# Patient Record
Sex: Male | Born: 1965 | Race: Black or African American | Hispanic: No | Marital: Married | Smoking: Current every day smoker
Health system: Southern US, Community
[De-identification: ages and names within clinical notes are randomized; demographics above are authoritative.]

## PROBLEM LIST (undated history)

## (undated) DIAGNOSIS — E7800 Pure hypercholesterolemia, unspecified: Secondary | ICD-10-CM

## (undated) DIAGNOSIS — M87059 Idiopathic aseptic necrosis of unspecified femur: Secondary | ICD-10-CM

## (undated) DIAGNOSIS — R0602 Shortness of breath: Secondary | ICD-10-CM

## (undated) DIAGNOSIS — M069 Rheumatoid arthritis, unspecified: Secondary | ICD-10-CM

## (undated) DIAGNOSIS — I319 Disease of pericardium, unspecified: Secondary | ICD-10-CM

## (undated) DIAGNOSIS — I10 Essential (primary) hypertension: Secondary | ICD-10-CM

## (undated) DIAGNOSIS — A419 Sepsis, unspecified organism: Secondary | ICD-10-CM

## (undated) DIAGNOSIS — J189 Pneumonia, unspecified organism: Secondary | ICD-10-CM

## (undated) HISTORY — DX: Sepsis, unspecified organism: A41.9

## (undated) HISTORY — PX: APPENDECTOMY: SHX54

## (undated) HISTORY — DX: Disease of pericardium, unspecified: I31.9

## (undated) HISTORY — DX: Pure hypercholesterolemia, unspecified: E78.00

## (undated) HISTORY — PX: JOINT REPLACEMENT: SHX530

## (undated) HISTORY — PX: TONSILLECTOMY: SUR1361

## (undated) HISTORY — PX: BACK SURGERY: SHX140

---

## 1999-03-14 ENCOUNTER — Encounter: Payer: Self-pay | Admitting: Emergency Medicine

## 1999-03-14 ENCOUNTER — Emergency Department (HOSPITAL_COMMUNITY): Admission: EM | Admit: 1999-03-14 | Discharge: 1999-03-14 | Payer: Self-pay | Admitting: Emergency Medicine

## 2005-04-05 ENCOUNTER — Encounter: Admission: RE | Admit: 2005-04-05 | Discharge: 2005-04-05 | Payer: Self-pay | Admitting: Occupational Medicine

## 2005-06-29 ENCOUNTER — Emergency Department (HOSPITAL_COMMUNITY): Admission: EM | Admit: 2005-06-29 | Discharge: 2005-06-29 | Payer: Self-pay | Admitting: Family Medicine

## 2006-05-28 ENCOUNTER — Emergency Department (HOSPITAL_COMMUNITY): Admission: EM | Admit: 2006-05-28 | Discharge: 2006-05-28 | Payer: Self-pay | Admitting: Emergency Medicine

## 2006-09-07 ENCOUNTER — Emergency Department (HOSPITAL_COMMUNITY): Admission: EM | Admit: 2006-09-07 | Discharge: 2006-09-07 | Payer: Self-pay | Admitting: Family Medicine

## 2006-11-30 ENCOUNTER — Emergency Department (HOSPITAL_COMMUNITY): Admission: EM | Admit: 2006-11-30 | Discharge: 2006-11-30 | Payer: Self-pay | Admitting: Family Medicine

## 2007-01-01 ENCOUNTER — Emergency Department (HOSPITAL_COMMUNITY): Admission: EM | Admit: 2007-01-01 | Discharge: 2007-01-01 | Payer: Self-pay | Admitting: *Deleted

## 2007-05-11 ENCOUNTER — Encounter: Admission: RE | Admit: 2007-05-11 | Discharge: 2007-06-19 | Payer: Self-pay | Admitting: Occupational Medicine

## 2007-11-14 ENCOUNTER — Emergency Department (HOSPITAL_COMMUNITY): Admission: EM | Admit: 2007-11-14 | Discharge: 2007-11-14 | Payer: Self-pay | Admitting: Emergency Medicine

## 2007-12-25 ENCOUNTER — Emergency Department (HOSPITAL_COMMUNITY): Admission: EM | Admit: 2007-12-25 | Discharge: 2007-12-25 | Payer: Self-pay | Admitting: Emergency Medicine

## 2007-12-29 ENCOUNTER — Emergency Department (HOSPITAL_COMMUNITY): Admission: EM | Admit: 2007-12-29 | Discharge: 2007-12-29 | Payer: Self-pay | Admitting: Emergency Medicine

## 2008-04-10 ENCOUNTER — Emergency Department (HOSPITAL_COMMUNITY): Admission: EM | Admit: 2008-04-10 | Discharge: 2008-04-10 | Payer: Self-pay | Admitting: Emergency Medicine

## 2008-08-12 ENCOUNTER — Emergency Department (HOSPITAL_COMMUNITY): Admission: EM | Admit: 2008-08-12 | Discharge: 2008-08-12 | Payer: Self-pay | Admitting: Emergency Medicine

## 2008-11-22 HISTORY — PX: CARDIAC CATHETERIZATION: SHX172

## 2008-12-15 ENCOUNTER — Ambulatory Visit (HOSPITAL_COMMUNITY): Admission: RE | Admit: 2008-12-15 | Discharge: 2008-12-15 | Payer: Self-pay | Admitting: Cardiovascular Disease

## 2009-05-05 ENCOUNTER — Emergency Department (HOSPITAL_COMMUNITY): Admission: EM | Admit: 2009-05-05 | Discharge: 2009-05-05 | Payer: Self-pay | Admitting: Emergency Medicine

## 2009-08-29 ENCOUNTER — Emergency Department (HOSPITAL_COMMUNITY): Admission: EM | Admit: 2009-08-29 | Discharge: 2009-08-29 | Payer: Self-pay | Admitting: Emergency Medicine

## 2009-12-17 ENCOUNTER — Emergency Department (HOSPITAL_COMMUNITY): Admission: EM | Admit: 2009-12-17 | Discharge: 2009-12-17 | Payer: Self-pay | Admitting: Emergency Medicine

## 2010-10-13 LAB — RAPID STREP SCREEN (MED CTR MEBANE ONLY): Streptococcus, Group A Screen (Direct): NEGATIVE

## 2010-10-25 ENCOUNTER — Encounter: Payer: Self-pay | Admitting: Cardiovascular Disease

## 2010-10-26 ENCOUNTER — Ambulatory Visit: Payer: Self-pay | Admitting: Cardiovascular Disease

## 2010-12-07 NOTE — Cardiovascular Report (Signed)
NAMEMarland Kitchen  AZIM, GILLINGHAM NO.:  [id number]   MEDICAL RECORD NO.:  [id number]          PATIENT TYPE:  OIB   LOCATION:  2899                         FACILITY:  [facility]   PHYSICIAN:  Vesta Mixer, M.D. DATE OF BIRTH:  [birth year]   DATE OF PROCEDURE:  12/15/2008  DATE OF DISCHARGE:  12/15/2008                            CARDIAC CATHETERIZATION   Dennis Harris is a 45 year old gentleman with a history of pericarditis.  He  presents with symptoms of chest pain consistent with unstable angina.  Because of his persistent chest pains, he is scheduled for heart  catheterization.   The procedure was left heart catheterization with coronary angiography.   The right femoral artery was easily cannulated using a modified  Seldinger technique.   HEMODYNAMICS:  LV pressure is 123/2 with an aortic pressure of 127/75.   ANGIOGRAPHY:  Left main.  The left main is smooth and normal.   The left descending artery is smooth and normal.  There is a large first  diagonal artery which is normal.  There are several small diagonal  arteries which are normal.   The left circumflex artery is a moderate-sized branch.  It gives off two  high obtuse marginal arteries which are normal.  The distal circumflex  artery is normal.   The right coronary artery is moderate in size.  There is a mid 10-20%  irregularity.  The remaining RCA is normal.  The posterior descending  artery and the posterolateral segment artery are normal.   The left ventriculogram was performed in the 30-degree RAO position.  It  reveals normal left ventricular systolic function.  There are no  segmental wall motion abnormalities.   COMPLICATIONS:  None.   CONCLUSIONS:  1. Minimal coronary artery disease.  2. Normal left ventricular systolic function.  We will continue with      medical therapy.  We will try him on some Prevacid.      Vesta Mixer, M.D.  Electronically Signed     PJN/MEDQ  D:  12/15/2008  T:   12/16/2008  Job:  [id number]

## 2010-12-07 NOTE — H&P (Signed)
NAME:  JOSEF, TOURIGNY NO.:  [id number]   MEDICAL RECORD NO.:  [id number]          PATIENT TYPE:  OIB   LOCATION:                               FACILITY:  [facility]   PHYSICIAN:  Vesta Mixer, M.D. DATE OF BIRTH:  [birth year]   DATE OF ADMISSION:  12/15/2008  DATE OF DISCHARGE:                              HISTORY & PHYSICAL   Dennis Harris is a 45 year old gentleman with a history of chest  pains.  He was diagnosed as having pericarditis a month or so ago.  He  presents now with continued chest pain and is now referred for heart  catheterization for further symptoms.   Dennis Harris is a relatively healthy white gentleman.  He was seen several  months ago and was diagnosed to have pericarditis.  Initially, he had  relief with nonsteroidal anti-inflammatory agents.  Starting about a  week ago, he developed recurrent episodes of chest pain.  These are  described as a chest pressure.  It radiates through to his back.  It is  associated with some nausea and some shortness of breath.  There is no  diaphoresis.  He denies any syncope or presyncope.  He denies any PND or  orthopnea.  He just states that he is just not feeling well at all.  He  has not been able to work all that much because of this chest pain.   The patient denies any blood in his urine, blood in his stool.  He  denies any indigestion-like symptoms.  There is no association with  eating, drinking, change in position, taking a deep breath, or exercise.  Specifically, he denies any pleuritic component to this chest pain.   CURRENT MEDICATIONS:  1. Multivitamin once a day.  2. Aleve as needed.  3. Fish oil once a day.   ALLERGIES:  None.   PAST MEDICAL HISTORY:  1. Chest pain - diagnosed as pericarditis a month or so ago.  2. Hypercholesterolemia.   FAMILY HISTORY:  His father died in 34s due to cirrhosis.  His mother is  39, is old, and has a history of CVA and hypertension as well as  diabetes  mellitus.   SOCIAL HISTORY:  The patient smokes about a pack of cigarettes a day.  He does not drink alcohol.   His review of systems is noted in the HPI.  All other systems were  reviewed and are negative.   PHYSICAL EXAMINATION:  GENERAL:  He is a middle-aged gentleman in no  acute distress.  He is alert and oriented x3, and his mood and affect  are normal.  VITAL SIGNS:  His weight is 166.  His blood pressure 144/84 with a heart  rate of 80.  HEENT:  His sclerae are nonicteric.  His mucous membranes are moist.  His pupils are equally round and reactive to light.  His carotids are 2+  without bruits.  There is no JVD.  His lungs are clear.  NECK:  Supple.  HEART:  Regular rate.  S1 and S2.  His PMI is nondisplaced.  There are  no murmurs.  ABDOMEN:  Good bowel sounds.  There is no hepatosplenomegaly.  There is  no guarding or rebound.  There is no tenderness.  EXTREMITIES:  He has 2+ pulses.  There is no edema.  There are no  rashes.  There are no palpable cords.  His gait is normal.  NEUROLOGIC:  Cranial nerves II through XII are intact, and his motor and  sensory function are intact.   Laboratory data was drawn in order to do the heart catheterization.  These results are pending.   Most recent echo reveals normal left ventricular function.  There is no  evidence of pericardial thickening, and there is no pericardial  effusion.   IMPRESSION:  Dennis Harris presents with recurrent episodes of chest pain,  shortness of breath with radiation through to his back.  These symptoms  are somewhat concerning for unstable angina.  We have scheduled him for  a heart catheterization on Monday.  I have given him a prescription for  nitroglycerin which he can take on an as-needed basis.  I have told him  to call the ER or come to the ER emergently if he has recurrent chest  pains that do not resolved.  We will see him back in the office in a  week or so following his cath.      Vesta Mixer, M.D.  Electronically Signed     PJN/MEDQ  D:  12/12/2008  T:  12/13/2008  Job:  [id number]

## 2010-12-23 ENCOUNTER — Ambulatory Visit: Payer: Self-pay | Admitting: Cardiovascular Disease

## 2010-12-25 ENCOUNTER — Emergency Department (HOSPITAL_COMMUNITY)
Admission: EM | Admit: 2010-12-25 | Discharge: 2010-12-25 | Disposition: A | Discharge status: Home / Self Care | Payer: Managed Care, Other (non HMO) | Attending: Emergency Medicine | Admitting: Emergency Medicine

## 2010-12-25 DIAGNOSIS — M719 Bursopathy, unspecified: Secondary | ICD-10-CM | POA: Insufficient documentation

## 2010-12-25 DIAGNOSIS — M25519 Pain in unspecified shoulder: Secondary | ICD-10-CM | POA: Insufficient documentation

## 2010-12-25 DIAGNOSIS — M67919 Unspecified disorder of synovium and tendon, unspecified shoulder: Secondary | ICD-10-CM | POA: Insufficient documentation

## 2011-03-27 ENCOUNTER — Emergency Department (HOSPITAL_COMMUNITY)
Admission: EM | Admit: 2011-03-27 | Discharge: 2011-03-27 | Disposition: A | Discharge status: Home / Self Care | Payer: Managed Care, Other (non HMO) | Attending: Emergency Medicine | Admitting: Emergency Medicine

## 2011-03-27 DIAGNOSIS — M25549 Pain in joints of unspecified hand: Secondary | ICD-10-CM | POA: Insufficient documentation

## 2011-03-27 DIAGNOSIS — M25569 Pain in unspecified knee: Secondary | ICD-10-CM | POA: Insufficient documentation

## 2011-04-11 ENCOUNTER — Telehealth: Payer: Self-pay | Admitting: Cardiovascular Disease

## 2011-04-11 NOTE — Telephone Encounter (Signed)
Faxed over last OV Notes as well as EKG's, ECHO's, and Stress Tests done at [facility].  Faxed all available info today.

## 2011-04-11 NOTE — Telephone Encounter (Signed)
Received Auth to Release Med Rec Info to [employer].  Please fax available records for past 3 years.

## 2012-01-03 ENCOUNTER — Encounter (HOSPITAL_COMMUNITY): Payer: Self-pay | Admitting: Emergency Medicine

## 2012-01-03 ENCOUNTER — Emergency Department (HOSPITAL_COMMUNITY): Payer: Managed Care, Other (non HMO)

## 2012-01-03 ENCOUNTER — Emergency Department (HOSPITAL_COMMUNITY)
Admission: EM | Admit: 2012-01-03 | Discharge: 2012-01-04 | Disposition: A | Discharge status: Home / Self Care | Payer: Managed Care, Other (non HMO) | Attending: Emergency Medicine | Admitting: Emergency Medicine

## 2012-01-03 DIAGNOSIS — R209 Unspecified disturbances of skin sensation: Secondary | ICD-10-CM | POA: Insufficient documentation

## 2012-01-03 DIAGNOSIS — R590 Localized enlarged lymph nodes: Secondary | ICD-10-CM

## 2012-01-03 DIAGNOSIS — R42 Dizziness and giddiness: Secondary | ICD-10-CM | POA: Insufficient documentation

## 2012-01-03 DIAGNOSIS — M25519 Pain in unspecified shoulder: Secondary | ICD-10-CM | POA: Insufficient documentation

## 2012-01-03 DIAGNOSIS — M25511 Pain in right shoulder: Secondary | ICD-10-CM

## 2012-01-03 DIAGNOSIS — E7800 Pure hypercholesterolemia, unspecified: Secondary | ICD-10-CM | POA: Insufficient documentation

## 2012-01-03 DIAGNOSIS — R0989 Other specified symptoms and signs involving the circulatory and respiratory systems: Secondary | ICD-10-CM | POA: Insufficient documentation

## 2012-01-03 DIAGNOSIS — F17200 Nicotine dependence, unspecified, uncomplicated: Secondary | ICD-10-CM | POA: Insufficient documentation

## 2012-01-03 DIAGNOSIS — R599 Enlarged lymph nodes, unspecified: Secondary | ICD-10-CM | POA: Insufficient documentation

## 2012-01-03 DIAGNOSIS — R0600 Dyspnea, unspecified: Secondary | ICD-10-CM

## 2012-01-03 DIAGNOSIS — R079 Chest pain, unspecified: Secondary | ICD-10-CM | POA: Insufficient documentation

## 2012-01-03 DIAGNOSIS — R0609 Other forms of dyspnea: Secondary | ICD-10-CM | POA: Insufficient documentation

## 2012-01-03 LAB — COMPREHENSIVE METABOLIC PANEL
ALT: 25 U/L (ref 0–53)
AST: 20 U/L (ref 0–37)
Calcium: 9.3 mg/dL (ref 8.4–10.5)
Creatinine, Ser: 1.02 mg/dL (ref 0.50–1.35)
GFR calc Af Amer: >90 mL/min (ref >90)
Glucose, Bld: 92 mg/dL (ref 70–99)
Sodium: 140 mEq/L (ref 135–145)
Total Protein: 7.8 g/dL (ref 6.0–8.3)

## 2012-01-03 LAB — CBC
HCT: 39.1 % (ref 39.0–52.0)
Hemoglobin: 12.1 g/dL — ABNORMAL LOW (ref 13.0–17.0)
MCH: 24.9 pg — ABNORMAL LOW (ref 26.0–34.0)
MCV: 80.6 fL (ref 78.0–100.0)
WBC: 10.9 10*3/uL — ABNORMAL HIGH (ref 4.0–10.5)

## 2012-01-03 LAB — DIFFERENTIAL
Basophils Absolute: 0 10*3/uL (ref 0.0–0.1)
Basophils Relative: 0 % (ref 0–1)
Eosinophils Absolute: 0 10*3/uL (ref 0.0–0.7)
Eosinophils Relative: 0 % (ref 0–5)
Metamyelocytes Relative: 0 %
Monocytes Absolute: 0.4 10*3/uL (ref 0.1–1.0)
Monocytes Relative: 4 % (ref 3–12)
Myelocytes: 0 %

## 2012-01-03 LAB — POCT I-STAT TROPONIN I: Troponin i, poc: 0 ng/mL (ref 0.00–0.08)

## 2012-01-03 MED ORDER — MORPHINE SULFATE 4 MG/ML IJ SOLN
8.0000 mg | Freq: Once | INTRAMUSCULAR | Status: AC
  Administered 2012-01-03: 8 mg via INTRAVENOUS
  Filled 2012-01-03: qty 2

## 2012-01-03 MED ORDER — OXYCODONE-ACETAMINOPHEN 5-325 MG PO TABS
1.0000 | ORAL_TABLET | Freq: Once | ORAL | Status: AC
  Administered 2012-01-03: 1 via ORAL
  Filled 2012-01-03: qty 1

## 2012-01-03 MED ORDER — ONDANSETRON HCL 4 MG/2ML IJ SOLN
4.0000 mg | Freq: Once | INTRAMUSCULAR | Status: AC
  Administered 2012-01-03: 4 mg via INTRAVENOUS
  Filled 2012-01-03: qty 2

## 2012-01-03 NOTE — ED Provider Notes (Signed)
History     CSN: [id number]  Arrival date & time 01/03/12  2016   First MD Initiated Contact with Patient 01/03/12 2017      Chief Complaint  Patient presents with  . Chest Pain    (Consider location/radiation/quality/duration/timing/severity/associated sxs/prior treatment) HPI Comments: PT is a 46yo male who presents to ER by EMS complaining of pain in right shoulder, chest, dizziness, SOB. Stats noted pain in right shoulder yesterday, worse with movement. States has history of RA, on prednisone, attributed his pain to that. States today, developed chest pain retrosternal, and shortness of breath. Pt states he went in to work, but became dizzy, had difficulty focusing his vision, lightheaded. Pt states he normally is able to go up 6 flights of stairs with no pain or SOB, today, any movement made him tired and short of breath. States he does not have cardiac hx other than pericarditis few years ago. States no family of cardiac problems as far as he knows. He is a smoker. Pt received asa and 3 SL nitro by ems with no improvement.    Past Medical History  Diagnosis Date  . History of chest pain   . Hypercholesteremia   . Depression   . Pericarditis     Past Surgical History  Procedure Date  . Cardiac catheterization May 2010    normal  . Tonsillectomy   . Appendectomy     Family History  Problem Relation Age of Onset  . Cirrhosis Father   . Hypertension Mother   . Stroke Mother   . Diabetes Mother   . Lupus Sister   . Lupus Sister     History  Substance Use Topics  . Smoking status: Current Everyday Smoker -- 0.5 packs/day for 22 years    Types: Cigarettes  . Smokeless tobacco: Not on file  . Alcohol Use: 4.2 oz/week    7 Cans of beer per week      Review of Systems  Constitutional: Negative for fever, chills and diaphoresis.  HENT: Positive for neck pain. Negative for neck stiffness.   Eyes: Positive for visual disturbance.  Respiratory: Positive for chest  tightness and shortness of breath. Negative for cough, wheezing and stridor.   Cardiovascular: Positive for chest pain. Negative for palpitations and leg swelling.  Gastrointestinal: Negative for nausea, vomiting and abdominal pain.  Neurological: Positive for dizziness, weakness and light-headedness. Negative for syncope, speech difficulty, numbness and headaches.    Allergies  Review of patient's allergies indicates no known allergies.  Home Medications   Current Outpatient Rx  Name Route Sig Dispense Refill  . BC FAST PAIN RELIEF ARTHRITIS PO Oral Take 1 packet by mouth 2 (two) times daily as needed. For pain    . ONE-DAILY MULTI VITAMINS PO TABS Oral Take 1 tablet by mouth daily.      Marland Kitchen PREDNISONE 5 MG PO TABS Oral Take 5 mg by mouth daily.    Marland Kitchen NITROGLYCERIN 0.4 MG SL SUBL Sublingual Place 0.4 mg under the tongue every 5 (five) minutes as needed. For chest pain      There were no vitals taken for this visit.  Physical Exam  Nursing note and vitals reviewed. Constitutional: He is oriented to person, place, and time. He appears well-developed and well-nourished. No distress.  HENT:  Head: Normocephalic and atraumatic.  Eyes: Conjunctivae and EOM are normal. Pupils are equal, round, and reactive to light.  Neck: Neck supple.  Cardiovascular: Normal rate, regular rhythm and normal heart sounds.  Pulmonary/Chest: Effort normal and breath sounds normal. No respiratory distress. He has no wheezes. He has no rales. He exhibits no tenderness.  Abdominal: Soft. Bowel sounds are normal. He exhibits no distension. There is no rebound.  Musculoskeletal: He exhibits no edema and no tenderness.       Right shoulder normal appearing. Tender over anterior and posterior joints. Pain with ROM. Limited ROM of right shoulder due to pain  Neurological: He is alert and oriented to person, place, and time.  Skin: Skin is warm and dry.  Psychiatric: He has a normal mood and affect.    ED Course    Procedures (including critical care time)   Date: 01/04/2012  Rate: 93  Rhythm: normal sinus rhythm  QRS Axis: normal  Intervals: normal  ST/T Wave abnormalities: normal  Conduction Disutrbances:none  Narrative Interpretation:   Old EKG Reviewed: unchanged  Pt seen adn examined, chest pain, dizziness. Labs and CXR ordered. Pain medications ordered. Suspect shoulder pain possibly due to RA. Pt received 3 SL nitro per EMS and 321mg  asa with no improvement in his pain.  Results for orders placed during the hospital encounter of 01/03/12  CBC      Component Value Range   WBC 10.9 (*) 4.0 - 10.5 K/uL   RBC 4.85  4.22 - 5.81 MIL/uL   Hemoglobin 12.1 (*) 13.0 - 17.0 g/dL   HCT 16.1  09.6 - 04.5 %   MCV 80.6  78.0 - 100.0 fL   MCH 24.9 (*) 26.0 - 34.0 pg   MCHC 30.9  30.0 - 36.0 g/dL   RDW 40.9  81.1 - 91.4 %   Platelets 315  150 - 400 K/uL  DIFFERENTIAL      Component Value Range   Neutrophils Relative 46  43 - 77 %   Lymphocytes Relative 49 (*) 12 - 46 %   Monocytes Relative 4  3 - 12 %   Eosinophils Relative 0  0 - 5 %   Basophils Relative 0  0 - 1 %   Band Neutrophils 1  0 - 10 %   Metamyelocytes Relative 0     Myelocytes 0     Promyelocytes Absolute 0     Blasts 0     nRBC 0  0 /100 WBC   Neutro Abs 5.1  1.7 - 7.7 K/uL   Lymphs Abs 5.4 (*) 0.7 - 4.0 K/uL   Monocytes Absolute 0.4  0.1 - 1.0 K/uL   Eosinophils Absolute 0.0  0.0 - 0.7 K/uL   Basophils Absolute 0.0  0.0 - 0.1 K/uL   RBC Morphology TARGET CELLS     Smear Review LARGE PLATELETS PRESENT    COMPREHENSIVE METABOLIC PANEL      Component Value Range   Sodium 140  135 - 145 mEq/L   Potassium 3.4 (*) 3.5 - 5.1 mEq/L   Chloride 105  96 - 112 mEq/L   CO2 21  19 - 32 mEq/L   Glucose, Bld 92  70 - 99 mg/dL   BUN 14  6 - 23 mg/dL   Creatinine, Ser 7.82  0.50 - 1.35 mg/dL   Calcium 9.3  8.4 - 95.6 mg/dL   Total Protein 7.8  6.0 - 8.3 g/dL   Albumin 3.4 (*) 3.5 - 5.2 g/dL   AST 20  0 - 37 U/L   ALT 25  0 - 53  U/L   Alkaline Phosphatase 75  39 - 117 U/L   Total Bilirubin 0.2 (*)  0.3 - 1.2 mg/dL   GFR calc non Af Amer 86 (*) >90 mL/min   GFR calc Af Amer >90  >90 mL/min  D-DIMER, QUANTITATIVE      Component Value Range   D-Dimer, Quant >20.00 (*) 0.00 - 0.48 ug/mL-FEU  POCT I-STAT TROPONIN I      Component Value Range   Troponin i, poc 0.00  0.00 - 0.08 ng/mL   Comment 3            Dg Chest 2 View  01/03/2012  *RADIOLOGY REPORT*  Clinical Data: Right shoulder pain and numbness.  Left chest pain.  CHEST - 2 VIEW  Comparison: 04/25/2011  Findings: Numerous leads and wires project over the chest.  Midline trachea.  Normal heart size and mediastinal contours. No pleural effusion or pneumothorax.  Diffuse peribronchial thickening.  Clear lungs.  IMPRESSION: 1.  No acute cardiopulmonary disease. 2.  Mild peribronchial thickening which may relate to chronic bronchitis or smoking.  Original Report Authenticated By: Consuello Bossier, M.D.    Pt's D dimer is >20, will get CT angio. Discussed with Dr. Norlene Campbell who saw pt as well, agrees with testing. Pt signed out to her at change of shift. CT A pending. Pt pain improved with medications.         1. Right shoulder pain   2. Dyspnea   3. Lymphadenopathy, mediastinal       MDM         Lottie Mussel, PA 01/05/12 0134  Lottie Mussel, PA 01/05/12 [phone]

## 2012-01-03 NOTE — ED Notes (Signed)
PER EMS- Pt states he started having shoulder pain last night. Patient then started having pain in his neck and chest. History of pericardits. Alertx4, NAD. Some SOB at rest. Pt states he was light headed. No change in chest pain with NTG.

## 2012-01-04 ENCOUNTER — Emergency Department (HOSPITAL_COMMUNITY): Payer: Managed Care, Other (non HMO)

## 2012-01-04 ENCOUNTER — Encounter (HOSPITAL_COMMUNITY): Payer: Self-pay | Admitting: Radiology

## 2012-01-04 MED ORDER — HYDROMORPHONE HCL PF 1 MG/ML IJ SOLN
INTRAMUSCULAR | Status: AC
  Administered 2012-01-04: 1 mg
  Filled 2012-01-04: qty 1

## 2012-01-04 MED ORDER — DIAZEPAM 5 MG PO TABS: 5.0000 mg | ORAL_TABLET | Freq: Three times a day (TID) | ORAL | Status: AC | PRN

## 2012-01-04 MED ORDER — IOHEXOL 350 MG/ML SOLN
100.0000 mL | Freq: Once | INTRAVENOUS | Status: AC | PRN
  Administered 2012-01-04: 100 mL via INTRAVENOUS

## 2012-01-04 MED ORDER — OXYCODONE-ACETAMINOPHEN 5-325 MG PO TABS: 2.0000 | ORAL_TABLET | ORAL | Status: AC | PRN

## 2012-01-04 MED ORDER — DIAZEPAM 5 MG PO TABS
ORAL_TABLET | ORAL | Status: AC
  Administered 2012-01-04: 5 mg
  Filled 2012-01-04: qty 1

## 2012-01-04 NOTE — Discharge Instructions (Signed)
Your CT scan today did not show any signs of blood clots.  There was some increased lymph nodes and soft tissue enlargement.  Please follow up with your doctor in 1-2 weeks for recheck of this.  Please work on smoking cessation.  Take pain medications as prescribed.  Arthralgia Your caregiver has diagnosed you as suffering from an arthralgia. Arthralgia means there is pain in a joint. This can come from many reasons including:  Bruising the joint which causes soreness (inflammation) in the joint.   Wear and tear on the joints which occur as we grow older (osteoarthritis).   Overusing the joint.   Various forms of arthritis.   Infections of the joint.  Regardless of the cause of pain in your joint, most of these different pains respond to anti-inflammatory drugs and rest. The exception to this is when a joint is infected, and these cases are treated with antibiotics, if it is a bacterial infection. HOME CARE INSTRUCTIONS   Rest the injured area for as long as directed by your caregiver.  For the first 24 hours, keep the injured extremity elevated on pillows while lying down.   Apply ice for 15 to 20 minutes to the sore joint every couple hours while awake for the first half day. Then 3 to 4 times per day for the first 48 hours. Put the ice in a plastic bag and place a towel between the bag of ice and your skin.   Wear any splinting, casting, elastic bandage applications, or slings as instructed.   Only take over-the-counter or prescription medicines for pain, discomfort, or fever as directed by your caregiver. Do not use aspirin immediately after the injury unless instructed by your physician. Aspirin can cause increased bleeding and bruising of the tissues.  Persistent pain and inability to use the sore joint as directed for more than 2 to 3 days are warning signs indicating that you should see a caregiver for a follow-up visit as soon as possible. Initially, a hairline fracture (break in  bone) may not be evident on X-rays. Persistent pain and swelling indicate that further evaluation, non-weight bearing or use of the joint (use of crutches or slings as instructed), or further X-rays are indicated. X-rays may sometimes not show a small fracture until a week or 10 days later. Make a follow-up appointment with your own caregiver or one to whom we have referred you. A radiologist (specialist in reading X-rays) may read your X-rays. Make sure you know how you are to obtain your X-ray results. Do not assume everything is normal if you do not hear from [place]. SEEK MEDICAL CARE IF: Bruising, swelling, or pain increases. SEEK IMMEDIATE MEDICAL CARE IF:   Your fingers or toes are numb or blue.   The pain is not responding to medications and continues to stay the same or get worse.   The pain in your joint becomes severe.   You develop a fever over 102 F (38.9 C).   It becomes impossible to move or use the joint.  MAKE SURE YOU:   Understand these instructions.   Will watch your condition.   Will get help right away if you are not doing well or get worse.  Document Released: 07/11/2005 Document Revised: 06/30/2011 Document Reviewed: 02/27/2008 [facility] Patient Information 2012 [place], [place]. Dyspnea Shortness of breath (dyspnea) is the feeling of uneasy breathing. Dyspnea should be evaluated promptly. DIAGNOSIS  Many tests may be done to find why you are having shortness of breath.  Tests may include:  A chest X-ray.   A lung function test.   Blood tests.   Recordings of the electrical activity of the heart (electrocardiogram).   Exercise testing.   Sound wave images of the heart (a cardiac echocardiogram).   A scan.  A cause for your shortness of breath may not be identified initially. In this case, it is important to have a follow-up exam with your caregiver. HOME CARE INSTRUCTIONS   Do not smoke. Smoking is a common cause of shortness of breath. Ask for help to  stop smoking.   Avoid being around chemicals that may bother your breathing, such as paint fumes or dust.   Rest as needed. Slowly begin your usual activities.   If medications were prescribed, take them as directed for the full length of time directed. This includes oxygen and any inhaled medications, if prescribed.   It is very important that you follow up with your caregiver or other physician as directed. Waiting to do so or failure to follow up could result in worsening of your condition, possible disability, or death.   Be sure you understand what to do or who to call if your shortness of breath worsens.  SEEK MEDICAL CARE IF:   Your condition does not improve in the time expected.   You have a hard time doing your normal activities even with rest.   You have any side effects from or problems with medications prescribed.  SEEK IMMEDIATE MEDICAL CARE IF:   You feel your shortness of breath is getting worse.   You feel lightheaded, faint or develop a cough not controlled with medications.   You start coughing up blood.   You get pain with breathing.   You get chest pain or pain in your arms, shoulders or belly (abdomen).   You have a fever.   You are unable to walk up stairs or exercise the way you normally can.  MAKE SURE YOU:   Understand these instructions.   Will watch your condition.   Will get help right away if you are not doing well or get worse.  Document Released: 08/18/2004 Document Revised: 03/23/2011 Document Reviewed: 11/26/2009 [facility] Patient Information 2012 [place], [place].  Pain of Unknown Etiology (Pain Without a Known Cause) You have come to your caregiver because of pain. Pain can occur in any part of the body. Often there is not a definite cause. If your laboratory (blood or urine) work was normal and x-rays or other studies were normal, your caregiver may treat you without knowing the cause of the pain. An example of this is the headache. Most  headaches are diagnosed by taking a history. This means your caregiver asks you questions about your headaches. Your caregiver determines a treatment based on your answers. Usually testing done for headaches is normal. Often testing is not done unless there is no response to medications. Regardless of where your pain is located today, you can be given medications to make you comfortable. If no physical cause of pain can be found, most cases of pain will gradually leave as suddenly as they came.  If you have a painful condition and no reason can be found for the pain, It is importantthat you follow up with your caregiver. If the pain becomes worse or does not go away, it may be necessary to repeat tests and look further for a possible cause.  Only take over-the-counter or prescription medicines for pain, discomfort, or fever as directed by your  caregiver.   For the protection of your privacy, test results can not be given over the phone. Make sure you receive the results of your test. Ask as to how these results are to be obtained if you have not been informed. It is your responsibility to obtain your test results.   You may continue all activities unless the activities cause more pain. When the pain lessens, it is important to gradually resume normal activities. Resume activities by beginning slowly and gradually increasing the intensity and duration of the activities or exercise. During periods of severe pain, bed-rest may be helpful. Lay or sit in any position that is comfortable.   Ice used for acute (sudden) conditions may be effective. Use a large plastic bag filled with ice and wrapped in a towel. This may provide pain relief.   See your caregiver for continued problems. They can help or refer you for exercises or physical therapy if necessary.  If you were given medications for your condition, do not drive, operate machinery or power tools, or sign legal documents for 24 hours. Do not drink  alcohol, take sleeping pills, or take other medications that may interfere with treatment. See your caregiver immediately if you have pain that is becoming worse and not relieved by medications. Document Released: 04/05/2001 Document Revised: 06/30/2011 Document Reviewed: 07/11/2005 [facility] Patient Information 2012 [place], [place].Shoulder Pain The shoulder is a ball and socket joint. The muscles and tendons (rotator cuff) are what keep the shoulder in its joint and stable. This collection of muscles and tendons holds in the head (ball) of the humerus (upper arm bone) in the fossa (cup) of the scapula (shoulder blade). Today no reason was found for your shoulder pain. Often pain in the shoulder may be treated conservatively with temporary immobilization. For example, holding the shoulder in one place using a sling for rest. Physical therapy may be needed if problems continue. HOME CARE INSTRUCTIONS   Apply ice to the sore area for 15 to 20 minutes, 3 to 4 times per day for the first 2 days. Put the ice in a plastic bag. Place a towel between the bag of ice and your skin.   If you have or were given a shoulder sling and straps, do not remove for as long as directed by your caregiver or until you see a caregiver for a follow-up examination. If you need to remove it to shower or bathe, move your arm as little as possible.   Sleep on several pillows at night to lessen swelling and pain.   Only take over-the-counter or prescription medicines for pain, discomfort, or fever as directed by your caregiver.   Keep any follow-up appointments in order to avoid any type of permanent shoulder disability or chronic pain problems.  SEEK MEDICAL CARE IF:   Pain in your shoulder increases or new pain develops in your arm, hand, or fingers.   Your hand or fingers are colder than your other hand.   You do not obtain pain relief with the medications or your pain becomes worse.  SEEK IMMEDIATE MEDICAL CARE IF:    Your arm, hand, or fingers are numb or tingling.   Your arm, hand, or fingers are swollen, painful, or turn white or blue.   You develop chest pain or shortness of breath.  MAKE SURE YOU:   Understand these instructions.   Will watch your condition.   Will get help right away if you are not doing well or get worse.  Document Released: 04/20/2005 Document Revised: 06/30/2011 Document Reviewed: 06/25/2011 [facility] Patient Information 2012 [place], [place].

## 2012-01-04 NOTE — ED Provider Notes (Signed)
CT scan without PE.  Some reactive nodes, bronchitic changes on cxr.  Pt is heavy smoker.  Will have patient f/u with pcm, Dr Nehemiah Settle for recheck in 2 weeks.  Pt feeling better after pain medication, but still with some shoulder pain.  Olivia Mackie, MD 01/04/12 [phone]

## 2012-01-06 NOTE — ED Provider Notes (Signed)
Medical screening examination/treatment/procedure(s) were performed by non-physician practitioner and as supervising physician I was immediately available for consultation/collaboration.  Juliet Rude. Rubin Payor, MD 01/06/12 1610

## 2012-10-11 ENCOUNTER — Encounter: Payer: Self-pay | Admitting: Cardiovascular Disease

## 2012-11-05 ENCOUNTER — Encounter (HOSPITAL_COMMUNITY): Payer: Self-pay | Admitting: Respiratory Therapy

## 2012-11-06 DIAGNOSIS — M47812 Spondylosis without myelopathy or radiculopathy, cervical region: Secondary | ICD-10-CM | POA: Diagnosis present

## 2012-11-06 NOTE — H&P (Signed)
History of Present Illness The patient is a 47 year old male who presents today for follow up of their neck. The patient is being followed for their central neck pain. They are 9 month(s) out from when symptoms began. Symptoms reported today include: pain (posterior cervical radiating into the right upper ext. to the level of the hand ) and numbness (right upper ext. to the level of the hand), while the patient does not report symptoms of: weakness. Current treatment includes: activity modification and pain medications. The following medication has been used for pain control: Norco. The patient presents today following MRI.  Dennis Harris returns today for followup of his MRI and for preoperative discussion.    Allergies  No Known Drug Allergies.   Social History Tobacco use. current every day smoker; smoke(d) less than 1/2 pack(s) per day   Medication History Norco (5-325MG  Tablet, 1 (one) Tablet Oral every 8 hours as needed for pain, Taken starting 10/09/2012) Active. Methotrexate ( Oral) Specific dose unknown - Active. Folic Acid ( Oral) Specific dose unknown - Active. PredniSONE (5MG  Tablet, Oral) Active.   Objective Transcription(  On clinical examination, the patient continues to have significant neck pain and right radicular arm pain. He has no shoulder, elbow, wrist pain with joint range of motion. Significant neck pain with forward flexion and extension and lateral rotation. He has four out of five biceps and first extensor strength on the right side. Deltoid, triceps grip strength is five out of five on the right. On the left, he has five out of five strength. He has numbness and dysesthesias in the C6 distribution on the right side only. Intact peripheral pulses in the upper extremities and lower extremities. Compartments are soft and nontender. No obvious skin lesions, abrasions, contusions to the anterior cervical spine.  No hip, knee or ankle pain with  joint range of motion. Negative Babinski test. Negative Hoffmann sign. One plus deep tendon reflexes throughout both the upper extremity and lower extremities.  Heart, regular rate and rhythm.  Lungs, clear to auscultation bilaterally.  Abdomen, soft and nontender.  No history of incontinence of bowel and bladder.  Normal gait pattern.   Assessments Transcription  At this point in time, the patient has had progressive debilitating neck pain and radicular right arm pain. Despite physical therapy and observation and medications his quality of life continues to deteriorate. He has significant loss in his abilities and progressive pain. We have discussed an anterior cervical diskectomy and fusion. Based on the new MRI and the patient's clinical examination I think he is having predominately C6 radiculopathy and this is confirmed by his new MRI.  See office notes for details of MRI report    I would recommend a single level anterior cervical diskectomy and fusion. This would mean making a left sided transverse incision, removing the C5-6 and implanting a titanium spacer and anterior cervical plate. The risks of the surgery include infection, bleeding, nerve damage, death, stroke, paralysis, failure to heal, need for further surgery, ongoing or worse pain, loss of bowel or bladder control, nonunion, need for posterior or recurrent anterior surgery, adjacent segment disease, throat pain, swallowing difficulties, hoarseness in the voice. My goal is for improved quality of life and diminished radicular right arm pain and improved strength. Due to the duration of these symptoms I told the patient his overall chance of improvement is about 80 percent. He is also a smoker. We have advised the patient that smoking adversely affects the fusion rate  and there is a definite need for him to stop smoking. He indicates to me he will make all appropriate steps to curtail this.  In preparation for surgery, since the  patient does have rheumatoid arthritis and he is taking methotrexate I would like him to see his rheumatologist Dr. Nickola Major so that we can ensure that there is no adverse contraindication to him stopping methotrexate. In addition, the patient has not seen his regular doctor in quite some time. I told him I think this is a good opportunity for him to see Dr. Nehemiah Settle his primary care physician so we can get appropriate preoperative medical clearance. Once we have these two documents, then we will proceed with surgery. The patient will be a 23-hour admission.

## 2012-11-06 NOTE — Pre-Procedure Instructions (Signed)
Dennis Harris  11/06/2012   Your procedure is scheduled on:  Wednesday, April 23rd.  Report to [facility] at 6:30 AM.  Call this number if you have problems the morning of surgery: [phone]   Remember:   Do not eat food or drink liquids after midnight.   Take these medicines the morning of surgery with A SIP OF WATER: None   Do not wear jewelry, make-up or nail polish.  Do not wear lotions, powders, or perfumes. You may wear deodorant.   Men may shave face and neck.  Do not bring valuables to the hospital.  Contacts, dentures or bridgework may not be worn into surgery.  Leave suitcase in the car. After surgery it may be brought to your room.  For patients admitted to the hospital, checkout time is 11:00 AM the day of discharge.   Patients discharged the day of surgery will not be allowed to drive home.  Name and phone number of your driver:-  Special Instructions: Shower using CHG 2 nights before surgery and the night before surgery.  If you shower the day of surgery use CHG.  Use special wash - you have one bottle of CHG for all showers.  You should use approximately 1/3 of the bottle for each shower.   Please read over the following fact sheets that you were given: Pain Booklet, Coughing and Deep Breathing and Surgical Site Infection Prevention

## 2012-11-07 ENCOUNTER — Encounter (HOSPITAL_COMMUNITY)
Admission: RE | Admit: 2012-11-07 | Discharge: 2012-11-07 | Disposition: A | Discharge status: Home / Self Care | Payer: Managed Care, Other (non HMO) | Source: Ambulatory Visit | Attending: Orthopedic Surgery | Admitting: Orthopedic Surgery

## 2012-11-07 ENCOUNTER — Encounter (HOSPITAL_COMMUNITY): Payer: Self-pay

## 2012-11-07 LAB — BASIC METABOLIC PANEL
CO2: 23 mEq/L (ref 19–32)
Chloride: 104 mEq/L (ref 96–112)
Glucose, Bld: 97 mg/dL (ref 70–99)
Potassium: 4.1 mEq/L (ref 3.5–5.1)
Sodium: 138 mEq/L (ref 135–145)

## 2012-11-07 LAB — CBC
Hemoglobin: 12.3 g/dL — ABNORMAL LOW (ref 13.0–17.0)
MCH: 26.1 pg (ref 26.0–34.0)
Platelets: 301 10*3/uL (ref 150–400)
RBC: 4.72 MIL/uL (ref 4.22–5.81)
WBC: 10.7 10*3/uL — ABNORMAL HIGH (ref 4.0–10.5)

## 2012-11-07 LAB — SURGICAL PCR SCREEN: MRSA, PCR: NEGATIVE

## 2012-11-07 NOTE — Progress Notes (Signed)
Patient informed Nurse that he had a EKG at Dr. Idelle Crouch office (PCP) within the last month. Will request records from [facility].

## 2012-11-13 MED ORDER — CEFAZOLIN SODIUM-DEXTROSE 2-3 GM-% IV SOLR: 2.0000 g | INTRAVENOUS | Status: DC

## 2012-11-14 ENCOUNTER — Ambulatory Visit (HOSPITAL_COMMUNITY): Payer: Managed Care, Other (non HMO) | Admitting: Anesthesiology

## 2012-11-14 ENCOUNTER — Encounter (HOSPITAL_COMMUNITY)
Admission: RE | Disposition: A | Discharge status: Home / Self Care | Payer: Self-pay | Source: Ambulatory Visit | Attending: Orthopedic Surgery

## 2012-11-14 ENCOUNTER — Ambulatory Visit (HOSPITAL_COMMUNITY): Payer: Managed Care, Other (non HMO)

## 2012-11-14 ENCOUNTER — Encounter (HOSPITAL_COMMUNITY): Payer: Self-pay | Admitting: *Deleted

## 2012-11-14 ENCOUNTER — Observation Stay (HOSPITAL_COMMUNITY)
Admission: RE | Admit: 2012-11-14 | Discharge: 2012-11-15 | Disposition: A | Discharge status: Home / Self Care | Payer: Managed Care, Other (non HMO) | Source: Ambulatory Visit | Attending: Orthopedic Surgery | Admitting: Orthopedic Surgery

## 2012-11-14 ENCOUNTER — Encounter (HOSPITAL_COMMUNITY): Payer: Self-pay | Admitting: Anesthesiology

## 2012-11-14 ENCOUNTER — Observation Stay (HOSPITAL_COMMUNITY): Payer: Managed Care, Other (non HMO)

## 2012-11-14 DIAGNOSIS — M5020 Other cervical disc displacement, unspecified cervical region: Secondary | ICD-10-CM | POA: Insufficient documentation

## 2012-11-14 DIAGNOSIS — Z01812 Encounter for preprocedural laboratory examination: Secondary | ICD-10-CM | POA: Insufficient documentation

## 2012-11-14 DIAGNOSIS — M069 Rheumatoid arthritis, unspecified: Secondary | ICD-10-CM | POA: Insufficient documentation

## 2012-11-14 DIAGNOSIS — Z01818 Encounter for other preprocedural examination: Secondary | ICD-10-CM | POA: Insufficient documentation

## 2012-11-14 DIAGNOSIS — I10 Essential (primary) hypertension: Secondary | ICD-10-CM | POA: Insufficient documentation

## 2012-11-14 DIAGNOSIS — M47812 Spondylosis without myelopathy or radiculopathy, cervical region: Principal | ICD-10-CM | POA: Insufficient documentation

## 2012-11-14 HISTORY — DX: Shortness of breath: R06.02

## 2012-11-14 HISTORY — DX: Rheumatoid arthritis, unspecified: M06.9

## 2012-11-14 HISTORY — PX: ANTERIOR CERVICAL DECOMP/DISCECTOMY FUSION: SHX1161

## 2012-11-14 SURGERY — ANTERIOR CERVICAL DECOMPRESSION/DISCECTOMY FUSION 1 LEVEL
Anesthesia: General | Site: Spine Cervical | Wound class: Clean

## 2012-11-14 MED ORDER — ONDANSETRON HCL 4 MG PO TABS: 4.0000 mg | ORAL_TABLET | Freq: Three times a day (TID) | ORAL | Status: DC | PRN

## 2012-11-14 MED ORDER — NEOSTIGMINE METHYLSULFATE 1 MG/ML IJ SOLN
INTRAMUSCULAR | Status: DC | PRN
  Administered 2012-11-14: 5 mg via INTRAVENOUS

## 2012-11-14 MED ORDER — LACTATED RINGERS IV SOLN
INTRAVENOUS | Status: DC | PRN
  Administered 2012-11-14 (×3): via INTRAVENOUS

## 2012-11-14 MED ORDER — HYDROMORPHONE HCL PF 1 MG/ML IJ SOLN
INTRAMUSCULAR | Status: AC
  Administered 2012-11-14: 0.5 mg via INTRAVENOUS
  Filled 2012-11-14: qty 1

## 2012-11-14 MED ORDER — SUCCINYLCHOLINE CHLORIDE 20 MG/ML IJ SOLN
INTRAMUSCULAR | Status: DC | PRN
  Administered 2012-11-14: 100 mg via INTRAVENOUS

## 2012-11-14 MED ORDER — FENTANYL CITRATE 0.05 MG/ML IJ SOLN
INTRAMUSCULAR | Status: DC | PRN
  Administered 2012-11-14 (×2): 100 ug via INTRAVENOUS
  Administered 2012-11-14 (×4): 50 ug via INTRAVENOUS

## 2012-11-14 MED ORDER — OXYCODONE HCL 5 MG/5ML PO SOLN: 5.0000 mg | Freq: Once | ORAL | Status: AC | PRN

## 2012-11-14 MED ORDER — MIDAZOLAM HCL 5 MG/5ML IJ SOLN
INTRAMUSCULAR | Status: DC | PRN
  Administered 2012-11-14: 2 mg via INTRAVENOUS

## 2012-11-14 MED ORDER — 0.9 % SODIUM CHLORIDE (POUR BTL) OPTIME
TOPICAL | Status: DC | PRN
  Administered 2012-11-14: 1000 mL

## 2012-11-14 MED ORDER — METHOCARBAMOL 500 MG PO TABS: 500.0000 mg | ORAL_TABLET | Freq: Three times a day (TID) | ORAL | Status: DC | PRN

## 2012-11-14 MED ORDER — ONDANSETRON HCL 4 MG/2ML IJ SOLN
INTRAMUSCULAR | Status: DC | PRN
  Administered 2012-11-14: 4 mg via INTRAVENOUS

## 2012-11-14 MED ORDER — OXYCODONE HCL 5 MG PO TABS
10.0000 mg | ORAL_TABLET | ORAL | Status: DC | PRN
  Administered 2012-11-14 – 2012-11-15 (×4): 10 mg via ORAL
  Filled 2012-11-14 (×5): qty 2

## 2012-11-14 MED ORDER — MENTHOL 3 MG MT LOZG: 1.0000 | LOZENGE | OROMUCOSAL | Status: DC | PRN

## 2012-11-14 MED ORDER — PHENOL 1.4 % MT LIQD: 1.0000 | OROMUCOSAL | Status: DC | PRN

## 2012-11-14 MED ORDER — LIDOCAINE HCL (CARDIAC) 20 MG/ML IV SOLN
INTRAVENOUS | Status: DC | PRN
  Administered 2012-11-14: 50 mg via INTRAVENOUS
  Administered 2012-11-14: 80 mg via INTRAVENOUS

## 2012-11-14 MED ORDER — GLYCOPYRROLATE 0.2 MG/ML IJ SOLN
INTRAMUSCULAR | Status: DC | PRN
  Administered 2012-11-14: 0.6 mg via INTRAVENOUS

## 2012-11-14 MED ORDER — SODIUM CHLORIDE 0.9 % IJ SOLN
3.0000 mL | Freq: Two times a day (BID) | INTRAMUSCULAR | Status: DC
  Administered 2012-11-14: 3 mL via INTRAVENOUS

## 2012-11-14 MED ORDER — VECURONIUM BROMIDE 10 MG IV SOLR
INTRAVENOUS | Status: DC | PRN
  Administered 2012-11-14: 1 mg via INTRAVENOUS
  Administered 2012-11-14: 6 mg via INTRAVENOUS
  Administered 2012-11-14: 2 mg via INTRAVENOUS

## 2012-11-14 MED ORDER — THROMBIN 20000 UNITS EX SOLR
CUTANEOUS | Status: AC
  Filled 2012-11-14: qty 20000

## 2012-11-14 MED ORDER — ZOLPIDEM TARTRATE 5 MG PO TABS
5.0000 mg | ORAL_TABLET | Freq: Every evening | ORAL | Status: DC | PRN
  Administered 2012-11-15: 5 mg via ORAL
  Filled 2012-11-14: qty 1

## 2012-11-14 MED ORDER — LACTATED RINGERS IV SOLN: INTRAVENOUS | Status: DC

## 2012-11-14 MED ORDER — MEPERIDINE HCL 25 MG/ML IJ SOLN: 6.2500 mg | INTRAMUSCULAR | Status: DC | PRN

## 2012-11-14 MED ORDER — OXYCODONE HCL 5 MG PO TABS: 5.0000 mg | ORAL_TABLET | Freq: Once | ORAL | Status: AC | PRN

## 2012-11-14 MED ORDER — DEXAMETHASONE 4 MG PO TABS
4.0000 mg | ORAL_TABLET | Freq: Four times a day (QID) | ORAL | Status: DC
  Administered 2012-11-15: 4 mg via ORAL
  Filled 2012-11-14 (×7): qty 1

## 2012-11-14 MED ORDER — ARTIFICIAL TEARS OP OINT
TOPICAL_OINTMENT | OPHTHALMIC | Status: DC | PRN
  Administered 2012-11-14: 1 via OPHTHALMIC

## 2012-11-14 MED ORDER — MORPHINE SULFATE 2 MG/ML IJ SOLN
1.0000 mg | INTRAMUSCULAR | Status: DC | PRN
  Administered 2012-11-14: 2 mg via INTRAVENOUS
  Administered 2012-11-14: 4 mg via INTRAVENOUS
  Filled 2012-11-14: qty 1
  Filled 2012-11-14: qty 2

## 2012-11-14 MED ORDER — DEXAMETHASONE SODIUM PHOSPHATE 10 MG/ML IJ SOLN
INTRAMUSCULAR | Status: AC
  Filled 2012-11-14: qty 1

## 2012-11-14 MED ORDER — DEXAMETHASONE SODIUM PHOSPHATE 4 MG/ML IJ SOLN
4.0000 mg | Freq: Once | INTRAMUSCULAR | Status: AC
  Administered 2012-11-14: 8 mg via INTRAVENOUS
  Filled 2012-11-14: qty 1

## 2012-11-14 MED ORDER — SODIUM CHLORIDE 0.9 % IV SOLN: 250.0000 mL | INTRAVENOUS | Status: DC

## 2012-11-14 MED ORDER — BUPIVACAINE-EPINEPHRINE PF 0.25-1:200000 % IJ SOLN
INTRAMUSCULAR | Status: AC
  Filled 2012-11-14: qty 30

## 2012-11-14 MED ORDER — HYDROMORPHONE HCL PF 1 MG/ML IJ SOLN
0.2500 mg | INTRAMUSCULAR | Status: DC | PRN
  Administered 2012-11-14: 0.5 mg via INTRAVENOUS

## 2012-11-14 MED ORDER — POLYETHYLENE GLYCOL 3350 17 GM/SCOOP PO POWD: 17.0000 g | Freq: Every day | ORAL | Status: DC

## 2012-11-14 MED ORDER — BUPIVACAINE-EPINEPHRINE 0.25% -1:200000 IJ SOLN
INTRAMUSCULAR | Status: DC | PRN
  Administered 2012-11-14: 5 mL

## 2012-11-14 MED ORDER — OXYCODONE HCL 5 MG PO TABS
ORAL_TABLET | ORAL | Status: AC
  Administered 2012-11-14: 5 mg via ORAL
  Filled 2012-11-14: qty 1

## 2012-11-14 MED ORDER — CEFAZOLIN SODIUM-DEXTROSE 2-3 GM-% IV SOLR
INTRAVENOUS | Status: AC
  Administered 2012-11-14: 2 g via INTRAVENOUS
  Filled 2012-11-14: qty 50

## 2012-11-14 MED ORDER — OXYCODONE-ACETAMINOPHEN 10-325 MG PO TABS: 1.0000 | ORAL_TABLET | ORAL | Status: DC | PRN

## 2012-11-14 MED ORDER — DEXTROSE 5 % IV SOLN
INTRAVENOUS | Status: DC | PRN
  Administered 2012-11-14: 09:00:00 via INTRAVENOUS

## 2012-11-14 MED ORDER — METHOCARBAMOL 500 MG PO TABS
500.0000 mg | ORAL_TABLET | Freq: Four times a day (QID) | ORAL | Status: DC | PRN
  Administered 2012-11-14 (×2): 500 mg via ORAL
  Filled 2012-11-14 (×2): qty 1

## 2012-11-14 MED ORDER — DEXAMETHASONE SODIUM PHOSPHATE 4 MG/ML IJ SOLN
4.0000 mg | Freq: Four times a day (QID) | INTRAMUSCULAR | Status: DC
  Administered 2012-11-14 – 2012-11-15 (×2): 4 mg via INTRAVENOUS
  Filled 2012-11-14 (×8): qty 1

## 2012-11-14 MED ORDER — DOCUSATE SODIUM 100 MG PO CAPS: 100.0000 mg | ORAL_CAPSULE | Freq: Three times a day (TID) | ORAL | Status: DC | PRN

## 2012-11-14 MED ORDER — METHOCARBAMOL 100 MG/ML IJ SOLN: 500.0000 mg | Freq: Four times a day (QID) | INTRAVENOUS | Status: DC | PRN

## 2012-11-14 MED ORDER — HEMOSTATIC AGENTS (NO CHARGE) OPTIME
TOPICAL | Status: DC | PRN
  Administered 2012-11-14: 1 via TOPICAL

## 2012-11-14 MED ORDER — SODIUM CHLORIDE 0.9 % IJ SOLN: 3.0000 mL | INTRAMUSCULAR | Status: DC | PRN

## 2012-11-14 MED ORDER — PROPOFOL 10 MG/ML IV BOLUS
INTRAVENOUS | Status: DC | PRN
  Administered 2012-11-14: 190 mg via INTRAVENOUS
  Administered 2012-11-14: 60 mg via INTRAVENOUS

## 2012-11-14 MED ORDER — ONDANSETRON HCL 4 MG/2ML IJ SOLN: 4.0000 mg | INTRAMUSCULAR | Status: DC | PRN

## 2012-11-14 MED ORDER — ACETAMINOPHEN 10 MG/ML IV SOLN
1000.0000 mg | Freq: Four times a day (QID) | INTRAVENOUS | Status: DC
  Administered 2012-11-14 – 2012-11-15 (×3): 1000 mg via INTRAVENOUS
  Filled 2012-11-14 (×4): qty 100

## 2012-11-14 MED ORDER — CEFAZOLIN SODIUM 1-5 GM-% IV SOLN
1.0000 g | Freq: Three times a day (TID) | INTRAVENOUS | Status: AC
  Administered 2012-11-14 – 2012-11-15 (×2): 1 g via INTRAVENOUS
  Filled 2012-11-14 (×2): qty 50

## 2012-11-14 MED ORDER — THROMBIN 20000 UNITS EX SOLR
CUTANEOUS | Status: DC | PRN
  Administered 2012-11-14: 09:00:00 via TOPICAL

## 2012-11-14 MED ORDER — ACETAMINOPHEN 10 MG/ML IV SOLN
1000.0000 mg | Freq: Once | INTRAVENOUS | Status: AC
  Administered 2012-11-14: 1000 mg via INTRAVENOUS
  Filled 2012-11-14: qty 100

## 2012-11-14 MED ORDER — EPHEDRINE SULFATE 50 MG/ML IJ SOLN
INTRAMUSCULAR | Status: DC | PRN
  Administered 2012-11-14: 5 mg via INTRAVENOUS

## 2012-11-14 MED ORDER — PROMETHAZINE HCL 25 MG/ML IJ SOLN: 6.2500 mg | INTRAMUSCULAR | Status: DC | PRN

## 2012-11-14 SURGICAL SUPPLY — 58 items
BLADE SURG ROTATE 9660 (MISCELLANEOUS) IMPLANT
BUR EGG ELITE 4.0 (BURR) IMPLANT
BUR MATCHSTICK NEURO 3.0 LAGG (BURR) IMPLANT
CANISTER SUCTION 2500CC (MISCELLANEOUS) ×2 IMPLANT
CHLORAPREP W/TINT 10.5 ML (MISCELLANEOUS) ×1 IMPLANT
CLOTH BEACON ORANGE TIMEOUT ST (SAFETY) ×2 IMPLANT
CLSR STERI-STRIP ANTIMIC 1/2X4 (GAUZE/BANDAGES/DRESSINGS) ×2 IMPLANT
CORDS BIPOLAR (ELECTRODE) ×2 IMPLANT
COVER SURGICAL LIGHT HANDLE (MISCELLANEOUS) ×4 IMPLANT
CRADLE DONUT ADULT HEAD (MISCELLANEOUS) ×2 IMPLANT
DEVICE ENDSKLTN TC MED 8MM (Orthopedic Implant) ×1 IMPLANT
DRAPE C-ARM 42X72 X-RAY (DRAPES) ×3 IMPLANT
DRAPE POUCH INSTRU U-SHP 10X18 (DRAPES) ×2 IMPLANT
DRAPE SURG 17X23 STRL (DRAPES) ×2 IMPLANT
DRAPE U-SHAPE 47X51 STRL (DRAPES) ×2 IMPLANT
DRSG MEPILEX BORDER 4X4 (GAUZE/BANDAGES/DRESSINGS) ×2 IMPLANT
DURAPREP 26ML APPLICATOR (WOUND CARE) ×1 IMPLANT
ELECT COATED BLADE 2.86 ST (ELECTRODE) ×2 IMPLANT
ELECT REM PT RETURN 9FT ADLT (ELECTROSURGICAL) ×2
ELECTRODE REM PT RTRN 9FT ADLT (ELECTROSURGICAL) ×1 IMPLANT
ENDOSKELTON TC IMPLANT 8MM MED (Orthopedic Implant) ×2 IMPLANT
GLOVE BIO SURGEON STRL SZ7 (GLOVE) ×1 IMPLANT
GLOVE BIOGEL PI IND STRL 8.5 (GLOVE) ×1 IMPLANT
GLOVE BIOGEL PI INDICATOR 8.5 (GLOVE) ×1
GLOVE ECLIPSE 8.5 STRL (GLOVE) ×2 IMPLANT
GOWN PREVENTION PLUS XXLARGE (GOWN DISPOSABLE) ×2 IMPLANT
GOWN STRL REIN XL XLG (GOWN DISPOSABLE) ×4 IMPLANT
KIT BASIN OR (CUSTOM PROCEDURE TRAY) ×2 IMPLANT
KIT ROOM TURNOVER OR (KITS) ×2 IMPLANT
NDL SPNL 18GX3.5 QUINCKE PK (NEEDLE) ×1 IMPLANT
NEEDLE SPNL 18GX3.5 QUINCKE PK (NEEDLE) ×2 IMPLANT
NS IRRIG 1000ML POUR BTL (IV SOLUTION) ×2 IMPLANT
PACK ORTHO CERVICAL (CUSTOM PROCEDURE TRAY) ×2 IMPLANT
PACK UNIVERSAL I (CUSTOM PROCEDURE TRAY) ×2 IMPLANT
PAD ARMBOARD 7.5X6 YLW CONV (MISCELLANEOUS) ×4 IMPLANT
PATTIES SURGICAL .25X.25 (GAUZE/BANDAGES/DRESSINGS) IMPLANT
PIN DISTRACTION 14 (PIN) ×2 IMPLANT
PIN RETAINER PRODISC 14 MM (PIN) ×1 IMPLANT
PLATE SKYLINE 12MM (Plate) ×1 IMPLANT
PUTTY BONE DBX 2.5 MIS (Bone Implant) ×1 IMPLANT
RESTRAINT LIMB HOLDER UNIV (RESTRAINTS) ×2 IMPLANT
RETAINER SCREW 3.5X14MM ×2 IMPLANT
SCREW SELF DRILL SKYLINE 12MM (Screw) ×2 IMPLANT
SCREW SKYLINE 14MM SD-VA (Screw) ×2 IMPLANT
SPONGE INTESTINAL PEANUT (DISPOSABLE) ×2 IMPLANT
SPONGE SURGIFOAM ABS GEL 100 (HEMOSTASIS) ×2 IMPLANT
SURGIFLO TRUKIT (HEMOSTASIS) ×1 IMPLANT
SUT MNCRL AB 3-0 PS2 18 (SUTURE) ×2 IMPLANT
SUT SILK 2 0 (SUTURE)
SUT SILK 2-0 18XBRD TIE 12 (SUTURE) IMPLANT
SUT VIC AB 2-0 CT1 18 (SUTURE) ×2 IMPLANT
SYR BULB IRRIGATION 50ML (SYRINGE) ×2 IMPLANT
SYR CONTROL 10ML LL (SYRINGE) ×2 IMPLANT
TAPE CLOTH 4X10 WHT NS (GAUZE/BANDAGES/DRESSINGS) ×2 IMPLANT
TAPE UMBILICAL COTTON 1/8X30 (MISCELLANEOUS) ×2 IMPLANT
TOWEL OR 17X24 6PK STRL BLUE (TOWEL DISPOSABLE) ×2 IMPLANT
TOWEL OR 17X26 10 PK STRL BLUE (TOWEL DISPOSABLE) ×2 IMPLANT
WATER STERILE IRR 1000ML POUR (IV SOLUTION) ×1 IMPLANT

## 2012-11-14 NOTE — Anesthesia Procedure Notes (Signed)
Procedure Name: Intubation Date/Time: 11/14/2012 8:34 AM Performed by: Wray Kearns A Pre-anesthesia Checklist: Patient identified, Timeout performed, Emergency Drugs available and Suction available Patient Re-evaluated:Patient Re-evaluated prior to inductionOxygen Delivery Method: Circle system utilized Preoxygenation: Pre-oxygenation with 100% oxygen Intubation Type: IV induction and Cricoid Pressure applied Ventilation: Mask ventilation without difficulty Laryngoscope Size: Mac and 4 Grade View: Grade I Tube type: Oral Tube size: 8.0 mm Number of attempts: 1 Airway Equipment and Method: Stylet and LTA kit utilized Placement Confirmation: ETT inserted through vocal cords under direct vision,  breath sounds checked- equal and bilateral and positive ETCO2 Secured at: 23 cm Tube secured with: Tape Dental Injury: Teeth and Oropharynx as per pre-operative assessment

## 2012-11-14 NOTE — Transfer of Care (Signed)
Immediate Anesthesia Transfer of Care Note  Patient: Dennis Harris  Procedure(s) Performed: Procedure(s): ANTERIOR CERVICAL DECOMPRESSION/DISCECTOMY FUSION 1 LEVEL C5-6 (N/A)  Patient Location: PACU  Anesthesia Type:General  Level of Consciousness: awake, alert  and oriented  Airway & Oxygen Therapy: Patient Spontanous Breathing and Patient connected to nasal cannula oxygen  Post-op Assessment: Report given to PACU RN, Post -op Vital signs reviewed and stable and Patient moving all extremities X 4  Post vital signs: Reviewed and stable  Complications: No apparent anesthesia complications

## 2012-11-14 NOTE — Anesthesia Preprocedure Evaluation (Addendum)
Anesthesia Evaluation  Patient identified by MRN, date of birth, ID band Patient awake    Reviewed: Allergy & Precautions, H&P , NPO status , Patient's Chart, lab work & pertinent test results  Airway Mallampati: I  Neck ROM: Full    Dental  (+) Teeth Intact and Dental Advisory Given   Pulmonary shortness of breath,  breath sounds clear to auscultation        Cardiovascular hypertension, Rhythm:Regular Rate:Normal     Neuro/Psych Depression    GI/Hepatic negative GI ROS, Neg liver ROS,   Endo/Other  negative endocrine ROS  Renal/GU negative Renal ROS     Musculoskeletal  (+) Arthritis -, Rheumatoid disorders,    Abdominal   Peds  Hematology negative hematology ROS (+)   Anesthesia Other Findings   Reproductive/Obstetrics                          Anesthesia Physical Anesthesia Plan  ASA: II  Anesthesia Plan: General   Post-op Pain Management:    Induction: Intravenous  Airway Management Planned: Oral ETT  Additional Equipment:   Intra-op Plan:   Post-operative Plan: Extubation in OR  Informed Consent: I have reviewed the patients History and Physical, chart, labs and discussed the procedure including the risks, benefits and alternatives for the proposed anesthesia with the patient or authorized representative who has indicated his/her understanding and acceptance.     Plan Discussed with: CRNA and Surgeon  Anesthesia Plan Comments:         Anesthesia Quick Evaluation

## 2012-11-14 NOTE — Brief Op Note (Signed)
11/14/2012  10:47 AM  PATIENT:  Hilliard Clark Woodfin  47 y.o. 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  PROCEDURE:  Procedure(s): ANTERIOR CERVICAL DECOMPRESSION/DISCECTOMY FUSION 1 LEVEL C5-6 (N/A)  SURGEON:  Surgeon(s) and Role:    * Venita Lick, MD - Primary  PHYSICIAN ASSISTANT:   ASSISTANTS: none   ANESTHESIA:   general  EBL:  Total I/O In: 2250 [I.V.:2250] Out: -   BLOOD ADMINISTERED:none  DRAINS: none   LOCAL MEDICATIONS USED:  MARCAINE     SPECIMEN:  No Specimen  DISPOSITION OF SPECIMEN:  N/A  COUNTS:  YES  TOURNIQUET:  * No tourniquets in log *  DICTATION: .Other Dictation: Dictation Number [id number]  PLAN OF CARE: Admit for overnight observation  PATIENT DISPOSITION:  PACU - hemodynamically stable.

## 2012-11-14 NOTE — Anesthesia Postprocedure Evaluation (Signed)
  Anesthesia Post-op Note  Patient: Dennis Harris  Procedure(s) Performed: Procedure(s): ANTERIOR CERVICAL DECOMPRESSION/DISCECTOMY FUSION 1 LEVEL C5-6 (N/A)  Patient Location: PACU  Anesthesia Type:General  Level of Consciousness: awake and alert   Airway and Oxygen Therapy: Patient Spontanous Breathing  Post-op Pain: mild  Post-op Assessment: Post-op Vital signs reviewed  Post-op Vital Signs: stable  Complications: No apparent anesthesia complications

## 2012-11-14 NOTE — H&P (Signed)
No change in clinical exam H+P reviewed  

## 2012-11-14 NOTE — Preoperative (Signed)
Beta Blockers   Reason not to administer Beta Blockers:Not Applicable 

## 2012-11-14 NOTE — Op Note (Signed)
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  OPERATIVE PROCEDURE:  Anterior cervical diskectomy and fusion, C5-6. Instrumentation system used was the Titan titanium intervertebral cage, size 8 lordotic medium packed with DBX mix with a 12-mm anterior cervical DePuy Skyline plate affixed with 14-mm screws self-drilling into the body of C5 and 12-mm screws into the body of C6.  COMPLICATIONS:  None.  CONDITION:  Stable.  HISTORY:  This is a very pleasant gentleman who has been having severe neck and radicular right arm pain after an injury.  The patient has failed to improve with conservative measures, and as a result declining quality of life and pain, we elected to proceed with surgery.  All appropriate risks, benefits, and alternatives were discussed with the patient.  Consent was obtained.  OPERATIVE NOTE:  The patient was brought to the operating room, placed supine on the operating table.  After successful induction of general anesthesia and endotracheal intubation, TEDs and SCDs were applied. Towels were placed between the shoulder blades and the strings were placed on the wrist for intraoperative traction.  The anterior cervical spine was prepped and draped in a standard fashion.  Appropriate time- out was done to confirm patient, procedure, and all other pertinent important data.  Once this was completed, I then used x-ray to identify the appropriate incision site and then infiltrated this incision with 0.25% Marcaine.  A transverse incision was made starting at the  midline proceeding to the left.  Sharp dissection was carried out down to the platysma and the platysma was sharply incised.  I then dissected along the medial border of sternocleidomastoid, sweeping the deep cervical fascia.  I was then able to mobilize the esophagus and trachea with my finger and placed a retractor to hold a retractor to the right.  I then identified the carotid sheath and protected that with my finger.  I then used Kittner dissectors to remove the remaining deep cervical and prevertebral fascia until I could expose the anterior cervical spine. Once this was done, a needle was placed into the C5-6 disk space and this confirmed that I was at the appropriate level.  Once this was done, I mobilized the longus colli muscles from the midbody of C5 to that affixed using bipolar electrocautery.  Self-retaining retractor blades were placed underneath the longus colli muscle and I expanded them to the appropriate width.  During this process, I did deflate and reinflate the endotracheal cuff.  I then incised the annulus with a 15 blade scalpel.  Using pituitary rongeurs, curettes, and Kerrison rongeurs, I removed all of the disk material at C5-6 level.  Distraction pins were then placed into the C5-6 disk space and I gently distracted the space.  I then used a neuro curettes to resect the remaining posterior disk down to the  annulus.  I then used a nerve hook to sweep on the right-hand side, until I was able to mobilize the disk herniation.  This was removed with a micropituitary rongeur.  I then developed a plane underneath the posterior longitudinal ligament and then resected the posterior longitudinal ligament with an 1-mm Kerrison, I was then able to underneath the uncovertebral joint and removed some of the osteophyte from this area as well.  At this point, I could freely pass my nerve hook underneath the vertebral bodies of C5 and C6, and there was no undue tension or  neural compression.  At this point, I rasped the endplates until I had bleeding subchondral bone.  I then trialed with an 8 and 7 mm lordotic cage and the A provided the best fit.  I obtained the cage packed with DBX mix and malleted it to the appropriate depth. I then affixed a 12-mm anterior cervical plate with self-drilling screws into the bodies of C5 and C6.  All screws had excellent purchase.  I then torqued the locking device according to manufacturers standards on all 4 knots.  I then irrigated the wound copiously with normal saline, and then removed the self-retaining retractor.  I irrigated again, made sure I had hemostasis using bipolar electrocautery.  I then checked to make sure the trachea and esophagus were not entrapped beneath the plate and then returned them to the midline.  Final x-rays were taken which were satisfactory.  The platysma was reapproximated with 2-0 Vicryl sutures and the skin with 3-0 Monocryl.  Steri-Strips and dry dressing were then applied as was an Biochemist, clinical.  The patient was extubated, transferred to PACU without incident.  At the end of the case, all needle and sponge counts were correct.     Alvy Beal, MD     DDB/MEDQ  D:  11/14/2012  T:  11/14/2012  Job:  [id number]

## 2012-11-15 ENCOUNTER — Encounter (HOSPITAL_COMMUNITY): Payer: Self-pay | Admitting: Orthopedic Surgery

## 2012-11-15 NOTE — Discharge Summary (Signed)
Patient ID: Dennis Harris MRN: [id number] DOB/AGE: [birth year] 48 y.o.  Admit date: 11/14/2012 Discharge date: 11/15/2012  Admission Diagnoses:  Principal Problem:   Cervical spondylosis without myelopathy   Discharge Diagnoses:  Principal Problem:   Cervical spondylosis without myelopathy  status post Procedure(s): ANTERIOR CERVICAL DECOMPRESSION/DISCECTOMY FUSION 1 LEVEL C5-6  Past Medical History  Diagnosis Date  . Hypercholesteremia   . Pericarditis ~ 2009  . Exertional shortness of breath     "at times" (11/14/2012)  . Rheumatoid arthritis     Surgeries: Procedure(s): ANTERIOR CERVICAL DECOMPRESSION/DISCECTOMY FUSION 1 LEVEL C5-6 on 11/14/2012   Consultants:  none  Discharged Condition: Improved  Hospital Course: Dennis Harris is an 47 y.o. male who was admitted 11/14/2012 for operative treatment of Cervical spondylosis without myelopathy. Patient failed conservative treatments (please see the history and physical for the specifics) and had severe unremitting pain that affects sleep, daily activities and work/hobbies. After pre-op clearance, the patient was taken to the operating room on 11/14/2012 and underwent  Procedure(s): ANTERIOR CERVICAL DECOMPRESSION/DISCECTOMY FUSION 1 LEVEL C5-6.    Patient was given perioperative antibiotics: Anti-infectives   Start     Dose/Rate Route Frequency Ordered Stop   11/14/12 1630  ceFAZolin (ANCEF) IVPB 1 g/50 mL premix     1 g 100 mL/hr over 30 Minutes Intravenous Every 8 hours 11/14/12 1509 11/15/12 0117   11/14/12 0611  ceFAZolin (ANCEF) 2-3 GM-% IVPB SOLR    Comments:  MICHAEL, CYNTHIA: cabinet override      11/14/12 0611 11/14/12 0830   11/13/12 1421  ceFAZolin (ANCEF) IVPB 2 g/50 mL premix  Status:  Discontinued     2 g 100 mL/hr over 30 Minutes Intravenous 30 min pre-op 11/13/12 1421 11/14/12 1459       Patient was given sequential compression devices and early ambulation to prevent DVT.   Patient  benefited maximally from hospital stay and there were no complications. At the time of discharge, the patient was urinating/moving their bowels without difficulty, tolerating a regular diet, pain is controlled with oral pain medications and they have been cleared by PT/OT.   Recent vital signs: Patient Vitals for the past 24 hrs:  BP Temp Temp src Pulse Resp SpO2  11/15/12 0649 150/72 mmHg 98.8 F (37.1 C) - 105 18 98 %  11/14/12 2246 157/76 mmHg 100.6 F (38.1 C) - 107 18 99 %  11/14/12 1505 181/88 mmHg 97.3 F (36.3 C) Oral 76 - 99 %  11/14/12 1445 158/85 mmHg 97.9 F (36.6 C) - 80 14 100 %  11/14/12 1430 114/59 mmHg - - 69 12 99 %  11/14/12 1415 - - - 70 12 98 %  11/14/12 1400 - - - 74 12 100 %  11/14/12 1345 148/84 mmHg - - 86 16 99 %  11/14/12 1330 - - - 70 14 98 %  11/14/12 1315 - - - 81 21 98 %  11/14/12 1300 141/87 mmHg - - 77 11 97 %  11/14/12 1245 144/86 mmHg - - 69 11 97 %  11/14/12 1230 135/77 mmHg 97.9 F (36.6 C) - 80 15 100 %  11/14/12 1215 126/63 mmHg - - 70 12 100 %  11/14/12 1200 138/72 mmHg - - 63 9 100 %  11/14/12 1145 147/82 mmHg - - 61 13 100 %  11/14/12 1130 149/81 mmHg - - 77 16 100 %  11/14/12 1115 110/98 mmHg - - 70 13 100 %  11/14/12 1100 129/62 mmHg 96.8  F (36 C) - 72 19 99 %     Recent laboratory studies: No results found for this basename: WBC, HGB, HCT, PLT, NA, K, CL, CO2, BUN, CREATININE, GLUCOSE, PT, INR, CALCIUM, 2,  in the last 72 hours   Discharge Medications:     Medication List    STOP taking these medications       methotrexate 2.5 MG tablet  Commonly known as:  RHEUMATREX     predniSONE 5 MG tablet  Commonly known as:  DELTASONE      TAKE these medications       atorvastatin 10 MG tablet  Commonly known as:  LIPITOR  Take 10 mg by mouth daily.     docusate sodium 100 MG capsule  Commonly known as:  COLACE  Take 1 capsule (100 mg total) by mouth 3 (three) times daily as needed for constipation.     folic acid 1 MG  tablet  Commonly known as:  FOLVITE  Take 1 mg by mouth daily.     methocarbamol 500 MG tablet  Commonly known as:  ROBAXIN  Take 1 tablet (500 mg total) by mouth 3 (three) times daily as needed.     multivitamin tablet  Take 1 tablet by mouth daily.     ondansetron 4 MG tablet  Commonly known as:  ZOFRAN  Take 1 tablet (4 mg total) by mouth every 8 (eight) hours as needed for nausea.     oxyCODONE-acetaminophen 10-325 MG per tablet  Commonly known as:  PERCOCET  Take 1 tablet by mouth every 4 (four) hours as needed for pain.     polyethylene glycol powder powder  Commonly known as:  GLYCOLAX  Take 17 g by mouth daily.        Diagnostic Studies: Dg Chest 2 View  11/07/2012  *RADIOLOGY REPORT*  Clinical Data: Cough, pericarditis  CHEST - 2 VIEW  Comparison: January 03, 2012.  Findings: Cardiomediastinal silhouette appears normal.  No acute pulmonary disease is noted.  Bony thorax is intact.  No pneumothorax or pleural effusion is noted.  IMPRESSION: No acute cardiopulmonary abnormality seen.   Original Report Authenticated By: Lupita Raider.,  M.D.    Dg Cervical Spine 2 Or 3 Views  11/14/2012  *RADIOLOGY REPORT*  Clinical Data: Status post fusion  CERVICAL SPINE - 2-3 VIEW  Comparison: Intraoperative films earlier today  Findings:  AP and lateral cervical spine radiographs.  Color demonstrate C5-6 ACDF.  Satisfactory position and alignment. Prevertebral soft tissue swelling is noted.  IMPRESSION: As above.   Original Report Authenticated By: Davonna Belling, M.D.    Dg Cervical Spine 2-3 Views  11/14/2012  *RADIOLOGY REPORT*  Clinical Data: Neck pain  DG C-ARM 61-120 MIN,CERVICAL SPINE - 2-3 VIEW  Technique:  C-arm fluoroscopic images were obtained intraoperatively and submitted for postoperative interpretation. Please see the performing provider's procedural report for the fluoroscopy time utilized.  Comparison: Plain films 11/07/2012.  Findings: The AP lateral C-arm views demonstrate  C5-6 ACDF. Satisfactory position and alignment.  IMPRESSION: As above.   Original Report Authenticated By: Davonna Belling, M.D.    Dg Cervical Spine 2 Or 3 Views  11/07/2012  *RADIOLOGY REPORT*  Clinical Data: Patient for cervical spine surgery.  Preoperative films.  CERVICAL SPINE - 2-3 VIEW  Comparison: Plain films cervical spine 01/05/2012.  Findings: Vertebral body height and alignment are normal.  There is some loss of disc space height and endplate spurring about the C5-6 level.  Prevertebral soft tissues  appear normal.  IMPRESSION: No acute finding.  C5-6 degenerative change.   Original Report Authenticated By: Holley Dexter, M.D.    Dg C-arm [phone] Min  11/14/2012  *RADIOLOGY REPORT*  Clinical Data: Neck pain  DG C-ARM 61-120 MIN,CERVICAL SPINE - 2-3 VIEW  Technique:  C-arm fluoroscopic images were obtained intraoperatively and submitted for postoperative interpretation. Please see the performing provider's procedural report for the fluoroscopy time utilized.  Comparison: Plain films 11/07/2012.  Findings: The AP lateral C-arm views demonstrate C5-6 ACDF. Satisfactory position and alignment.  IMPRESSION: As above.   Original Report Authenticated By: Davonna Belling, M.D.           Follow-up Information   Follow up with Alvy Beal, MD. Call in 2 weeks.   Contact information:   [address] [place] [address] [phone]       Discharge Plan:  discharge to home  Disposition: stable    Signed: Venita Lick D for Dr. Venita Lick [facility] [phone] 11/15/2012, 8:46 AM

## 2012-11-15 NOTE — Progress Notes (Signed)
Pt ordered received for evaluation. I observed pt walking independently in the hall. Pt reported he has been up walking all morning and was wearing his cervical brace when OOB. Pt does not have any acute PT needs and evaluation not completed due to his level of Independence.

## 2012-11-15 NOTE — Progress Notes (Signed)
    Subjective: Procedure(s) (LRB): ANTERIOR CERVICAL DECOMPRESSION/DISCECTOMY FUSION 1 LEVEL C5-6 (N/A) 1 Day Post-Op  Patient reports pain as 2 on 0-10 scale.  Reports decreased arm pain reports incisional neck pain   Positive void Negative bowel movement Positive flatus Negative chest pain or shortness of breath  Objective: Vital signs in last 24 hours: Temp:  [96.8 F (36 C)-100.6 F (38.1 C)] 98.8 F (37.1 C) (04/24 0649) Pulse Rate:  [61-107] 105 (04/24 0649) Resp:  [9-21] 18 (04/24 0649) BP: (110-181)/(59-98) 150/72 mmHg (04/24 0649) SpO2:  [97 %-100 %] 98 % (04/24 0649)  Intake/Output from previous day: 04/23 0701 - 04/24 0700 In: 2830 [P.O.:480; I.V.:2350] Out: 275 [Urine:275]  Labs: No results found for this basename: WBC, RBC, HCT, PLT,  in the last 72 hours No results found for this basename: NA, K, CL, CO2, BUN, CREATININE, GLUCOSE, CALCIUM,  in the last 72 hours No results found for this basename: LABPT, INR,  in the last 72 hours  Physical Exam: Neurologically intact ABD soft Neurovascular intact Intact pulses distally Incision: dressing C/D/I and no drainage No cellulitis present Compartment soft  Assessment/Plan: Patient stable  xrays satisfactory Mobilization with physical therapy Encourage incentive spirometry Continue care  Advance diet Up with therapy D/C IV fluids Ok for d/c to home  Venita Lick, MD [facility] [phone]

## 2012-11-16 NOTE — Progress Notes (Signed)
Utilization review complete. Zavien Clubb RN CCM Case Mgmt phone [phone] 

## 2014-02-11 IMAGING — CR DG CERVICAL SPINE 2 OR 3 VIEWS
2 series · 2 of 2 positions shown · non-contrast
Comparison: Intraoperative films earlier today

CLINICAL DATA: Status post fusion

CERVICAL SPINE - 2-3 VIEW

[AP (1 of 2)]
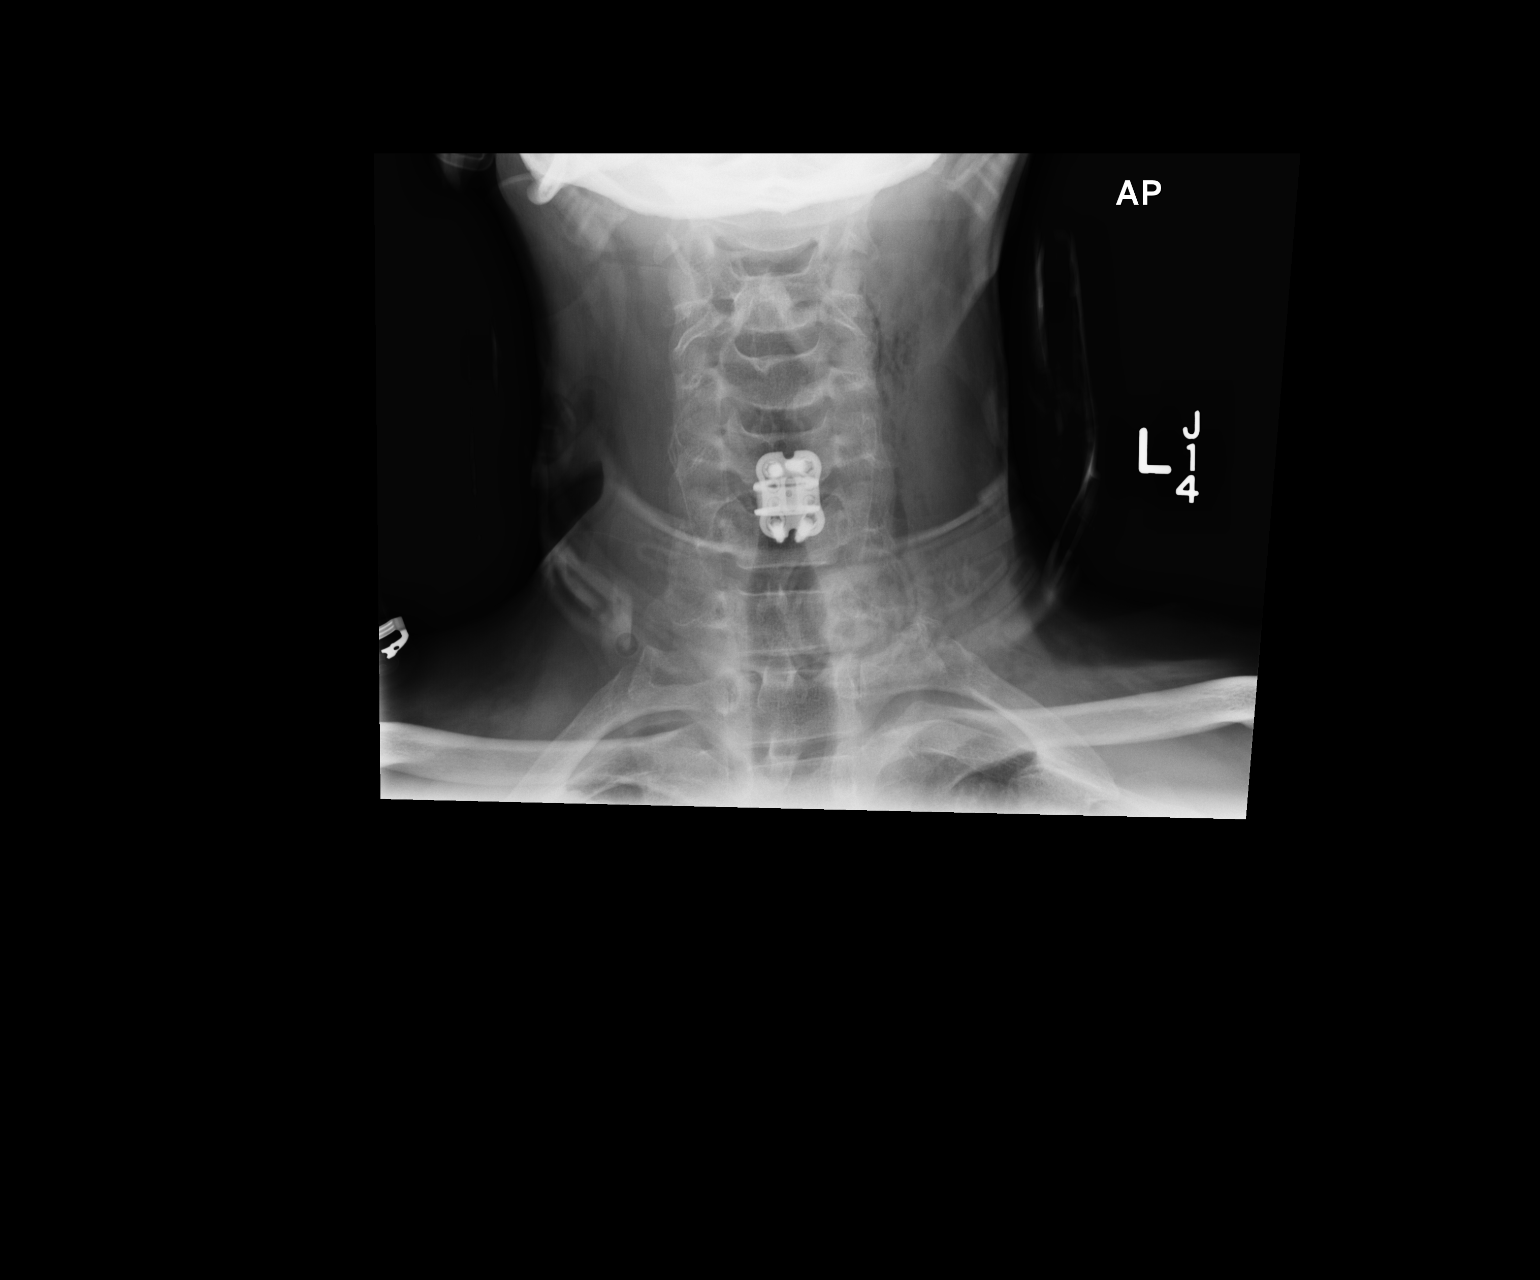

[AP (2 of 2)]
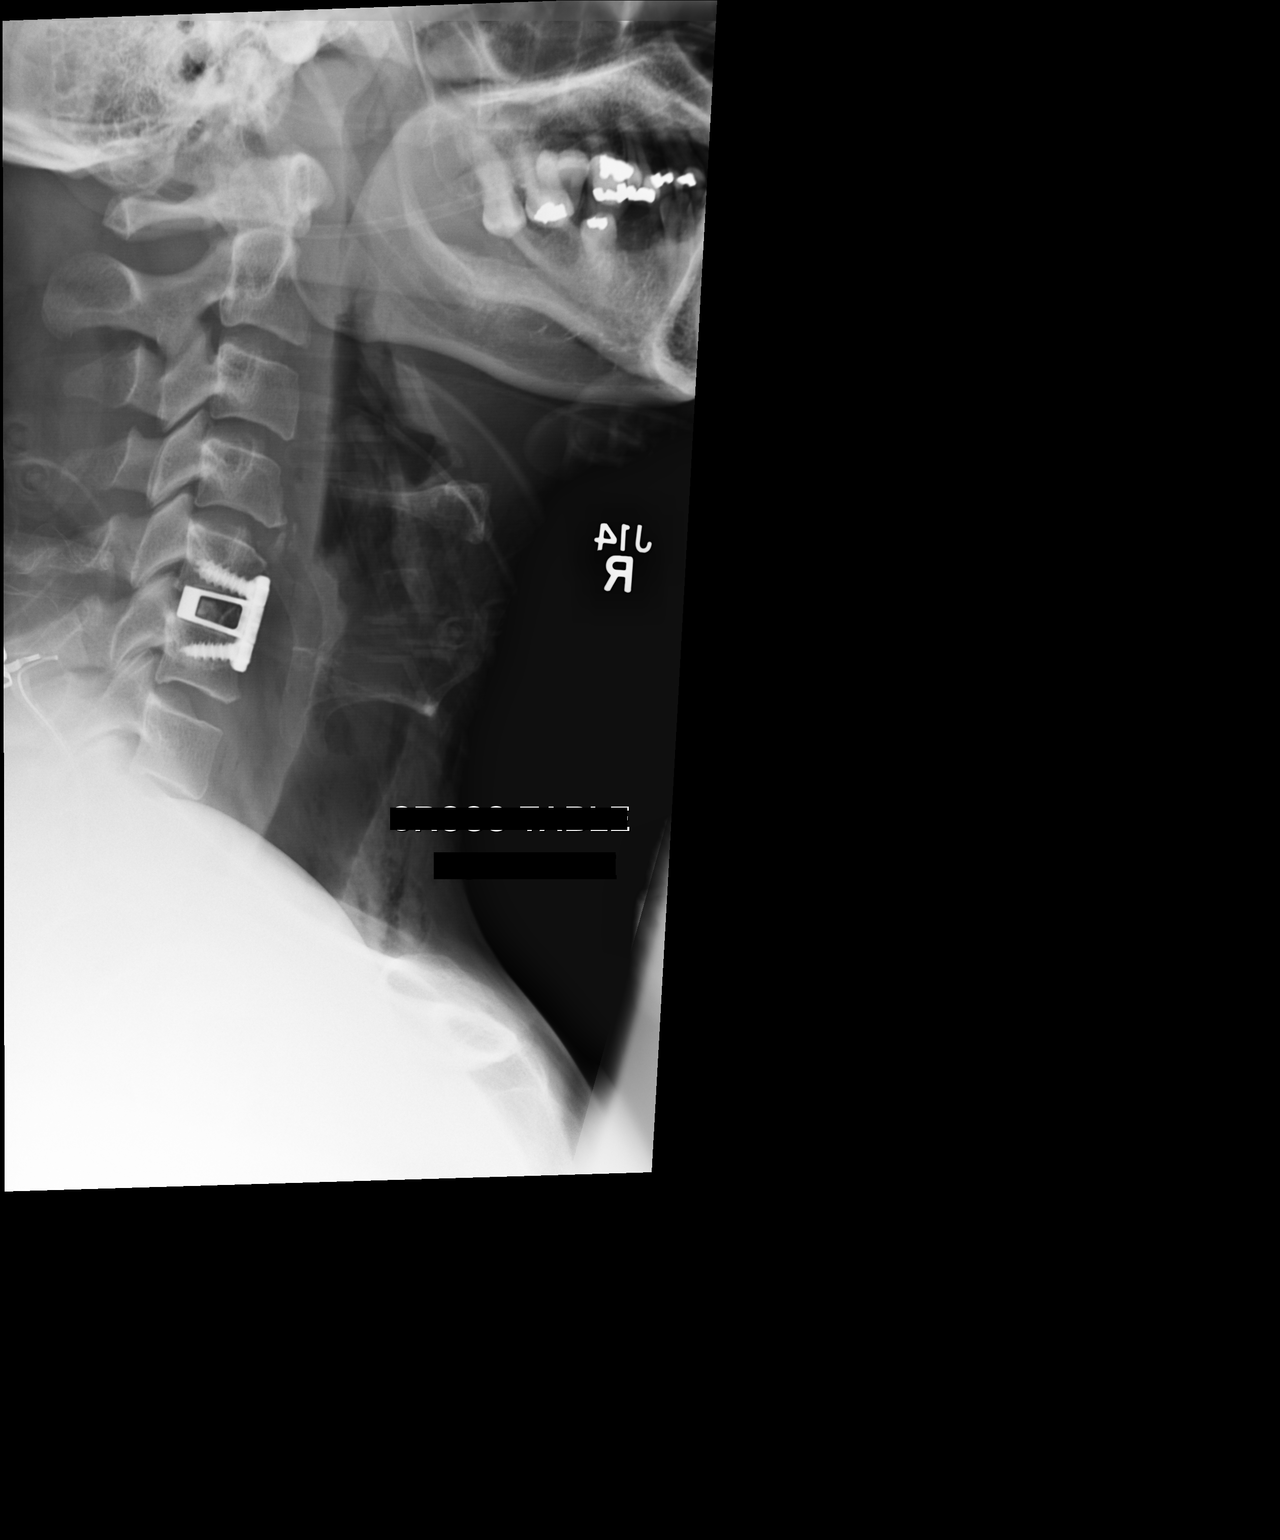

[2 of 2 positions shown; findings below may reference images not displayed]

FINDINGS: AP and lateral cervical spine radiographs.  Color
demonstrate C5-6 ACDF.  Satisfactory position and alignment.
Prevertebral soft tissue swelling is noted.
IMPRESSION: As above.

## 2014-06-25 ENCOUNTER — Emergency Department (HOSPITAL_COMMUNITY)
Admission: EM | Admit: 2014-06-25 | Discharge: 2014-06-25 | Disposition: A | Discharge status: Home / Self Care | Payer: Managed Care, Other (non HMO) | Attending: Emergency Medicine | Admitting: Emergency Medicine

## 2014-06-25 ENCOUNTER — Encounter (HOSPITAL_COMMUNITY): Payer: Self-pay | Admitting: Family Medicine

## 2014-06-25 DIAGNOSIS — Z8679 Personal history of other diseases of the circulatory system: Secondary | ICD-10-CM | POA: Insufficient documentation

## 2014-06-25 DIAGNOSIS — M25511 Pain in right shoulder: Secondary | ICD-10-CM | POA: Diagnosis not present

## 2014-06-25 DIAGNOSIS — Z720 Tobacco use: Secondary | ICD-10-CM | POA: Diagnosis not present

## 2014-06-25 DIAGNOSIS — Z9889 Other specified postprocedural states: Secondary | ICD-10-CM | POA: Insufficient documentation

## 2014-06-25 DIAGNOSIS — G8929 Other chronic pain: Secondary | ICD-10-CM | POA: Insufficient documentation

## 2014-06-25 DIAGNOSIS — M069 Rheumatoid arthritis, unspecified: Secondary | ICD-10-CM | POA: Insufficient documentation

## 2014-06-25 DIAGNOSIS — E780 Pure hypercholesterolemia: Secondary | ICD-10-CM | POA: Diagnosis not present

## 2014-06-25 DIAGNOSIS — Z79899 Other long term (current) drug therapy: Secondary | ICD-10-CM | POA: Diagnosis not present

## 2014-06-25 DIAGNOSIS — M25512 Pain in left shoulder: Secondary | ICD-10-CM | POA: Insufficient documentation

## 2014-06-25 MED ORDER — PREDNISONE 20 MG PO TABS: ORAL_TABLET | ORAL | Status: DC

## 2014-06-25 MED ORDER — PREDNISONE 20 MG PO TABS
60.0000 mg | ORAL_TABLET | Freq: Once | ORAL | Status: AC
  Administered 2014-06-25: 60 mg via ORAL
  Filled 2014-06-25: qty 3

## 2014-06-25 MED ORDER — OXYCODONE-ACETAMINOPHEN 10-325 MG PO TABS: 1.0000 | ORAL_TABLET | Freq: Four times a day (QID) | ORAL | Status: DC | PRN

## 2014-06-25 NOTE — ED Provider Notes (Signed)
CSN: [id number]     Arrival date & time 06/25/14  1838 History   First MD Initiated Contact with Patient 06/25/14 1922   This chart was scribed for non-physician practitioner, Fayrene Helper, PA-C working with Rolland Porter, MD, by Abel Presto, ED Scribe. This patient was seen in room TR05C/TR05C and the patient's care was started at 7:26 PM.     Chief Complaint  Patient presents with  . Shoulder Pain    HPI  HPI Comments: Dennis Harris is a 48 y.o. male with PMHx of rheumatoid arthirtis and bursitis who presents to the Emergency Department complaining of constant chronic shoulder pain that has worsened in the last 5 days. Pt notes pain started in his right shoulder and moved to his left.  Pt notes changes in amount of sleep due to pain. Pt states he called his Rheumatologist for an appointment a month ago for a refill of Prednisone and Methotrexate. He was told blood work would need to be done for assessment and was not able to schedule an appointment at that time. Pt has been using OTC creams and NSAID for relief.   Pt has history of surgery on neck for a ruptured disc last year and was out of work for 5 months following. Pt previously worked in a warehouse, frequently lifting which caused flare ups in his neck pain. Pt has since changed jobs and works as Merchandiser, retail in a warehouse and is lifting less now. Pt denies fever, rash, numbness, and any changes in his neck pain.   Past Medical History  Diagnosis Date  . Hypercholesteremia   . Pericarditis ~ 2009  . Exertional shortness of breath     "at times" (11/14/2012)  . Rheumatoid arthritis(714.0)    Past Surgical History  Procedure Laterality Date  . Cardiac catheterization  May 2010    normal  . Tonsillectomy  1970's?  . Appendectomy  1970's  . Anterior cervical decomp/discectomy fusion  11/14/2012  . Anterior cervical decomp/discectomy fusion N/A 11/14/2012    Procedure: ANTERIOR CERVICAL DECOMPRESSION/DISCECTOMY FUSION 1 LEVEL C5-6;   Surgeon: Venita Lick, MD;  Location: [facility] OR;  Service: Orthopedics;  Laterality: N/A;   Family History  Problem Relation Age of Onset  . Cirrhosis Father   . Hypertension Mother   . Stroke Mother   . Diabetes Mother   . Lupus Sister   . Lupus Sister    History  Substance Use Topics  . Smoking status: Current Every Day Smoker -- 0.25 packs/day for 25 years    Types: Cigarettes  . Smokeless tobacco: Never Used     Comment: 11/14/2012 offered smoking cessation materials; pt declines  . Alcohol Use: 8.4 oz/week    14 Cans of beer per week     Comment: 11/14/2012 "2 beers/day"    Review of Systems  Constitutional: Negative for fever.  Musculoskeletal: Positive for arthralgias.  Skin: Negative for rash.  Neurological: Negative for numbness.      Allergies  Review of patient's allergies indicates no known allergies.  Home Medications   Prior to Admission medications   Medication Sig Start Date End Date Taking? Authorizing Provider  atorvastatin (LIPITOR) 10 MG tablet Take 10 mg by mouth daily.    Historical Provider, MD  docusate sodium (COLACE) 100 MG capsule Take 1 capsule (100 mg total) by mouth 3 (three) times daily as needed for constipation. 11/14/12   Venita Lick, MD  folic acid (FOLVITE) 1 MG tablet Take 1 mg by mouth daily.  Historical Provider, MD  methocarbamol (ROBAXIN) 500 MG tablet Take 1 tablet (500 mg total) by mouth 3 (three) times daily as needed. 11/14/12   Venita Lick, MD  Multiple Vitamin (MULTIVITAMIN) tablet Take 1 tablet by mouth daily.      Historical Provider, MD  ondansetron (ZOFRAN) 4 MG tablet Take 1 tablet (4 mg total) by mouth every 8 (eight) hours as needed for nausea. 11/14/12   Venita Lick, MD  oxyCODONE-acetaminophen (PERCOCET) 10-325 MG per tablet Take 1 tablet by mouth every 4 (four) hours as needed for pain. 11/14/12   Venita Lick, MD  polyethylene glycol powder (GLYCOLAX) powder Take 17 g by mouth daily. 11/14/12   Venita Lick, MD    BP 161/86 mmHg  Pulse 102  Temp(Src) 98.3 F (36.8 C)  Resp 18  SpO2 99% Physical Exam  Constitutional: He is oriented to person, place, and time. He appears well-developed and well-nourished.  HENT:  Head: Normocephalic.  Eyes: Conjunctivae are normal.  Neck: Normal range of motion. Neck supple.  Cardiovascular:  Radial pulses 2+ to both hands bilaterally  Pulmonary/Chest: Effort normal.  Musculoskeletal:       Right shoulder: He exhibits decreased range of motion (abduction and adduction with rotation). He exhibits no deformity.       Left shoulder: He exhibits decreased range of motion (abduction and adduction with rotation). He exhibits no deformity.  Bilateral shoulders-no skin changes   Neurological: He is alert and oriented to person, place, and time. He has normal strength.  Sensations intact throughout both arms Normal strength in both arms  Skin: Skin is warm and dry.  Psychiatric: He has a normal mood and affect. His behavior is normal.  Nursing note and vitals reviewed.   ED Course  Procedures (including critical care time) DIAGNOSTIC STUDIES: Oxygen Saturation is 99% on room air, normal by my interpretation.    COORDINATION OF CARE: 7:33 PM Discussed treatment plan with patient at beside, the patient agrees with the plan and has no further questions at this time.  Patient here with acute on chronic bilateral shoulder pain. No evidence to suggest septic joints. No injury to suggest acute fracture or dislocation. No obvious signs of bursitis. Plan to provide a short course of steroid and pain medication however given his history of rheumatoid arthritis who would need to follow-up with his rheumatologist for further management of his condition.   Labs Review Labs Reviewed - No data to display  Imaging Review No results found.   EKG Interpretation None      MDM   Final diagnoses:  Shoulder pain, bilateral  RA (rheumatoid arthritis)   BP 161/86 mmHg   Pulse 102  Temp(Src) 98.3 F (36.8 C)  Resp 18  SpO2 99%  I personally performed the services described in this documentation, which was scribed in my presence. The recorded information has been reviewed and is accurate.      Fayrene Helper, PA-C 06/25/14 2025  Rolland Porter, MD 06/30/14 [phone]

## 2014-06-25 NOTE — ED Notes (Addendum)
Pt here with bilateral shoulder pain . sts his RA is flared up and usually where the pain is. sts 1 week. sts taking OTC meds without relief. sts he hasnt been able to get a refill on RA meds

## 2014-06-25 NOTE — Discharge Instructions (Signed)
Arthralgia °Your caregiver has diagnosed you as suffering from an arthralgia. Arthralgia means there is pain in a joint. This can come from many reasons including: °· Bruising the joint which causes soreness (inflammation) in the joint. °· Wear and tear on the joints which occur as we grow older (osteoarthritis). °· Overusing the joint. °· Various forms of arthritis. °· Infections of the joint. °Regardless of the cause of pain in your joint, most of these different pains respond to anti-inflammatory drugs and rest. The exception to this is when a joint is infected, and these cases are treated with antibiotics, if it is a bacterial infection. °HOME CARE INSTRUCTIONS  °· Rest the injured area for as long as directed by your caregiver. Then slowly start using the joint as directed by your caregiver and as the pain allows. Crutches as directed may be useful if the ankles, knees or hips are involved. If the knee was splinted or casted, continue use and care as directed. If an stretchy or elastic wrapping bandage has been applied today, it should be removed and re-applied every 3 to 4 hours. It should not be applied tightly, but firmly enough to keep swelling down. Watch toes and feet for swelling, bluish discoloration, coldness, numbness or excessive pain. If any of these problems (symptoms) occur, remove the ace bandage and re-apply more loosely. If these symptoms persist, contact your caregiver or return to this location. °· For the first 24 hours, keep the injured extremity elevated on pillows while lying down. °· Apply ice for 15-20 minutes to the sore joint every couple hours while awake for the first half day. Then 03-04 times per day for the first 48 hours. Put the ice in a plastic bag and place a towel between the bag of ice and your skin. °· Wear any splinting, casting, elastic bandage applications, or slings as instructed. °· Only take over-the-counter or prescription medicines for pain, discomfort, or fever as  directed by your caregiver. Do not use aspirin immediately after the injury unless instructed by your physician. Aspirin can cause increased bleeding and bruising of the tissues. °· If you were given crutches, continue to use them as instructed and do not resume weight bearing on the sore joint until instructed. °Persistent pain and inability to use the sore joint as directed for more than 2 to 3 days are warning signs indicating that you should see a caregiver for a follow-up visit as soon as possible. Initially, a hairline fracture (break in bone) may not be evident on X-rays. Persistent pain and swelling indicate that further evaluation, non-weight bearing or use of the joint (use of crutches or slings as instructed), or further X-rays are indicated. X-rays may sometimes not show a small fracture until a week or 10 days later. Make a follow-up appointment with your own caregiver or one to whom we have referred you. A radiologist (specialist in reading X-rays) may read your X-rays. Make sure you know how you are to obtain your X-ray results. Do not assume everything is normal if you do not hear from us. °SEEK MEDICAL CARE IF: °Bruising, swelling, or pain increases. °SEEK IMMEDIATE MEDICAL CARE IF:  °· Your fingers or toes are numb or blue. °· The pain is not responding to medications and continues to stay the same or get worse. °· The pain in your joint becomes severe. °· You develop a fever over 102° F (38.9° C). °· It becomes impossible to move or use the joint. °MAKE SURE YOU:  °·   Understand these instructions.  Will watch your condition.  Will get help right away if you are not doing well or get worse. Document Released: 07/11/2005 Document Revised: 10/03/2011 Document Reviewed: 02/27/2008 [facility] Patient Information 2015 [place], [place]. This information is not intended to replace advice given to you by your health care provider. Make sure you discuss any questions you have with your health care  provider.  Rheumatoid Arthritis Rheumatoid arthritis is a disease that causes pain, puffiness (swelling), and stiffness of the joints. It is a long-term (chronic) disease. It can affect the whole body, even the eyes and lungs.  HOME CARE  Stay active, but lessen activity when the disease gets worse.  Eat healthy foods.  Put heat on the affected joints when you wake up and before activity. Keep the heat on for as long as told by your doctor.  Put ice on the affected joints after activity or exercise.  Put ice in a plastic bag.  Place a towel between your skin and the bag.  Leave the ice on for 15-20 minutes, 03-04 times a day.  Take all medicine and other dietary pills (supplements) as told by your doctor.  Use a splint as told by your doctor. Splints help keep joints in a certain position to keep the joint working right.  Do not sleep with pillows under your knees.  Go to programs that can keep you updated on treatments and ways to deal with your disease. GET HELP RIGHT AWAY IF:  You have times where you pass out (faint).  You have times where you are really weak.  You suddenly have a hot, painful joint that feels worse than your normal joint ache.  You have chills.  You have a fever. Document Released: 10/03/2011 Document Revised: 11/25/2013 Document Reviewed: 10/03/2011 [facility] Patient Information 2015 [place], [place]. This information is not intended to replace advice given to you by your health care provider. Make sure you discuss any questions you have with your health care provider.

## 2014-08-03 ENCOUNTER — Inpatient Hospital Stay (HOSPITAL_COMMUNITY)
Admission: EM | Admit: 2014-08-03 | Discharge: 2014-08-08 | DRG: 871 | Disposition: A | Discharge status: Home / Self Care | Payer: Self-pay | Attending: Internal Medicine | Admitting: Internal Medicine

## 2014-08-03 ENCOUNTER — Emergency Department (HOSPITAL_COMMUNITY): Payer: Self-pay

## 2014-08-03 ENCOUNTER — Other Ambulatory Visit (HOSPITAL_COMMUNITY): Payer: Managed Care, Other (non HMO)

## 2014-08-03 ENCOUNTER — Encounter (HOSPITAL_COMMUNITY): Payer: Self-pay | Admitting: *Deleted

## 2014-08-03 DIAGNOSIS — A481 Legionnaires' disease: Secondary | ICD-10-CM | POA: Diagnosis present

## 2014-08-03 DIAGNOSIS — E860 Dehydration: Secondary | ICD-10-CM | POA: Diagnosis present

## 2014-08-03 DIAGNOSIS — R112 Nausea with vomiting, unspecified: Secondary | ICD-10-CM | POA: Diagnosis present

## 2014-08-03 DIAGNOSIS — E43 Unspecified severe protein-calorie malnutrition: Secondary | ICD-10-CM | POA: Diagnosis present

## 2014-08-03 DIAGNOSIS — Z91030 Bee allergy status: Secondary | ICD-10-CM

## 2014-08-03 DIAGNOSIS — F17210 Nicotine dependence, cigarettes, uncomplicated: Secondary | ICD-10-CM | POA: Diagnosis present

## 2014-08-03 DIAGNOSIS — I5042 Chronic combined systolic (congestive) and diastolic (congestive) heart failure: Secondary | ICD-10-CM | POA: Diagnosis present

## 2014-08-03 DIAGNOSIS — I319 Disease of pericardium, unspecified: Secondary | ICD-10-CM | POA: Diagnosis present

## 2014-08-03 DIAGNOSIS — R197 Diarrhea, unspecified: Secondary | ICD-10-CM | POA: Diagnosis present

## 2014-08-03 DIAGNOSIS — R079 Chest pain, unspecified: Secondary | ICD-10-CM | POA: Diagnosis present

## 2014-08-03 DIAGNOSIS — M069 Rheumatoid arthritis, unspecified: Secondary | ICD-10-CM | POA: Diagnosis present

## 2014-08-03 DIAGNOSIS — Z91013 Allergy to seafood: Secondary | ICD-10-CM

## 2014-08-03 DIAGNOSIS — A419 Sepsis, unspecified organism: Principal | ICD-10-CM | POA: Diagnosis present

## 2014-08-03 DIAGNOSIS — Z681 Body mass index (BMI) 19 or less, adult: Secondary | ICD-10-CM

## 2014-08-03 DIAGNOSIS — J189 Pneumonia, unspecified organism: Secondary | ICD-10-CM | POA: Insufficient documentation

## 2014-08-03 DIAGNOSIS — I429 Cardiomyopathy, unspecified: Secondary | ICD-10-CM

## 2014-08-03 DIAGNOSIS — R7989 Other specified abnormal findings of blood chemistry: Secondary | ICD-10-CM | POA: Diagnosis present

## 2014-08-03 DIAGNOSIS — E861 Hypovolemia: Secondary | ICD-10-CM | POA: Diagnosis present

## 2014-08-03 DIAGNOSIS — R6510 Systemic inflammatory response syndrome (SIRS) of non-infectious origin without acute organ dysfunction: Secondary | ICD-10-CM | POA: Diagnosis present

## 2014-08-03 DIAGNOSIS — R778 Other specified abnormalities of plasma proteins: Secondary | ICD-10-CM | POA: Diagnosis present

## 2014-08-03 DIAGNOSIS — E785 Hyperlipidemia, unspecified: Secondary | ICD-10-CM | POA: Diagnosis present

## 2014-08-03 DIAGNOSIS — E780 Pure hypercholesterolemia: Secondary | ICD-10-CM | POA: Diagnosis present

## 2014-08-03 DIAGNOSIS — E871 Hypo-osmolality and hyponatremia: Secondary | ICD-10-CM | POA: Diagnosis present

## 2014-08-03 DIAGNOSIS — R6520 Severe sepsis without septic shock: Secondary | ICD-10-CM | POA: Diagnosis present

## 2014-08-03 DIAGNOSIS — I309 Acute pericarditis, unspecified: Secondary | ICD-10-CM | POA: Diagnosis present

## 2014-08-03 DIAGNOSIS — R1011 Right upper quadrant pain: Secondary | ICD-10-CM | POA: Diagnosis not present

## 2014-08-03 LAB — I-STAT CG4 LACTIC ACID, ED: LACTIC ACID, VENOUS: 0.78 mmol/L (ref 0.5–2.2)

## 2014-08-03 LAB — CBC WITH DIFFERENTIAL/PLATELET
Basophils Absolute: 0 10*3/uL (ref 0.0–0.1)
Basophils Relative: 0 % (ref 0–1)
Eosinophils Absolute: 0 10*3/uL (ref 0.0–0.7)
Eosinophils Relative: 0 % (ref 0–5)
HCT: 36.2 % — ABNORMAL LOW (ref 39.0–52.0)
Hemoglobin: 11.6 g/dL — ABNORMAL LOW (ref 13.0–17.0)
LYMPHS ABS: 1 10*3/uL (ref 0.7–4.0)
Lymphocytes Relative: 7 % — ABNORMAL LOW (ref 12–46)
MCH: 24.9 pg — ABNORMAL LOW (ref 26.0–34.0)
MCHC: 32 g/dL (ref 30.0–36.0)
MCV: 77.8 fL — ABNORMAL LOW (ref 78.0–100.0)
Monocytes Absolute: 0.9 10*3/uL (ref 0.1–1.0)
Monocytes Relative: 6 % (ref 3–12)
NEUTROS PCT: 87 % — AB (ref 43–77)
Neutro Abs: 12.9 10*3/uL — ABNORMAL HIGH (ref 1.7–7.7)
PLATELETS: 258 10*3/uL (ref 150–400)
RBC: 4.65 MIL/uL (ref 4.22–5.81)
RDW: 13.6 % (ref 11.5–15.5)
WBC: 14.8 10*3/uL — AB (ref 4.0–10.5)

## 2014-08-03 LAB — COMPREHENSIVE METABOLIC PANEL
ALT: 21 U/L (ref 0–53)
AST: 26 U/L (ref 0–37)
Albumin: 3.1 g/dL — ABNORMAL LOW (ref 3.5–5.2)
Alkaline Phosphatase: 82 U/L (ref 39–117)
Anion gap: 10 (ref 5–15)
BUN: 9 mg/dL (ref 6–23)
CO2: 21 mmol/L (ref 19–32)
CREATININE: 1.14 mg/dL (ref 0.50–1.35)
Calcium: 8.7 mg/dL (ref 8.4–10.5)
Chloride: 101 mEq/L (ref 96–112)
GFR calc Af Amer: 86 mL/min — ABNORMAL LOW (ref >90)
GFR calc non Af Amer: 74 mL/min — ABNORMAL LOW (ref >90)
GLUCOSE: 117 mg/dL — AB (ref 70–99)
Potassium: 3.6 mmol/L (ref 3.5–5.1)
Sodium: 132 mmol/L — ABNORMAL LOW (ref 135–145)
Total Bilirubin: 1.2 mg/dL (ref 0.3–1.2)
Total Protein: 8.3 g/dL (ref 6.0–8.3)

## 2014-08-03 LAB — URINALYSIS, ROUTINE W REFLEX MICROSCOPIC
Bilirubin Urine: NEGATIVE
Glucose, UA: NEGATIVE mg/dL
Ketones, ur: 15 mg/dL — AB
LEUKOCYTES UA: NEGATIVE
Nitrite: NEGATIVE
Protein, ur: 100 mg/dL — AB
Specific Gravity, Urine: 1.014 (ref 1.005–1.030)
UROBILINOGEN UA: 4 mg/dL — AB (ref 0.0–1.0)
pH: 6.5 (ref 5.0–8.0)

## 2014-08-03 LAB — URINE MICROSCOPIC-ADD ON

## 2014-08-03 LAB — INFLUENZA PANEL BY PCR (TYPE A & B)
H1N1 flu by pcr: NOT DETECTED
INFLBPCR: NEGATIVE
Influenza A By PCR: NEGATIVE

## 2014-08-03 LAB — TROPONIN I
TROPONIN I: 0.04 ng/mL — AB (ref <0.031)
TROPONIN I: 0.04 ng/mL — AB (ref <0.031)
Troponin I: 0.04 ng/mL — ABNORMAL HIGH (ref <0.031)

## 2014-08-03 LAB — MRSA PCR SCREENING: MRSA by PCR: NEGATIVE

## 2014-08-03 LAB — STREP PNEUMONIAE URINARY ANTIGEN: Strep Pneumo Urinary Antigen: NEGATIVE

## 2014-08-03 MED ORDER — ENOXAPARIN SODIUM 40 MG/0.4ML ~~LOC~~ SOLN
40.0000 mg | SUBCUTANEOUS | Status: DC
  Administered 2014-08-04: 40 mg via SUBCUTANEOUS
  Filled 2014-08-03 (×2): qty 0.4

## 2014-08-03 MED ORDER — SODIUM CHLORIDE 0.9 % IV SOLN
1250.0000 mg | Freq: Once | INTRAVENOUS | Status: AC
  Administered 2014-08-03: 1250 mg via INTRAVENOUS
  Filled 2014-08-03: qty 1250

## 2014-08-03 MED ORDER — METHOCARBAMOL 500 MG PO TABS
500.0000 mg | ORAL_TABLET | Freq: Three times a day (TID) | ORAL | Status: DC | PRN
  Filled 2014-08-03 (×2): qty 1

## 2014-08-03 MED ORDER — OXYCODONE HCL 5 MG PO TABS
5.0000 mg | ORAL_TABLET | Freq: Four times a day (QID) | ORAL | Status: DC | PRN
  Administered 2014-08-04: 5 mg via ORAL
  Filled 2014-08-03: qty 1

## 2014-08-03 MED ORDER — IOHEXOL 300 MG/ML  SOLN
100.0000 mL | Freq: Once | INTRAMUSCULAR | Status: AC | PRN
  Administered 2014-08-03: 100 mL via INTRAVENOUS

## 2014-08-03 MED ORDER — ATORVASTATIN CALCIUM 10 MG PO TABS
10.0000 mg | ORAL_TABLET | Freq: Every day | ORAL | Status: DC
  Administered 2014-08-03 – 2014-08-05 (×3): 10 mg via ORAL
  Filled 2014-08-03 (×3): qty 1

## 2014-08-03 MED ORDER — SODIUM CHLORIDE 0.9 % IV BOLUS (SEPSIS)
1000.0000 mL | Freq: Once | INTRAVENOUS | Status: AC
  Administered 2014-08-03: 1000 mL via INTRAVENOUS

## 2014-08-03 MED ORDER — SODIUM CHLORIDE 0.9 % IV SOLN
INTRAVENOUS | Status: DC
  Administered 2014-08-03: 17:00:00 via INTRAVENOUS
  Administered 2014-08-04: 125 mL/h via INTRAVENOUS

## 2014-08-03 MED ORDER — ONDANSETRON HCL 4 MG/2ML IJ SOLN
4.0000 mg | Freq: Four times a day (QID) | INTRAMUSCULAR | Status: DC | PRN
  Administered 2014-08-03: 4 mg via INTRAVENOUS
  Filled 2014-08-03: qty 2

## 2014-08-03 MED ORDER — ACETAMINOPHEN 325 MG PO TABS
650.0000 mg | ORAL_TABLET | Freq: Four times a day (QID) | ORAL | Status: DC | PRN
  Administered 2014-08-03 – 2014-08-04 (×3): 650 mg via ORAL
  Filled 2014-08-03 (×3): qty 2

## 2014-08-03 MED ORDER — AZITHROMYCIN 500 MG IV SOLR
500.0000 mg | Freq: Once | INTRAVENOUS | Status: AC
  Administered 2014-08-03: 500 mg via INTRAVENOUS
  Filled 2014-08-03: qty 500

## 2014-08-03 MED ORDER — PREDNISONE 20 MG PO TABS
20.0000 mg | ORAL_TABLET | Freq: Every day | ORAL | Status: DC
  Administered 2014-08-04 – 2014-08-05 (×2): 20 mg via ORAL
  Filled 2014-08-03 (×3): qty 1

## 2014-08-03 MED ORDER — DEXTROSE 5 % IV SOLN
500.0000 mg | INTRAVENOUS | Status: DC
  Administered 2014-08-04 – 2014-08-05 (×2): 500 mg via INTRAVENOUS
  Filled 2014-08-03 (×2): qty 500

## 2014-08-03 MED ORDER — OSELTAMIVIR PHOSPHATE 75 MG PO CAPS: 75.0000 mg | ORAL_CAPSULE | Freq: Two times a day (BID) | ORAL | Status: DC

## 2014-08-03 MED ORDER — OSELTAMIVIR PHOSPHATE 75 MG PO CAPS
75.0000 mg | ORAL_CAPSULE | Freq: Two times a day (BID) | ORAL | Status: DC
  Administered 2014-08-03: 75 mg via ORAL
  Filled 2014-08-03: qty 1

## 2014-08-03 MED ORDER — OXYCODONE-ACETAMINOPHEN 10-325 MG PO TABS: 1.0000 | ORAL_TABLET | Freq: Four times a day (QID) | ORAL | Status: DC | PRN

## 2014-08-03 MED ORDER — IBUPROFEN 400 MG PO TABS
400.0000 mg | ORAL_TABLET | Freq: Four times a day (QID) | ORAL | Status: DC | PRN
  Administered 2014-08-03 – 2014-08-04 (×2): 400 mg via ORAL
  Filled 2014-08-03 (×5): qty 1

## 2014-08-03 MED ORDER — ACETAMINOPHEN 325 MG PO TABS: 650.0000 mg | ORAL_TABLET | Freq: Once | ORAL | Status: DC

## 2014-08-03 MED ORDER — DEXTROSE 5 % IV SOLN
1.0000 g | INTRAVENOUS | Status: DC
  Administered 2014-08-04 – 2014-08-05 (×2): 1 g via INTRAVENOUS
  Filled 2014-08-03 (×2): qty 10

## 2014-08-03 MED ORDER — CEFTRIAXONE SODIUM 1 G IJ SOLR
1.0000 g | Freq: Once | INTRAMUSCULAR | Status: AC
  Administered 2014-08-03: 1 g via INTRAVENOUS
  Filled 2014-08-03: qty 10

## 2014-08-03 MED ORDER — OXYCODONE-ACETAMINOPHEN 5-325 MG PO TABS
1.0000 | ORAL_TABLET | Freq: Four times a day (QID) | ORAL | Status: DC | PRN
  Administered 2014-08-04 – 2014-08-05 (×2): 1 via ORAL
  Filled 2014-08-03 (×2): qty 1

## 2014-08-03 MED ORDER — ONDANSETRON HCL 4 MG PO TABS: 4.0000 mg | ORAL_TABLET | Freq: Three times a day (TID) | ORAL | Status: DC | PRN

## 2014-08-03 NOTE — ED Notes (Signed)
Pt reports that he thinks he has the flu, has bodyaches, headache, fever since Friday and n/v yesterday. Mask on pt at triage, no acute distress noted.

## 2014-08-03 NOTE — ED Notes (Signed)
Pt went back to lobby and then reported having mid chest pains, feels better when leaning forward. Pt has hx of pericarditis. Temp 103 at triage.

## 2014-08-03 NOTE — ED Notes (Signed)
CT made aware finished with contrast.  

## 2014-08-03 NOTE — Progress Notes (Signed)
ANTIBIOTIC CONSULT NOTE - INITIAL  Pharmacy Consult for vancomycin Indication: pneumonia  Allergies  Allergen Reactions  . Bee Venom Anaphylaxis  . Shellfish Allergy Anaphylaxis and Hives    Patient Measurements:   Weight Last from 2014 - 76 kg Height 178 cm   Vital Signs: Temp: 104.1 F (40.1 C) (01/10 1446) Temp Source: Oral (01/10 1446) BP: 158/84 mmHg (01/10 1600) Pulse Rate: 119 (01/10 1600) Intake/Output from previous day:   Intake/Output from this shift:    Labs:  Recent Labs  08/03/14 1114  WBC 14.8*  HGB 11.6*  PLT 258  CREATININE 1.14   CrCl cannot be calculated (Unknown ideal weight.). No results for input(s): VANCOTROUGH, VANCOPEAK, VANCORANDOM, GENTTROUGH, GENTPEAK, GENTRANDOM, TOBRATROUGH, TOBRAPEAK, TOBRARND, AMIKACINPEAK, AMIKACINTROU, AMIKACIN in the last 72 hours.   Microbiology: No results found for this or any previous visit (from the past 720 hour(s)).  Medical History: Past Medical History  Diagnosis Date  . Hypercholesteremia   . Pericarditis ~ 2009  . Exertional shortness of breath     "at times" (11/14/2012)  . Rheumatoid arthritis(714.0)     Medications:  Anti-infectives    Start     Dose/Rate Route Frequency Ordered Stop   08/04/14 0600  azithromycin (ZITHROMAX) 500 mg in dextrose 5 % 250 mL IVPB     500 mg250 mL/hr over 60 Minutes Intravenous Every 24 hours 08/03/14 1629     08/04/14 0600  cefTRIAXone (ROCEPHIN) 1 g in dextrose 5 % 50 mL IVPB     1 g100 mL/hr over 30 Minutes Intravenous Every 24 hours 08/03/14 1629     08/03/14 2200  oseltamivir (TAMIFLU) capsule 75 mg  Status:  Discontinued     75 mg Oral 2 times daily 08/03/14 1600 08/03/14 1600   08/03/14 1630  oseltamivir (TAMIFLU) capsule 75 mg     75 mg Oral 2 times daily 08/03/14 1600 08/08/14 2159   08/03/14 1400  cefTRIAXone (ROCEPHIN) 1 g in dextrose 5 % 50 mL IVPB     1 g100 mL/hr over 30 Minutes Intravenous  Once 08/03/14 1357 08/03/14 1537   08/03/14 1400   azithromycin (ZITHROMAX) 500 mg in dextrose 5 % 250 mL IVPB     500 mg250 mL/hr over 60 Minutes Intravenous  Once 08/03/14 1357       Assessment: 49 year old male to be admitted with possible CAP on Azithromycin and Rocephin to add vancomycin per pharmacy dosing.   WBC 14.8, Tmax 104.1, SCr 1.14, normalized CrCl~75-80 Tachycardic, LA 0.78  1/10 Blood >> 1/10 Urine >>  Goal of Therapy:  Vancomycin trough level 15-20 mcg/ml  Plan:  Vancomycin 1250mg  IV once, then 1g IV q12h. Confirm patient's actual weight. Follow-up renal function, clinical status, and culture results.   , PharmD, BCPS Clinical Pharmacist [phone] 08/03/2014,4:32 PM

## 2014-08-03 NOTE — ED Notes (Signed)
Ordered diet tray 

## 2014-08-03 NOTE — Consult Note (Signed)
Reason for Consult: Abnormal cardiac enzymes, history of pericarditis.  Requesting Physician: Elvera Lennox  Cardiologist: Nahser  HPI: This is a 49 y.o. male with a past medical history significant for rheumatoid arthritis and previous acute pericarditis (2009), no CAD by cath 2010 and normal nuclear stress test 2014 presents with fever and chills, myalgia, weakness and similar symptoms in a family member. Radiological studies suggest RLL pneumonia. ECG is normal except tachycardia. Cardiac enzymes at upper limit of normal, without a dynamic pattern. No coronary calcium.  PMHx:  Past Medical History  Diagnosis Date  . Hypercholesteremia   . Pericarditis ~ 2009  . Exertional shortness of breath     "at times" (11/14/2012)  . Rheumatoid arthritis(714.0)    Past Surgical History  Procedure Laterality Date  . Cardiac catheterization  May 2010    normal  . Tonsillectomy  1970's?  . Appendectomy  1970's  . Anterior cervical decomp/discectomy fusion  11/14/2012  . Anterior cervical decomp/discectomy fusion N/A 11/14/2012    Procedure: ANTERIOR CERVICAL DECOMPRESSION/DISCECTOMY FUSION 1 LEVEL C5-6;  Surgeon: Venita Lick, MD;  Location: [facility] OR;  Service: Orthopedics;  Laterality: N/A;    FAMHx: Family History  Problem Relation Age of Onset  . Cirrhosis Father   . Hypertension Mother   . Stroke Mother   . Diabetes Mother   . Lupus Sister   . Lupus Sister     SOCHx:  reports that he has been smoking Cigarettes.  He has a 6.25 pack-year smoking history. He has never used smokeless tobacco. He reports that he drinks about 8.4 oz of alcohol per week. He reports that he does not use illicit drugs.  ALLERGIES: Allergies  Allergen Reactions  . Bee Venom Anaphylaxis  . Shellfish Allergy Anaphylaxis and Hives    ROS: Constitutional: positive for chills, fevers and sweats, negative for anorexia and weight loss Eyes: negative Ears, nose, mouth, throat, and face: positive for  nasal congestion and sore throat Respiratory: positive for pleurisy/chest pain and that improves leaning forward, negative for hemoptysis, sputum and wheezing Cardiovascular: negative Gastrointestinal: negative Genitourinary:negative for frequency and hesitancy Integument/breast: negative for rash Hematologic/lymphatic: negative for bleeding, easy bruising and lymphadenopathy Musculoskeletal:positive for myalgias Neurological: negative Behavioral/Psych: negative Endocrine: negative  HOME MEDICATIONS:  (Not in a hospital admission)  HOSPITAL MEDICATIONS: Prior to Admission:  (Not in a hospital admission) Scheduled: . enoxaparin (LOVENOX) injection  40 mg Subcutaneous Q24H  . oseltamivir  75 mg Oral BID    VITALS: Blood pressure 159/91, pulse 118, temperature 104.1 F (40.1 C), temperature source Oral, resp. rate 16, SpO2 99 %.  PHYSICAL EXAM:  General: Alert, oriented x3, no distress Head: no evidence of trauma, PERRL, EOMI, no exophtalmos or lid lag, no myxedema, no xanthelasma; normal ears, nose and oropharynx Neck: normal jugular venous pulsations and no hepatojugular reflux; brisk carotid pulses without delay and no carotid bruits Chest: clear to auscultation, no signs of consolidation by percussion or palpation, normal fremitus, symmetrical and full respiratory excursions Cardiovascular: normal position and quality of the apical impulse, regular rhythm, normal first heart sound and normal second heart sound, nogallops, no murmur. Distinct pericardial rub is heard Abdomen: no tenderness or distention, no masses by palpation, no abnormal pulsatility or arterial bruits, normal bowel sounds, no hepatosplenomegaly Extremities: no clubbing, cyanosis;  no edema; 2+ radial, ulnar and brachial pulses bilaterally; 2+ right femoral, posterior tibial and dorsalis pedis pulses; 2+ left femoral, posterior tibial and dorsalis pedis pulses; no subclavian or femoral bruits  Neurological:  grossly nonfocal   LABS  CBC  Recent Labs  08/03/14 1114  WBC 14.8*  NEUTROABS 12.9*  HGB 11.6*  HCT 36.2*  MCV 77.8*  PLT 258   Basic Metabolic Panel  Recent Labs  08/03/14 1114  NA 132*  K 3.6  CL 101  CO2 21  GLUCOSE 117*  BUN 9  CREATININE 1.14  CALCIUM 8.7   Liver Function Tests  Recent Labs  08/03/14 1114  AST 26  ALT 21  ALKPHOS 82  BILITOT 1.2  PROT 8.3  ALBUMIN 3.1*   No results for input(s): LIPASE, AMYLASE in the last 72 hours. Cardiac Enzymes  Recent Labs  08/03/14 1114 08/03/14 1414  TROPONINI 0.04* 0.04*   IMAGING: Ct Abdomen Pelvis W Contrast  08/03/2014   CLINICAL DATA:  Right abdominal pain, nausea/ vomiting, chills  EXAM: CT ABDOMEN AND PELVIS WITH CONTRAST  TECHNIQUE: Multidetector CT imaging of the abdomen and pelvis was performed using the standard protocol following bolus administration of intravenous contrast.  CONTRAST:  OMNIPAQUE IOHEXOL 300 MG/ML  SOLN  COMPARISON:  None.  FINDINGS: Lower chest:  Right lower lobe consolidation/pneumonia.  Hepatobiliary: Liver is within normal limits.  Gallbladder is unremarkable. No intrahepatic or extrahepatic ductal dilatation.  Pancreas: Within normal limits.  Spleen: Within normal limits.  Adrenals/Urinary Tract: Adrenal glands unremarkable.  Kidneys are notable for bilateral renal cysts, measuring up to 2.3 cm in the lateral interpolar left kidney (series 21/ image 27) and 2.6 cm in the lateral interpolar right kidney (series 201/ image 38). No hydronephrosis.  Mildly thick-walled bladder.  Stomach/Bowel: Stomach is grossly unremarkable.  No evidence of bowel obstruction.  Prior appendectomy.  Vascular/Lymphatic: Atherosclerotic calcifications of the abdominal aorta and branch vessels.  No suspicious abdominopelvic lymphadenopathy.  Reproductive: Prostate is unremarkable.  Other: No abdominopelvic ascites.  Musculoskeletal: Mild degenerative changes of the visualized thoracolumbar spine.   IMPRESSION: No evidence of bowel obstruction.  Prior appendectomy.  Mildly thick-walled bladder, correlate for cystitis.  Right lower lobe pneumonia.   Electronically Signed   By: Charline Bills M.D.   On: 08/03/2014 15:20   Dg Chest Port 1 View  08/03/2014   CLINICAL DATA:  Chest pain and shortness of breath for 2 days. Emesis for 1 day  EXAM: PORTABLE CHEST - 1 VIEW  COMPARISON:  November 07, 2012  FINDINGS: There is consolidation in the right lower lobe. Lungs elsewhere clear. Heart size and pulmonary vascularity are normal. No adenopathy. No bone lesions.  IMPRESSION: Right lower lobe airspace consolidation.  Elsewhere lungs clear.   Electronically Signed   By: Bretta Bang M.D.   On: 08/03/2014 12:39    ECG: Sinus tachy, questionable biatrial enlargement  TELEMETRY: STachy  IMPRESSION: 1. RLL pneumonia 2. Acute pericarditis, probably parapneumonic (although with history of previous pericarditis and RA, cannot exclude autoimmune pericarditis) 3. Minimal cardiac troponin I abnormality, not clinically significant and stable  RECOMMENDATION: 1. Treat pneumonia and use antiinflammatories for pleuritic pain 2. Echo - if pericardial effusion present, consider adding colchicine to reduce likelihood of recurrence. 3. Low suspicion for CAD  Time Spent Directly with Patient: 50 minutes  Thurmon Fair, MD, [facility] [phone] office [phone] pager   08/03/2014, 4:10 PM

## 2014-08-03 NOTE — H&P (Signed)
History and Physical    Dennis Harris XTK:[id number] DOB: [birth year] DOA: 08/03/2014  Referring physician: Dr. Littie Deeds  PCP: Katy Apo, MD  Specialists: Cardiology   Chief Complaint: fever/chills/weakness  HPI: AAKASH Harris is a 49 y.o. male has a past medical history significant for hyperlipidemia, prior history of pericarditis 2009, intermittent chest pain with a negative stress test in 2014, rheumatoid arthritis on methotrexate (hasn't had this for about few weeks), prednisone, presents to the emergency room with chief complaint of fever, chills and weakness progressively worsening since Friday. His daughter had flulike symptoms and he feels like he caught something from her. He describes myalgias, feeling "rundown" and fatigued. He also endorses GI upset with nausea, vomiting and diarrhea for the past 24 hours. He endorses chest pain, on and off for the past 24 hours. He endorses mild shortness of breath. He currently denies any chest pain. He denies any lightheadedness or dizziness. In the emergency room, Patient is febrile to 103-104, he is tachycardic with heart rate sustained in the 120s, he is normotensive, has a leukocytosis of 14.8 and mild troponin elevation at 0.04. His lactic acid is 0.78. He underwent a chest x-ray which showed right lower lobe airspace consolidation, as well as a CT scan of the abdomen and pelvis without any intra-abdominal acute findings. [facility] asked for admission for severe sepsis of pulmonary origin.  Review of Systems: As per history of present illness, otherwise 10 point review of system negative   Past Medical History  Diagnosis Date  . Hypercholesteremia   . Pericarditis ~ 2009  . Exertional shortness of breath     "at times" (11/14/2012)  . Rheumatoid arthritis(714.0)    Past Surgical History  Procedure Laterality Date  . Cardiac catheterization  May 2010    normal  . Tonsillectomy  1970's?  . Appendectomy  1970's  . Anterior  cervical decomp/discectomy fusion  11/14/2012  . Anterior cervical decomp/discectomy fusion N/A 11/14/2012    Procedure: ANTERIOR CERVICAL DECOMPRESSION/DISCECTOMY FUSION 1 LEVEL C5-6;  Surgeon: Venita Lick, MD;  Location: [facility] OR;  Service: Orthopedics;  Laterality: N/A;   Social History:  reports that he has been smoking Cigarettes.  He has a 6.25 pack-year smoking history. He has never used smokeless tobacco. He reports that he drinks about 8.4 oz of alcohol per week. He reports that he does not use illicit drugs.  Allergies  Allergen Reactions  . Bee Venom Anaphylaxis  . Shellfish Allergy Anaphylaxis and Hives    Family History  Problem Relation Age of Onset  . Cirrhosis Father   . Hypertension Mother   . Stroke Mother   . Diabetes Mother   . Lupus Sister   . Lupus Sister     Prior to Admission medications   Medication Sig Start Date End Date Taking? Authorizing Provider  Multiple Vitamin (MULTIVITAMIN) tablet Take 1 tablet by mouth daily.     Yes Historical Provider, MD  atorvastatin (LIPITOR) 10 MG tablet Take 10 mg by mouth daily.    Historical Provider, MD  docusate sodium (COLACE) 100 MG capsule Take 1 capsule (100 mg total) by mouth 3 (three) times daily as needed for constipation. 11/14/12   Venita Lick, MD  folic acid (FOLVITE) 1 MG tablet Take 1 mg by mouth daily.    Historical Provider, MD  methocarbamol (ROBAXIN) 500 MG tablet Take 1 tablet (500 mg total) by mouth 3 (three) times daily as needed. Patient not taking: Reported on 08/03/2014 11/14/12   Dahari  Shon Baton, MD  ondansetron (ZOFRAN) 4 MG tablet Take 1 tablet (4 mg total) by mouth every 8 (eight) hours as needed for nausea. Patient not taking: Reported on 08/03/2014 11/14/12   Venita Lick, MD  oxyCODONE-acetaminophen (PERCOCET) 10-325 MG per tablet Take 1 tablet by mouth every 6 (six) hours as needed for pain. Patient not taking: Reported on 08/03/2014 06/25/14   Fayrene Helper, PA-C  polyethylene glycol powder (GLYCOLAX)  powder Take 17 g by mouth daily. Patient not taking: Reported on 08/03/2014 11/14/12   Venita Lick, MD  predniSONE (DELTASONE) 20 MG tablet 2 tabs po daily x 4 days Patient not taking: Reported on 08/03/2014 06/25/14   Fayrene Helper, PA-C   Physical Exam: Filed Vitals:   08/03/14 1330 08/03/14 1445 08/03/14 1446 08/03/14 1530  BP: 175/65 165/84  159/91  Pulse: 114 121  118  Temp:   104.1 F (40.1 C)   TempSrc:   Oral   Resp:      SpO2: 97% 93%  99%     General:  In mild distress, tachypneic, sweating  Eyes: PERRL, no scleral icterus  ENT: moist oropharynx  Neck: supple, no lymphadenopathy  Cardiovascular: regular rate without MRG; tachycardic, 2+ peripheral pulses, no JVD, no peripheral edema  Respiratory: Coarse breath sounds right lower area, no wheezing  Abdomen: soft, mild tenderness to palpation throughout  Skin: no rashes  Musculoskeletal: normal bulk and tone, no joint swelling  Psychiatric: normal mood and affect  Neurologic: non focal  Labs on Admission:  Basic Metabolic Panel:  Recent Labs Lab 08/03/14 1114  NA 132*  K 3.6  CL 101  CO2 21  GLUCOSE 117*  BUN 9  CREATININE 1.14  CALCIUM 8.7   Liver Function Tests:  Recent Labs Lab 08/03/14 1114  AST 26  ALT 21  ALKPHOS 82  BILITOT 1.2  PROT 8.3  ALBUMIN 3.1*   CBC:  Recent Labs Lab 08/03/14 1114  WBC 14.8*  NEUTROABS 12.9*  HGB 11.6*  HCT 36.2*  MCV 77.8*  PLT 258   Cardiac Enzymes:  Recent Labs Lab 08/03/14 1114 08/03/14 1414  TROPONINI 0.04* 0.04*    Radiological Exams on Admission: Ct Abdomen Pelvis W Contrast  08/03/2014   CLINICAL DATA:  Right abdominal pain, nausea/ vomiting, chills  EXAM: CT ABDOMEN AND PELVIS WITH CONTRAST  TECHNIQUE: Multidetector CT imaging of the abdomen and pelvis was performed using the standard protocol following bolus administration of intravenous contrast.  CONTRAST:  OMNIPAQUE IOHEXOL 300 MG/ML  SOLN  COMPARISON:  None.  FINDINGS:  Lower chest:  Right lower lobe consolidation/pneumonia.  Hepatobiliary: Liver is within normal limits.  Gallbladder is unremarkable. No intrahepatic or extrahepatic ductal dilatation.  Pancreas: Within normal limits.  Spleen: Within normal limits.  Adrenals/Urinary Tract: Adrenal glands unremarkable.  Kidneys are notable for bilateral renal cysts, measuring up to 2.3 cm in the lateral interpolar left kidney (series 21/ image 27) and 2.6 cm in the lateral interpolar right kidney (series 201/ image 38). No hydronephrosis.  Mildly thick-walled bladder.  Stomach/Bowel: Stomach is grossly unremarkable.  No evidence of bowel obstruction.  Prior appendectomy.  Vascular/Lymphatic: Atherosclerotic calcifications of the abdominal aorta and branch vessels.  No suspicious abdominopelvic lymphadenopathy.  Reproductive: Prostate is unremarkable.  Other: No abdominopelvic ascites.  Musculoskeletal: Mild degenerative changes of the visualized thoracolumbar spine.  IMPRESSION: No evidence of bowel obstruction.  Prior appendectomy.  Mildly thick-walled bladder, correlate for cystitis.  Right lower lobe pneumonia.   Electronically Signed   By: Lurlean Horns  Rito Ehrlich M.D.   On: 08/03/2014 15:20   Dg Chest Port 1 View  08/03/2014   CLINICAL DATA:  Chest pain and shortness of breath for 2 days. Emesis for 1 day  EXAM: PORTABLE CHEST - 1 VIEW  COMPARISON:  November 07, 2012  FINDINGS: There is consolidation in the right lower lobe. Lungs elsewhere clear. Heart size and pulmonary vascularity are normal. No adenopathy. No bone lesions.  IMPRESSION: Right lower lobe airspace consolidation.  Elsewhere lungs clear.   Electronically Signed   By: Bretta Bang M.D.   On: 08/03/2014 12:39    EKG: Independently reviewed.  Assessment/Plan Principal Problem:   Severe sepsis Active Problems:   CAP (community acquired pneumonia)   Rheumatoid arthritis   Chest pain   Elevated troponin   Nausea vomiting and diarrhea   Severe sepsis  -  Patient with fever, tachycardia, elevated white count as well as a chest x-ray with pneumonia, as well as evidence of end organ dysfunction with troponin elevation -Admit to step down, start antibiotics for community-acquired pneumonia to SDU Per pneumonia pathway with ceftriaxone, azithromycin and vancomycin - Patient has quite characteristic flulike symptoms with high fever, we'll check influenza, start Tamiflu empirically and this can be discontinued if influenza returns negative - Blood cultures obtained - Patient febrile to 104, alternate Tylenol and Ibuprofen for fever control  Community-acquired pneumonia - Antibiotics as above with vancomycin, ceftriaxone, and azithromycin as well as Tamiflu - Droplet precautions - Patient has a degree of immunosuppression due to his chronic methotrexate use, although he hasn't been using this in the last 3 weeks, he is currently using his prednisone - Obtain urine legionella and strep pneumo, obtain sputum cultures  Rheumatoid arthritis - Continue his prednisone, will start stress dose steroids if he becomes hypotensive  Chest pain - Initial troponin was positive at 0.04, this is likely due to demand ischemia, cardiology was consulted by the ED physician, he had a stress test in 2014 which was negative  Elevated troponin - likely due to demand ischemia in the setting of #1 and #2 - Check 1 more troponin, and if flat no need to further test  Nausea, vomiting and diarrhea - ? Related to #2, possible viral, CT scan without acute findings, supportive treatment for now   Diet: Regular Fluids: NS at 125 cc/h DVT Prophylaxis: Lovenox  Code Status: Full  Family Communication: d/w patient  Disposition Plan: admit to SDU  Pauline Trainer M. Elvera Lennox, MD [facility] Pager [phone]  If 7PM-7AM, please contact night-coverage [email or web address] Password [email or web address] 08/03/2014, 4:04 PM

## 2014-08-03 NOTE — ED Provider Notes (Signed)
CSN: [id number]     Arrival date & time 08/03/14  6568 History   First MD Initiated Contact with Patient 08/03/14 1114     Chief Complaint  Patient presents with  . Generalized Body Aches  . Emesis     (Consider location/radiation/quality/duration/timing/severity/associated sxs/prior Treatment) The history is provided by the patient. No language interpreter was used.  Dennis Harris is a 49 year old male with past medical history of rheumatoid arthritis, pericarditis in 2009, hypercholesterolemia, cardiac cath performed in 2010 with minimal coronary artery disease presenting to the ED with generalized bodyaches, fever and chills and has been ongoing since Friday evening. Patient reported that the symptoms escalated yesterday. Stated that he feels weak all over and achy. Reported that he's been having nausea and vomiting since yesterday reported at least 2 episodes-NB/NB. Stated that he has been having a headache associated with his fever - reported that the fever gradually has gotten worse since yesterday. Patient reported that he's been using Alka-Seltzer plus and ibuprofen with minimal relief. Patient also reported that he's been having left sided chest pain that started this morning at approximately 3:00 AM describes a sharp shooting pain that is intermittent-reported that he feels better when he leans forward. Stated that daughter had similar symptoms yesterday. Denied nasal congestion, cough, sore throat, difficulty swallowing, ear pain, sudden loss of vision, numbness, tingling, back pain, neck pain, neck stiffness, travel, leg swelling.  PCP Dr. Nehemiah Settle  Past Medical History  Diagnosis Date  . Hypercholesteremia   . Pericarditis ~ 2009  . Exertional shortness of breath     "at times" (11/14/2012)  . Rheumatoid arthritis(714.0)    Past Surgical History  Procedure Laterality Date  . Cardiac catheterization  May 2010    normal  . Tonsillectomy  1970's?  . Appendectomy  1970's  .  Anterior cervical decomp/discectomy fusion  11/14/2012  . Anterior cervical decomp/discectomy fusion N/A 11/14/2012    Procedure: ANTERIOR CERVICAL DECOMPRESSION/DISCECTOMY FUSION 1 LEVEL C5-6;  Surgeon: Venita Lick, MD;  Location: [facility] OR;  Service: Orthopedics;  Laterality: N/A;   Family History  Problem Relation Age of Onset  . Cirrhosis Father   . Hypertension Mother   . Stroke Mother   . Diabetes Mother   . Lupus Sister   . Lupus Sister    History  Substance Use Topics  . Smoking status: Current Every Day Smoker -- 0.25 packs/day for 25 years    Types: Cigarettes  . Smokeless tobacco: Never Used     Comment: 11/14/2012 offered smoking cessation materials; pt declines  . Alcohol Use: 8.4 oz/week    14 Cans of beer per week     Comment: 11/14/2012 "2 beers/day"    Review of Systems  Constitutional: Positive for fever and chills.  Eyes: Positive for visual disturbance.  Respiratory: Negative for cough, chest tightness and shortness of breath.   Cardiovascular: Positive for chest pain.  Gastrointestinal: Positive for nausea, vomiting and abdominal pain. Negative for diarrhea, constipation, blood in stool and anal bleeding.  Musculoskeletal: Negative for back pain and neck pain.  Neurological: Positive for headaches. Negative for dizziness, weakness and numbness.      Allergies  Bee venom and Shellfish allergy  Home Medications   Prior to Admission medications   Medication Sig Start Date End Date Taking? Authorizing Provider  Multiple Vitamin (MULTIVITAMIN) tablet Take 1 tablet by mouth daily.     Yes Historical Provider, MD  atorvastatin (LIPITOR) 10 MG tablet Take 10 mg by mouth daily.  Historical Provider, MD  docusate sodium (COLACE) 100 MG capsule Take 1 capsule (100 mg total) by mouth 3 (three) times daily as needed for constipation. 11/14/12   Venita Lick, MD  folic acid (FOLVITE) 1 MG tablet Take 1 mg by mouth daily.    Historical Provider, MD  methocarbamol  (ROBAXIN) 500 MG tablet Take 1 tablet (500 mg total) by mouth 3 (three) times daily as needed. Patient not taking: Reported on 08/03/2014 11/14/12   Venita Lick, MD  ondansetron (ZOFRAN) 4 MG tablet Take 1 tablet (4 mg total) by mouth every 8 (eight) hours as needed for nausea. Patient not taking: Reported on 08/03/2014 11/14/12   Venita Lick, MD  oxyCODONE-acetaminophen (PERCOCET) 10-325 MG per tablet Take 1 tablet by mouth every 6 (six) hours as needed for pain. Patient not taking: Reported on 08/03/2014 06/25/14   Fayrene Helper, PA-C  polyethylene glycol powder (GLYCOLAX) powder Take 17 g by mouth daily. Patient not taking: Reported on 08/03/2014 11/14/12   Venita Lick, MD  predniSONE (DELTASONE) 20 MG tablet 2 tabs po daily x 4 days Patient not taking: Reported on 08/03/2014 06/25/14   Fayrene Helper, PA-C   BP 159/91 mmHg  Pulse 118  Temp(Src) 104.1 F (40.1 C) (Oral)  Resp 16  SpO2 99% Physical Exam  Constitutional: He is oriented to person, place, and time. He appears well-developed and well-nourished. No distress.  HENT:  Head: Normocephalic and atraumatic.  Mouth/Throat: Oropharynx is clear and moist. No oropharyngeal exudate.  Eyes: Conjunctivae and EOM are normal. Pupils are equal, round, and reactive to light. Right eye exhibits no discharge. Left eye exhibits no discharge.  Neck: Normal range of motion. Neck supple. No tracheal deviation present.  Negative neck stiffness Negative nuchal rigidity Negative cervical lymphadenopathy Negative meningeal signs  Cardiovascular: Regular rhythm and normal heart sounds.  Tachycardia present.  Exam reveals no friction rub.   No murmur heard. Pulses:      Radial pulses are 2+ on the right side, and 2+ on the left side.  Negative swelling localized to lower extremities bilaterally-negative pitting edema Cap refill less than 3 seconds  Pulmonary/Chest: Effort normal and breath sounds normal. No respiratory distress. He has no wheezes. He has no  rales. He exhibits no tenderness.  Patient is able to speak in full senses that difficulty Negative use of accessory muscles Negative stridor Negative pain upon palpation to the chest wall  Abdominal: Soft. Bowel sounds are normal. He exhibits no distension. There is tenderness. There is no rebound and no guarding.  Negative abdominal distention Bowel sounds normal active in all 4 quadrants Abdomen soft upon palpation Discomfort upon palpation to the upper quadrant of the abdomen, right more so than left Negative peritoneal signs  Musculoskeletal: Normal range of motion.  Lymphadenopathy:    He has no cervical adenopathy.  Neurological: He is alert and oriented to person, place, and time. No cranial nerve deficit. He exhibits normal muscle tone. Coordination normal.  Skin: Skin is warm and dry. No rash noted. He is not diaphoretic. No erythema.  Psychiatric: He has a normal mood and affect. His behavior is normal. Thought content normal.  Nursing note and vitals reviewed.   ED Course  Procedures (including critical care time)  Results for orders placed or performed during the hospital encounter of 08/03/14  CBC with Differential  Result Value Ref Range   WBC 14.8 (H) 4.0 - 10.5 K/uL   RBC 4.65 4.22 - 5.81 MIL/uL   Hemoglobin 11.6 (L)  13.0 - 17.0 g/dL   HCT 80.0 (L) 34.9 - 17.9 %   MCV 77.8 (L) 78.0 - 100.0 fL   MCH 24.9 (L) 26.0 - 34.0 pg   MCHC 32.0 30.0 - 36.0 g/dL   RDW 15.0 56.9 - 79.4 %   Platelets 258 150 - 400 K/uL   Neutrophils Relative % 87 (H) 43 - 77 %   Neutro Abs 12.9 (H) 1.7 - 7.7 K/uL   Lymphocytes Relative 7 (L) 12 - 46 %   Lymphs Abs 1.0 0.7 - 4.0 K/uL   Monocytes Relative 6 3 - 12 %   Monocytes Absolute 0.9 0.1 - 1.0 K/uL   Eosinophils Relative 0 0 - 5 %   Eosinophils Absolute 0.0 0.0 - 0.7 K/uL   Basophils Relative 0 0 - 1 %   Basophils Absolute 0.0 0.0 - 0.1 K/uL  Comprehensive metabolic panel  Result Value Ref Range   Sodium 132 (L) 135 - 145 mmol/L    Potassium 3.6 3.5 - 5.1 mmol/L   Chloride 101 96 - 112 mEq/L   CO2 21 19 - 32 mmol/L   Glucose, Bld 117 (H) 70 - 99 mg/dL   BUN 9 6 - 23 mg/dL   Creatinine, Ser 8.01 0.50 - 1.35 mg/dL   Calcium 8.7 8.4 - 65.5 mg/dL   Total Protein 8.3 6.0 - 8.3 g/dL   Albumin 3.1 (L) 3.5 - 5.2 g/dL   AST 26 0 - 37 U/L   ALT 21 0 - 53 U/L   Alkaline Phosphatase 82 39 - 117 U/L   Total Bilirubin 1.2 0.3 - 1.2 mg/dL   GFR calc non Af Amer 74 (L) >90 mL/min   GFR calc Af Amer 86 (L) >90 mL/min   Anion gap 10 5 - 15  Troponin I  Result Value Ref Range   Troponin I 0.04 (H) <0.031 ng/mL  Urinalysis, Routine w reflex microscopic  Result Value Ref Range   Color, Urine YELLOW YELLOW   APPearance CLEAR CLEAR   Specific Gravity, Urine 1.014 1.005 - 1.030   pH 6.5 5.0 - 8.0   Glucose, UA NEGATIVE NEGATIVE mg/dL   Hgb urine dipstick SMALL (A) NEGATIVE   Bilirubin Urine NEGATIVE NEGATIVE   Ketones, ur 15 (A) NEGATIVE mg/dL   Protein, ur 374 (A) NEGATIVE mg/dL   Urobilinogen, UA 4.0 (H) 0.0 - 1.0 mg/dL   Nitrite NEGATIVE NEGATIVE   Leukocytes, UA NEGATIVE NEGATIVE  Troponin I  Result Value Ref Range   Troponin I 0.04 (H) <0.031 ng/mL  Urine microscopic-add on  Result Value Ref Range   RBC / HPF 0-2 <3 RBC/hpf   Urine-Other AMORPHOUS URATES/PHOSPHATES   I-Stat CG4 Lactic Acid, ED  Result Value Ref Range   Lactic Acid, Venous 0.78 0.5 - 2.2 mmol/L    Labs Review Labs Reviewed  CBC WITH DIFFERENTIAL - Abnormal; Notable for the following:    WBC 14.8 (*)    Hemoglobin 11.6 (*)    HCT 36.2 (*)    MCV 77.8 (*)    MCH 24.9 (*)    Neutrophils Relative % 87 (*)    Neutro Abs 12.9 (*)    Lymphocytes Relative 7 (*)    All other components within normal limits  COMPREHENSIVE METABOLIC PANEL - Abnormal; Notable for the following:    Sodium 132 (*)    Glucose, Bld 117 (*)    Albumin 3.1 (*)    GFR calc non Af Amer 74 (*)  GFR calc Af Amer 86 (*)    All other components within normal limits   TROPONIN I - Abnormal; Notable for the following:    Troponin I 0.04 (*)    All other components within normal limits  URINALYSIS, ROUTINE W REFLEX MICROSCOPIC - Abnormal; Notable for the following:    Hgb urine dipstick SMALL (*)    Ketones, ur 15 (*)    Protein, ur 100 (*)    Urobilinogen, UA 4.0 (*)    All other components within normal limits  TROPONIN I - Abnormal; Notable for the following:    Troponin I 0.04 (*)    All other components within normal limits  CULTURE, BLOOD (ROUTINE X 2)  CULTURE, BLOOD (ROUTINE X 2)  URINE CULTURE  CULTURE, EXPECTORATED SPUTUM-ASSESSMENT  GRAM STAIN  URINE MICROSCOPIC-ADD ON  INFLUENZA PANEL BY PCR (TYPE A & B, H1N1)  HIV ANTIBODY (ROUTINE TESTING)  LEGIONELLA ANTIGEN, URINE  STREP PNEUMONIAE URINARY ANTIGEN  I-STAT CG4 LACTIC ACID, ED    Imaging Review Ct Abdomen Pelvis W Contrast  08/03/2014   CLINICAL DATA:  Right abdominal pain, nausea/ vomiting, chills  EXAM: CT ABDOMEN AND PELVIS WITH CONTRAST  TECHNIQUE: Multidetector CT imaging of the abdomen and pelvis was performed using the standard protocol following bolus administration of intravenous contrast.  CONTRAST:  OMNIPAQUE IOHEXOL 300 MG/ML  SOLN  COMPARISON:  None.  FINDINGS: Lower chest:  Right lower lobe consolidation/pneumonia.  Hepatobiliary: Liver is within normal limits.  Gallbladder is unremarkable. No intrahepatic or extrahepatic ductal dilatation.  Pancreas: Within normal limits.  Spleen: Within normal limits.  Adrenals/Urinary Tract: Adrenal glands unremarkable.  Kidneys are notable for bilateral renal cysts, measuring up to 2.3 cm in the lateral interpolar left kidney (series 21/ image 27) and 2.6 cm in the lateral interpolar right kidney (series 201/ image 38). No hydronephrosis.  Mildly thick-walled bladder.  Stomach/Bowel: Stomach is grossly unremarkable.  No evidence of bowel obstruction.  Prior appendectomy.  Vascular/Lymphatic: Atherosclerotic calcifications of the  abdominal aorta and branch vessels.  No suspicious abdominopelvic lymphadenopathy.  Reproductive: Prostate is unremarkable.  Other: No abdominopelvic ascites.  Musculoskeletal: Mild degenerative changes of the visualized thoracolumbar spine.  IMPRESSION: No evidence of bowel obstruction.  Prior appendectomy.  Mildly thick-walled bladder, correlate for cystitis.  Right lower lobe pneumonia.   Electronically Signed   By: Charline Bills M.D.   On: 08/03/2014 15:20   Dg Chest Port 1 View  08/03/2014   CLINICAL DATA:  Chest pain and shortness of breath for 2 days. Emesis for 1 day  EXAM: PORTABLE CHEST - 1 VIEW  COMPARISON:  November 07, 2012  FINDINGS: There is consolidation in the right lower lobe. Lungs elsewhere clear. Heart size and pulmonary vascularity are normal. No adenopathy. No bone lesions.  IMPRESSION: Right lower lobe airspace consolidation.  Elsewhere lungs clear.   Electronically Signed   By: Bretta Bang M.D.   On: 08/03/2014 12:39     EKG Interpretation   Date/Time:  Sunday August 03 2014 10:20:22 EST Ventricular Rate:  124 PR Interval:  128 QRS Duration: 80 QT Interval:  298 QTC Calculation: 428 R Axis:   89 Text Interpretation:  Sinus tachycardia Biatrial enlargement Septal  infarct , age undetermined Abnormal ECG SINCE LAST TRACING HEART RATE HAS  INCREASED Confirmed by Mirian Mo [phone]) on 08/03/2014 1:47:39 PM       3:16 PM This provider spoke with Dr. Rennis Golden regarding concerns with patient's history and mildly elevated troponins. Discussed  case, ED course, in great detail. Patient to be seen by Cardiology.   3:36 PM This provider spoke with Dr. Elvera Lennox, [facility]. Discussed case in great detail. As per physician recommended patient to be placed on Cefepime and Vancomycin secondary to being immunocompromised from chronic prednisone use for RA. Patient to be admitted to [facility].   4:02 PM Dr. Elvera Lennox reported that patient will be going to ICU. Reported  that instead to keep patient on Ceftriaxone, Azithromycin - physician will add on vancomycin. Admitting physician reported that he will placed those orders in.   MDM   Final diagnoses:  Sepsis, due to unspecified organism  CAP (community acquired pneumonia)    Medications  acetaminophen (TYLENOL) tablet 650 mg (650 mg Oral Given 08/03/14 1542)  azithromycin (ZITHROMAX) 500 mg in dextrose 5 % 250 mL IVPB (500 mg Intravenous New Bag/Given 08/03/14 1535)  enoxaparin (LOVENOX) injection 40 mg (not administered)  oseltamivir (TAMIFLU) capsule 75 mg (not administered)  0.9 %  sodium chloride infusion (not administered)  ibuprofen (ADVIL,MOTRIN) tablet 400 mg (not administered)  sodium chloride 0.9 % bolus 1,000 mL (0 mLs Intravenous Stopped 08/03/14 1317)  cefTRIAXone (ROCEPHIN) 1 g in dextrose 5 % 50 mL IVPB (0 g Intravenous Stopped 08/03/14 1537)  iohexol (OMNIPAQUE) 300 MG/ML solution 100 mL (100 mLs Intravenous Contrast Given 08/03/14 1428)  sodium chloride 0.9 % bolus 1,000 mL (0 mLs Intravenous Stopped 08/03/14 1537)   Filed Vitals:   08/03/14 1330 08/03/14 1445 08/03/14 1446 08/03/14 1530  BP: 175/65 165/84  159/91  Pulse: 114 121  118  Temp:   104.1 F (40.1 C)   TempSrc:   Oral   Resp:      SpO2: 97% 93%  99%   EKG noted sinus tachycardia with a heart rate of 124 bpm. Troponin elevated at 0.04. CBC noted elevated white blood cell count of 14.8. Hemoglobin 11.6, hematocrit 36.2. CMP noted mildly low sodium-132. Glucose 117. Anion gap negative elevation-10.0 mg/L. Urinalysis negative for infection. Chest x-ray noted right lower lobe airspace consolidation elsewhere lungs are clear. CT abdomen pelvis with contrast evidence of bowel obstruction-prior appendectomy identified. Mildly thick walled bladder, correlate for cystitis. Right lower lobe pneumonia identified. Blood cultures x 2 pending.  Upon arrival to the emergency department, patient had a fever of 103.17F. Patient placed on IV  fluids and Tylenol administered. Heart rate decreased from 124 bpm to 103 bpm. Negative drop in pulse ox. Patient started on IV antibiotic swelling to setting. Blood cultures obtained and pending. Discussed case in great detail with Cardiology, Cardiology to come and assess patient. Discussed case in great detail with [facility]. Patient to be admitted to [facility] for sepsis from CAP. Discussed plan for admission with patient who understood and agrees to plan of care. Patient stable for transfer.   Raymon Mutton, PA-C 08/03/14 1615  Mirian Mo, MD 08/05/14 [phone]

## 2014-08-04 DIAGNOSIS — E43 Unspecified severe protein-calorie malnutrition: Secondary | ICD-10-CM | POA: Diagnosis present

## 2014-08-04 DIAGNOSIS — I309 Acute pericarditis, unspecified: Secondary | ICD-10-CM | POA: Diagnosis present

## 2014-08-04 DIAGNOSIS — E871 Hypo-osmolality and hyponatremia: Secondary | ICD-10-CM | POA: Diagnosis present

## 2014-08-04 LAB — COMPREHENSIVE METABOLIC PANEL
ALT: 20 U/L (ref 0–53)
ANION GAP: 5 (ref 5–15)
AST: 30 U/L (ref 0–37)
Albumin: 2.7 g/dL — ABNORMAL LOW (ref 3.5–5.2)
Alkaline Phosphatase: 72 U/L (ref 39–117)
BUN: 8 mg/dL (ref 6–23)
CO2: 19 mmol/L (ref 19–32)
Calcium: 7.9 mg/dL — ABNORMAL LOW (ref 8.4–10.5)
Chloride: 105 mEq/L (ref 96–112)
Creatinine, Ser: 0.96 mg/dL (ref 0.50–1.35)
Glucose, Bld: 119 mg/dL — ABNORMAL HIGH (ref 70–99)
Potassium: 3.7 mmol/L (ref 3.5–5.1)
SODIUM: 129 mmol/L — AB (ref 135–145)
TOTAL PROTEIN: 6.7 g/dL (ref 6.0–8.3)
Total Bilirubin: 1 mg/dL (ref 0.3–1.2)

## 2014-08-04 LAB — URINE CULTURE
COLONY COUNT: NO GROWTH
Culture: NO GROWTH
Special Requests: NORMAL

## 2014-08-04 LAB — CBC
HCT: 33.2 % — ABNORMAL LOW (ref 39.0–52.0)
Hemoglobin: 10.4 g/dL — ABNORMAL LOW (ref 13.0–17.0)
MCH: 24.5 pg — ABNORMAL LOW (ref 26.0–34.0)
MCHC: 31.3 g/dL (ref 30.0–36.0)
MCV: 78.1 fL (ref 78.0–100.0)
Platelets: 220 10*3/uL (ref 150–400)
RBC: 4.25 MIL/uL (ref 4.22–5.81)
RDW: 13.5 % (ref 11.5–15.5)
WBC: 13.9 10*3/uL — AB (ref 4.0–10.5)

## 2014-08-04 LAB — HIV ANTIBODY (ROUTINE TESTING W REFLEX): HIV-1/HIV-2 Ab: NONREACTIVE

## 2014-08-04 LAB — GLUCOSE, CAPILLARY: Glucose-Capillary: 145 mg/dL — ABNORMAL HIGH (ref 70–99)

## 2014-08-04 MED ORDER — PNEUMOCOCCAL VAC POLYVALENT 25 MCG/0.5ML IJ INJ
0.5000 mL | INJECTION | INTRAMUSCULAR | Status: AC
  Administered 2014-08-08: 0.5 mL via INTRAMUSCULAR
  Filled 2014-08-04 (×2): qty 0.5

## 2014-08-04 MED ORDER — VANCOMYCIN HCL IN DEXTROSE 1-5 GM/200ML-% IV SOLN
1000.0000 mg | Freq: Two times a day (BID) | INTRAVENOUS | Status: DC
  Administered 2014-08-04 (×2): 1000 mg via INTRAVENOUS
  Filled 2014-08-04 (×4): qty 200

## 2014-08-04 MED ORDER — ACETAMINOPHEN 325 MG PO TABS
650.0000 mg | ORAL_TABLET | Freq: Four times a day (QID) | ORAL | Status: DC | PRN
  Administered 2014-08-05 – 2014-08-08 (×4): 650 mg via ORAL
  Filled 2014-08-04 (×4): qty 2

## 2014-08-04 MED ORDER — ENSURE COMPLETE PO LIQD
237.0000 mL | Freq: Three times a day (TID) | ORAL | Status: DC
  Administered 2014-08-04 – 2014-08-08 (×5): 237 mL via ORAL

## 2014-08-04 MED ORDER — PANTOPRAZOLE SODIUM 40 MG PO TBEC
40.0000 mg | DELAYED_RELEASE_TABLET | Freq: Every day | ORAL | Status: DC
  Administered 2014-08-05 – 2014-08-08 (×4): 40 mg via ORAL
  Filled 2014-08-04: qty 1

## 2014-08-04 MED ORDER — INFLUENZA VAC SPLIT QUAD 0.5 ML IM SUSY
0.5000 mL | PREFILLED_SYRINGE | INTRAMUSCULAR | Status: AC
  Administered 2014-08-08: 0.5 mL via INTRAMUSCULAR
  Filled 2014-08-04 (×2): qty 0.5

## 2014-08-04 MED ORDER — IBUPROFEN 800 MG PO TABS
800.0000 mg | ORAL_TABLET | Freq: Three times a day (TID) | ORAL | Status: DC
  Administered 2014-08-04 – 2014-08-08 (×11): 800 mg via ORAL
  Filled 2014-08-04 (×14): qty 1

## 2014-08-04 MED ORDER — PNEUMOCOCCAL VAC POLYVALENT 25 MCG/0.5ML IJ INJ: 0.5000 mL | INJECTION | INTRAMUSCULAR | Status: DC

## 2014-08-04 NOTE — Progress Notes (Signed)
Cardiologist: Nahser Subjective:  Fever overnight. Cough. Weak RLL PNA. Right chest pain.   Objective:  Vital Signs in the last 24 hours: Temp:  [98 F (36.7 C)-104.1 F (40.1 C)] 99.5 F (37.5 C) (01/11 0742) Pulse Rate:  [81-121] 99 (01/11 0742) Resp:  [16-33] 24 (01/11 0742) BP: (103-175)/(65-98) 103/76 mmHg (01/11 0742) SpO2:  [91 %-100 %] 98 % (01/11 0742) Weight:  [147 lb 7.8 oz (66.9 kg)] 147 lb 7.8 oz (66.9 kg) (01/10 1920)  Intake/Output from previous day: 01/10 0701 - 01/11 0700 In: 1642.9 [P.O.:120; I.V.:1222.9; IV Piggyback:300] Out: 1700 [Urine:1700]   Physical Exam: General: Well developed, well nourished, in no acute distress. Head:  Normocephalic and atraumatic. Lungs: RLL coarse BS. Heart: Tachy S1 and S2.  No murmur, no significant rub this AM (previously described), no gallops.  Abdomen: soft, non-tender, positive bowel sounds. Extremities: No clubbing or cyanosis. No edema. Neurologic: Alert and oriented x 3.    Lab Results:  Recent Labs  08/03/14 1114 08/04/14 0310  WBC 14.8* 13.9*  HGB 11.6* 10.4*  PLT 258 220    Recent Labs  08/03/14 1114 08/04/14 0310  NA 132* 129*  K 3.6 3.7  CL 101 105  CO2 21 19  GLUCOSE 117* 119*  BUN 9 8  CREATININE 1.14 0.96    Recent Labs  08/03/14 1414 08/03/14 2216  TROPONINI 0.04* 0.04*   Hepatic Function Panel  Recent Labs  08/04/14 0310  PROT 6.7  ALBUMIN 2.7*  AST 30  ALT 20  ALKPHOS 72  BILITOT 1.0  Imaging: Ct Abdomen Pelvis W Contrast  08/03/2014   CLINICAL DATA:  Right abdominal pain, nausea/ vomiting, chills  EXAM: CT ABDOMEN AND PELVIS WITH CONTRAST  TECHNIQUE: Multidetector CT imaging of the abdomen and pelvis was performed using the standard protocol following bolus administration of intravenous contrast.  CONTRAST:  OMNIPAQUE IOHEXOL 300 MG/ML  SOLN  COMPARISON:  None.  FINDINGS: Lower chest:  Right lower lobe consolidation/pneumonia.  Hepatobiliary: Liver is within  normal limits.  Gallbladder is unremarkable. No intrahepatic or extrahepatic ductal dilatation.  Pancreas: Within normal limits.  Spleen: Within normal limits.  Adrenals/Urinary Tract: Adrenal glands unremarkable.  Kidneys are notable for bilateral renal cysts, measuring up to 2.3 cm in the lateral interpolar left kidney (series 21/ image 27) and 2.6 cm in the lateral interpolar right kidney (series 201/ image 38). No hydronephrosis.  Mildly thick-walled bladder.  Stomach/Bowel: Stomach is grossly unremarkable.  No evidence of bowel obstruction.  Prior appendectomy.  Vascular/Lymphatic: Atherosclerotic calcifications of the abdominal aorta and branch vessels.  No suspicious abdominopelvic lymphadenopathy.  Reproductive: Prostate is unremarkable.  Other: No abdominopelvic ascites.  Musculoskeletal: Mild degenerative changes of the visualized thoracolumbar spine.  IMPRESSION: No evidence of bowel obstruction.  Prior appendectomy.  Mildly thick-walled bladder, correlate for cystitis.  Right lower lobe pneumonia.   Electronically Signed   By: Charline Bills M.D.   On: 08/03/2014 15:20   Dg Chest Port 1 View  08/03/2014   CLINICAL DATA:  Chest pain and shortness of breath for 2 days. Emesis for 1 day  EXAM: PORTABLE CHEST - 1 VIEW  COMPARISON:  November 07, 2012  FINDINGS: There is consolidation in the right lower lobe. Lungs elsewhere clear. Heart size and pulmonary vascularity are normal. No adenopathy. No bone lesions.  IMPRESSION: Right lower lobe airspace consolidation.  Elsewhere lungs clear.   Electronically Signed   By: Bretta Bang M.D.   On: 08/03/2014 12:39  Personally viewed.   Telemetry:Sinus tachy Personally viewed.   EKG:  Sinus tachy, no ST elevation  Cardiac Studies:  ECHO pending  Scheduled Meds: . atorvastatin  10 mg Oral Daily  . azithromycin  500 mg Intravenous Q24H  . cefTRIAXone (ROCEPHIN)  IV  1 g Intravenous Q24H  . enoxaparin (LOVENOX) injection  40 mg Subcutaneous Q24H    . [START ON 08/05/2014] pneumococcal 23 valent vaccine  0.5 mL Intramuscular Tomorrow-1000  . predniSONE  20 mg Oral Q breakfast  . vancomycin  1,000 mg Intravenous Q12H   Continuous Infusions: . sodium chloride 125 mL/hr at 08/03/14 1713   PRN Meds:.acetaminophen, ibuprofen, methocarbamol, ondansetron (ZOFRAN) IV, ondansetron, oxyCODONE-acetaminophen **AND** oxyCODONE  Assessment/Plan:   49 year old with acute pericarditis in the setting of RLL pneumonia. History of pericarditis and RA.   Pericarditis  - ECHO pending  - If effusion present add colchicine  - This is reason for elevation mild troponin (not ACS).   Pneumonia  - per [facility] team  - IV ABX  Hyponatremia  - per [facility] team (possibly from lung process)  Tachycardia  - compensatory  - no evidence of Tamponade   Will follow.     Jarreau Callanan 08/04/2014, 8:51 AM

## 2014-08-04 NOTE — Progress Notes (Signed)
08/04/2014 2:32 PM  Pt transferred to 6E28.  Pt is fully alert oriented, ambulates with standby assist and appears steady on his feet.  Pt c/o 5/10 RUQ abdominal pain after endorsing receiving pain medication on previous unit prior to transfer.  Full assessment to EPIC, vitals stable.  Skin is intact.  Tele box #20 placed on patient and confirmed with CCMD.  Pt oriented to room/unit, and was instructed on how to utilize the call bell, to which he verbalized understanding.  Will continue to monitor patient. Theadora Rama

## 2014-08-04 NOTE — Progress Notes (Signed)
D[facility] - Stepdown / ICU Progress Note  Dennis Harris:[id number] DOB: [birth year] DOA: 08/03/2014 PCP: Katy Apo, MD  Brief narrative: 49 year old male patient with history of pericarditis in 2009, intermittent chest pain with normal stress test in 2014, rheumatoid arthritis on methotrexate noting he has not taken this for several weeks, and chronic prednisone as well who presented to the emergency department with complaints of fever/chills and weakness progressively worsening since 1/8. He reported his daughter had flulike symptoms and he felt he had caught something from her. He was also having issues with nausea vomiting and diarrhea for 24 hours. In the ER he was febrile with a temperature of about 104F. He was tachycardic with sustained heart rate 120s but normotensive. He had a leukocytosis of 14,800 and a mild elevation in troponin with a nonischemic EKG. Chest x-ray showed right lower lobe airspace consolidation. CT of the abdomen and pelvis was without any acute intra-abdominal findings.  Since admission he has been evaluated by Cardiology who have determined that his chest pain is related to pericarditis despite the absence of ST segment changes on EKG. They have ordered an echocardiogram and if effusion is present plan on adding colchicine. They also suspect that the mild elevation in troponin is related to acute pericarditis. In regards to his respiratory symptoms his influenza PCR was negative. He remains stable and on room air.  HPI/Subjective: Patient alert and stating previous chest discomfort has improved somewhat but primary complaint is of generalized malaise and feeling like he is freezing. Note not long after my interaction with the patient he spiked a temperature of greater than 101.  Assessment/Plan:    CAP  -stable on room air-cont supportive of care and empiric antibiotics-add flutter valve-hemodynamically stable and no signs of sepsis    Acute  pericarditis -Appreciate cardiology assistance-follow-up echo-add colchicine if effusion present-cont home dose prednisone    Chest pain/ Elevated troponin -Per cards secondary to pericarditis    Acute hyponatremia -Sodium 132 at presentation and now down to 129-continue normal saline and follow - appear to be hypovolemic    Rheumatoid arthritis -cont home dose prednisone    Nausea vomiting and diarrhea -Resolved at this point   DVT prophylaxis: SCDs Code Status: Full Family Communication: No family at bedside Disposition Plan/Expected LOS: Transfer to telemetry  Consultants: Cardiology  Procedures: 2-D echocardiogram pending  Antibiotics: Zithromax 1/10 > Rocephin 1/10 > Vancomycin 1/10 > 1/11  Objective: Blood pressure 141/85, pulse 114, temperature 101.7 F (38.7 C), temperature source Oral, resp. rate 26, height 5' 10.5" (1.791 m), weight 147 lb 7.8 oz (66.9 kg), SpO2 95 %.  Intake/Output Summary (Last 24 hours) at 08/04/14 1352 Last data filed at 08/04/14 1215  Gross per 24 hour  Intake 3187.92 ml  Output   2500 ml  Net 687.92 ml   Exam: Gen: No acute respiratory distress-mildly toxic appearing and complaining of generalized myalgias Chest: Clear to auscultation bilaterally without wheezes, rhonchi or crackles, room air Cardiac: Regular tachycardic rate and rhythm, S1-S2, no rubs murmurs or gallops, no peripheral edema, no JVD Abdomen: Soft nontender nondistended without obvious hepatosplenomegaly, no ascites Extremities: Symmetrical in appearance without cyanosis, clubbing or effusion  Scheduled Meds:  Scheduled Meds: . atorvastatin  10 mg Oral Daily  . azithromycin  500 mg Intravenous Q24H  . cefTRIAXone (ROCEPHIN)  IV  1 g Intravenous Q24H  . enoxaparin (LOVENOX) injection  40 mg Subcutaneous Q24H  . feeding supplement (ENSURE COMPLETE)  237 mL  Oral TID BM  . [START ON 08/05/2014] pneumococcal 23 valent vaccine  0.5 mL Intramuscular Tomorrow-1000  .  predniSONE  20 mg Oral Q breakfast  . vancomycin  1,000 mg Intravenous Q12H   Data Reviewed: Basic Metabolic Panel:  Recent Labs Lab 08/03/14 1114 08/04/14 0310  NA 132* 129*  K 3.6 3.7  CL 101 105  CO2 21 19  GLUCOSE 117* 119*  BUN 9 8  CREATININE 1.14 0.96  CALCIUM 8.7 7.9*   Liver Function Tests:  Recent Labs Lab 08/03/14 1114 08/04/14 0310  AST 26 30  ALT 21 20  ALKPHOS 82 72  BILITOT 1.2 1.0  PROT 8.3 6.7  ALBUMIN 3.1* 2.7*   CBC:  Recent Labs Lab 08/03/14 1114 08/04/14 0310  WBC 14.8* 13.9*  NEUTROABS 12.9*  --   HGB 11.6* 10.4*  HCT 36.2* 33.2*  MCV 77.8* 78.1  PLT 258 220   Cardiac Enzymes:  Recent Labs Lab 08/03/14 1114 08/03/14 1414 08/03/14 2216  TROPONINI 0.04* 0.04* 0.04*    Recent Results (from the past 240 hour(s))  Culture, blood (routine x 2)     Status: None (Preliminary result)   Collection Time: 08/03/14  2:14 PM  Result Value Ref Range Status   Specimen Description BLOOD LEFT ARM  Final   Special Requests BOTTLES DRAWN AEROBIC AND ANAEROBIC 5 CC  Final   Culture   Final           BLOOD CULTURE RECEIVED NO GROWTH TO DATE CULTURE WILL BE HELD FOR 5 DAYS BEFORE ISSUING A FINAL NEGATIVE REPORT Performed at [facility]    Report Status PENDING  Incomplete  Culture, blood (routine x 2)     Status: None (Preliminary result)   Collection Time: 08/03/14  2:18 PM  Result Value Ref Range Status   Specimen Description BLOOD LEFT FOREARM  Final   Special Requests BOTTLES DRAWN AEROBIC AND ANAEROBIC 5 CC  Final   Culture   Final           BLOOD CULTURE RECEIVED NO GROWTH TO DATE CULTURE WILL BE HELD FOR 5 DAYS BEFORE ISSUING A FINAL NEGATIVE REPORT Performed at [facility]    Report Status PENDING  Incomplete  MRSA PCR Screening     Status: None   Collection Time: 08/03/14  7:32 PM  Result Value Ref Range Status   MRSA by PCR NEGATIVE NEGATIVE Final    Comment:        The GeneXpert MRSA Assay (FDA approved for  NASAL specimens only), is one component of a comprehensive MRSA colonization surveillance program. It is not intended to diagnose MRSA infection nor to guide or monitor treatment for MRSA infections.      Studies:  Recent x-ray studies have been reviewed in detail by the Attending Physician  Time spent :  35 mins  Junious Silk, ANP [facility] Office  [phone] Pager [phone]  On-Call/Text Page:      [email or web address]      password [email or web address]  If 7PM-7AM, please contact night-coverage [email or web address] Password [email or web address] 08/04/2014, 1:52 PM   LOS: 1 day   I have personally examined this patient and reviewed the entire database. I have reviewed the above note, made any necessary editorial changes, and agree with its content.  Lonia Blood, MD [facility]

## 2014-08-04 NOTE — Progress Notes (Addendum)
Pt transported per w/c with all belongings.  Accompanied by Nurse tech Given PO pain med for headache just prior to transfer

## 2014-08-04 NOTE — Progress Notes (Signed)
Utilization Review Completed.Dennis Harris T1/05/2015  

## 2014-08-04 NOTE — Progress Notes (Signed)
Called report to [facility] on [address] pt will be transferred to [facility] bed 28

## 2014-08-04 NOTE — Progress Notes (Signed)
INITIAL NUTRITION ASSESSMENT  DOCUMENTATION CODES Per approved criteria  -Severe malnutrition in the context of chronic illness   Pt meets criteria for severe MALNUTRITION in the context of chronic illness as evidenced by intake </= 75% of estimated energy requirement for >/= 1 month and 11% weight loss within 6 months.  INTERVENTION: Ensure Complete PO  TID, each supplement provides 350 kcal and 13 grams of protein  NUTRITION DIAGNOSIS: Malnutrition related to inadequate oral intake as evidenced by intake </= 75% of estimated energy requirement for >/= 1 month and 11% weight loss within 6 months.   Goal: Intake to meet >90% of estimated nutrition needs.  Monitor:  PO intake, labs, weight trend.  Reason for Assessment: Malnutrition Screening Tool  49 y.o. male  Admitting Dx: Severe sepsis  ASSESSMENT: Patient admitted on 1/10 with fever, chills, and progressive weakness for 3 days.  Patient reports 18 lb weight loss over the past 3-4 months. He has been eating very poorly at home, c/o a poor appetite. Patient shivering under covers because he is cold. Unable to complete nutrition focused physical exam at this time. Patient agreed to try Ensure supplements to maximize oral intake.  Height: Ht Readings from Last 1 Encounters:  08/03/14 5' 10.5" (1.791 m)    Weight: Wt Readings from Last 1 Encounters:  08/03/14 147 lb 7.8 oz (66.9 kg)    Ideal Body Weight: 76.8 kg  % Ideal Body Weight: 87%  Wt Readings from Last 10 Encounters:  08/03/14 147 lb 7.8 oz (66.9 kg)  11/07/12 167 lb (75.751 kg)    Usual Body Weight: 165 lb  % Usual Body Weight: 89%  BMI:  Body mass index is 20.86 kg/(m^2).  Estimated Nutritional Needs: Kcal: 2000-2200 Protein: 100-110 gm Fluid: 2-2.2 L  Skin: WDL  Diet Order: Diet regular  EDUCATION NEEDS: -Education needs addressed   Intake/Output Summary (Last 24 hours) at 08/04/14 1040 Last data filed at 08/04/14 1029  Gross per 24  hour  Intake 1762.92 ml  Output   2100 ml  Net -337.08 ml    Last BM: 1/10   Labs:   Recent Labs Lab 08/03/14 1114 08/04/14 0310  NA 132* 129*  K 3.6 3.7  CL 101 105  CO2 21 19  BUN 9 8  CREATININE 1.14 0.96  CALCIUM 8.7 7.9*  GLUCOSE 117* 119*    CBG (last 3)  No results for input(s): GLUCAP in the last 72 hours.  Scheduled Meds: . atorvastatin  10 mg Oral Daily  . azithromycin  500 mg Intravenous Q24H  . cefTRIAXone (ROCEPHIN)  IV  1 g Intravenous Q24H  . enoxaparin (LOVENOX) injection  40 mg Subcutaneous Q24H  . [START ON 08/05/2014] pneumococcal 23 valent vaccine  0.5 mL Intramuscular Tomorrow-1000  . predniSONE  20 mg Oral Q breakfast  . vancomycin  1,000 mg Intravenous Q12H    Continuous Infusions: . sodium chloride 125 mL/hr at 08/03/14 1713    Past Medical History  Diagnosis Date  . Hypercholesteremia   . Pericarditis ~ 2009  . Exertional shortness of breath     "at times" (11/14/2012)  . Rheumatoid arthritis(714.0)     Past Surgical History  Procedure Laterality Date  . Cardiac catheterization  May 2010    normal  . Tonsillectomy  1970's?  . Appendectomy  1970's  . Anterior cervical decomp/discectomy fusion  11/14/2012  . Anterior cervical decomp/discectomy fusion N/A 11/14/2012    Procedure: ANTERIOR CERVICAL DECOMPRESSION/DISCECTOMY FUSION 1 LEVEL C5-6;  Surgeon:  Venita Lick, MD;  Location: [facility] OR;  Service: Orthopedics;  Laterality: N/A;    Joaquin Courts, RD, LDN, CNSC Pager [phone] After Hours Pager [phone]

## 2014-08-05 DIAGNOSIS — I059 Rheumatic mitral valve disease, unspecified: Secondary | ICD-10-CM

## 2014-08-05 DIAGNOSIS — I5042 Chronic combined systolic (congestive) and diastolic (congestive) heart failure: Secondary | ICD-10-CM

## 2014-08-05 DIAGNOSIS — E43 Unspecified severe protein-calorie malnutrition: Secondary | ICD-10-CM

## 2014-08-05 DIAGNOSIS — M069 Rheumatoid arthritis, unspecified: Secondary | ICD-10-CM

## 2014-08-05 DIAGNOSIS — A481 Legionnaires' disease: Secondary | ICD-10-CM

## 2014-08-05 LAB — BASIC METABOLIC PANEL
Anion gap: 5 (ref 5–15)
BUN: 12 mg/dL (ref 6–23)
CO2: 23 mmol/L (ref 19–32)
Calcium: 8.3 mg/dL — ABNORMAL LOW (ref 8.4–10.5)
Chloride: 105 mEq/L (ref 96–112)
Creatinine, Ser: 0.91 mg/dL (ref 0.50–1.35)
GFR calc Af Amer: >90 mL/min (ref >90)
GLUCOSE: 108 mg/dL — AB (ref 70–99)
POTASSIUM: 4.5 mmol/L (ref 3.5–5.1)
Sodium: 133 mmol/L — ABNORMAL LOW (ref 135–145)

## 2014-08-05 LAB — CBC
HEMATOCRIT: 32.1 % — AB (ref 39.0–52.0)
HEMOGLOBIN: 10.4 g/dL — AB (ref 13.0–17.0)
MCH: 25.6 pg — AB (ref 26.0–34.0)
MCHC: 32.4 g/dL (ref 30.0–36.0)
MCV: 79.1 fL (ref 78.0–100.0)
PLATELETS: 243 10*3/uL (ref 150–400)
RBC: 4.06 MIL/uL — AB (ref 4.22–5.81)
RDW: 13.9 % (ref 11.5–15.5)
WBC: 11.3 10*3/uL — ABNORMAL HIGH (ref 4.0–10.5)

## 2014-08-05 LAB — GLUCOSE, CAPILLARY
GLUCOSE-CAPILLARY: 122 mg/dL — AB (ref 70–99)
Glucose-Capillary: 147 mg/dL — ABNORMAL HIGH (ref 70–99)
Glucose-Capillary: 125 mg/dL — ABNORMAL HIGH (ref 70–99)

## 2014-08-05 LAB — BRAIN NATRIURETIC PEPTIDE: B Natriuretic Peptide: 97.6 pg/mL (ref 0.0–100.0)

## 2014-08-05 MED ORDER — LEVOFLOXACIN IN D5W 750 MG/150ML IV SOLN
750.0000 mg | INTRAVENOUS | Status: DC
  Administered 2014-08-05 – 2014-08-07 (×3): 750 mg via INTRAVENOUS
  Filled 2014-08-05 (×3): qty 150

## 2014-08-05 MED ORDER — COLCHICINE 0.6 MG PO TABS
0.6000 mg | ORAL_TABLET | Freq: Two times a day (BID) | ORAL | Status: DC
  Administered 2014-08-05 – 2014-08-08 (×7): 0.6 mg via ORAL
  Filled 2014-08-05 (×8): qty 1

## 2014-08-05 NOTE — Progress Notes (Signed)
  Echocardiogram 2D Echocardiogram has been performed.  Cathie Beams 08/05/2014, 10:57 AM

## 2014-08-05 NOTE — Progress Notes (Signed)
CRITICAL VALUE ALERT  Critical value received: postive blood culture for legionella  Date of notification:  08/05/2014   Time of notification:  0950  Critical value read back:Yes.    Nurse who received alert:  Ok Anis, RN   MD notified (1st page): Virginia Rochester   Time of first page:  10:02 AM   MD notified (2nd page):  Time of second page:  Responding MD:  Virginia Rochester Time MD responded:  1017

## 2014-08-05 NOTE — Progress Notes (Addendum)
PROGRESS NOTE  Dennis Harris ZOX:[id number] DOB: 17-Jul-1966 DOA: 08/03/2014 PCP: Katy Apo, MD  HPI/Recap of past 76 hours: 49 year old male with past history of rheumatoid arthritis on methotrexate, chronic systolic/diastolic heart failure and previous pericarditis episode in 2009 admitted on 1/10 for complaints of fevers/chills and weakness 24 hours plus chest pain. Patient noted to be febrile at 104 with tachycardia, leukocytosis and mild elevation in troponin. Patient's chest x-ray noted right lower lobe consolidation consistent with pneumonia and patient started on antibiotics for community-acquired pneumonia. Since admission, patient seen by cardiology who felt chest pain related to pericarditis and patient started on steroids and NSAIDs. Initially placed in stepdown and then transferred to floor.  Today, patient still complaining of chest pain, although less. Breathing easier. Still having rigors and fevers. Urine antigen positive for Legionella. Echocardiogram done noting only trivial effusion. Cardiology discontinued steroids and added colchicine.   Assessment/Plan: Active Problems:   Legionella pneumonia: Discontinue Rocephin and Zithromax and started IV Levaquin which has been found to be effective as monotherapy    Rheumatoid arthritis: Continued on methotrexate. Reportedly patient has been on prednisone, but then reports he's not been taking it as of late and only for a short course. When I attempted to call his PCP, I found out that he has not seen his rheumatologist in almost 2 years and has not seen his PCP in a year and a half.    Chest pain: Felt to be secondary to pericarditis    Elevated troponin: Not ACS. Elevated lab secondary to acute pericarditis    Nausea vomiting and diarrhea: Likely secondary to dehydration and pain, resolved    Acute pericarditis: Seen by cardiology. Echocardiogram unrevealing for significant effusion. On colchicine plus NSAIDs.   Lipitor held while on colchicine due to interaction which can cause myopathy.    Acute hyponatremia: Secondary to dehydration. Improving with IV fluids.    Protein-calorie malnutrition, severe: Patient meets criteria for severe malnutrition in the context of chronic illness as evidenced by intake less than 75% of estimated energy requirement for greater than 1 month and 11% weight loss within 6 months. Seen by nutrition. Started on ensure by mouth 3 times a day    Chronic combined systolic and diastolic congestive heart failure: Monitoring daily weights. We'll check BNP although expect BNP to be mildly elevated in the setting of pericarditis.   Code Status: Full code  Family Communication: Wife at bedside  Disposition Plan: Home once pain significantly better and breathing more comfortably, possibly within the next 48 hours   Consultants:  Cardiology  Procedures:  Echocardiogram done 1/12: Trivial pericardial effusion, EF 40-45%, grade 1 diastolic dysfunction  Antibiotics:  IV Rocephin and Zithromax 1/10-1/12  IV Levaquin 750 mg 1/12-present   Objective: BP 148/75 mmHg  Pulse 110  Temp(Src) 100.2 F (37.9 C) (Oral)  Resp 20  Ht 5' 10.5" (1.791 m)  Wt 62.869 kg (138 lb 9.6 oz)  BMI 19.60 kg/m2  SpO2 100%  Intake/Output Summary (Last 24 hours) at 08/05/14 1612 Last data filed at 08/05/14 1400  Gross per 24 hour  Intake 558.33 ml  Output    350 ml  Net 208.33 ml   Filed Weights   08/03/14 1920 08/04/14 2100  Weight: 66.9 kg (147 lb 7.8 oz) 62.869 kg (138 lb 9.6 oz)    Exam:   General:  Alert and oriented 3, mild distress from pain and rigors  Cardiovascular: Regular rate and rhythm, S1-S2, borderline tachycardia, systolic ejection murmur  Respiratory: Decreased breath sounds bibasilar  Abdomen: Soft, nontender, nondistended, positive bowel sounds  Musculoskeletal: No clubbing or cyanosis, trace edema   Data Reviewed: Basic Metabolic Panel:  Recent  Labs Lab 08/03/14 1114 08/04/14 0310 08/05/14 0520  NA 132* 129* 133*  K 3.6 3.7 4.5  CL 101 105 105  CO2 21 19 23   GLUCOSE 117* 119* 108*  BUN 9 8 12   CREATININE 1.14 0.96 0.91  CALCIUM 8.7 7.9* 8.3*   Liver Function Tests:  Recent Labs Lab 08/03/14 1114 08/04/14 0310  AST 26 30  ALT 21 20  ALKPHOS 82 72  BILITOT 1.2 1.0  PROT 8.3 6.7  ALBUMIN 3.1* 2.7*   No results for input(s): LIPASE, AMYLASE in the last 168 hours. No results for input(s): AMMONIA in the last 168 hours. CBC:  Recent Labs Lab 08/03/14 1114 08/04/14 0310 08/05/14 0520  WBC 14.8* 13.9* 11.3*  NEUTROABS 12.9*  --   --   HGB 11.6* 10.4* 10.4*  HCT 36.2* 33.2* 32.1*  MCV 77.8* 78.1 79.1  PLT 258 220 243   Cardiac Enzymes:    Recent Labs Lab 08/03/14 1114 08/03/14 1414 08/03/14 2216  TROPONINI 0.04* 0.04* 0.04*   BNP (last 3 results) No results for input(s): PROBNP in the last 8760 hours. CBG:  Recent Labs Lab 08/04/14 2059 08/05/14 0739 08/05/14 1214  GLUCAP 145* 147* 122*    Recent Results (from the past 240 hour(s))  Urine culture     Status: None   Collection Time: 08/03/14  2:02 PM  Result Value Ref Range Status   Specimen Description URINE, CLEAN CATCH  Final   Special Requests Normal  Final   Colony Count NO GROWTH Performed at [facility]   Final   Culture NO GROWTH Performed at [facility]   Final   Report Status 08/04/2014 FINAL  Final  Culture, blood (routine x 2)     Status: None (Preliminary result)   Collection Time: 08/03/14  2:14 PM  Result Value Ref Range Status   Specimen Description BLOOD LEFT ARM  Final   Special Requests BOTTLES DRAWN AEROBIC AND ANAEROBIC 5 CC  Final   Culture   Final           BLOOD CULTURE RECEIVED NO GROWTH TO DATE CULTURE WILL BE HELD FOR 5 DAYS BEFORE ISSUING A FINAL NEGATIVE REPORT Performed at [facility]    Report Status PENDING  Incomplete  Culture, blood (routine x 2)     Status: None  (Preliminary result)   Collection Time: 08/03/14  2:18 PM  Result Value Ref Range Status   Specimen Description BLOOD LEFT FOREARM  Final   Special Requests BOTTLES DRAWN AEROBIC AND ANAEROBIC 5 CC  Final   Culture   Final           BLOOD CULTURE RECEIVED NO GROWTH TO DATE CULTURE WILL BE HELD FOR 5 DAYS BEFORE ISSUING A FINAL NEGATIVE REPORT Performed at [facility]    Report Status PENDING  Incomplete  MRSA PCR Screening     Status: None   Collection Time: 08/03/14  7:32 PM  Result Value Ref Range Status   MRSA by PCR NEGATIVE NEGATIVE Final    Comment:        The GeneXpert MRSA Assay (FDA approved for NASAL specimens only), is one component of a comprehensive MRSA colonization surveillance program. It is not intended to diagnose MRSA infection nor to guide or monitor treatment for MRSA infections.  Studies: No results found.  Scheduled Meds: . atorvastatin  10 mg Oral Daily  . colchicine  0.6 mg Oral BID  . feeding supplement (ENSURE COMPLETE)  237 mL Oral TID BM  . ibuprofen  800 mg Oral TID  . Influenza vac split quadrivalent PF  0.5 mL Intramuscular Tomorrow-1000  . levofloxacin (LEVAQUIN) IV  750 mg Intravenous Q24H  . pantoprazole  40 mg Oral Q1200  . pneumococcal 23 valent vaccine  0.5 mL Intramuscular Tomorrow-1000    Continuous Infusions: . sodium chloride 50 mL/hr at 08/04/14 2110     Time spent: 25 minutes  Dennis Harris  [facility] Pager [phone]. If 7PM-7AM, please contact night-coverage at [email or web address], password [facility] 08/05/2014, 4:12 PM  LOS: 2 days

## 2014-08-05 NOTE — Progress Notes (Signed)
    Subjective:  C/O headache  Objective:  Vital Signs in the last 24 hours: Temp:  [98.4 F (36.9 C)-100.2 F (37.9 C)] 100.2 F (37.9 C) (01/12 0548) Pulse Rate:  [90-110] 110 (01/12 1100) Resp:  [18-22] 20 (01/12 0548) BP: (113-148)/(71-76) 148/75 mmHg (01/12 0548) SpO2:  [94 %-100 %] 100 % (01/12 0548) Weight:  [138 lb 9.6 oz (62.869 kg)] 138 lb 9.6 oz (62.869 kg) (01/11 2100)  Intake/Output from previous day:  Intake/Output Summary (Last 24 hours) at 08/05/14 1240 Last data filed at 08/04/14 2110  Gross per 24 hour  Intake 965.33 ml  Output      0 ml  Net 965.33 ml    Physical Exam: General appearance: alert, cooperative and no distress Lungs: dereased Rt base Heart: regular rate and rhythm and no rub   Rate: 110  Rhythm: sinus tachycardia  Lab Results:  Recent Labs  08/04/14 0310 08/05/14 0520  WBC 13.9* 11.3*  HGB 10.4* 10.4*  PLT 220 243    Recent Labs  08/04/14 0310 08/05/14 0520  NA 129* 133*  K 3.7 4.5  CL 105 105  CO2 19 23  GLUCOSE 119* 108*  BUN 8 12  CREATININE 0.96 0.91    Recent Labs  08/03/14 1414 08/03/14 2216  TROPONINI 0.04* 0.04*   No results for input(s): INR in the last 72 hours.  Imaging: Imaging results have been reviewed  Cardiac Studies: Echo 08/05/14 Study Conclusions  - Left ventricle: The cavity size was normal. There was mild concentric hypertrophy. Systolic function was mildly to moderately reduced. The estimated ejection fraction was in the range of 40% to 45%. Diffuse hypokinesis. Doppler parameters are consistent with abnormal left ventricular relaxation (grade 1 diastolic dysfunction). There was no evidence of elevated ventricular filling pressure by Doppler parameters. - Aortic valve: There was no regurgitation. - Mitral valve: Structurally normal valve. There was mild regurgitation. - Right ventricle: Systolic function was normal. - Right atrium: The atrium was normal in size. -  Tricuspid valve: There was trivial regurgitation. - Pulmonary arteries: Systolic pressure was within the normal range. - Inferior vena cava: The vessel was normal in size. - Pericardium, extracardiac: A trivial pericardial effusion was identified posterior to the heart.  Impressions:  - Left ventricular systolic function is mildly to moderately decreased with duffuse hypokinesis. LV dysfunction might be affected by tachycardia during acquisition. Only trace pericardial effusion was seen.  Assessment/Plan:  49 y.o. male with a past medical history significant for rheumatoid arthritis and previous acute pericarditis (2009), no CAD by cath 2010 and normal nuclear stress test 2014 presents with fever and chills, myalgia, weakness. Found to have Rt CAP and suspected pericarditis.   Active Problems:   CAP (community acquired pneumonia)   Acute pericarditis   Rheumatoid arthritis   Chest pain   Elevated troponin   Nausea vomiting and diarrhea   Acute hyponatremia   Protein-calorie malnutrition, severe   PLAN: Will review with MD- he is still febrile, tachycardic. Echo shows "trivial" effusion. He is on steroids and Motrin (not sure he needs both). Discuss need for colchicine with MD.  Corine Shelter PA-C [facility] [phone] 08/05/2014, 12:40 PM   Personally seen and examined. Agree with above. Tachycardia likely compensatory with fever. Will add colchicine 0.6 BID Stop steroids.   Donato Schultz, MD

## 2014-08-06 ENCOUNTER — Inpatient Hospital Stay (HOSPITAL_COMMUNITY): Payer: Self-pay

## 2014-08-06 DIAGNOSIS — R1011 Right upper quadrant pain: Secondary | ICD-10-CM | POA: Diagnosis not present

## 2014-08-06 DIAGNOSIS — J90 Pleural effusion, not elsewhere classified: Secondary | ICD-10-CM

## 2014-08-06 DIAGNOSIS — R0789 Other chest pain: Secondary | ICD-10-CM

## 2014-08-06 DIAGNOSIS — F17210 Nicotine dependence, cigarettes, uncomplicated: Secondary | ICD-10-CM

## 2014-08-06 LAB — BASIC METABOLIC PANEL
Anion gap: 9 (ref 5–15)
BUN: 12 mg/dL (ref 6–23)
CHLORIDE: 101 meq/L (ref 96–112)
CO2: 22 mmol/L (ref 19–32)
Calcium: 8.6 mg/dL (ref 8.4–10.5)
Creatinine, Ser: 0.94 mg/dL (ref 0.50–1.35)
GFR calc non Af Amer: >90 mL/min (ref >90)
GLUCOSE: 109 mg/dL — AB (ref 70–99)
Potassium: 4.1 mmol/L (ref 3.5–5.1)
SODIUM: 132 mmol/L — AB (ref 135–145)

## 2014-08-06 LAB — CBC
HCT: 33.6 % — ABNORMAL LOW (ref 39.0–52.0)
HEMOGLOBIN: 11 g/dL — AB (ref 13.0–17.0)
MCH: 24.8 pg — ABNORMAL LOW (ref 26.0–34.0)
MCHC: 32.7 g/dL (ref 30.0–36.0)
MCV: 75.7 fL — ABNORMAL LOW (ref 78.0–100.0)
Platelets: 279 10*3/uL (ref 150–400)
RBC: 4.44 MIL/uL (ref 4.22–5.81)
RDW: 13.5 % (ref 11.5–15.5)
WBC: 15.9 10*3/uL — AB (ref 4.0–10.5)

## 2014-08-06 MED ORDER — ZOLPIDEM TARTRATE 5 MG PO TABS
5.0000 mg | ORAL_TABLET | Freq: Every evening | ORAL | Status: DC | PRN
  Administered 2014-08-06: 5 mg via ORAL
  Filled 2014-08-06: qty 1

## 2014-08-06 MED ORDER — RIFAMPIN 300 MG PO CAPS
300.0000 mg | ORAL_CAPSULE | Freq: Two times a day (BID) | ORAL | Status: DC
  Administered 2014-08-06 – 2014-08-07 (×3): 300 mg via ORAL
  Filled 2014-08-06 (×6): qty 1

## 2014-08-06 NOTE — Consult Note (Signed)
[facility]    Date of Admission:  08/03/2014  Date of Consult:  08/06/2014  Reason for Consult: Persistent fever despite appropriate therapy for Legionella pneumophila with macrolides and then levofloxacin Referring Physician: Dr. Tana Coast   HPI: Dennis Harris is an 49 y.o. male. Admitted with community-acquired pneumonia and found to have a Legionella antigen positive. He was narrowed appropriately to levofloxacin IV but has had persistent fevers despite adequate therapy. I was called today to assist in the workup and management of this patient who also apparently has a small pericardial effusion.  A CT scan of the chest was obtained to look for, getting factors such as empyema and CT shows apparent worsening of his RLL pneumonia.   Past Medical History  Diagnosis Date  . Hypercholesteremia   . Pericarditis ~ 2009  . Exertional shortness of breath     "at times" (11/14/2012)  . Rheumatoid arthritis(714.0)     Past Surgical History  Procedure Laterality Date  . Cardiac catheterization  May 2010    normal  . Tonsillectomy  1970's?  . Appendectomy  1970's  . Anterior cervical decomp/discectomy fusion  11/14/2012  . Anterior cervical decomp/discectomy fusion N/A 11/14/2012    Procedure: ANTERIOR CERVICAL DECOMPRESSION/DISCECTOMY FUSION 1 LEVEL C5-6;  Surgeon: Melina Schools, MD;  Location: [facility];  Service: Orthopedics;  Laterality: N/A;  ergies:   Allergies  Allergen Reactions  . Bee Venom Anaphylaxis  . Shellfish Allergy Anaphylaxis and Hives     Medications: I have reviewed patients current medications as documented in Epic Anti-infectives    Start     Dose/Rate Route Frequency Ordered Stop   08/05/14 1200  levofloxacin (LEVAQUIN) IVPB 750 mg     750 mg100 mL/hr over 90 Minutes Intravenous Every 24 hours 08/05/14 1028     08/04/14 0600  azithromycin (ZITHROMAX) 500 mg in dextrose 5 % 250 mL IVPB  Status:  Discontinued     500 mg250 mL/hr  over 60 Minutes Intravenous Every 24 hours 08/03/14 1629 08/05/14 1028   08/04/14 0600  cefTRIAXone (ROCEPHIN) 1 g in dextrose 5 % 50 mL IVPB  Status:  Discontinued     1 g100 mL/hr over 30 Minutes Intravenous Every 24 hours 08/03/14 1629 08/05/14 1028   08/04/14 0600  vancomycin (VANCOCIN) IVPB 1000 mg/200 mL premix  Status:  Discontinued     1,000 mg200 mL/hr over 60 Minutes Intravenous Every 12 hours 08/04/14 0242 08/04/14 1750   08/03/14 2200  oseltamivir (TAMIFLU) capsule 75 mg  Status:  Discontinued     75 mg Oral 2 times daily 08/03/14 1600 08/03/14 1600   08/03/14 1645  vancomycin (VANCOCIN) 1,250 mg in sodium chloride 0.9 % 250 mL IVPB     1,250 mg166.7 mL/hr over 90 Minutes Intravenous  Once 08/03/14 1642 08/03/14 1837   08/03/14 1630  oseltamivir (TAMIFLU) capsule 75 mg  Status:  Discontinued     75 mg Oral 2 times daily 08/03/14 1600 08/03/14 1849   08/03/14 1400  cefTRIAXone (ROCEPHIN) 1 g in dextrose 5 % 50 mL IVPB     1 g100 mL/hr over 30 Minutes Intravenous  Once 08/03/14 1357 08/03/14 1537   08/03/14 1400  azithromycin (ZITHROMAX) 500 mg in dextrose 5 % 250 mL IVPB     500 mg250 mL/hr over 60 Minutes Intravenous  Once 08/03/14 1357 08/03/14 1706      Social History:  reports that he has been smoking Cigarettes.  He has a 6.25  pack-year smoking history. He has never used smokeless tobacco. He reports that he drinks about 8.4 oz of alcohol per week. He reports that he does not use illicit drugs.  Family History  Problem Relation Age of Onset  . Cirrhosis Father   . Hypertension Mother   . Stroke Mother   . Diabetes Mother   . Lupus Sister   . Lupus Sister     As in HPI and primary teams notes otherwise 12 point review of systems is negative  Blood pressure 154/83, pulse 88, temperature 98.2 F (36.8 C), temperature source Oral, resp. rate 20, height 5' 10.5" (1.791 m), weight 138 lb 9.6 oz (62.869 kg), SpO2 100 %. General: Alert and awake, oriented x3, not in any  acute distress. HEENT: anicteric sclera, pupils reactive to light and accommodation, EOMI, oropharynx clear and without exudate CVS regular rate, normal r,  no murmur rubs or gallops Chest: Diminished breath sounds in the right few wheezes  Abdomen: soft nontender, nondistended, normal bowel sounds, Extremities: no  clubbing or edema noted bilaterally Skin: no rashes Neuro: nonfocal, strength and sensation intact   Results for orders placed or performed during the hospital encounter of 08/03/14 (from the past 48 hour(s))  Glucose, capillary     Status: Abnormal   Collection Time: 08/04/14  8:59 PM  Result Value Ref Range   Glucose-Capillary 145 (H) 70 - 99 mg/dL  CBC     Status: Abnormal   Collection Time: 08/05/14  5:20 AM  Result Value Ref Range   WBC 11.3 (H) 4.0 - 10.5 K/uL   RBC 4.06 (L) 4.22 - 5.81 MIL/uL   Hemoglobin 10.4 (L) 13.0 - 17.0 g/dL   HCT 32.1 (L) 39.0 - 52.0 %   MCV 79.1 78.0 - 100.0 fL   MCH 25.6 (L) 26.0 - 34.0 pg   MCHC 32.4 30.0 - 36.0 g/dL   RDW 13.9 11.5 - 15.5 %   Platelets 243 150 - 400 K/uL  Basic metabolic panel     Status: Abnormal   Collection Time: 08/05/14  5:20 AM  Result Value Ref Range   Sodium 133 (L) 135 - 145 mmol/L    Comment: Please note change in reference range.   Potassium 4.5 3.5 - 5.1 mmol/L    Comment: Please note change in reference range. DELTA CHECK NOTED    Chloride 105 96 - 112 mEq/L   CO2 23 19 - 32 mmol/L   Glucose, Bld 108 (H) 70 - 99 mg/dL   BUN 12 6 - 23 mg/dL   Creatinine, Ser 0.91 0.50 - 1.35 mg/dL   Calcium 8.3 (L) 8.4 - 10.5 mg/dL   GFR calc non Af Amer >90 >90 mL/min   GFR calc Af Amer >90 >90 mL/min    Comment: (NOTE) The eGFR has been calculated using the CKD EPI equation. This calculation has not been validated in all clinical situations. eGFR's persistently <90 mL/min signify possible Chronic Kidney Disease.    Anion gap 5 5 - 15  Glucose, capillary     Status: Abnormal   Collection Time: 08/05/14   7:39 AM  Result Value Ref Range   Glucose-Capillary 147 (H) 70 - 99 mg/dL  Glucose, capillary     Status: Abnormal   Collection Time: 08/05/14 12:14 PM  Result Value Ref Range   Glucose-Capillary 122 (H) 70 - 99 mg/dL  Brain natriuretic peptide     Status: None   Collection Time: 08/05/14  5:16 PM  Result Value  Ref Range   B Natriuretic Peptide 97.6 0.0 - 100.0 pg/mL    Comment: Please note change in reference range.  Glucose, capillary     Status: Abnormal   Collection Time: 08/05/14  5:22 PM  Result Value Ref Range   Glucose-Capillary 125 (H) 70 - 99 mg/dL  Basic metabolic panel     Status: Abnormal   Collection Time: 08/06/14  6:36 AM  Result Value Ref Range   Sodium 132 (L) 135 - 145 mmol/L    Comment: Please note change in reference range.   Potassium 4.1 3.5 - 5.1 mmol/L    Comment: Please note change in reference range.   Chloride 101 96 - 112 mEq/L   CO2 22 19 - 32 mmol/L   Glucose, Bld 109 (H) 70 - 99 mg/dL   BUN 12 6 - 23 mg/dL   Creatinine, Ser 0.94 0.50 - 1.35 mg/dL   Calcium 8.6 8.4 - 10.5 mg/dL   GFR calc non Af Amer >90 >90 mL/min   GFR calc Af Amer >90 >90 mL/min    Comment: (NOTE) The eGFR has been calculated using the CKD EPI equation. This calculation has not been validated in all clinical situations. eGFR's persistently <90 mL/min signify possible Chronic Kidney Disease.    Anion gap 9 5 - 15  CBC     Status: Abnormal   Collection Time: 08/06/14  6:36 AM  Result Value Ref Range   WBC 15.9 (H) 4.0 - 10.5 K/uL   RBC 4.44 4.22 - 5.81 MIL/uL   Hemoglobin 11.0 (L) 13.0 - 17.0 g/dL   HCT 33.6 (L) 39.0 - 52.0 %   MCV 75.7 (L) 78.0 - 100.0 fL   MCH 24.8 (L) 26.0 - 34.0 pg   MCHC 32.7 30.0 - 36.0 g/dL   RDW 13.5 11.5 - 15.5 %   Platelets 279 150 - 400 K/uL   @BRIEFLABTABLE (sdes,specrequest,cult,reptstatus)   ) Recent Results (from the past 720 hour(s))  Urine culture     Status: None   Collection Time: 08/03/14  2:02 PM  Result Value Ref Range  Status   Specimen Description URINE, CLEAN CATCH  Final   Special Requests Normal  Final   Colony Count NO GROWTH Performed at [facility]   Final   Culture NO GROWTH Performed at [facility]   Final   Report Status 08/04/2014 FINAL  Final  Culture, blood (routine x 2)     Status: None (Preliminary result)   Collection Time: 08/03/14  2:14 PM  Result Value Ref Range Status   Specimen Description BLOOD LEFT ARM  Final   Special Requests BOTTLES DRAWN AEROBIC AND ANAEROBIC 5 CC  Final   Culture   Final           BLOOD CULTURE RECEIVED NO GROWTH TO DATE CULTURE WILL BE HELD FOR 5 DAYS BEFORE ISSUING A FINAL NEGATIVE REPORT Performed at [facility]    Report Status PENDING  Incomplete  Culture, blood (routine x 2)     Status: None (Preliminary result)   Collection Time: 08/03/14  2:18 PM  Result Value Ref Range Status   Specimen Description BLOOD LEFT FOREARM  Final   Special Requests BOTTLES DRAWN AEROBIC AND ANAEROBIC 5 CC  Final   Culture   Final           BLOOD CULTURE RECEIVED NO GROWTH TO DATE CULTURE WILL BE HELD FOR 5 DAYS BEFORE ISSUING A FINAL NEGATIVE REPORT Performed at [facility]    Report Status PENDING  Incomplete  MRSA PCR Screening     Status: None   Collection Time: 08/03/14  7:32 PM  Result Value Ref Range Status   MRSA by PCR NEGATIVE NEGATIVE Final    Comment:        The GeneXpert MRSA Assay (FDA approved for NASAL specimens only), is one component of a comprehensive MRSA colonization surveillance program. It is not intended to diagnose MRSA infection nor to guide or monitor treatment for MRSA infections.      Impression/Recommendation  Active Problems:   Legionella pneumonia   Rheumatoid arthritis   Chest pain   Elevated troponin   Nausea vomiting and diarrhea   Acute pericarditis   Acute hyponatremia   Protein-calorie malnutrition, severe   Chronic combined systolic and diastolic congestive heart  failure   Dennis Harris is a 49 y.o. male with  Severe legionella PNA worse on CT scan despite appropriate therapy, also with an effusion  #1 Legionella CAP with effusion, severe:  --continue levaquin and add rifampin --would ask CCM to see if they think a bronchoscopy could aid in clearing this severe pneumonia I am VERY skeptical he has another infection in addition to legionella which ITSELF IS TYPICALLY SEVERE in nature  #2 Screening: check Hep C   08/06/2014, 6:48 PM   Thank you so much for this interesting consult  [facility] [phone] (pager) [phone] (office) 08/06/2014, 6:48 PM  Rhina Br[place] 08/06/2014, 6:48 PM

## 2014-08-06 NOTE — Progress Notes (Signed)
PROGRESS NOTE  Dennis Harris DGL:[id number] DOB: [birth year] DOA: 08/03/2014 PCP: Katy Apo, MD  Brief history of present illness 49 year old male with past history of rheumatoid arthritis on methotrexate, chronic systolic/diastolic heart failure and previous pericarditis episode in 2009 admitted on 1/10 for complaints of fevers/chills and weakness 24 hours plus chest pain. Patient noted to be febrile at 104 with tachycardia, leukocytosis and mild elevation in troponin. Patient's chest x-ray noted right lower lobe consolidation consistent with pneumonia and patient started on antibiotics for community-acquired pneumonia. Since admission, patient seen by cardiology who felt chest pain related to pericarditis and patient started on steroids and NSAIDs. Initially placed in stepdown and then transferred to floor. Patient continues to complain of right-sided chest pain, still having rigors, fevers of 102.4. A urine legionella antigen positive. Echocardiogram showed only trivial effusion.    Assessment/Plan:  Primary problem   Legionella pneumonia/ SIRS: - Patient meets SIRS criteria with tachycardia, fevers, tachypnea, leukocytosis, temp of 102.4 today   - IV Rocephin and Zithromax was discontinued on 1/13 and started on IV Levaquin 750 mg which has been found to be effective as monotherapy - Blood cultures negative so far, repeat blood cultures today, CT of the chest to rule out any empyema (continues to have right-sided chest pain) - HIV negative, flu PCR negative - ID consulted, discussed with Dr. Algis Liming    Rheumatoid arthritis:  - Continued on methotrexate. Reportedly patient has been on prednisone, but then reports he's not been taking it as of late and only for a short course.    Chest pain: Felt to be secondary to pericarditis per cardiology, started on colchicine    Elevated troponin:  - Not ACS. Elevated lab secondary to acute pericarditis per cardiology    Nausea  vomiting and diarrhea: Likely secondary to dehydration and pain, resolved    Acute pericarditis: Seen by cardiology. Echocardiogram unrevealing for significant effusion. On colchicine plus NSAIDs.  Lipitor held while on colchicine due to interaction which can cause myopathy.    Acute hyponatremia: Secondary to dehydration. Improving with IV fluids.    Protein-calorie malnutrition, severe:  Patient meets criteria for severe malnutrition in the context of chronic illness as evidenced by intake less than 75% of estimated energy requirement for greater than 1 month and 11% weight loss within 6 months. Seen by nutrition. Started on ensure 3 times a day    Chronic combined systolic and diastolic congestive heart failure:  Monitoring daily weights. Net positive balance of 1.6 L, dc fluids  Code Status: Full code  Family Communication:   Disposition Plan: Patient is alert and oriented 4, discussed with the patient  Consultants:  Cardiology  Procedures:  Echocardiogram done 1/12: Trivial pericardial effusion, EF 40-45%, grade 1 diastolic dysfunction  Antibiotics:  IV Rocephin and Zithromax 1/10-1/12  IV Levaquin 750 mg 1/12-present   Subjective  Patient seen and examined, still feeling miserable, having chills and rigors, temperature 102.4 this morning, right-sided chest pain  Objective: BP 159/83 mmHg  Pulse 117  Temp(Src) 102.4 F (39.1 C) (Oral)  Resp 22  Ht 5' 10.5" (1.791 m)  Wt 62.869 kg (138 lb 9.6 oz)  BMI 19.60 kg/m2  SpO2 100%  Intake/Output Summary (Last 24 hours) at 08/06/14 1120 Last data filed at 08/06/14 0900  Gross per 24 hour  Intake    240 ml  Output    350 ml  Net   -110 ml   Filed Weights   08/03/14 1920 08/04/14 2100 08/05/14 2126  Weight: 66.9 kg (147 lb 7.8 oz) 62.869 kg (138 lb 9.6 oz) 62.869 kg (138 lb 9.6 oz)    Exam:   General:  Alert and oriented 3   CVS: RRR, S1-S2, tachycardia murmur  Respiratory: Decreased breath sounds  bibasilar  Abdomen: Soft, nontender, nondistended, positive bowel sounds  Ext: No clubbing or cyanosis, trace edema   Data Reviewed: Basic Metabolic Panel:  Recent Labs Lab 08/03/14 1114 08/04/14 0310 08/05/14 0520 08/06/14 0636  NA 132* 129* 133* 132*  K 3.6 3.7 4.5 4.1  CL 101 105 105 101  CO2 21 19 23 22   GLUCOSE 117* 119* 108* 109*  BUN 9 8 12 12   CREATININE 1.14 0.96 0.91 0.94  CALCIUM 8.7 7.9* 8.3* 8.6   Liver Function Tests:  Recent Labs Lab 08/03/14 1114 08/04/14 0310  AST 26 30  ALT 21 20  ALKPHOS 82 72  BILITOT 1.2 1.0  PROT 8.3 6.7  ALBUMIN 3.1* 2.7*   No results for input(s): LIPASE, AMYLASE in the last 168 hours. No results for input(s): AMMONIA in the last 168 hours. CBC:  Recent Labs Lab 08/03/14 1114 08/04/14 0310 08/05/14 0520 08/06/14 0636  WBC 14.8* 13.9* 11.3* 15.9*  NEUTROABS 12.9*  --   --   --   HGB 11.6* 10.4* 10.4* 11.0*  HCT 36.2* 33.2* 32.1* 33.6*  MCV 77.8* 78.1 79.1 75.7*  PLT 258 220 243 279   Cardiac Enzymes:    Recent Labs Lab 08/03/14 1114 08/03/14 1414 08/03/14 2216  TROPONINI 0.04* 0.04* 0.04*   BNP (last 3 results) No results for input(s): PROBNP in the last 8760 hours. CBG:  Recent Labs Lab 08/04/14 2059 08/05/14 0739 08/05/14 1214 08/05/14 1722  GLUCAP 145* 147* 122* 125*    Recent Results (from the past 240 hour(s))  Urine culture     Status: None   Collection Time: 08/03/14  2:02 PM  Result Value Ref Range Status   Specimen Description URINE, CLEAN CATCH  Final   Special Requests Normal  Final   Colony Count NO GROWTH Performed at 10/04/14   Final   Culture NO GROWTH Performed at 10/02/14   Final   Report Status 08/04/2014 FINAL  Final  Culture, blood (routine x 2)     Status: None (Preliminary result)   Collection Time: 08/03/14  2:14 PM  Result Value Ref Range Status   Specimen Description BLOOD LEFT ARM  Final   Special Requests BOTTLES DRAWN AEROBIC AND  ANAEROBIC 5 CC  Final   Culture   Final           BLOOD CULTURE RECEIVED NO GROWTH TO DATE CULTURE WILL BE HELD FOR 5 DAYS BEFORE ISSUING A FINAL NEGATIVE REPORT Performed at 10/03/2014    Report Status PENDING  Incomplete  Culture, blood (routine x 2)     Status: None (Preliminary result)   Collection Time: 08/03/14  2:18 PM  Result Value Ref Range Status   Specimen Description BLOOD LEFT FOREARM  Final   Special Requests BOTTLES DRAWN AEROBIC AND ANAEROBIC 5 CC  Final   Culture   Final           BLOOD CULTURE RECEIVED NO GROWTH TO DATE CULTURE WILL BE HELD FOR 5 DAYS BEFORE ISSUING A FINAL NEGATIVE REPORT Performed at [facility]    Report Status PENDING  Incomplete  MRSA PCR Screening     Status: None   Collection Time: 08/03/14  7:32 PM  Result Value Ref Range Status   MRSA by PCR NEGATIVE NEGATIVE Final    Comment:        The GeneXpert MRSA Assay (FDA approved for NASAL specimens only), is one component of a comprehensive MRSA colonization surveillance program. It is not intended to diagnose MRSA infection nor to guide or monitor treatment for MRSA infections.      Studies: No results found.  Scheduled Meds: . colchicine  0.6 mg Oral BID  . feeding supplement (ENSURE COMPLETE)  237 mL Oral TID BM  . ibuprofen  800 mg Oral TID  . Influenza vac split quadrivalent PF  0.5 mL Intramuscular Tomorrow-1000  . levofloxacin (LEVAQUIN) IV  750 mg Intravenous Q24H  . pantoprazole  40 mg Oral Q1200  . pneumococcal 23 valent vaccine  0.5 mL Intramuscular Tomorrow-1000    Continuous Infusions: . sodium chloride 50 mL/hr at 08/04/14 2110    RAI,RIPUDEEP M.D. [facility] 08/06/2014, 11:26 AM  Pager: [phone]

## 2014-08-06 NOTE — Progress Notes (Signed)
Subjective: Febrile at times.  Continues to have right and left sided CP.    Objective: Vital signs in last 24 hours: Temp:  [97.8 F (36.6 C)-102.4 F (39.1 C)] 102.4 F (39.1 C) (01/13 0908) Pulse Rate:  [79-117] 117 (01/13 0908) Resp:  [20-22] 22 (01/13 0908) BP: (139-159)/(76-83) 159/83 mmHg (01/13 0908) SpO2:  [98 %-100 %] 100 % (01/13 0908) Weight:  [138 lb 9.6 oz (62.869 kg)] 138 lb 9.6 oz (62.869 kg) (01/12 2126) Last BM Date: 08/04/14  Intake/Output from previous day: 01/12 0701 - 01/13 0700 In: 180 [P.O.:180] Out: 350 [Urine:350] Intake/Output this shift: Total I/O In: 120 [P.O.:120] Out: -   Medications Current Facility-Administered Medications  Medication Dose Route Frequency Provider Last Rate Last Dose  . acetaminophen (TYLENOL) tablet 650 mg  650 mg Oral Q6H PRN Lonia Blood, MD   650 mg at 08/06/14 0956  . colchicine tablet 0.6 mg  0.6 mg Oral BID Donato Schultz, MD   0.6 mg at 08/06/14 0956  . feeding supplement (ENSURE COMPLETE) (ENSURE COMPLETE) liquid 237 mL  237 mL Oral TID BM Hettie Holstein, RD   237 mL at 08/05/14 2141  . ibuprofen (ADVIL,MOTRIN) tablet 800 mg  800 mg Oral TID Lonia Blood, MD   800 mg at 08/06/14 0956  . Influenza vac split quadrivalent PF (FLUARIX) injection 0.5 mL  0.5 mL Intramuscular Tomorrow-1000 Lonia Blood, MD      . levofloxacin (LEVAQUIN) IVPB 750 mg  750 mg Intravenous Q24H Hollice Espy, MD   750 mg at 08/06/14 1215  . methocarbamol (ROBAXIN) tablet 500 mg  500 mg Oral Q8H PRN Costin Otelia Sergeant, MD      . ondansetron [facility]) injection 4 mg  4 mg Intravenous Q6H PRN Leatha Gilding, MD   4 mg at 08/03/14 1708  . ondansetron (ZOFRAN) tablet 4 mg  4 mg Oral Q8H PRN Costin Otelia Sergeant, MD      . oxyCODONE-acetaminophen (PERCOCET/ROXICET) 5-325 MG per tablet 1 tablet  1 tablet Oral Q6H PRN Leatha Gilding, MD   1 tablet at 08/05/14 1437   And  . oxyCODONE (Oxy IR/ROXICODONE) immediate release tablet  5 mg  5 mg Oral Q6H PRN Leatha Gilding, MD   5 mg at 08/04/14 1355  . pantoprazole (PROTONIX) EC tablet 40 mg  40 mg Oral Q1200 Lonia Blood, MD   40 mg at 08/06/14 1216  . pneumococcal 23 valent vaccine (PNU-IMMUNE) injection 0.5 mL  0.5 mL Intramuscular Tomorrow-1000 Lonia Blood, MD        PE: General appearance: alert, cooperative and no distress Lungs: Clear on the left.  Right base with coarse crackles and E>A changes.  No wheezing Heart: regular rate and rhythm, S1, S2 normal, no murmur, click, rub or gallop Extremities: No LEE Pulses: 2+ and symmetric Skin: Warm and dry Neurologic: Grossly normal  Lab Results:   Recent Labs  08/04/14 0310 08/05/14 0520 08/06/14 0636  WBC 13.9* 11.3* 15.9*  HGB 10.4* 10.4* 11.0*  HCT 33.2* 32.1* 33.6*  PLT 220 243 279   BMET  Recent Labs  08/04/14 0310 08/05/14 0520 08/06/14 0636  NA 129* 133* 132*  K 3.7 4.5 4.1  CL 105 105 101  CO2 19 23 22   GLUCOSE 119* 108* 109*  BUN 8 12 12   CREATININE 0.96 0.91 0.94  CALCIUM 7.9* 8.3* 8.6    Assessment/Plan  49 y.o. male with a past medical history  significant for rheumatoid arthritis and previous acute pericarditis (2009), no CAD by cath 2010 and normal nuclear stress test 2014 presents with fever and chills, myalgia, weakness. Found to have Rt CAP and suspected pericarditis.   Active Problems:   Legionella pneumonia   Rheumatoid arthritis   Chest pain   Elevated troponin   Nausea vomiting and diarrhea   Acute pericarditis   Acute hyponatremia   Protein-calorie malnutrition, severe   Chronic combined systolic and diastolic congestive heart failure  Plan:   He was started on colchicine yesterday.  Continues to be febrile at times and tachycardic.  HR in the 80's currently. On Abx.  ID consulted.  EF 40-45% but was tachycardic during echo.  Previous EF normal.   He appears euvolemic.  Continue to monitor BP.      LOS: 3 days    HAGER, BRYAN PA-C 08/06/2014 12:34  PM  Personally seen and examined. Agree with above. Fevers persist Donato Schultz, MD

## 2014-08-07 DIAGNOSIS — R6510 Systemic inflammatory response syndrome (SIRS) of non-infectious origin without acute organ dysfunction: Secondary | ICD-10-CM | POA: Diagnosis present

## 2014-08-07 DIAGNOSIS — I309 Acute pericarditis, unspecified: Secondary | ICD-10-CM

## 2014-08-07 DIAGNOSIS — M7989 Other specified soft tissue disorders: Secondary | ICD-10-CM

## 2014-08-07 DIAGNOSIS — E871 Hypo-osmolality and hyponatremia: Secondary | ICD-10-CM

## 2014-08-07 DIAGNOSIS — I429 Cardiomyopathy, unspecified: Secondary | ICD-10-CM

## 2014-08-07 DIAGNOSIS — J189 Pneumonia, unspecified organism: Secondary | ICD-10-CM | POA: Insufficient documentation

## 2014-08-07 LAB — RAPID URINE DRUG SCREEN, HOSP PERFORMED
Amphetamines: NOT DETECTED
Barbiturates: NOT DETECTED
Benzodiazepines: NOT DETECTED
Cocaine: NOT DETECTED
OPIATES: POSITIVE — AB
TETRAHYDROCANNABINOL: NOT DETECTED

## 2014-08-07 LAB — BASIC METABOLIC PANEL
Anion gap: 17 — ABNORMAL HIGH (ref 5–15)
BUN: 12 mg/dL (ref 6–23)
CALCIUM: 9.1 mg/dL (ref 8.4–10.5)
CHLORIDE: 100 meq/L (ref 96–112)
CO2: 21 mmol/L (ref 19–32)
Creatinine, Ser: 0.96 mg/dL (ref 0.50–1.35)
GFR calc non Af Amer: >90 mL/min (ref >90)
GLUCOSE: 117 mg/dL — AB (ref 70–99)
POTASSIUM: 4 mmol/L (ref 3.5–5.1)
Sodium: 138 mmol/L (ref 135–145)

## 2014-08-07 LAB — CBC
HCT: 32.6 % — ABNORMAL LOW (ref 39.0–52.0)
Hemoglobin: 10.8 g/dL — ABNORMAL LOW (ref 13.0–17.0)
MCH: 25.3 pg — ABNORMAL LOW (ref 26.0–34.0)
MCHC: 33.1 g/dL (ref 30.0–36.0)
MCV: 76.3 fL — ABNORMAL LOW (ref 78.0–100.0)
Platelets: 314 10*3/uL (ref 150–400)
RBC: 4.27 MIL/uL (ref 4.22–5.81)
RDW: 13.5 % (ref 11.5–15.5)
WBC: 13.5 10*3/uL — ABNORMAL HIGH (ref 4.0–10.5)

## 2014-08-07 MED ORDER — LEVOFLOXACIN 750 MG PO TABS
750.0000 mg | ORAL_TABLET | Freq: Every day | ORAL | Status: DC
  Administered 2014-08-07: 750 mg via ORAL
  Filled 2014-08-07 (×2): qty 1

## 2014-08-07 NOTE — Consult Note (Signed)
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 His weakness increased and he spiked fevers to 104 and was taken to [facility] ED. CxR was remarkable for rt consolidation and he was admitted and placed on abx. Subsequent urine was + for legionella and abx were changed to Levaquin. Cardiology was consulted for chest pain and 2 d echo revealed trivial pericardial effusion. He has proven refractory to current treatment and continues to have fevers along with weakness. PCCM asked to evaluate.  PAST MEDICAL HISTORY :   has a past medical history of Hypercholesteremia; Pericarditis (~ 2009); Exertional shortness of breath; and Rheumatoid arthritis(714.0).  has past surgical history that includes Cardiac catheterization (May 2010); Tonsillectomy (1970's?); Appendectomy (1970's); Anterior cervical decomp/discectomy fusion (11/14/2012); and Anterior cervical decomp/discectomy fusion (N/A, 11/14/2012). Prior to Admission medications   Medication Sig Start Date End Date Taking? Authorizing Provider  Multiple Vitamin (MULTIVITAMIN) tablet Take 1 tablet by mouth daily.     Yes Historical Provider, MD  atorvastatin (LIPITOR) 10 MG tablet Take 10 mg by mouth daily.    Historical Provider, MD  docusate sodium (COLACE) 100 MG capsule Take 1 capsule (100 mg total) by mouth 3 (three) times daily as needed for constipation. 11/14/12   Venita Lick, MD  folic acid (FOLVITE) 1 MG tablet Take 1 mg by mouth daily.    Historical Provider, MD    methocarbamol (ROBAXIN) 500 MG tablet Take 1 tablet (500 mg total) by mouth 3 (three) times daily as needed. Patient not taking: Reported on 08/03/2014 11/14/12   Venita Lick, MD  ondansetron (ZOFRAN) 4 MG tablet Take 1 tablet (4 mg total) by mouth every 8 (eight) hours as needed for nausea. Patient not taking: Reported on 08/03/2014 11/14/12   Venita Lick, MD  oxyCODONE-acetaminophen (PERCOCET) 10-325 MG per tablet Take 1 tablet by mouth every 6 (six) hours as needed for pain. Patient not taking: Reported on 08/03/2014 06/25/14   Fayrene Helper, PA-C  polyethylene glycol powder (GLYCOLAX) powder Take 17 g by mouth daily. Patient not taking: Reported on 08/03/2014 11/14/12   Venita Lick, MD  predniSONE (DELTASONE) 20 MG tablet 2 tabs po daily x 4 days Patient not taking: Reported on 08/03/2014 06/25/14   Fayrene Helper, PA-C   Allergies  Allergen Reactions  . Bee Venom Anaphylaxis  . Shellfish Allergy Anaphylaxis and Hives    FAMILY HISTORY:  family history includes Cirrhosis in his father; Diabetes in his mother; Hypertension in his mother; Lupus in his sister and sister; Stroke in his mother. SOCIAL HISTORY:  reports that he has been smoking Cigarettes.  He has a 6.25 pack-year smoking history. He has never used smokeless tobacco. He reports that he drinks about 8.4 oz of alcohol per week. He reports that he does not use illicit drugs.  REVIEW OF SYSTEMS:   10 point review of system taken, please see HPI for positives and negatives.   SUBJECTIVE:   VITAL SIGNS: Temp:  [98.2 F (36.8 C)-99.8 F (37.7 C)] 99.8 F (37.7 C) (  01/14 1000) Pulse Rate:  [88-111] 111 (01/14 1000) Resp:  [19-24] 20 (01/14 1000) BP: (143-154)/(69-83) 153/72 mmHg (01/14 1000) SpO2:  [97 %-100 %] 98 % (01/14 1000)  PHYSICAL EXAMINATION: General:  Thin AAM nad at rest Neuro:  Intact HEENT: No LAN/JVD Cardiovascular:  HSR RRR Lungs:  Decreased bs rt base Abdomen:  +bs soft, nt Musculoskeletal:  Intact Skin:   Warm to hot   Recent Labs Lab 08/05/14 0520 08/06/14 0636 08/07/14 0630  NA 133* 132* 138  K 4.5 4.1 4.0  CL 105 101 100  CO2 23 22 21   BUN 12 12 12   CREATININE 0.91 0.94 0.96  GLUCOSE 108* 109* 117*    Recent Labs Lab 08/05/14 0520 08/06/14 0636 08/07/14 0630  HGB 10.4* 11.0* 10.8*  HCT 32.1* 33.6* 32.6*  WBC 11.3* 15.9* 13.5*  PLT 243 279 314   Ct Chest Wo Contrast  08/06/2014   CLINICAL DATA:  History of rheumatoid arthritis and chronic heart failure, previous pericarditis, acute right lower lobe pneumonia, evaluate for empyema  EXAM: CT CHEST WITHOUT CONTRAST  TECHNIQUE: Multidetector CT imaging of the chest was performed following the standard protocol without IV contrast.  COMPARISON:  01/04/2012, 08/03/2004  FINDINGS: Worsening extensive consolidation and airspace disease with central air bronchograms throughout the entire right lower lobe compatible with right lower lobe pneumonia. Interval development of a small non loculated right pleural effusion superiorly. Mild centrilobular emphysema pattern in the right upper lobe noted. Right upper and middle lobes appear spared. Left lung remains clear. Trachea and central airways remain patent.  Residual anterior medial triangular soft tissue compatible with thymic tissue. Difficult to exclude right hilar and sub carinal Mild adenopathy, suspect reactive.  No aneurysm evident. Normal heart size. No significant pericardial effusion. No hiatal hernia.  Included upper abdomen demonstrates hypodense renal cysts in the upper poles bilaterally.  No acute osseous finding.  IMPRESSION: Extensive worsening consolidative right lower lobe pneumonia with a small developing non loculated right pleural effusion superiorly.  Suspect right hilar and sub carinal reactive adenopathy  Mild upper lobe centrilobular emphysema pattern  Recommend plain radiographic followup to document resolution   Electronically Signed   By: 03/05/2012 M.D.   On:  08/06/2014 16:42   Active Problems:   Sepsis   Legionella pneumonia   Rheumatoid arthritis   Chest pain   Elevated troponin- not fel to be secondary to MI   Nausea vomiting and diarrhea   Acute pericarditis   Acute hyponatremia   Protein-calorie malnutrition, severe   Right upper quadrant pain   SIRS (systemic inflammatory response syndrome)   Cardiomyopathy- EF 40-45% presumed secondary to acute illness   ASSESSMENT / PLAN:  Discussion: Dennis Harris is a 49 yo life long 1/2 PPD smoker, rheumatoid arthritis sufferer (on  Prednisone) with hx of pericarditis (never required invasive intervention) Dennis Harris who developed fever, chills, weakness and rt side chest pain on 08/01/14. His weakness increased and he spiked fevers to 104 and was taken to [facility] ED. CxR was remarkable for rt consolidation and he was admitted and placed on abx. Subsequent urine was + for legionella and abx were changed to Levaquin. Cardiology was consulted for chest pain and 2 d echo revealed trivial pericardial effusion. He has proven refractory to current treatment and continues to have fevers along with weakness. PCCM asked to evaluate.   -Continue abx per ID -? Role for any invasive pulmonary interventions ie; FOB except for further micro workup continue steroids -pulmonary toilet -  follow up c x r for clearing rll pna -stop smoking -check uds  Pericarditis per cards Rheumatoid arthritis per [facility]  Dennis Harris ACNP Dennis Harris PCCM Pager [phone] till 3 pm If no answer page [phone] 08/07/2014, 12:42 PM  Patient seen and examined, no indication for bronchoscopy at this point, however, patient has been having wt loss and has been a smoker for >30 years.  Lung cancer is certainly a concern.  However, bronch now will yield atypical cells due to pneumonia.  Therefore, (and this was discussed with the patient and wife at length and importance of not missing it was made very clear) after treatment of  pneumonia, patient will need a repeat CXR, if cleared then a repeat CT and f/u with [facility] to insure that there is no underlying lesion.  But for now, recommend continuation of abx as dictated by ID.  Appointment made with PCCM.  PCCM will sign off, please call back if needed.  Patient seen and examined, agree with above note.  I dictated the care and orders written for this patient under my direction.  Dennis Reedy, MD [phone]

## 2014-08-07 NOTE — Progress Notes (Signed)
Reviewed with Dr Anne Fu. Main issue is Legionella PNA with associated pleurisy. Continue anti-inflammatory meds.  He will need follow up as an OP after he recovers and we will arrange this. We will be available for any cardiac issues.   Corine Shelter PA-C 08/07/2014 10:33 AM  Scheduled Meds: . colchicine  0.6 mg Oral BID  . feeding supplement (ENSURE COMPLETE)  237 mL Oral TID BM  . ibuprofen  800 mg Oral TID  . Influenza vac split quadrivalent PF  0.5 mL Intramuscular Tomorrow-1000  . levofloxacin (LEVAQUIN) IV  750 mg Intravenous Q24H  . pantoprazole  40 mg Oral Q1200  . pneumococcal 23 valent vaccine  0.5 mL Intramuscular Tomorrow-1000  . rifampin  300 mg Oral Q12H   Continuous Infusions:  PRN Meds:.acetaminophen, methocarbamol, ondansetron (ZOFRAN) IV, ondansetron, oxyCODONE-acetaminophen **AND** oxyCODONE, zolpidem  CP should improve with PNA tx.  Elevated trop because of pericarditis, pleurisy.   Will sign off.  Agree with above.  Donato Schultz, MD

## 2014-08-07 NOTE — Progress Notes (Signed)
[facility]    Subjective: No new complaints   Antibiotics:  Anti-infectives    Start     Dose/Rate Route Frequency Ordered Stop   08/07/14 1800  levofloxacin (LEVAQUIN) tablet 750 mg     750 mg Oral Daily-1800 08/07/14 1502     08/06/14 2200  rifampin (RIFADIN) capsule 300 mg     300 mg Oral Every 12 hours 08/06/14 1852     08/05/14 1200  levofloxacin (LEVAQUIN) IVPB 750 mg  Status:  Discontinued     750 mg100 mL/hr over 90 Minutes Intravenous Every 24 hours 08/05/14 1028 08/07/14 1502   08/04/14 0600  azithromycin (ZITHROMAX) 500 mg in dextrose 5 % 250 mL IVPB  Status:  Discontinued     500 mg250 mL/hr over 60 Minutes Intravenous Every 24 hours 08/03/14 1629 08/05/14 1028   08/04/14 0600  cefTRIAXone (ROCEPHIN) 1 g in dextrose 5 % 50 mL IVPB  Status:  Discontinued     1 g100 mL/hr over 30 Minutes Intravenous Every 24 hours 08/03/14 1629 08/05/14 1028   08/04/14 0600  vancomycin (VANCOCIN) IVPB 1000 mg/200 mL premix  Status:  Discontinued     1,000 mg200 mL/hr over 60 Minutes Intravenous Every 12 hours 08/04/14 0242 08/04/14 1750   08/03/14 2200  oseltamivir (TAMIFLU) capsule 75 mg  Status:  Discontinued     75 mg Oral 2 times daily 08/03/14 1600 08/03/14 1600   08/03/14 1645  vancomycin (VANCOCIN) 1,250 mg in sodium chloride 0.9 % 250 mL IVPB     1,250 mg166.7 mL/hr over 90 Minutes Intravenous  Once 08/03/14 1642 08/03/14 1837   08/03/14 1630  oseltamivir (TAMIFLU) capsule 75 mg  Status:  Discontinued     75 mg Oral 2 times daily 08/03/14 1600 08/03/14 1849   08/03/14 1400  cefTRIAXone (ROCEPHIN) 1 g in dextrose 5 % 50 mL IVPB     1 g100 mL/hr over 30 Minutes Intravenous  Once 08/03/14 1357 08/03/14 1537   08/03/14 1400  azithromycin (ZITHROMAX) 500 mg in dextrose 5 % 250 mL IVPB     500 mg250 mL/hr over 60 Minutes Intravenous  Once 08/03/14 1357 08/03/14 1706      Medications: Scheduled Meds: . colchicine  0.6 mg Oral BID  . feeding supplement  (ENSURE COMPLETE)  237 mL Oral TID BM  . ibuprofen  800 mg Oral TID  . Influenza vac split quadrivalent PF  0.5 mL Intramuscular Tomorrow-1000  . levofloxacin  750 mg Oral q1800  . pantoprazole  40 mg Oral Q1200  . pneumococcal 23 valent vaccine  0.5 mL Intramuscular Tomorrow-1000  . rifampin  300 mg Oral Q12H   Continuous Infusions:  PRN Meds:.acetaminophen, methocarbamol, ondansetron (ZOFRAN) IV, ondansetron, oxyCODONE-acetaminophen **AND** oxyCODONE, zolpidem    Objective: Weight change:   Intake/Output Summary (Last 24 hours) at 08/07/14 2123 Last data filed at 08/07/14 1700  Gross per 24 hour  Intake   1020 ml  Output    250 ml  Net    770 ml   Blood pressure 151/75, pulse 86, temperature 98.8 F (37.1 C), temperature source Oral, resp. rate 20, height 5' 10.5" (1.791 m), weight 138 lb 9.6 oz (62.869 kg), SpO2 98 %. Temp:  [98.6 F (37 C)-99.8 F (37.7 C)] 98.8 F (37.1 C) (01/14 1820) Pulse Rate:  [86-111] 86 (01/14 1820) Resp:  [19-24] 20 (01/14 1820) BP: (143-153)/(69-76) 151/75 mmHg (01/14 1820) SpO2:  [97 %-98 %] 98 % (01/14 1820)  Physical  Exam: General: Alert and awake, oriented x3, not in any acute distress. HEENT: anicteric sclera, pupils reactive to light and accommodation, EOMI, oropharynx clear and without exudate CVS regular rate, normal r, no murmur rubs or gallops Chest: Diminished breath sounds in the right few wheezes  Abdomen: soft nontender, nondistended, normal bowel sounds, Extremities: he has irriation and edeam in forearm at site of IV Neuro: nonfocal,  CBC:  CBC Latest Ref Rng 08/07/2014 08/06/2014 08/05/2014  WBC 4.0 - 10.5 K/uL 13.5(H) 15.9(H) 11.3(H)  Hemoglobin 13.0 - 17.0 g/dL 10.8(L) 11.0(L) 10.4(L)  Hematocrit 39.0 - 52.0 % 32.6(L) 33.6(L) 32.1(L)  Platelets 150 - 400 K/uL 314 279 243      BMET  Recent Labs  08/06/14 0636 08/07/14 0630  NA 132* 138  K 4.1 4.0  CL 101 100  CO2 22 21  GLUCOSE 109* 117*  BUN 12 12    CREATININE 0.94 0.96  CALCIUM 8.6 9.1     Liver Panel  No results for input(s): PROT, ALBUMIN, AST, ALT, ALKPHOS, BILITOT, BILIDIR, IBILI in the last 72 hours.     Sedimentation Rate No results for input(s): ESRSEDRATE in the last 72 hours. C-Reactive Protein No results for input(s): CRP in the last 72 hours.  Micro Results: Recent Results (from the past 720 hour(s))  Urine culture     Status: None   Collection Time: 08/03/14  2:02 PM  Result Value Ref Range Status   Specimen Description URINE, CLEAN CATCH  Final   Special Requests Normal  Final   Colony Count NO GROWTH Performed at [facility]   Final   Culture NO GROWTH Performed at [facility]   Final   Report Status 08/04/2014 FINAL  Final  Culture, blood (routine x 2)     Status: None (Preliminary result)   Collection Time: 08/03/14  2:14 PM  Result Value Ref Range Status   Specimen Description BLOOD LEFT ARM  Final   Special Requests BOTTLES DRAWN AEROBIC AND ANAEROBIC 5 CC  Final   Culture   Final           BLOOD CULTURE RECEIVED NO GROWTH TO DATE CULTURE WILL BE HELD FOR 5 DAYS BEFORE ISSUING A FINAL NEGATIVE REPORT Performed at [facility]    Report Status PENDING  Incomplete  Culture, blood (routine x 2)     Status: None (Preliminary result)   Collection Time: 08/03/14  2:18 PM  Result Value Ref Range Status   Specimen Description BLOOD LEFT FOREARM  Final   Special Requests BOTTLES DRAWN AEROBIC AND ANAEROBIC 5 CC  Final   Culture   Final           BLOOD CULTURE RECEIVED NO GROWTH TO DATE CULTURE WILL BE HELD FOR 5 DAYS BEFORE ISSUING A FINAL NEGATIVE REPORT Performed at [facility]    Report Status PENDING  Incomplete  MRSA PCR Screening     Status: None   Collection Time: 08/03/14  7:32 PM  Result Value Ref Range Status   MRSA by PCR NEGATIVE NEGATIVE Final    Comment:        The GeneXpert MRSA Assay (FDA approved for NASAL specimens only), is one component  of a comprehensive MRSA colonization surveillance program. It is not intended to diagnose MRSA infection nor to guide or monitor treatment for MRSA infections.   Culture, blood (routine x 2)     Status: None (Preliminary result)   Collection Time: 08/06/14 12:00 PM  Result Value Ref Range Status   Specimen Description BLOOD LEFT ANTECUBITAL  Final   Special Requests BOTTLES DRAWN AEROBIC AND ANAEROBIC 10CC  Final   Culture   Final           BLOOD CULTURE RECEIVED NO GROWTH TO DATE CULTURE WILL BE HELD FOR 5 DAYS BEFORE ISSUING A FINAL NEGATIVE REPORT Performed at [facility]    Report Status PENDING  Incomplete  Culture, blood (routine x 2)     Status: None (Preliminary result)   Collection Time: 08/06/14 12:15 PM  Result Value Ref Range Status   Specimen Description BLOOD LEFT FOREARM  Final   Special Requests BOTTLES DRAWN AEROBIC AND ANAEROBIC 10CC  Final   Culture   Final           BLOOD CULTURE RECEIVED NO GROWTH TO DATE CULTURE WILL BE HELD FOR 5 DAYS BEFORE ISSUING A FINAL NEGATIVE REPORT Performed at [facility]    Report Status PENDING  Incomplete    Studies/Results: Ct Chest Wo Contrast  08/06/2014   CLINICAL DATA:  History of rheumatoid arthritis and chronic heart failure, previous pericarditis, acute right lower lobe pneumonia, evaluate for empyema  EXAM: CT CHEST WITHOUT CONTRAST  TECHNIQUE: Multidetector CT imaging of the chest was performed following the standard protocol without IV contrast.  COMPARISON:  01/04/2012, 08/03/2004  FINDINGS: Worsening extensive consolidation and airspace disease with central air bronchograms throughout the entire right lower lobe compatible with right lower lobe pneumonia. Interval development of a small non loculated right pleural effusion superiorly. Mild centrilobular emphysema pattern in the right upper lobe noted. Right upper and middle lobes appear spared. Left lung remains clear. Trachea and central airways remain  patent.  Residual anterior medial triangular soft tissue compatible with thymic tissue. Difficult to exclude right hilar and sub carinal Mild adenopathy, suspect reactive.  No aneurysm evident. Normal heart size. No significant pericardial effusion. No hiatal hernia.  Included upper abdomen demonstrates hypodense renal cysts in the upper poles bilaterally.  No acute osseous finding.  IMPRESSION: Extensive worsening consolidative right lower lobe pneumonia with a small developing non loculated right pleural effusion superiorly.  Suspect right hilar and sub carinal reactive adenopathy  Mild upper lobe centrilobular emphysema pattern  Recommend plain radiographic followup to document resolution   Electronically Signed   By: Ruel Favors M.D.   On: 08/06/2014 16:42      Assessment/Plan:  Active Problems:   Sepsis   Legionella pneumonia   Rheumatoid arthritis   Chest pain   Elevated troponin- not fel to be secondary to MI   Nausea vomiting and diarrhea   Acute pericarditis   Acute hyponatremia   Protein-calorie malnutrition, severe   Right upper quadrant pain   SIRS (systemic inflammatory response syndrome)   Cardiomyopathy- EF 40-45% presumed secondary to acute illness    Dennis Harris is a 50 y.o. male with  Severe legionella PNA worse on CT scan despite appropriate therapy, also with an effusion  #1 Legionella CAP with effusion, severe:  --continue levaquin and and rifampin the former can be changed to po --continue pulmonary toilet --CCM does not see role at this point for bronchoscopy to aid with any potential plugging etc as there is not clear cut sign of this  I am skeptical of and co-infection playing role  #2 Swollen arm: likely superficial phlebitis  Will check dopplers, would use warm compresses   LOS: 4 days   Acey Lav 08/07/2014, 9:23 PM

## 2014-08-07 NOTE — Progress Notes (Signed)
PROGRESS NOTE  Dennis Harris RSW:[id number] DOB: [birth year] DOA: 08/03/2014 PCP: Katy Apo, MD  Brief history of present illness 49 year old male with past history of rheumatoid arthritis on methotrexate, chronic systolic/diastolic heart failure and previous pericarditis episode in 2009 admitted on 1/10 for complaints of fevers/chills and weakness 24 hours plus chest pain. Patient noted to be febrile at 104 with tachycardia, leukocytosis and mild elevation in troponin. Patient's chest x-ray noted right lower lobe consolidation consistent with pneumonia and patient started on antibiotics for community-acquired pneumonia. Since admission, patient seen by cardiology who felt chest pain related to pericarditis and patient started on steroids and NSAIDs. Initially placed in stepdown and then transferred to floor. Patient continues to complain of right-sided chest pain, still having rigors, fevers of 102.4. A urine legionella antigen positive. Echocardiogram showed only trivial effusion.    Assessment/Plan:  Primary problem   Legionella pneumonia/ SIRS: - Patient meets SIRS criteria with tachycardia, fevers, tachypnea, leukocytosis  - IV Rocephin and Zithromax was discontinued on 1/13 and started on IV Levaquin 750 mg which has been found to be effective as monotherapy - Blood cultures negative so far, CT of the chest showed extensive worsening consolidative right lower lobe pneumonia with small non-loculated right pleural effusion  - HIV negative, flu PCR negative, ID following - ID, Dr. Lattie Haw recommendations reviewed, discussed with pulmonology, Dr. Kelby Aline, who recommended continuing the current management with IV Levaquin and rifampin as recommended by ID, currently no bronchoscopy    Rheumatoid arthritis:  - Continued on methotrexate. Reportedly patient has been on prednisone, but then reports he's not been taking it as of late and only for a short course.    Chest pain: Felt to  be secondary to pericarditis per cardiology, started on colchicine    Elevated troponin:  - Not ACS. Elevated lab secondary to acute pericarditis per cardiology    Nausea vomiting and diarrhea: Likely secondary to dehydration and pain, resolved    Acute pericarditis: Seen by cardiology. Echocardiogram unrevealing for significant effusion. On colchicine plus NSAIDs.  Lipitor held while on colchicine due to interaction which can cause myopathy.    Acute hyponatremia: Secondary to dehydration. Improving with IV fluids.    Protein-calorie malnutrition, severe:  Patient meets criteria for severe malnutrition in the context of chronic illness as evidenced by intake less than 75% of estimated energy requirement for greater than 1 month and 11% weight loss within 6 months. Seen by nutrition. Started on ensure 3 times a day    Chronic combined systolic and diastolic congestive heart failure:  Monitoring daily weights. Net positive balance of 2.2 L, dc fluids  Code Status: Full code  Family Communication:   Disposition Plan: Patient is alert and oriented 4, discussed with the patient  Consultants:  Cardiology  Procedures:  Echocardiogram done 1/12: Trivial pericardial effusion, EF 40-45%, grade 1 diastolic dysfunction  Antibiotics:  IV Rocephin and Zithromax 1/10-1/12  IV Levaquin 750 mg 1/12-present  Rifampin 1/13   Subjective  Patient seen and examined, afebrile today, feeling somewhat better today still having right-sided chest pain   Objective: BP 153/72 mmHg  Pulse 111  Temp(Src) 99.8 F (37.7 C) (Oral)  Resp 20  Ht 5' 10.5" (1.791 m)  Wt 62.869 kg (138 lb 9.6 oz)  BMI 19.60 kg/m2  SpO2 98%  Intake/Output Summary (Last 24 hours) at 08/07/14 1138 Last data filed at 08/07/14 0900  Gross per 24 hour  Intake    660 ml  Output  0 ml  Net    660 ml   Filed Weights   08/03/14 1920 08/04/14 2100 08/05/14 2126  Weight: 66.9 kg (147 lb 7.8 oz) 62.869 kg (138 lb  9.6 oz) 62.869 kg (138 lb 9.6 oz)    Exam:   General:  Alert and oriented 3   CVS: RRR, S1-S2, tachycardia murmur  Respiratory: Decreased breath sounds bibasilar  Abdomen: Soft, nontender, nondistended, positive bowel sounds  Ext: No clubbing or cyanosis, trace edema   Data Reviewed: Basic Metabolic Panel:  Recent Labs Lab 08/03/14 1114 08/04/14 0310 08/05/14 0520 08/06/14 0636 08/07/14 0630  NA 132* 129* 133* 132* 138  K 3.6 3.7 4.5 4.1 4.0  CL 101 105 105 101 100  CO2 21 19 23 22 21   GLUCOSE 117* 119* 108* 109* 117*  BUN 9 8 12 12 12   CREATININE 1.14 0.96 0.91 0.94 0.96  CALCIUM 8.7 7.9* 8.3* 8.6 9.1   Liver Function Tests:  Recent Labs Lab 08/03/14 1114 08/04/14 0310  AST 26 30  ALT 21 20  ALKPHOS 82 72  BILITOT 1.2 1.0  PROT 8.3 6.7  ALBUMIN 3.1* 2.7*   No results for input(s): LIPASE, AMYLASE in the last 168 hours. No results for input(s): AMMONIA in the last 168 hours. CBC:  Recent Labs Lab 08/03/14 1114 08/04/14 0310 08/05/14 0520 08/06/14 0636 08/07/14 0630  WBC 14.8* 13.9* 11.3* 15.9* 13.5*  NEUTROABS 12.9*  --   --   --   --   HGB 11.6* 10.4* 10.4* 11.0* 10.8*  HCT 36.2* 33.2* 32.1* 33.6* 32.6*  MCV 77.8* 78.1 79.1 75.7* 76.3*  PLT 258 220 243 279 314   Cardiac Enzymes:    Recent Labs Lab 08/03/14 1114 08/03/14 1414 08/03/14 2216  TROPONINI 0.04* 0.04* 0.04*   BNP (last 3 results) No results for input(s): PROBNP in the last 8760 hours. CBG:  Recent Labs Lab 08/04/14 2059 08/05/14 0739 08/05/14 1214 08/05/14 1722  GLUCAP 145* 147* 122* 125*    Recent Results (from the past 240 hour(s))  Urine culture     Status: None   Collection Time: 08/03/14  2:02 PM  Result Value Ref Range Status   Specimen Description URINE, CLEAN CATCH  Final   Special Requests Normal  Final   Colony Count NO GROWTH Performed at [facility]   Final   Culture NO GROWTH Performed at [facility]   Final   Report Status  08/04/2014 FINAL  Final  Culture, blood (routine x 2)     Status: None (Preliminary result)   Collection Time: 08/03/14  2:14 PM  Result Value Ref Range Status   Specimen Description BLOOD LEFT ARM  Final   Special Requests BOTTLES DRAWN AEROBIC AND ANAEROBIC 5 CC  Final   Culture   Final           BLOOD CULTURE RECEIVED NO GROWTH TO DATE CULTURE WILL BE HELD FOR 5 DAYS BEFORE ISSUING A FINAL NEGATIVE REPORT Performed at [facility]    Report Status PENDING  Incomplete  Culture, blood (routine x 2)     Status: None (Preliminary result)   Collection Time: 08/03/14  2:18 PM  Result Value Ref Range Status   Specimen Description BLOOD LEFT FOREARM  Final   Special Requests BOTTLES DRAWN AEROBIC AND ANAEROBIC 5 CC  Final   Culture   Final           BLOOD CULTURE RECEIVED NO GROWTH TO DATE CULTURE WILL  BE HELD FOR 5 DAYS BEFORE ISSUING A FINAL NEGATIVE REPORT Performed at [facility]    Report Status PENDING  Incomplete  MRSA PCR Screening     Status: None   Collection Time: 08/03/14  7:32 PM  Result Value Ref Range Status   MRSA by PCR NEGATIVE NEGATIVE Final    Comment:        The GeneXpert MRSA Assay (FDA approved for NASAL specimens only), is one component of a comprehensive MRSA colonization surveillance program. It is not intended to diagnose MRSA infection nor to guide or monitor treatment for MRSA infections.   Culture, blood (routine x 2)     Status: None (Preliminary result)   Collection Time: 08/06/14 12:00 PM  Result Value Ref Range Status   Specimen Description BLOOD LEFT ANTECUBITAL  Final   Special Requests BOTTLES DRAWN AEROBIC AND ANAEROBIC 10CC  Final   Culture   Final           BLOOD CULTURE RECEIVED NO GROWTH TO DATE CULTURE WILL BE HELD FOR 5 DAYS BEFORE ISSUING A FINAL NEGATIVE REPORT Performed at [facility]    Report Status PENDING  Incomplete  Culture, blood (routine x 2)     Status: None (Preliminary result)   Collection  Time: 08/06/14 12:15 PM  Result Value Ref Range Status   Specimen Description BLOOD LEFT FOREARM  Final   Special Requests BOTTLES DRAWN AEROBIC AND ANAEROBIC 10CC  Final   Culture   Final           BLOOD CULTURE RECEIVED NO GROWTH TO DATE CULTURE WILL BE HELD FOR 5 DAYS BEFORE ISSUING A FINAL NEGATIVE REPORT Performed at [facility]    Report Status PENDING  Incomplete     Studies: Ct Chest Wo Contrast  08/06/2014   CLINICAL DATA:  History of rheumatoid arthritis and chronic heart failure, previous pericarditis, acute right lower lobe pneumonia, evaluate for empyema  EXAM: CT CHEST WITHOUT CONTRAST  TECHNIQUE: Multidetector CT imaging of the chest was performed following the standard protocol without IV contrast.  COMPARISON:  01/04/2012, 08/03/2004  FINDINGS: Worsening extensive consolidation and airspace disease with central air bronchograms throughout the entire right lower lobe compatible with right lower lobe pneumonia. Interval development of a small non loculated right pleural effusion superiorly. Mild centrilobular emphysema pattern in the right upper lobe noted. Right upper and middle lobes appear spared. Left lung remains clear. Trachea and central airways remain patent.  Residual anterior medial triangular soft tissue compatible with thymic tissue. Difficult to exclude right hilar and sub carinal Mild adenopathy, suspect reactive.  No aneurysm evident. Normal heart size. No significant pericardial effusion. No hiatal hernia.  Included upper abdomen demonstrates hypodense renal cysts in the upper poles bilaterally.  No acute osseous finding.  IMPRESSION: Extensive worsening consolidative right lower lobe pneumonia with a small developing non loculated right pleural effusion superiorly.  Suspect right hilar and sub carinal reactive adenopathy  Mild upper lobe centrilobular emphysema pattern  Recommend plain radiographic followup to document resolution   Electronically Signed   By:  Ruel Favors M.D.   On: 08/06/2014 16:42    Scheduled Meds: . colchicine  0.6 mg Oral BID  . feeding supplement (ENSURE COMPLETE)  237 mL Oral TID BM  . ibuprofen  800 mg Oral TID  . Influenza vac split quadrivalent PF  0.5 mL Intramuscular Tomorrow-1000  . levofloxacin (LEVAQUIN) IV  750 mg Intravenous Q24H  . pantoprazole  40 mg Oral  Q1200  . pneumococcal 23 valent vaccine  0.5 mL Intramuscular Tomorrow-1000  . rifampin  300 mg Oral Q12H    Continuous Infusions:    RAI,RIPUDEEP M.D. [facility] 08/07/2014, 11:38 AM  Pager: [phone]

## 2014-08-08 LAB — CBC
HCT: 28.7 % — ABNORMAL LOW (ref 39.0–52.0)
HEMOGLOBIN: 9.4 g/dL — AB (ref 13.0–17.0)
MCH: 24.2 pg — AB (ref 26.0–34.0)
MCHC: 32.8 g/dL (ref 30.0–36.0)
MCV: 73.8 fL — ABNORMAL LOW (ref 78.0–100.0)
Platelets: 395 10*3/uL (ref 150–400)
RBC: 3.89 MIL/uL — AB (ref 4.22–5.81)
RDW: 13.2 % (ref 11.5–15.5)
WBC: 9.5 10*3/uL (ref 4.0–10.5)

## 2014-08-08 MED ORDER — DOCUSATE SODIUM 100 MG PO CAPS: 100.0000 mg | ORAL_CAPSULE | Freq: Three times a day (TID) | ORAL | Status: DC | PRN

## 2014-08-08 MED ORDER — LEVOFLOXACIN 750 MG PO TABS: 750.0000 mg | ORAL_TABLET | Freq: Every day | ORAL | Status: DC

## 2014-08-08 MED ORDER — IBUPROFEN 800 MG PO TABS: 800.0000 mg | ORAL_TABLET | Freq: Three times a day (TID) | ORAL | Status: DC

## 2014-08-08 MED ORDER — METHOCARBAMOL 500 MG PO TABS: 500.0000 mg | ORAL_TABLET | Freq: Three times a day (TID) | ORAL | Status: DC | PRN

## 2014-08-08 MED ORDER — POLYETHYLENE GLYCOL 3350 17 GM/SCOOP PO POWD: 17.0000 g | Freq: Every day | ORAL | Status: DC | PRN

## 2014-08-08 MED ORDER — OXYCODONE-ACETAMINOPHEN 5-325 MG PO TABS: 1.0000 | ORAL_TABLET | Freq: Four times a day (QID) | ORAL | Status: DC | PRN

## 2014-08-08 MED ORDER — RIFAMPIN 300 MG PO CAPS
300.0000 mg | ORAL_CAPSULE | Freq: Two times a day (BID) | ORAL | Status: DC
  Administered 2014-08-08: 300 mg via ORAL
  Filled 2014-08-08 (×2): qty 1

## 2014-08-08 MED ORDER — RIFAMPIN 300 MG PO CAPS: 300.0000 mg | ORAL_CAPSULE | Freq: Two times a day (BID) | ORAL | Status: DC

## 2014-08-08 MED ORDER — LEVOFLOXACIN 750 MG PO TABS
750.0000 mg | ORAL_TABLET | Freq: Every day | ORAL | Status: DC
  Administered 2014-08-08: 750 mg via ORAL
  Filled 2014-08-08: qty 1

## 2014-08-08 MED ORDER — PANTOPRAZOLE SODIUM 40 MG PO TBEC: 40.0000 mg | DELAYED_RELEASE_TABLET | Freq: Every day | ORAL | Status: DC

## 2014-08-08 MED ORDER — COLCHICINE 0.6 MG PO TABS: 0.6000 mg | ORAL_TABLET | Freq: Two times a day (BID) | ORAL | Status: DC

## 2014-08-08 NOTE — Care Management Note (Signed)
CARE MANAGEMENT NOTE 08/08/2014  Patient:  Dennis Harris, Dennis Harris   Account Number:  [id number]  Date Initiated:  08/08/2014  Documentation initiated by:  Alvina Strother  Subjective/Objective Assessment:   CM following for progression and d/c planning     Action/Plan:   Met with pt who is between insurance providers at this time [facility] letter given to assist with meds, as pt will d/c on two antibiotics.   Anticipated DC Date:  08/08/2014   Anticipated DC Plan:  [facility]  Medication Assistance      Choice offered to / List presented to:             Status of service:  Completed, signed off Medicare Important Message given?  NO (If response is "NO", the following Medicare IM given date fields will be blank) Date Medicare IM given:   Medicare IM given by:   Date Additional Medicare IM given:   Additional Medicare IM given by:    Discharge Disposition:  HOME/SELF CARE  Per UR Regulation:    If discussed at Long Length of Stay Meetings, dates discussed:    Comments:

## 2014-08-08 NOTE — Discharge Summary (Signed)
Physician Discharge Summary  Patient ID: Dennis Harris MRN: [id number] DOB/AGE: Aug 09, 1965 49 y.o.  Admit date: 08/03/2014 Discharge date: 08/08/2014  Primary Care Physician:  Katy Apo, MD  Discharge Diagnoses:    . Legionella pneumonia . Severe sepsis/SIRS . Rheumatoid arthritis . Chest pain due to pericarditis, pleurisy  . Elevated troponin- not felt to be secondary to MI . Nausea vomiting and diarrhea . Acute pericarditis . Acute hyponatremia . Protein-calorie malnutrition, severe . SIRS (systemic inflammatory response syndrome)  Consults:  Cardiology, Dr. Anne Fu Infectious disease, Dr. Algis Liming Pulmonology, Dr. Molli Knock   Recommendations for Outpatient Follow-up:  Per infectious disease, recommended Levaquin and rifampin for another 5 days  Pulmonology appointment is scheduled on 09/04/14 at 10:30 AM, will need CT scan of the chest at that time  Patient is on colchicine for pericarditis per cardiology, hence Lipitor is placed on hold as the congruent use of colchicine and Lipitor can increase the risk of myopathy and rhabdomyolysis   TESTS THAT NEED FOLLOW-UP Likely CT scan of the chest   DIET: Heart healthy diet    Allergies:   Allergies  Allergen Reactions  . Bee Venom Anaphylaxis  . Shellfish Allergy Anaphylaxis and Hives     Discharge Medications:   Medication List    STOP taking these medications        atorvastatin 10 MG tablet  Commonly known as:  LIPITOR     folic acid 1 MG tablet  Commonly known as:  FOLVITE     ondansetron 4 MG tablet  Commonly known as:  ZOFRAN     oxyCODONE-acetaminophen 10-325 MG per tablet  Commonly known as:  PERCOCET  Replaced by:  oxyCODONE-acetaminophen 5-325 MG per tablet     predniSONE 20 MG tablet  Commonly known as:  DELTASONE      TAKE these medications        colchicine 0.6 MG tablet  Take 1 tablet (0.6 mg total) by mouth 2 (two) times daily.     docusate sodium 100 MG capsule  Commonly  known as:  COLACE  Take 1 capsule (100 mg total) by mouth 3 (three) times daily as needed for moderate constipation. Also available over the counter     ibuprofen 800 MG tablet  Commonly known as:  ADVIL,MOTRIN  Take 1 tablet (800 mg total) by mouth 3 (three) times daily. With food     levofloxacin 750 MG tablet  Commonly known as:  LEVAQUIN  Take 1 tablet (750 mg total) by mouth daily. X 5 more days  Start taking on:  08/09/2014     methocarbamol 500 MG tablet  Commonly known as:  ROBAXIN  Take 1 tablet (500 mg total) by mouth 3 (three) times daily as needed.     multivitamin tablet  Take 1 tablet by mouth daily.     oxyCODONE-acetaminophen 5-325 MG per tablet  Commonly known as:  PERCOCET/ROXICET  Take 1 tablet by mouth every 6 (six) hours as needed for moderate pain or severe pain.     pantoprazole 40 MG tablet  Commonly known as:  PROTONIX  Take 1 tablet (40 mg total) by mouth daily.     polyethylene glycol powder powder  Commonly known as:  GLYCOLAX  Take 17 g by mouth daily as needed for moderate constipation.     rifampin 300 MG capsule  Commonly known as:  RIFADIN  Take 1 capsule (300 mg total) by mouth 2 (two) times daily. X 5 more days  Brief H and P: For complete details please refer to admission H and P, but in brief patient is a 49 year old male with hyperlipidemia, history of pericarditis in 2009, intermittent chest pain with negative stress test in 2014, rheumatoid arthritis on methotrexate presented to ED with fever, chills, weakness and progressively worsening for last 2 days prior to admission. Patient reported his daughter had flulike symptoms, felt like myalgias, fatigue. He also reported GI upset with nausea, vomiting, diarrhea in the past 24 hours prior to admission. He was febrile 103-104, tachycardia, HR 120, lactic acid 0.78, mild troponin elevation 0.04, leukocytosis 14.8. Chest x-ray showed right lower lobe airspace consolidation patient was  admitted for further workup.  Hospital Course:  49 year old male with past history of rheumatoid arthritis on methotrexate, chronic systolic/diastolic heart failure and previous pericarditis episode in 2009 admitted on 1/10 for complaints of fevers/chills and weakness for at least 2 days prior to admission with chest pain. Patient noted to be febrile at 104 with tachycardia, leukocytosis and mild elevation in troponin. Patient's chest x-ray noted right lower lobe consolidation consistent with pneumonia and patient started on antibiotics for community-acquired pneumonia. Since admission, patient was seen by cardiology who felt chest pain related to pericarditis and patient started on steroids and NSAIDs. Initially placed in stepdown and then transferred to floor. Patient continues to complain of right-sided chest pain, still having rigors, fevers of 102.4. A urine legionella antigen positive. Echocardiogram showed only trivial effusion.   Legionella pneumonia/ SIRS: Patient meets SIRS criteria with tachycardia, fevers, tachypnea, leukocytosis. He was initially placed on IV Rocephin and Zithromax. Urine legionella antigen was positive. Patient was continuing to have fevers and riders, hence IV Rocephin and Zithromax were discontinued on 1/13. He was placed on IV Levaquin which has been found to be effective as monotherapy. Blood cultures remained negative so far. HIV negative, flu PCR negative. Given patient's continued fever, infectious disease consultation was obtained. Patient was seen by Dr. Algis Liming who added rifampin as well. CT of the chest was obtained which showed extensive worsening consolidation in the right lower lobe with small nonloculated right pleural effusion. Patient was also seen by pulmonology who did not recommend any bronchoscopy at this time, recommended to continue antibiotics . Outpatient pulmonology follow-up was scheduled, patient may need repeat CT chest to ensure complete resolution  of the pneumonia.   Rheumatoid arthritis:  - Continued on methotrexate. Reportedly patient has been on prednisone, but then reports he's not been taking it as of late and it was only for a short course.   Chest pain: Felt to be secondary to pericarditis per cardiology, started on colchicine   Elevated troponin:  - Not ACS. Elevated lab secondary to acute pericarditis per cardiology   Nausea vomiting and diarrhea: Likely secondary to dehydration and pain, resolved   Acute pericarditis: Seen by cardiology. Echocardiogram unrevealing for significant effusion. On colchicine plus NSAIDs. Lipitor held while on colchicine due to interaction which can cause myopathy.   Acute hyponatremia: Secondary to dehydration. Improving with IV fluids.   Protein-calorie malnutrition, severe: Patient meets criteria for severe malnutrition in the context of chronic illness as evidenced by intake less than 75% of estimated energy requirement for greater than 1 month and 11% weight loss within 6 months. Seen by nutrition. Started on ensure 3 times a day   Chronic combined systolic and diastolic congestive heart failure:  Currently stable and compensated.  Day of Discharge BP 130/87 mmHg  Pulse 105  Temp(Src) 98.2 F (36.8 C) (  Oral)  Resp 18  Ht 5' 10.5" (1.791 m)  Wt 64.3 kg (141 lb 12.1 oz)  BMI 20.05 kg/m2  SpO2 100%  Physical Exam: General: Alert and awake oriented x3 not in any acute distress. CVS: S1-S2 clear no murmur rubs or gallops Chest: clear to auscultation bilaterally, no wheezing rales or rhonchi Abdomen: soft nontender, nondistended, normal bowel sounds Extremities: no cyanosis, clubbing or edema noted bilaterally Neuro: Cranial nerves II-XII intact, no focal neurological deficits   The results of significant diagnostics from this hospitalization (including imaging, microbiology, ancillary and laboratory) are listed below for reference.    LAB RESULTS: Basic Metabolic  Panel:  Recent Labs Lab 08/06/14 0636 08/07/14 0630  NA 132* 138  K 4.1 4.0  CL 101 100  CO2 22 21  GLUCOSE 109* 117*  BUN 12 12  CREATININE 0.94 0.96  CALCIUM 8.6 9.1   Liver Function Tests:  Recent Labs Lab 08/03/14 1114 08/04/14 0310  AST 26 30  ALT 21 20  ALKPHOS 82 72  BILITOT 1.2 1.0  PROT 8.3 6.7  ALBUMIN 3.1* 2.7*   No results for input(s): LIPASE, AMYLASE in the last 168 hours. No results for input(s): AMMONIA in the last 168 hours. CBC:  Recent Labs Lab 08/03/14 1114  08/07/14 0630 08/08/14 0555  WBC 14.8*  < > 13.5* 9.5  NEUTROABS 12.9*  --   --   --   HGB 11.6*  < > 10.8* 9.4*  HCT 36.2*  < > 32.6* 28.7*  MCV 77.8*  < > 76.3* 73.8*  PLT 258  < > 314 395  < > = values in this interval not displayed. Cardiac Enzymes:  Recent Labs Lab 08/03/14 1414 08/03/14 2216  TROPONINI 0.04* 0.04*   BNP: Invalid input(s): POCBNP CBG:  Recent Labs Lab 08/05/14 1214 08/05/14 1722  GLUCAP 122* 125*    Significant Diagnostic Studies:  Ct Abdomen Pelvis W Contrast  08/03/2014   CLINICAL DATA:  Right abdominal pain, nausea/ vomiting, chills  EXAM: CT ABDOMEN AND PELVIS WITH CONTRAST  TECHNIQUE: Multidetector CT imaging of the abdomen and pelvis was performed using the standard protocol following bolus administration of intravenous contrast.  CONTRAST:  OMNIPAQUE IOHEXOL 300 MG/ML  SOLN  COMPARISON:  None.  FINDINGS: Lower chest:  Right lower lobe consolidation/pneumonia.  Hepatobiliary: Liver is within normal limits.  Gallbladder is unremarkable. No intrahepatic or extrahepatic ductal dilatation.  Pancreas: Within normal limits.  Spleen: Within normal limits.  Adrenals/Urinary Tract: Adrenal glands unremarkable.  Kidneys are notable for bilateral renal cysts, measuring up to 2.3 cm in the lateral interpolar left kidney (series 21/ image 27) and 2.6 cm in the lateral interpolar right kidney (series 201/ image 38). No hydronephrosis.  Mildly thick-walled  bladder.  Stomach/Bowel: Stomach is grossly unremarkable.  No evidence of bowel obstruction.  Prior appendectomy.  Vascular/Lymphatic: Atherosclerotic calcifications of the abdominal aorta and branch vessels.  No suspicious abdominopelvic lymphadenopathy.  Reproductive: Prostate is unremarkable.  Other: No abdominopelvic ascites.  Musculoskeletal: Mild degenerative changes of the visualized thoracolumbar spine.  IMPRESSION: No evidence of bowel obstruction.  Prior appendectomy.  Mildly thick-walled bladder, correlate for cystitis.  Right lower lobe pneumonia.   Electronically Signed   By: Charline Bills M.D.   On: 08/03/2014 15:20   Dg Chest Port 1 View  08/03/2014   CLINICAL DATA:  Chest pain and shortness of breath for 2 days. Emesis for 1 day  EXAM: PORTABLE CHEST - 1 VIEW  COMPARISON:  November 07, 2012  FINDINGS: There is consolidation in the right lower lobe. Lungs elsewhere clear. Heart size and pulmonary vascularity are normal. No adenopathy. No bone lesions.  IMPRESSION: Right lower lobe airspace consolidation.  Elsewhere lungs clear.   Electronically Signed   By: Bretta Bang M.D.   On: 08/03/2014 12:39    2D ECHO: Study Conclusions  - Left ventricle: The cavity size was normal. There was mild concentric hypertrophy. Systolic function was mildly to moderately reduced. The estimated ejection fraction was in the range of 40% to 45%. Diffuse hypokinesis. Doppler parameters are consistent with abnormal left ventricular relaxation (grade 1 diastolic dysfunction). There was no evidence of elevated ventricular filling pressure by Doppler parameters. - Aortic valve: There was no regurgitation. - Mitral valve: Structurally normal valve. There was mild regurgitation. - Right ventricle: Systolic function was normal. - Right atrium: The atrium was normal in size. - Tricuspid valve: There was trivial regurgitation. - Pulmonary arteries: Systolic pressure was within the  normal range. - Inferior vena cava: The vessel was normal in size. - Pericardium, extracardiac: A trivial pericardial effusion was identified posterior to the heart.  Impressions:  - Left ventricular systolic function is mildly to moderately decreased with duffuse hypokinesis. LV dysfunction might be affected by tachycardia during acquisition. Only trace pericardial effusion was seen  Disposition and Follow-up:     Discharge Instructions    Diet - low sodium heart healthy    Complete by:  As directed      Increase activity slowly    Complete by:  As directed             DISPOSITION: Home   DISCHARGE FOLLOW-UP Follow-up Information    Follow up with PARRETT,TAMMY, NP On 09/04/2014.   Specialty:  Nurse Practitioner   Why:  10:30 AM. [facility] and make f/u CT scan.   Contact information:   [address] [place] [address] [phone]       Follow up with Katy Apo, MD. Schedule an appointment as soon as possible for a visit in 10 days.   Specialty:  Internal Medicine   Why:  for hospital follow-up   Contact information:   [address] [place] [address] [phone]        Time spent on Discharge: 35 minutes  Signed:   Casmira Cramer M.D. [facility] 08/08/2014, 10:53 AM Pager: [phone]

## 2014-08-08 NOTE — Progress Notes (Signed)
Pt given discharge information including information on recommended activity levels, diet, follow up appointments, and medications .Pt verbalized understanding of all information. VSS. Pt escorted out by RN.   Carrie Mew, RN

## 2014-08-09 LAB — CULTURE, BLOOD (ROUTINE X 2)
Culture: NO GROWTH
Culture: NO GROWTH

## 2014-08-11 LAB — LEGIONELLA ANTIGEN, URINE

## 2014-08-11 LAB — HEPATITIS C ANTIBODY (REFLEX): HCV Ab: NEGATIVE

## 2014-08-12 LAB — CULTURE, BLOOD (ROUTINE X 2)
Culture: NO GROWTH
Culture: NO GROWTH

## 2014-09-04 ENCOUNTER — Inpatient Hospital Stay: Payer: Managed Care, Other (non HMO) | Admitting: Adult Health

## 2014-09-16 ENCOUNTER — Inpatient Hospital Stay: Payer: Managed Care, Other (non HMO) | Admitting: Adult Health

## 2015-06-28 ENCOUNTER — Encounter (HOSPITAL_COMMUNITY): Payer: Self-pay

## 2015-06-28 ENCOUNTER — Emergency Department (HOSPITAL_COMMUNITY)
Admission: EM | Admit: 2015-06-28 | Discharge: 2015-06-28 | Disposition: A | Discharge status: Home / Self Care | Payer: Managed Care, Other (non HMO) | Attending: Physician Assistant | Admitting: Physician Assistant

## 2015-06-28 DIAGNOSIS — Z9889 Other specified postprocedural states: Secondary | ICD-10-CM | POA: Insufficient documentation

## 2015-06-28 DIAGNOSIS — Z791 Long term (current) use of non-steroidal anti-inflammatories (NSAID): Secondary | ICD-10-CM | POA: Insufficient documentation

## 2015-06-28 DIAGNOSIS — Z8679 Personal history of other diseases of the circulatory system: Secondary | ICD-10-CM | POA: Insufficient documentation

## 2015-06-28 DIAGNOSIS — Z8639 Personal history of other endocrine, nutritional and metabolic disease: Secondary | ICD-10-CM | POA: Insufficient documentation

## 2015-06-28 DIAGNOSIS — M069 Rheumatoid arthritis, unspecified: Secondary | ICD-10-CM | POA: Insufficient documentation

## 2015-06-28 DIAGNOSIS — F17210 Nicotine dependence, cigarettes, uncomplicated: Secondary | ICD-10-CM | POA: Insufficient documentation

## 2015-06-28 DIAGNOSIS — M2550 Pain in unspecified joint: Secondary | ICD-10-CM

## 2015-06-28 DIAGNOSIS — Z79899 Other long term (current) drug therapy: Secondary | ICD-10-CM | POA: Insufficient documentation

## 2015-06-28 DIAGNOSIS — Z8739 Personal history of other diseases of the musculoskeletal system and connective tissue: Secondary | ICD-10-CM

## 2015-06-28 MED ORDER — NAPROXEN 500 MG PO TABS: 500.0000 mg | ORAL_TABLET | Freq: Two times a day (BID) | ORAL | Status: DC

## 2015-06-28 NOTE — Discharge Instructions (Signed)
Take your medications as prescribed as needed for pain relief.  Please follow up with a primary care provider from the Resource Guide provided below in 3-4 days. Please return to the Emergency Department if symptoms worsen or new onset of fever, swelling, numbness, tingling, weakness.   Emergency Department Resource Guide 1) Find a Doctor and Pay Out of Pocket Although you won't have to find out who is covered by your insurance plan, it is a good idea to ask around and get recommendations. You will then need to call the office and see if the doctor you have chosen will accept you as a new patient and what types of options they offer for patients who are self-pay. Some doctors offer discounts or will set up payment plans for their patients who do not have insurance, but you will need to ask so you aren't surprised when you get to your appointment.  2) Contact Your Local Health Department Not all health departments have doctors that can see patients for sick visits, but many do, so it is worth a call to see if yours does. If you don't know where your local health department is, you can check in your phone book. The CDC also has a tool to help you locate your state's health department, and many state websites also have listings of all of their local health departments.  3) Find a Walk-in Clinic If your illness is not likely to be very severe or complicated, you may want to try a walk in clinic. These are popping up all over the country in pharmacies, drugstores, and shopping centers. They're usually staffed by nurse practitioners or physician assistants that have been trained to treat common illnesses and complaints. They're usually fairly quick and inexpensive. However, if you have serious medical issues or chronic medical problems, these are probably not your best option.  No Primary Care Doctor: - Call Health Connect at  [phone] - they can help you locate a primary care doctor that  accepts your  insurance, provides certain services, etc. - Physician Referral Service- [phone]  Chronic Pain Problems: Organization         Address  Phone   Notes  [facility]  [phone] Patients need to be referred by their primary care doctor.   Medication Assistance: Organization         Address  Phone   Notes  [facility] Medication [facility] [address] [place] [place], [place] [address] [phone] --Must be a resident of [facility] -- Must have NO insurance coverage whatsoever (no Medicaid/ Medicare, etc.) -- The pt. MUST have a primary care doctor that directs their care regularly and follows them in the community   [facility]  [phone]   [employer]  [phone]    Agencies that provide inexpensive medical care: Organization         Address  Phone   Notes  [facility]  [phone]   [facility]    [phone]   [facility] [address] [place], [place] [address] [phone]   [facility] [address] [place] [place] [phone]   Planned Parenthood    [phone]   [facility]    [phone]   [facility] and [facility]  [address], [place] Phone:  [phone], Fax:  [phone] Hours of Operation:  9 am - 6 pm,  M-F.  Also accepts Medicaid/Medicare and self-pay.  [facility] for [place], [address], [place] Phone: [phone], Fax: [phone]. Hours of Operation:  8:30 am - 5:30 pm, M-F.  Also accepts Medicaid and self-pay.  [facility] [place] [address] [place] Phone: [phone]   [place], [place], [place] [phone], Ext. [phone] Mondays & Thursdays: 7-9 AM.  First 15 patients are seen on a first come, first serve basis.    [place] Providers:  Organization          Address  Phone   Notes  [facility] [address],  [phone] Also accepts self-pay patients.  [facility] [address], [place]  [phone]   [place], [address], [place] [phone]   [facility] [address] [place] [phone]   Lucianne Lei [address], [place]   [phone] Only accepts [place] Access [place] patients after they have their name applied to their card.   Self-Pay (no insurance) in [facility]:  Organization         Address  Phone   Notes  Sickle Cell Patients, [facility] Internal Medicine [place] [phone]   [facility] [place] [phone]   [facility] [place]  [place], [place], [place] [phone]   [facility]/Dr. Osei-Bonsu  [address], [place] or [address], [place] [phone] Phone number for both [place] and [place] locations is the same.  Urgent Medical and [facility] [address] [place] [phone]   [facility] [address] [place] or [address] [phone]   [facility] [address], [address] [phone], phone; [phone], fax Sees patients 1st and 3rd Saturday of every month.  Must not qualify for public or private insurance (i.e. Medicaid, Medicare, [place] Health Choice, Veterans' Benefits)  Household income should be no more than 200% of the poverty level The clinic cannot treat you if you are pregnant or think you are pregnant  Sexually transmitted diseases are not treated at the clinic.    Dental Care: Organization         Address  Phone  Notes  [facility] Department of [facility] [place] [phone] Accepts children up to age 58 who are enrolled in [place] or [place]; pregnant women with a Medicaid card; and children who have applied for Medicaid or [place] Health Choice, but were declined, whose parents can pay a reduced fee at time of service.  [facility] Department of [facility]  [address], [place] [phone] Accepts children up to age 76 who are enrolled in [place] or [place]; pregnant women with a Medicaid card; and children who have applied for Medicaid or  Health Choice, but were declined, whose parents can pay a reduced fee at time of service.  [place] Adult Dental Access PROGRAM  [place] [phone] Patients are seen by appointment only. Walk-ins are not accepted. [place] will see patients 22 years of age and older. Monday -  Tuesday (8am-5pm) Most Wednesdays (8:30-5pm) $30 per visit, cash only  [facility] Adult Dental Access PROGRAM  [address], [facility] [phone] Patients are seen by appointment only. Walk-ins are not accepted. [place] will see patients 74 years of age and older. One Wednesday Evening (Monthly: Volunteer Based).  $30 per visit, cash only  [place]  [phone] for adults; Children under age 63, call Graduate Pediatric Dentistry at [phone]. Children aged 64-14, please call [phone] to request a pediatric application.  Dental services are provided in all areas of dental care including fillings, crowns and bridges, complete and partial dentures, implants, gum treatment, root canals, and extractions. Preventive care is also provided. Treatment is provided to both adults and children. Patients are selected via a lottery and there is often a waiting list.   [facility] [address], [place]  [phone] [email or web address]   [facility] [address] [place], [place]  [phone], Ext. [phone] Second and Fourth Thursday of each month, opens at 6:30 AM; Clinic ends at 9 AM.  Patients are seen on a first-come first-served basis, and a limited number are seen during each clinic.   [facility]  [address] Hillard Danker [place], [place] [phone]   Eligibility Requirements You must have lived in [place], [place], or [place] counties for at least the last three months.   You cannot be eligible for state or federal sponsored [employer], including [employer], [place], or [employer].   You generally cannot be eligible for healthcare insurance through your employer.    How to apply: Eligibility screenings are held every Tuesday and Wednesday afternoon from 1:00 pm until 4:00 pm. You do not need an appointment for the interview!  [facility] [address] [place], [place]   [place]  [place] Department  [place]  [phone]    Behavioral Health Resources in the Community: Intensive Outpatient Programs Organization         Address  Phone  Notes  [place]. [address] [place], [place] [phone]   [facility] Outpatient [address] [place], [place]   ADS: Alcohol & Drug Svcs [address] [place], [place]   [place] [address]  [place], [place] or [phone]   Substance Abuse Resources Organization         Address  Phone  Notes  Alcohol and Drug Services  [phone]   [place]  [phone]   [facility] [place]   Chinita Pester  [phone]   Residential & Outpatient Substance Abuse Program  [phone]   Psychological Services Organization         Address  Phone  Notes  [facility] [place]  [place]  [phone]    [place] [address] [place] or [phone]    Mobile Crisis Teams Organization         Address  Phone  Notes  [facility], Mobile Crisis Care Unit  [phone]   [facility]  [address] [place], [place]   [place] [address], [place] [phone]    Self-Help/Support Groups Organization         Address  Phone  Notes  [facility] [place] - variety of support groups  [place] Call for more information  Narcotics Anonymous (NA), [facility]aring [facility] [address], [employer] [place]  2 meetings at this location   Special educational needs teacher         Address  Phone  Notes  ASAP Residential Treatment [place],    [place]  [phone]   [facility]  [address], [place] [phone], [place], [place]   [place], [place] [phone] Admissions: 8am-3pm M-F  Incentives Substance [place] [address]    [place], [place] [phone]   [facility] [address] [place], [place], [place]   [facility] [address]  [place], [place]   [facility] - Intensive Outpatient [address] Kristeen [address], [place], [place]   [facility] ([place].) [place].,  [place], [place] [phone] or [phone]   [facility] Services (RTS) [address], [place], [place] Accepts Medicaid  [facility] [place] [address]  [place] [phone] Substance Abuse/Addiction Treatment   [facility] Organization         Address  Phone  Notes  [facility]  [phone]   Domenic Schwab, PhD [address] Arlis Porta [place], [place]   [phone] or [phone]   [place]  [place], [place] [phone]   [facility] [address], [place], [place] [phone] Insurance/Medicaid/sponsorship through [facility] and Families [address]                                    [place], [place] [phone] [place] [address] [place], [place] [phone]    Dr. Adele Schilder  [phone]   [facility] [place] Dept. 1) [address] [place] 2) [place] 3)  [place] [address], [place] [phone]  [phone]   [place] [phone] or [phone] (After Hours)

## 2015-06-28 NOTE — ED Notes (Signed)
Per Pt, Pt has been having flare-ups with Rheumatoid Arthritis. Pt reports shoulders, feet, and hand pain due to the arthritis that has continued to get worse the last couple weeks. Pt denies fevers or fluid build up in the joints. Pt reports losing his job and being unable to see his Rheumatologist.

## 2015-06-28 NOTE — ED Provider Notes (Signed)
CSN: [id number]     Arrival date & time 06/28/15  0706 History   First MD Initiated Contact with Patient 06/28/15 [phone]     Chief Complaint  Patient presents with  . Rheumatoid Arthritis     (Consider location/radiation/quality/duration/timing/severity/associated sxs/prior Treatment) HPI   Pt is a 49 yo male with PMH of rheumtoid arthritis who presents to the ED with complaint of pain. Pt reports having worsening, constant aching pain to to right shoulder, right hand and bilateral feet. He reports his foot pain worsens with walking. Denies any recent fall, trauma or injury. Denies numbness, tingling, weakness, swelling. Mild relief with naproxen. Patient states he was previously on methotrexate but notes he he currently does not have health insurance due to losing his job and reports he has not been able to follow-up with his rheumatologist.  Past Medical History  Diagnosis Date  . Hypercholesteremia   . Pericarditis ~ 2009  . Exertional shortness of breath     "at times" (11/14/2012)  . Rheumatoid arthritis(714.0)    Past Surgical History  Procedure Laterality Date  . Cardiac catheterization  May 2010    normal  . Tonsillectomy  1970's?  . Appendectomy  1970's  . Anterior cervical decomp/discectomy fusion  11/14/2012  . Anterior cervical decomp/discectomy fusion N/A 11/14/2012    Procedure: ANTERIOR CERVICAL DECOMPRESSION/DISCECTOMY FUSION 1 LEVEL C5-6;  Surgeon: Venita Lick, MD;  Location: [facility];  Service: Orthopedics;  Laterality: N/A;   Family History  Problem Relation Age of Onset  . Cirrhosis Father   . Hypertension Mother   . Stroke Mother   . Diabetes Mother   . Lupus Sister   . Lupus Sister    Social History  Substance Use Topics  . Smoking status: Current Every Day Smoker -- 0.25 packs/day for 25 years    Types: Cigarettes  . Smokeless tobacco: Never Used     Comment: 11/14/2012 offered smoking cessation materials; pt declines  . Alcohol Use: 8.4 oz/week    14  Cans of beer per week     Comment: 11/14/2012 "2 beers/day"    Review of Systems  Musculoskeletal: Positive for arthralgias.  All other systems reviewed and are negative.     Allergies  Bee venom and Shellfish allergy  Home Medications   Prior to Admission medications   Medication Sig Start Date End Date Taking? Authorizing Provider  colchicine 0.6 MG tablet Take 1 tablet (0.6 mg total) by mouth 2 (two) times daily. 08/08/14   Ripudeep Jenna Luo, MD  docusate sodium (COLACE) 100 MG capsule Take 1 capsule (100 mg total) by mouth 3 (three) times daily as needed for moderate constipation. Also available over the counter 08/08/14   Ripudeep K Rai, MD  ibuprofen (ADVIL,MOTRIN) 800 MG tablet Take 1 tablet (800 mg total) by mouth 3 (three) times daily. With food 08/08/14   Ripudeep Jenna Luo, MD  levofloxacin (LEVAQUIN) 750 MG tablet Take 1 tablet (750 mg total) by mouth daily. X 5 more days 08/09/14   Ripudeep Jenna Luo, MD  methocarbamol (ROBAXIN) 500 MG tablet Take 1 tablet (500 mg total) by mouth 3 (three) times daily as needed. 08/08/14   Ripudeep Jenna Luo, MD  Multiple Vitamin (MULTIVITAMIN) tablet Take 1 tablet by mouth daily.      Historical Provider, MD  naproxen (NAPROSYN) 500 MG tablet Take 1 tablet (500 mg total) by mouth 2 (two) times daily. 06/28/15   Barrett Henle, PA-C  oxyCODONE-acetaminophen (PERCOCET/ROXICET) 5-325 MG per tablet Take  1 tablet by mouth every 6 (six) hours as needed for moderate pain or severe pain. 08/08/14   Ripudeep Jenna Luo, MD  pantoprazole (PROTONIX) 40 MG tablet Take 1 tablet (40 mg total) by mouth daily. 08/08/14   Ripudeep Jenna Luo, MD  polyethylene glycol powder (GLYCOLAX) powder Take 17 g by mouth daily as needed for moderate constipation. 08/08/14   Ripudeep Jenna Luo, MD  rifampin (RIFADIN) 300 MG capsule Take 1 capsule (300 mg total) by mouth 2 (two) times daily. X 5 more days 08/08/14   Ripudeep K Rai, MD   BP 152/108 mmHg  Pulse 78  Temp(Src) 97.7 F (36.5 C) (Oral)   Resp 18  Ht 5\' 10"  (1.778 m)  Wt 72.576 kg  BMI 22.96 kg/m2  SpO2 99% Physical Exam  Constitutional: He is oriented to person, place, and time. He appears well-developed and well-nourished. No distress.  HENT:  Head: Normocephalic and atraumatic.  Eyes: Conjunctivae and EOM are normal. Right eye exhibits no discharge. Left eye exhibits no discharge. No scleral icterus.  Neck: Normal range of motion. Neck supple.  Cardiovascular: Normal rate, regular rhythm, normal heart sounds and intact distal pulses.   Pulmonary/Chest: Effort normal and breath sounds normal. No respiratory distress. He has no wheezes. He has no rales. He exhibits no tenderness.  Abdominal: Soft. He exhibits no distension.  Musculoskeletal: Normal range of motion. He exhibits tenderness. He exhibits no edema.       Right shoulder: He exhibits normal range of motion, no tenderness, no bony tenderness, no swelling, no effusion, no crepitus, no deformity, no laceration, no spasm, normal pulse and normal strength.       Right hand: Normal. He exhibits normal range of motion, no tenderness, no bony tenderness, normal two-point discrimination, normal capillary refill, no deformity, no laceration and no swelling. Normal sensation noted. Normal strength noted.       Right foot: There is bony tenderness (pain at MTP joints). There is normal range of motion, no tenderness, no swelling, normal capillary refill, no crepitus, no deformity and no laceration.       Left foot: There is bony tenderness (pain at MTP joints). There is normal range of motion, no swelling, normal capillary refill, no crepitus, no deformity and no laceration.  Lymphadenopathy:    He has no cervical adenopathy.  Neurological: He is alert and oriented to person, place, and time.  Skin: Skin is warm and dry.  Nursing note and vitals reviewed.   ED Course  Procedures (including critical care time) Labs Review Labs Reviewed - No data to display  Imaging  Review No results found. I have personally reviewed and evaluated these images and lab results as part of my medical decision-making.  Filed Vitals:   06/28/15 0745 06/28/15 0815  BP: 163/96 152/108  Pulse: 85 78  Temp:    Resp:       MDM   Final diagnoses:  Joint pain  Hx of rheumatoid arthritis    Patient presents with worsening pain to right shoulder, right hand and bilateral feet. He notes he has had pain for the past few weeks and reports it is similar to pain he has had related to rheumatoid arthritis. Reports mild relief with naproxen at home. VSS. Exam revealed mild tenderness to bilateral MTP joints, bilateral upper and lower extremities neurovascularly intact. No signs of injury or trauma. I suspect pain is likely due to to chronic pain associated with patient's rheumatoid arthritis and do not feel that  any further workup or imaging is warranted at this time. Plan to discharge patient home with naproxen. Patient given resource guide to follow up with PCP.  Evaluation does not show pathology requring ongoing emergent intervention or admission. Pt is hemodynamically stable and mentating appropriately. Discussed findings/results and plan with patient/guardian, who agrees with plan. All questions answered. Return precautions discussed and outpatient follow up given.     Satira Sark [place], [place] 06/28/15 4097  Courteney Randall An, MD 06/28/15 [phone]

## 2015-12-23 ENCOUNTER — Emergency Department (HOSPITAL_COMMUNITY)
Admission: EM | Admit: 2015-12-23 | Discharge: 2015-12-23 | Disposition: A | Discharge status: Home / Self Care | Payer: Managed Care, Other (non HMO) | Attending: Emergency Medicine | Admitting: Emergency Medicine

## 2015-12-23 ENCOUNTER — Encounter (HOSPITAL_COMMUNITY): Payer: Self-pay

## 2015-12-23 DIAGNOSIS — Z791 Long term (current) use of non-steroidal anti-inflammatories (NSAID): Secondary | ICD-10-CM | POA: Insufficient documentation

## 2015-12-23 DIAGNOSIS — Z792 Long term (current) use of antibiotics: Secondary | ICD-10-CM | POA: Insufficient documentation

## 2015-12-23 DIAGNOSIS — H5712 Ocular pain, left eye: Secondary | ICD-10-CM | POA: Insufficient documentation

## 2015-12-23 DIAGNOSIS — F17210 Nicotine dependence, cigarettes, uncomplicated: Secondary | ICD-10-CM | POA: Insufficient documentation

## 2015-12-23 DIAGNOSIS — Z8739 Personal history of other diseases of the musculoskeletal system and connective tissue: Secondary | ICD-10-CM | POA: Insufficient documentation

## 2015-12-23 DIAGNOSIS — Z79899 Other long term (current) drug therapy: Secondary | ICD-10-CM | POA: Insufficient documentation

## 2015-12-23 DIAGNOSIS — Z8679 Personal history of other diseases of the circulatory system: Secondary | ICD-10-CM | POA: Insufficient documentation

## 2015-12-23 DIAGNOSIS — Z8639 Personal history of other endocrine, nutritional and metabolic disease: Secondary | ICD-10-CM | POA: Insufficient documentation

## 2015-12-23 DIAGNOSIS — Z9889 Other specified postprocedural states: Secondary | ICD-10-CM | POA: Insufficient documentation

## 2015-12-23 DIAGNOSIS — H53149 Visual discomfort, unspecified: Secondary | ICD-10-CM | POA: Insufficient documentation

## 2015-12-23 MED ORDER — FLUORESCEIN SODIUM 1 MG OP STRP
1.0000 | ORAL_STRIP | Freq: Once | OPHTHALMIC | Status: AC
  Administered 2015-12-23: 1 via OPHTHALMIC
  Filled 2015-12-23: qty 1

## 2015-12-23 MED ORDER — TETRACAINE HCL 0.5 % OP SOLN
2.0000 [drp] | Freq: Once | OPHTHALMIC | Status: AC
  Administered 2015-12-23: 2 [drp] via OPHTHALMIC
  Filled 2015-12-23: qty 2

## 2015-12-23 NOTE — ED Provider Notes (Signed)
CSN: [id number]     Arrival date & time 12/23/15  1028 History  By signing my name below, I, Renetta Chalk, attest that this documentation has been prepared under the direction and in the presence of Shawn Joy PA-C. Electronically Signed: Renetta Chalk, ED Scribe. 12/20/2015. 4:01 PM.   Chief Complaint  Patient presents with  . Eye Pain   The history is provided by the patient. No language interpreter was used.   HPI Comments: Dennis Harris is a 50 y.o. male who presents to the Emergency Department complaining of Left eye redness, swelling, and pain for the last 2 days. Patient rates his pain at 8 out of 10, sharp, nonradiating. Has tried ibuprofen without relief. Denies contact lens use. Denies visual disturbances, fever/chills, headache, eye drainage, or any other complaints.  Past Medical History  Diagnosis Date  . Hypercholesteremia   . Pericarditis ~ 2009  . Exertional shortness of breath     "at times" (11/14/2012)  . Rheumatoid arthritis(714.0)    Past Surgical History  Procedure Laterality Date  . Cardiac catheterization  May 2010    normal  . Tonsillectomy  1970's?  . Appendectomy  1970's  . Anterior cervical decomp/discectomy fusion  11/14/2012  . Anterior cervical decomp/discectomy fusion N/A 11/14/2012    Procedure: ANTERIOR CERVICAL DECOMPRESSION/DISCECTOMY FUSION 1 LEVEL C5-6;  Surgeon: Venita Lick, MD;  Location: [facility] OR;  Service: Orthopedics;  Laterality: N/A;   Family History  Problem Relation Age of Onset  . Cirrhosis Father   . Hypertension Mother   . Stroke Mother   . Diabetes Mother   . Lupus Sister   . Lupus Sister    Social History  Substance Use Topics  . Smoking status: Current Every Day Smoker -- 0.25 packs/day for 25 years    Types: Cigarettes  . Smokeless tobacco: Never Used     Comment: 11/14/2012 offered smoking cessation materials; pt declines  . Alcohol Use: 8.4 oz/week    14 Cans of beer per week     Comment: 11/14/2012 "2 beers/day"     Review of Systems  Constitutional: Negative for fever and chills.  Eyes: Positive for photophobia, pain and redness. Negative for discharge and visual disturbance.  Gastrointestinal: Negative for nausea and vomiting.  Neurological: Negative for headaches.      Allergies  Bee venom and Shellfish allergy  Home Medications   Prior to Admission medications   Medication Sig Start Date End Date Taking? Authorizing Provider  colchicine 0.6 MG tablet Take 1 tablet (0.6 mg total) by mouth 2 (two) times daily. 08/08/14   Ripudeep Jenna Luo, MD  docusate sodium (COLACE) 100 MG capsule Take 1 capsule (100 mg total) by mouth 3 (three) times daily as needed for moderate constipation. Also available over the counter 08/08/14   Ripudeep K Rai, MD  ibuprofen (ADVIL,MOTRIN) 800 MG tablet Take 1 tablet (800 mg total) by mouth 3 (three) times daily. With food 08/08/14   Ripudeep Jenna Luo, MD  levofloxacin (LEVAQUIN) 750 MG tablet Take 1 tablet (750 mg total) by mouth daily. X 5 more days 08/09/14   Ripudeep Jenna Luo, MD  methocarbamol (ROBAXIN) 500 MG tablet Take 1 tablet (500 mg total) by mouth 3 (three) times daily as needed. 08/08/14   Ripudeep Jenna Luo, MD  Multiple Vitamin (MULTIVITAMIN) tablet Take 1 tablet by mouth daily.      Historical Provider, MD  naproxen (NAPROSYN) 500 MG tablet Take 1 tablet (500 mg total) by mouth 2 (two) times  daily. 06/28/15   Barrett Henle, PA-C  oxyCODONE-acetaminophen (PERCOCET/ROXICET) 5-325 MG per tablet Take 1 tablet by mouth every 6 (six) hours as needed for moderate pain or severe pain. 08/08/14   Ripudeep Jenna Luo, MD  pantoprazole (PROTONIX) 40 MG tablet Take 1 tablet (40 mg total) by mouth daily. 08/08/14   Ripudeep Jenna Luo, MD  polyethylene glycol powder (GLYCOLAX) powder Take 17 g by mouth daily as needed for moderate constipation. 08/08/14   Ripudeep Jenna Luo, MD  rifampin (RIFADIN) 300 MG capsule Take 1 capsule (300 mg total) by mouth 2 (two) times daily. X 5 more days  08/08/14   Ripudeep K Rai, MD   BP 163/107 mmHg  Pulse 75  Temp(Src) 98.7 F (37.1 C) (Oral)  Resp 20  SpO2 99% Physical Exam  Constitutional: He appears well-developed and well-nourished. No distress.  HENT:  Head: Normocephalic and atraumatic.  Eyes: EOM are normal. Pupils are equal, round, and reactive to light. Right eye exhibits no discharge. Left eye exhibits no discharge. Right conjunctiva is not injected. Left conjunctiva is injected.  Conjunctival injection on the left, EOM intact, but painful, PERRL, Mild swelling to left superior lid with tenderness. Right eye normal  No contact lenses in place.  Woods Lamp exam shows increased uptake of fluorescein intermittently across the cornea. Slit lamp exam was also performed with no signs of corneal ulcer, iritis, anterior chamber damage, or globe damage.  Tono-Pen values: Right eye: 17  Left eye: 14    Visual Acuity  Right Eye Distance: 20/25 Left Eye Distance: 20/25 Bilateral Distance: 20/25  Right Eye Near:   Left Eye Near:    Bilateral Near:       Neck: Normal range of motion. Neck supple.  Cardiovascular: Normal rate and regular rhythm.   Pulmonary/Chest: Effort normal.  Lymphadenopathy:    He has no cervical adenopathy.  Neurological: He is alert.  Skin: Skin is warm and dry. He is not diaphoretic.  Psychiatric: He has a normal mood and affect. His behavior is normal.  Nursing note and vitals reviewed.   ED Course  Procedures  DIAGNOSTIC STUDIES: Oxygen Saturation is 99% on RA, normal by my interpretation.  COORDINATION OF CARE: 11:22 AM Will examine eye with slit lamp and check eye pressure. Discussed treatment plan with pt at bedside and pt agreed to plan.   MDM   Final diagnoses:  None   Dennis Harris presents with left eye pain, redness, and swelling for the last 2 days.  Abnormalities found on fluorescein and slit lamp exam. No pressure or visual acuity changes. Ophthalmology consult is  indicated based on abnormality of the exam. 11:49 AM Spoke with Dr. Alben Spittle, Ophthalmologist, who agreed to see the patient in his office today at 1:30 PM. This information was communicated with the patient, who agreed to the plan.  Filed Vitals:   12/23/15 1035 12/23/15 1157  BP: 159/86 163/107  Pulse: 85 75  Temp: 98.1 F (36.7 C) 98.7 F (37.1 C)  TempSrc: Oral Oral  Resp: 18 20  SpO2: 99% 99%     I personally performed the services described in this documentation, which was scribed in my presence. The recorded information has been reviewed and is accurate.   Anselm Pancoast, PA-C 12/23/15 1810  Pricilla Loveless, MD 12/24/15 [phone]

## 2015-12-23 NOTE — ED Notes (Signed)
Patient here with left eye redness and swelling with pain x 2 days. Denies drainage, denies injury. Blurred vision with same

## 2015-12-23 NOTE — Discharge Instructions (Signed)
You have been seen today for eye pain. An appointment has been set up for you at the ophthalmologist's (eye doctor) office today at 1:30 PM with Dr. Alben Spittle. Go to the address listed above. Follow up with PCP as needed. Return to ED should symptoms worsen.

## 2016-06-07 ENCOUNTER — Emergency Department (HOSPITAL_COMMUNITY)
Admission: EM | Admit: 2016-06-07 | Discharge: 2016-06-07 | Discharge status: Against Medical Advice | Payer: Managed Care, Other (non HMO)

## 2016-06-07 NOTE — ED Triage Notes (Addendum)
States that he had to go to childs school

## 2016-06-12 ENCOUNTER — Encounter (HOSPITAL_COMMUNITY): Payer: Self-pay | Admitting: Emergency Medicine

## 2016-06-12 ENCOUNTER — Emergency Department (HOSPITAL_COMMUNITY)
Admission: EM | Admit: 2016-06-12 | Discharge: 2016-06-12 | Disposition: A | Discharge status: Home / Self Care | Payer: Managed Care, Other (non HMO) | Attending: Emergency Medicine | Admitting: Emergency Medicine

## 2016-06-12 DIAGNOSIS — F17210 Nicotine dependence, cigarettes, uncomplicated: Secondary | ICD-10-CM | POA: Insufficient documentation

## 2016-06-12 DIAGNOSIS — R1030 Lower abdominal pain, unspecified: Secondary | ICD-10-CM | POA: Insufficient documentation

## 2016-06-12 DIAGNOSIS — R1031 Right lower quadrant pain: Secondary | ICD-10-CM

## 2016-06-12 DIAGNOSIS — I10 Essential (primary) hypertension: Secondary | ICD-10-CM | POA: Insufficient documentation

## 2016-06-12 HISTORY — DX: Essential (primary) hypertension: I10

## 2016-06-12 LAB — URINALYSIS, ROUTINE W REFLEX MICROSCOPIC
BILIRUBIN URINE: NEGATIVE
GLUCOSE, UA: NEGATIVE mg/dL
HGB URINE DIPSTICK: NEGATIVE
KETONES UR: NEGATIVE mg/dL
Leukocytes, UA: NEGATIVE
Nitrite: NEGATIVE
PH: 6 (ref 5.0–8.0)
Protein, ur: NEGATIVE mg/dL
Specific Gravity, Urine: 1.006 (ref 1.005–1.030)

## 2016-06-12 MED ORDER — METHOCARBAMOL 500 MG PO TABS: 500.0000 mg | ORAL_TABLET | Freq: Two times a day (BID) | ORAL | 0 refills | Status: DC

## 2016-06-12 NOTE — ED Triage Notes (Signed)
Pt. reports right groin pain for approx. 3 weeks , states " feels like a pull" , denies injury , no dysuria or fever .

## 2016-06-12 NOTE — ED Provider Notes (Signed)
Emergency Department Provider Note   I have reviewed the triage vital signs and the nursing notes.   HISTORY  Chief Complaint Groin Pain   HPI Dennis Harris is a 50 y.o. male with past medical history of hypertension, hypercholesterolemia, and rheumatoid arthritis presents to the emergency department for evaluation of right groin pain for the last 3-4 weeks. He describes it as almost constant and sharp. He notes radiation around to his buttock. No radiation of pain down the leg or into the scrotum. He has not appreciated any masses. No fever or shaking chills.No hematuria. The patient has a physically demanding job but does not recall a specific injury. Pain is made worse with movement. He is tried Aleve and Motrin at home with no relief.    Past Medical History:  Diagnosis Date  . Exertional shortness of breath    "at times" (11/14/2012)  . Hypercholesteremia   . Hypertension   . Pericarditis ~ 2009  . Rheumatoid arthritis(714.0)     Patient Active Problem List   Diagnosis Date Noted  . SIRS (systemic inflammatory response syndrome) (HCC) 08/07/2014  . Cardiomyopathy- EF 40-45% presumed secondary to acute illness 08/07/2014  . CAP (community acquired pneumonia)   . Right upper quadrant pain   . Acute pericarditis 08/04/2014  . Acute hyponatremia 08/04/2014  . Protein-calorie malnutrition, severe (HCC) 08/04/2014  . Sepsis (HCC) 08/03/2014  . Legionella pneumonia (HCC) 08/03/2014  . Rheumatoid arthritis (HCC) 08/03/2014  . Chest pain 08/03/2014  . Elevated troponin- not fel to be secondary to MI 08/03/2014  . Nausea vomiting and diarrhea 08/03/2014  . Cervical spondylosis without myelopathy 11/06/2012    Past Surgical History:  Procedure Laterality Date  . ANTERIOR CERVICAL DECOMP/DISCECTOMY FUSION  11/14/2012  . ANTERIOR CERVICAL DECOMP/DISCECTOMY FUSION N/A 11/14/2012   Procedure: ANTERIOR CERVICAL DECOMPRESSION/DISCECTOMY FUSION 1 LEVEL C5-6;  Surgeon:  Venita Lick, MD;  Location: [facility] OR;  Service: Orthopedics;  Laterality: N/A;  . APPENDECTOMY  1970's  . CARDIAC CATHETERIZATION  May 2010   normal  . TONSILLECTOMY  1970's?    Current Outpatient Rx  . Order #: [id number] Class: Historical Med  . Order #: [id number] Class: Print  . Order #: [id number] Class: Print  . Order #: [id number] Class: No Print  . Order #: [id number] Class: Print  . Order #: [id number] Class: Print  . Order #: [id number] Class: Print  . Order #: [id number] Class: Print  . Order #: [id number] Class: Print  . Order #: [id number] Class: Print  . Order #: [id number] Class: Print    Allergies Bee venom and Shellfish allergy  Family History  Problem Relation Age of Onset  . Cirrhosis Father   . Lupus Sister   . Lupus Sister   . Hypertension Mother   . Stroke Mother   . Diabetes Mother     Social History Social History  Substance Use Topics  . Smoking status: Current Every Day Smoker    Years: 0.00    Types: Cigarettes  . Smokeless tobacco: Never Used  . Alcohol use Yes    Review of Systems  Constitutional: No fever/chills Eyes: No visual changes. ENT: No sore throat. Cardiovascular: Denies chest pain. Respiratory: Denies shortness of breath. Gastrointestinal: No abdominal pain.  No nausea, no vomiting.  No diarrhea.  No constipation. Genitourinary: Negative for dysuria. Musculoskeletal: Negative for back pain. Right sided groin pain.  Skin: Negative for rash. Neurological: Negative for headaches, focal weakness or numbness.  10-point ROS otherwise negative.  ____________________________________________   PHYSICAL EXAM:  VITAL SIGNS: ED Triage  Vitals  Enc Vitals Group     BP 06/12/16 0656 166/96     Pulse Rate 06/12/16 0656 82     Resp 06/12/16 0656 16     Temp 06/12/16 0656 97.8 F (36.6 C)     Temp Source 06/12/16 0656 Oral     SpO2 06/12/16 0656 100 %     Weight 06/12/16 0657 149 lb (67.6 kg)     Height 06/12/16 0657 5' 10.5" (1.791 m)      Pain Score 06/12/16 0658 8    Constitutional: Alert and oriented. Well appearing and in no acute distress. Eyes: Conjunctivae are normal.  Head: Atraumatic. Nose: No congestion/rhinnorhea. Mouth/Throat: Mucous membranes are moist.  Neck: No stridor.   Cardiovascular: Normal rate, regular rhythm. Good peripheral circulation. Grossly normal heart sounds.   Respiratory: Normal respiratory effort.  No retractions. Lungs CTAB. Gastrointestinal: Soft and nontender. No distention. No inguinal masses. Musculoskeletal: No lower extremity tenderness nor edema. No gross deformities of extremities. Neurologic:  Normal speech and language. No gross focal neurologic deficits are appreciated.  Skin:  Skin is warm, dry and intact. No rash noted.   ____________________________________________   LABS (all labs ordered are listed, but only abnormal results are displayed)  Labs Reviewed  URINALYSIS, ROUTINE W REFLEX MICROSCOPIC (NOT AT [facility])   ____________________________________________   PROCEDURES  Procedure(s) performed:   Procedures  None ____________________________________________   INITIAL IMPRESSION / ASSESSMENT AND PLAN / ED COURSE  Pertinent labs & imaging results that were available during my care of the patient were reviewed by me and considered in my medical decision making (see chart for details).  Patient resents to the emergency department for evaluation of right groin pain. No evidence of hernia. Testicular exam is normal. No evidence to suggest torsion. Patient has no midline back pain or symptoms to suggest spinal emergency. Plan for urinalysis to evaluate for possible infectious etiology or hematuria that may suggest stone although this is less likely. Most consistent with musculoskeletal etiology for the patient's symptoms. Plan for symptomatic minute home and primary care physician follow-up.  09:28 AM UA is negative. Suspect MSK etiology. We'll prescribe very small  amount of muscle relaxer for use at night to help with sleep. His cost return precautions in detail.  At this time, I do not feel there is any life-threatening condition present. I have reviewed and discussed all results (EKG, imaging, lab, urine as appropriate), exam findings with patient. I have reviewed nursing notes and appropriate previous records.  I feel the patient is safe to be discharged home without further emergent workup. Discussed usual and customary return precautions. Patient and family (if present) verbalize understanding and are comfortable with this plan.  Patient will follow-up with their primary care provider. If they do not have a primary care provider, information for follow-up has been provided to them. All questions have been answered.  ____________________________________________  FINAL CLINICAL IMPRESSION(S) / ED DIAGNOSES  Final diagnoses:  Right groin pain     MEDICATIONS GIVEN DURING THIS VISIT:  None  NEW OUTPATIENT MEDICATIONS STARTED DURING THIS VISIT:  Robaxin   Note:  This document was prepared using Dragon voice recognition software and may include unintentional dictation errors.  Alona Bene, MD Emergency Medicine   Maia Plan, MD 06/13/16 1019

## 2016-06-12 NOTE — Discharge Instructions (Signed)

## 2016-06-12 NOTE — ED Notes (Signed)
Pt complaining of right groin pain that radiates to right back and hip area.  It has been going on for approx 3 to 4 weeks.  States it feels like a muscle has been pulled but pain is constant and will not go away.

## 2016-06-12 NOTE — ED Notes (Signed)
Pt has hx of hypertension. Pt states that he takes meds for his high BP but did not take med this morning.

## 2016-06-26 ENCOUNTER — Emergency Department (HOSPITAL_COMMUNITY): Payer: Self-pay

## 2016-06-26 ENCOUNTER — Emergency Department (HOSPITAL_COMMUNITY)
Admission: EM | Admit: 2016-06-26 | Discharge: 2016-06-26 | Discharge status: Against Medical Advice | Payer: Self-pay | Attending: Emergency Medicine | Admitting: Emergency Medicine

## 2016-06-26 ENCOUNTER — Encounter (HOSPITAL_COMMUNITY): Payer: Self-pay

## 2016-06-26 DIAGNOSIS — Z79899 Other long term (current) drug therapy: Secondary | ICD-10-CM | POA: Insufficient documentation

## 2016-06-26 DIAGNOSIS — I10 Essential (primary) hypertension: Secondary | ICD-10-CM | POA: Insufficient documentation

## 2016-06-26 DIAGNOSIS — F17210 Nicotine dependence, cigarettes, uncomplicated: Secondary | ICD-10-CM | POA: Insufficient documentation

## 2016-06-26 DIAGNOSIS — R1031 Right lower quadrant pain: Secondary | ICD-10-CM | POA: Insufficient documentation

## 2016-06-26 NOTE — ED Triage Notes (Signed)
Onset 1 month right groin pain that radiates to right upper thigh and into buttocks.  Pt was seen several weeks and was prescribed muscle relaxants.  Pain is worsening. Pt walking with right leg stiffness.

## 2016-06-26 NOTE — ED Provider Notes (Signed)
Emergency Department Provider Note   I have reviewed the triage vital signs and the nursing notes.   HISTORY  Chief Complaint Groin Pain   HPI Dennis Harris is a 50 y.o. male with PMH of HLD, HTN, and RA presents to the emergency department for evaluation of continued right inguinal pain. The patient has had now approximately 6 weeks of constant a slightly worsening pain in the right inguinal region. He denies any swelling or masses. No fevers or chills. Pain is worse with movement. He describes as radiating pain to his buttock and his upper thigh. No associated leg numbness or weakness. The patient was seen in the emergency department previously and prescribed Robaxin which she's been taking with no relief. He has not been able to take time off of work for rest. No bowel or bladder incontinence.    Past Medical History:  Diagnosis Date  . Exertional shortness of breath    "at times" (11/14/2012)  . Hypercholesteremia   . Hypertension   . Pericarditis ~ 2009  . Rheumatoid arthritis(714.0)     Patient Active Problem List   Diagnosis Date Noted  . SIRS (systemic inflammatory response syndrome) ([place]) 08/07/2014  . Cardiomyopathy- EF 40-45% presumed secondary to acute illness 08/07/2014  . CAP (community acquired pneumonia)   . Right upper quadrant pain   . Acute pericarditis 08/04/2014  . Acute hyponatremia 08/04/2014  . Protein-calorie malnutrition, severe ([place]) 08/04/2014  . Sepsis ([place]) 08/03/2014  . Legionella pneumonia ([place]) 08/03/2014  . Rheumatoid arthritis ([place]) 08/03/2014  . Chest pain 08/03/2014  . Elevated troponin- not fel to be secondary to MI 08/03/2014  . Nausea vomiting and diarrhea 08/03/2014  . Cervical spondylosis without myelopathy 11/06/2012    Past Surgical History:  Procedure Laterality Date  . ANTERIOR CERVICAL DECOMP/DISCECTOMY FUSION  11/14/2012  . ANTERIOR CERVICAL DECOMP/DISCECTOMY FUSION N/A 11/14/2012   Procedure: ANTERIOR CERVICAL  DECOMPRESSION/DISCECTOMY FUSION 1 LEVEL C5-6;  Surgeon: Melina Schools, MD;  Location: [facility];  Service: Orthopedics;  Laterality: N/A;  . APPENDECTOMY  1970's  . CARDIAC CATHETERIZATION  May 2010   normal  . TONSILLECTOMY  1970's?    Current Outpatient Rx  . Order #: [id number] Class: Print  . Order #: [id number] Class: Print  . Order #: [id number] Class: Print  . Order #: [id number] Class: Print  . Order #: [id number] Class: Print  . Order #: [id number] Class: Print  . Order #: [id number] Class: Print  . Order #: [id number] Class: Print  . Order #: [id number] Class: Print    Allergies Bee venom and Shellfish allergy  Family History  Problem Relation Age of Onset  . Cirrhosis Father   . Lupus Sister   . Lupus Sister   . Hypertension Mother   . Stroke Mother   . Diabetes Mother     Social History Social History  Substance Use Topics  . Smoking status: Current Every Day Smoker    Years: 0.00    Types: Cigarettes  . Smokeless tobacco: Never Used  . Alcohol use Yes    Review of Systems  Constitutional: No fever/chills Eyes: No visual changes. ENT: No sore throat. Cardiovascular: Denies chest pain. Respiratory: Denies shortness of breath. Gastrointestinal: No abdominal pain.  No nausea, no vomiting.  No diarrhea.  No constipation. Genitourinary: Negative for dysuria. Musculoskeletal: Negative for back pain. Continued right groin pain.  Skin: Negative for rash. Neurological: Negative for headaches, focal weakness or numbness.  10-point ROS otherwise negative.  ____________________________________________   PHYSICAL EXAM:  VITAL SIGNS: ED Triage Vitals  Enc Vitals Group     BP 06/26/16 1639 173/95     Pulse Rate 06/26/16 1639 89     Resp 06/26/16 1639 14     Temp 06/26/16 1639 98.1 F (36.7 C)     Temp Source 06/26/16 1639 Oral     SpO2 06/26/16 1639 96 %     Weight 06/26/16 1639 150 lb (68 kg)     Height 06/26/16 1639 5' 10.5" (1.791 m)     Pain Score 06/26/16 1637 10     Constitutional: Alert and oriented. Well appearing and in no acute distress. Eyes: Conjunctivae are normal.  Head: Atraumatic. Nose: No congestion/rhinnorhea. Mouth/Throat: Mucous membranes are moist.  Oropharynx non-erythematous. Neck: No stridor.   Cardiovascular: Normal rate, regular rhythm. Good peripheral circulation. Grossly normal heart sounds.   Respiratory: Normal respiratory effort.  No retractions. Lungs CTAB. Gastrointestinal: Soft and nontender. No distention.  Musculoskeletal: No lower extremity tenderness nor edema. No gross deformities of extremities. Limited ROM of the right hip.  Neurologic:  Normal speech and language. No gross focal neurologic deficits are appreciated.  Skin:  Skin is warm, dry and intact. No rash noted.  ____________________________________________   LABS (all labs ordered are listed, but only abnormal results are displayed)  Patient left AMA prior to labs  ____________________________________________  RADIOLOGY  Dg Lumbar Spine 2-3 Views  Result Date: 06/26/2016 CLINICAL DATA:  Right back and groin pain for 1 month. No known injury. EXAM: LUMBAR SPINE - 2-3 VIEW COMPARISON:  05/28/2006 FINDINGS: There is no evidence of lumbar spine fracture. Alignment is normal. Mild to moderate degenerative disc disease at L4-5 is new since prior study in 2007. Other intervertebral disc spaces are maintained. No other bone lesions identified. IMPRESSION: No acute findings. Progressive L4-5 degenerative disc disease since 2007 exam. Electronically Signed   By: Earle Gell M.D.   On: 06/26/2016 20:38   Dg Hip Unilat W Or Wo Pelvis 2-3 Views Right  Result Date: 06/26/2016 CLINICAL DATA:  Right hip pain for 1 month. Difficulty weight-bearing. EXAM: DG HIP (WITH OR WITHOUT PELVIS) 2-3V RIGHT COMPARISON:  AP CT on 08/03/2014 FINDINGS: Chronic avascular necrosis seen involving both femoral heads. Subcortical linear lucency in the superior right femoral head seen on  on the frog-leg lateral view is consistent with osteochondral fracture. No other hip or pelvic fracture identified. No evidence of dislocation. IMPRESSION: Chronic avascular necrosis of both femoral heads, with probable nondisplaced osteochondral fracture in the superior right femoral head. Recommend nonemergent hip MRI for further evaluation. Electronically Signed   By: Earle Gell M.D.   On: 06/26/2016 20:43    ____________________________________________   PROCEDURES  Procedure(s) performed:   Procedures  None ____________________________________________   INITIAL IMPRESSION / ASSESSMENT AND PLAN / ED COURSE  Pertinent labs & imaging results that were available during my care of the patient were reviewed by me and considered in my medical decision making (see chart for details).  Patient presents to the emergency department for continued right inguinal pain. He is point tender in the inguinal crease with no evidence of hernia. Normal testicle exam. Decreased range of motion secondary to pain of the right hip. Normal range of motion of the knee. Very low clinical suspicion for septic arthritis of the right hip. Plan for imaging of the right hip and lumbar spine. Patient with a red flag symptoms to necessitate emergent MRI of the lumbar spine to rule out cauda equina or other spinal emergency. Plan to send labs including  ESR and CRP.   08:42 PM Made aware by nursing staff that the patient left AGAINST MEDICAL ADVICE. He reported that he did not want to wait for his x-rays, medications, referrals, or to speak with me prior to leaving. Plain film of the lumbar spine has resulted showing degenerative disease of the lumbar spine. Hip x-ray pending. Labs not drawn. Unable to provide appropriate follow up or discuss return precautions in detail with the patient.  ____________________________________________  FINAL CLINICAL IMPRESSION(S) / ED DIAGNOSES  Final diagnoses:  Right inguinal pain       MEDICATIONS GIVEN DURING THIS VISIT:  None  NEW OUTPATIENT MEDICATIONS STARTED DURING THIS VISIT:  None   Note:  This document was prepared using Dragon voice recognition software and may include unintentional dictation errors.  Nanda Quinton, MD Emergency Medicine   Margette Fast, MD 06/27/16 [phone]

## 2016-06-26 NOTE — ED Notes (Signed)
Patient returned from XRAY 

## 2016-06-26 NOTE — ED Notes (Signed)
Patient taken to X RAY, will draw labs when they return

## 2016-06-26 NOTE — ED Notes (Signed)
Patient did not want to have blood drawn and wait for results.  Patient said he needed to go to work.  States he would call his MD and set up an appt.

## 2016-06-27 ENCOUNTER — Emergency Department (HOSPITAL_COMMUNITY)
Admission: EM | Admit: 2016-06-27 | Discharge: 2016-06-27 | Disposition: A | Discharge status: Home / Self Care | Payer: Self-pay | Attending: Emergency Medicine | Admitting: Emergency Medicine

## 2016-06-27 ENCOUNTER — Encounter (HOSPITAL_COMMUNITY): Payer: Self-pay

## 2016-06-27 DIAGNOSIS — Z79899 Other long term (current) drug therapy: Secondary | ICD-10-CM | POA: Insufficient documentation

## 2016-06-27 DIAGNOSIS — I10 Essential (primary) hypertension: Secondary | ICD-10-CM | POA: Insufficient documentation

## 2016-06-27 DIAGNOSIS — M87051 Idiopathic aseptic necrosis of right femur: Secondary | ICD-10-CM

## 2016-06-27 DIAGNOSIS — M87052 Idiopathic aseptic necrosis of left femur: Secondary | ICD-10-CM

## 2016-06-27 DIAGNOSIS — M87851 Other osteonecrosis, right femur: Secondary | ICD-10-CM | POA: Insufficient documentation

## 2016-06-27 DIAGNOSIS — M958 Other specified acquired deformities of musculoskeletal system: Secondary | ICD-10-CM | POA: Insufficient documentation

## 2016-06-27 DIAGNOSIS — F17210 Nicotine dependence, cigarettes, uncomplicated: Secondary | ICD-10-CM | POA: Insufficient documentation

## 2016-06-27 DIAGNOSIS — M87852 Other osteonecrosis, left femur: Secondary | ICD-10-CM | POA: Insufficient documentation

## 2016-06-27 MED ORDER — IBUPROFEN 400 MG PO TABS: 400.0000 mg | ORAL_TABLET | Freq: Three times a day (TID) | ORAL | 0 refills | Status: DC | PRN

## 2016-06-27 MED ORDER — HYDROCODONE-ACETAMINOPHEN 5-325 MG PO TABS: 1.0000 | ORAL_TABLET | Freq: Four times a day (QID) | ORAL | 0 refills | Status: DC | PRN

## 2016-06-27 MED ORDER — ACETAMINOPHEN 325 MG PO TABS
650.0000 mg | ORAL_TABLET | Freq: Once | ORAL | Status: AC
  Administered 2016-06-27: 650 mg via ORAL
  Filled 2016-06-27: qty 2

## 2016-06-27 NOTE — ED Triage Notes (Signed)
Right groin pain x 1 month. Was seen for same here yesterday but had to leave to go to work and couldn't stay for blood work. PT is notw back d/t pain and wanting to finish tx.

## 2016-06-27 NOTE — ED Notes (Signed)
ED Provider at bedside. 

## 2016-06-27 NOTE — ED Provider Notes (Signed)
[facility]-EMERGENCY DEPT Provider Note   CSN: [id number] Arrival date & time: 06/27/16  0174     History   Chief Complaint Chief Complaint  Patient presents with  . Groin Pain    HPI Dennis Harris is a 50 y.o. male.  HPI Patient presents to the emergency department for ongoing recurrent right hip pain over the past month.  No prior history of significant right hip pain.  No injury or trauma.  He was seen in emergency department yesterday but had to leave prior to his completion of the evaluation secondary to having to go to work.  He returns now after work requesting the results of his x-rays.  He's tried ibuprofen and Tylenol for his pain without improvement.  No fevers or chills.  He reports it's painful to walk on.  His pain is moderate in severity.   Past Medical History:  Diagnosis Date  . Exertional shortness of breath    "at times" (11/14/2012)  . Hypercholesteremia   . Hypertension   . Pericarditis ~ 2009  . Rheumatoid arthritis(714.0)     Patient Active Problem List   Diagnosis Date Noted  . SIRS (systemic inflammatory response syndrome) (HCC) 08/07/2014  . Cardiomyopathy- EF 40-45% presumed secondary to acute illness 08/07/2014  . CAP (community acquired pneumonia)   . Right upper quadrant pain   . Acute pericarditis 08/04/2014  . Acute hyponatremia 08/04/2014  . Protein-calorie malnutrition, severe (HCC) 08/04/2014  . Sepsis (HCC) 08/03/2014  . Legionella pneumonia (HCC) 08/03/2014  . Rheumatoid arthritis (HCC) 08/03/2014  . Chest pain 08/03/2014  . Elevated troponin- not fel to be secondary to MI 08/03/2014  . Nausea vomiting and diarrhea 08/03/2014  . Cervical spondylosis without myelopathy 11/06/2012    Past Surgical History:  Procedure Laterality Date  . ANTERIOR CERVICAL DECOMP/DISCECTOMY FUSION  11/14/2012  . ANTERIOR CERVICAL DECOMP/DISCECTOMY FUSION N/A 11/14/2012   Procedure: ANTERIOR CERVICAL DECOMPRESSION/DISCECTOMY FUSION 1 LEVEL C5-6;   Surgeon: Venita Lick, MD;  Location: [facility] OR;  Service: Orthopedics;  Laterality: N/A;  . APPENDECTOMY  1970's  . CARDIAC CATHETERIZATION  May 2010   normal  . TONSILLECTOMY  1970's?       Home Medications    Prior to Admission medications   Medication Sig Start Date End Date Taking? Authorizing Provider  colchicine 0.6 MG tablet Take 1 tablet (0.6 mg total) by mouth 2 (two) times daily. Patient not taking: Reported on 06/12/2016 08/08/14   Ripudeep Jenna Luo, MD  docusate sodium (COLACE) 100 MG capsule Take 1 capsule (100 mg total) by mouth 3 (three) times daily as needed for moderate constipation. Also available over the counter Patient not taking: Reported on 06/12/2016 08/08/14   Ripudeep Jenna Luo, MD  HYDROcodone-acetaminophen (NORCO/VICODIN) 5-325 MG tablet Take 1 tablet by mouth every 6 (six) hours as needed for moderate pain. 06/27/16   Azalia Bilis, MD  ibuprofen (ADVIL,MOTRIN) 400 MG tablet Take 1 tablet (400 mg total) by mouth every 8 (eight) hours as needed. 06/27/16   Azalia Bilis, MD  methocarbamol (ROBAXIN) 500 MG tablet Take 1 tablet (500 mg total) by mouth 2 (two) times daily. 06/12/16   Maia Plan, MD  naproxen (NAPROSYN) 500 MG tablet Take 1 tablet (500 mg total) by mouth 2 (two) times daily. Patient not taking: Reported on 06/12/2016 06/28/15   Barrett Henle, PA-C  pantoprazole (PROTONIX) 40 MG tablet Take 1 tablet (40 mg total) by mouth daily. Patient not taking: Reported on 06/12/2016 08/08/14   Ripudeep K  Rai, MD  polyethylene glycol powder (GLYCOLAX) powder Take 17 g by mouth daily as needed for moderate constipation. Patient not taking: Reported on 06/12/2016 08/08/14   Ripudeep Jenna Luo, MD  rifampin (RIFADIN) 300 MG capsule Take 1 capsule (300 mg total) by mouth 2 (two) times daily. X 5 more days Patient not taking: Reported on 06/12/2016 08/08/14   Ripudeep Jenna Luo, MD    Family History Family History  Problem Relation Age of Onset  . Cirrhosis Father   . Lupus  Sister   . Lupus Sister   . Hypertension Mother   . Stroke Mother   . Diabetes Mother     Social History Social History  Substance Use Topics  . Smoking status: Current Every Day Smoker    Years: 0.00    Types: Cigarettes  . Smokeless tobacco: Never Used  . Alcohol use Yes     Allergies   Bee venom and Shellfish allergy   Review of Systems Review of Systems  All other systems reviewed and are negative.    Physical Exam Updated Vital Signs BP (!) 169/101 (BP Location: Right Arm)   Pulse 91   Temp 98.7 F (37.1 C) (Oral)   Resp 16   Ht 5\' 10"  (1.778 m)   Wt 150 lb (68 kg)   SpO2 100%   BMI 21.52 kg/m   Physical Exam  Constitutional: He is oriented to person, place, and time. He appears well-developed and well-nourished.  HENT:  Head: Normocephalic and atraumatic.  Eyes: EOM are normal.  Neck: Normal range of motion.  Pulmonary/Chest: Effort normal.  Abdominal: Soft. He exhibits no distension. There is no tenderness.  Musculoskeletal:  Painful range of motion of the right hip.  No obvious deformity.  Neurological: He is alert and oriented to person, place, and time.  Skin: Skin is warm and dry.  Psychiatric: He has a normal mood and affect. Judgment normal.  Nursing note and vitals reviewed.    ED Treatments / Results  Labs (all labs ordered are listed, but only abnormal results are displayed) Labs Reviewed - No data to display  EKG  EKG Interpretation None       Radiology Dg Lumbar Spine 2-3 Views  Result Date: 06/26/2016 CLINICAL DATA:  Right back and groin pain for 1 month. No known injury. EXAM: LUMBAR SPINE - 2-3 VIEW COMPARISON:  05/28/2006 FINDINGS: There is no evidence of lumbar spine fracture. Alignment is normal. Mild to moderate degenerative disc disease at L4-5 is new since prior study in 2007. Other intervertebral disc spaces are maintained. No other bone lesions identified. IMPRESSION: No acute findings. Progressive L4-5 degenerative  disc disease since 2007 exam. Electronically Signed   By: 2008 M.D.   On: 06/26/2016 20:38   Dg Hip Unilat W Or Wo Pelvis 2-3 Views Right  Result Date: 06/26/2016 CLINICAL DATA:  Right hip pain for 1 month. Difficulty weight-bearing. EXAM: DG HIP (WITH OR WITHOUT PELVIS) 2-3V RIGHT COMPARISON:  AP CT on 08/03/2014 FINDINGS: Chronic avascular necrosis seen involving both femoral heads. Subcortical linear lucency in the superior right femoral head seen on on the frog-leg lateral view is consistent with osteochondral fracture. No other hip or pelvic fracture identified. No evidence of dislocation. IMPRESSION: Chronic avascular necrosis of both femoral heads, with probable nondisplaced osteochondral fracture in the superior right femoral head. Recommend nonemergent hip MRI for further evaluation. Electronically Signed   By: 10/02/2014 M.D.   On: 06/26/2016 20:43    Procedures  Procedures (including critical care time)  Medications Ordered in ED Medications  acetaminophen (TYLENOL) tablet 650 mg (not administered)     Initial Impression / Assessment and Plan / ED Course  I have reviewed the triage vital signs and the nursing notes.  Pertinent labs & imaging results that were available during my care of the patient were reviewed by me and considered in my medical decision making (see chart for details).  Clinical Course     Hip imaging performed yesterday demonstrates bilateral avascular necrosis with osteochondral defect of the right femoral head.  He'll need orthopedic follow-up for this.  Home with anti-inflammatories and short course of pain medication.  Final Clinical Impressions(s) / ED Diagnoses   Final diagnoses:  Osteochondral defect of condyle of femur  Avascular necrosis of bone of right hip (HCC)  Avascular necrosis of bone of left hip (HCC)    New Prescriptions New Prescriptions   HYDROCODONE-ACETAMINOPHEN (NORCO/VICODIN) 5-325 MG TABLET    Take 1 tablet by mouth  every 6 (six) hours as needed for moderate pain.   IBUPROFEN (ADVIL,MOTRIN) 400 MG TABLET    Take 1 tablet (400 mg total) by mouth every 8 (eight) hours as needed.     Azalia Bilis, MD 06/27/16 [phone]

## 2016-11-01 ENCOUNTER — Emergency Department (HOSPITAL_COMMUNITY)
Admission: EM | Admit: 2016-11-01 | Discharge: 2016-11-01 | Disposition: A | Discharge status: Home / Self Care | Payer: Self-pay | Attending: Emergency Medicine | Admitting: Emergency Medicine

## 2016-11-01 ENCOUNTER — Encounter (HOSPITAL_COMMUNITY): Payer: Self-pay

## 2016-11-01 DIAGNOSIS — Z79899 Other long term (current) drug therapy: Secondary | ICD-10-CM | POA: Insufficient documentation

## 2016-11-01 DIAGNOSIS — M25551 Pain in right hip: Secondary | ICD-10-CM | POA: Insufficient documentation

## 2016-11-01 DIAGNOSIS — F17210 Nicotine dependence, cigarettes, uncomplicated: Secondary | ICD-10-CM | POA: Insufficient documentation

## 2016-11-01 DIAGNOSIS — I10 Essential (primary) hypertension: Secondary | ICD-10-CM | POA: Insufficient documentation

## 2016-11-01 DIAGNOSIS — G8929 Other chronic pain: Secondary | ICD-10-CM | POA: Insufficient documentation

## 2016-11-01 MED ORDER — KETOROLAC TROMETHAMINE 60 MG/2ML IM SOLN
30.0000 mg | Freq: Once | INTRAMUSCULAR | Status: AC
  Administered 2016-11-01: 30 mg via INTRAMUSCULAR
  Filled 2016-11-01: qty 2

## 2016-11-01 MED ORDER — DICLOFENAC SODIUM 50 MG PO TBEC: 50.0000 mg | DELAYED_RELEASE_TABLET | Freq: Two times a day (BID) | ORAL | 0 refills | Status: DC

## 2016-11-01 MED ORDER — CYCLOBENZAPRINE HCL 10 MG PO TABS: 10.0000 mg | ORAL_TABLET | Freq: Two times a day (BID) | ORAL | 0 refills | Status: DC | PRN

## 2016-11-01 NOTE — ED Provider Notes (Signed)
[facility]-EMER[facility] DEPT Provider Note   CSN: [id number] Arrival date & time: 11/01/16  1909   By signing my name below, I, Teofilo Pod, attest that this documentation has been prepared under the direction and in the presence of Kerrie Buffalo, NP. Electronically Signed: Teofilo Pod, ED Scribe. 11/01/2016. 8:04 PM.   History   Chief Complaint Chief Complaint  Patient presents with  . Hip Pain   The history is provided by the patient. No language interpreter was used.   HPI Comments:  Dennis Harris is a 51 y.o. male with PMHx of HTN who presents to the Emergency Department complaining of constant bilateral hip pain x 4 months. Pt was seen here by Dr. Patria Mane 4 months ago and was diagnosed with avascular necrosis of the right and left femoral heads. He states that his current pain is "unbearable." He notes that he is unable to see a surgeon due to lack of insurance. He denies any new injury/trauma. Pt has been taking ibuprofen and Aleve with no relief. Pt denies other associated symptoms.   Past Medical History:  Diagnosis Date  . Exertional shortness of breath    "at times" (11/14/2012)  . Hypercholesteremia   . Hypertension   . Pericarditis ~ 2009  . Rheumatoid arthritis(714.0)     Patient Active Problem List   Diagnosis Date Noted  . SIRS (systemic inflammatory response syndrome) (HCC) 08/07/2014  . Cardiomyopathy- EF 40-45% presumed secondary to acute illness 08/07/2014  . CAP (community acquired pneumonia)   . Right upper quadrant pain   . Acute pericarditis 08/04/2014  . Acute hyponatremia 08/04/2014  . Protein-calorie malnutrition, severe (HCC) 08/04/2014  . Sepsis (HCC) 08/03/2014  . Legionella pneumonia (HCC) 08/03/2014  . Rheumatoid arthritis (HCC) 08/03/2014  . Chest pain 08/03/2014  . Elevated troponin- not fel to be secondary to MI 08/03/2014  . Nausea vomiting and diarrhea 08/03/2014  . Cervical spondylosis without myelopathy 11/06/2012    Past  Surgical History:  Procedure Laterality Date  . ANTERIOR CERVICAL DECOMP/DISCECTOMY FUSION  11/14/2012  . ANTERIOR CERVICAL DECOMP/DISCECTOMY FUSION N/A 11/14/2012   Procedure: ANTERIOR CERVICAL DECOMPRESSION/DISCECTOMY FUSION 1 LEVEL C5-6;  Surgeon: Venita Lick, MD;  Location: [facility] OR;  Service: Orthopedics;  Laterality: N/A;  . APPENDECTOMY  1970's  . CARDIAC CATHETERIZATION  May 2010   normal  . TONSILLECTOMY  1970's?       Home Medications    Prior to Admission medications   Medication Sig Start Date End Date Taking? Authorizing Provider  colchicine 0.6 MG tablet Take 1 tablet (0.6 mg total) by mouth 2 (two) times daily. Patient not taking: Reported on 06/12/2016 08/08/14   Ripudeep Jenna Luo, MD  cyclobenzaprine (FLEXERIL) 10 MG tablet Take 1 tablet (10 mg total) by mouth 2 (two) times daily as needed for muscle spasms. 11/01/16   Lexie Morini Orlene Och, NP  diclofenac (VOLTAREN) 50 MG EC tablet Take 1 tablet (50 mg total) by mouth 2 (two) times daily. 11/01/16   Veeda Virgo Orlene Och, NP  docusate sodium (COLACE) 100 MG capsule Take 1 capsule (100 mg total) by mouth 3 (three) times daily as needed for moderate constipation. Also available over the counter Patient not taking: Reported on 06/12/2016 08/08/14   Ripudeep Jenna Luo, MD  HYDROcodone-acetaminophen (NORCO/VICODIN) 5-325 MG tablet Take 1 tablet by mouth every 6 (six) hours as needed for moderate pain. 06/27/16   Azalia Bilis, MD  ibuprofen (ADVIL,MOTRIN) 400 MG tablet Take 1 tablet (400 mg total) by mouth every 8 (  eight) hours as needed. 06/27/16   Azalia Bilis, MD  methocarbamol (ROBAXIN) 500 MG tablet Take 1 tablet (500 mg total) by mouth 2 (two) times daily. 06/12/16   Maia Plan, MD  naproxen (NAPROSYN) 500 MG tablet Take 1 tablet (500 mg total) by mouth 2 (two) times daily. Patient not taking: Reported on 06/12/2016 06/28/15   Barrett Henle, PA-C  pantoprazole (PROTONIX) 40 MG tablet Take 1 tablet (40 mg total) by mouth daily. Patient not  taking: Reported on 06/12/2016 08/08/14   Ripudeep Jenna Luo, MD  polyethylene glycol powder (GLYCOLAX) powder Take 17 g by mouth daily as needed for moderate constipation. Patient not taking: Reported on 06/12/2016 08/08/14   Ripudeep Jenna Luo, MD  rifampin (RIFADIN) 300 MG capsule Take 1 capsule (300 mg total) by mouth 2 (two) times daily. X 5 more days Patient not taking: Reported on 06/12/2016 08/08/14   Ripudeep Jenna Luo, MD    Family History Family History  Problem Relation Age of Onset  . Cirrhosis Father   . Lupus Sister   . Lupus Sister   . Hypertension Mother   . Stroke Mother   . Diabetes Mother     Social History Social History  Substance Use Topics  . Smoking status: Current Every Day Smoker    Years: 0.00    Types: Cigarettes  . Smokeless tobacco: Never Used  . Alcohol use Yes     Allergies   Bee venom and Shellfish allergy   Review of Systems Review of Systems  Constitutional: Negative for fever.  Gastrointestinal: Negative for nausea and vomiting.  Genitourinary: Negative for dysuria.  Musculoskeletal: Positive for arthralgias.       Right hip pain  Skin: Negative for wound.  Neurological: Negative for headaches.  Psychiatric/Behavioral: Sleep disturbance: due to pain.     Physical Exam Updated Vital Signs BP (!) 148/83 (BP Location: Right Arm)   Pulse 83   Temp 98.3 F (36.8 C) (Oral)   Resp 18   Ht 5\' 10"  (1.778 m)   Wt 72.6 kg   SpO2 97%   BMI 22.96 kg/m   Physical Exam  Constitutional: He appears well-developed and well-nourished. No distress.  HENT:  Head: Normocephalic and atraumatic.  Eyes: Conjunctivae are normal.  Cardiovascular: Normal rate, regular rhythm and intact distal pulses.   Radial pulses 2+  Pulmonary/Chest: Effort normal.  Abdominal: He exhibits no distension.  Musculoskeletal:  Full ROM of upper extremities. Grips equal.   Neurological: He is alert.  Patella reflexes 2+  Skin: Skin is warm and dry.  Psychiatric: He has  a normal mood and affect.  Nursing note and vitals reviewed.    ED Treatments / Results  DIAGNOSTIC STUDIES:  Oxygen Saturation is 98% on RA, normal by my interpretation.    COORDINATION OF CARE:  8:01 PM Discussed treatment plan with pt at bedside and pt agreed to plan.   Labs (all labs ordered are listed, but only abnormal results are displayed) Labs Reviewed - No data to display  Radiology No results found.  Procedures Procedures (including critical care time)  Medications Ordered in ED Medications  ketorolac (TORADOL) injection 30 mg (30 mg Intramuscular Given 11/01/16 2025)     Initial Impression / Assessment and Plan / ED Course  I have reviewed the triage vital signs and the nursing notes.  Final Clinical Impressions(s) / ED Diagnoses  51 y.o. male with hx of chronic hip pain stable for d/c without new injury or change  in pain. Will treat for pain and Case Manager arranged follow up for the patient.   Final diagnoses:  Chronic hip pain, right    New Prescriptions Discharge Medication List as of 11/01/2016  9:29 PM    START taking these medications   Details  cyclobenzaprine (FLEXERIL) 10 MG tablet Take 1 tablet (10 mg total) by mouth 2 (two) times daily as needed for muscle spasms., Starting Tue 11/01/2016, Print    diclofenac (VOLTAREN) 50 MG EC tablet Take 1 tablet (50 mg total) by mouth 2 (two) times daily., Starting Tue 11/01/2016, Print      I personally performed the services described in this documentation, which was scribed in my presence. The recorded information has been reviewed and is accurate.     [place], NP 11/03/16 1602    Nira Conn, MD 11/04/16 [phone]

## 2016-11-01 NOTE — ED Notes (Signed)
Pt c/o right hip pain. See providers assessment.

## 2016-11-01 NOTE — ED Triage Notes (Signed)
Pt was diagnosed with "needing a new hip" two months ago and since has had pain in his right hip and lower back.  Patient denies any new injury. States he needs something to help with the pain because he does not have insurance. Patient is ambulatory

## 2016-11-01 NOTE — Discharge Instructions (Signed)
Do not drive while taking the muscle relaxant as it will make you sleepy. You do not need to take ibuprofen or Aleve while taking these medications. Follow up as discussed with the Case Manager.

## 2016-11-01 NOTE — ED Notes (Signed)
Pt stable, understands discharge instructions, and reasons for return.   

## 2016-11-04 NOTE — Care Management (Signed)
ED CM consulted to assist patient with access to primary care. CM met with patient in FT. Discussed the [facility] patient is agreeable. Patient instructed to call in the morning and CM will send in-basket message to scheduler as well. Patient verbalized understanding teach back done. Updated Hope NP on FT. No further CM needs identified.

## 2016-11-07 ENCOUNTER — Inpatient Hospital Stay (INDEPENDENT_AMBULATORY_CARE_PROVIDER_SITE_OTHER): Payer: Self-pay | Admitting: Physician Assistant

## 2016-11-15 ENCOUNTER — Encounter: Payer: Self-pay | Admitting: Gastroenterology

## 2016-11-15 ENCOUNTER — Ambulatory Visit (INDEPENDENT_AMBULATORY_CARE_PROVIDER_SITE_OTHER): Payer: Self-pay | Admitting: Physician Assistant

## 2016-11-15 ENCOUNTER — Encounter (INDEPENDENT_AMBULATORY_CARE_PROVIDER_SITE_OTHER): Payer: Self-pay | Admitting: Physician Assistant

## 2016-11-15 VITALS — BP 179/97 | HR 85 | Temp 98.2°F | Ht 71.5 in | Wt 145.0 lb

## 2016-11-15 DIAGNOSIS — Z1211 Encounter for screening for malignant neoplasm of colon: Secondary | ICD-10-CM

## 2016-11-15 DIAGNOSIS — M8700 Idiopathic aseptic necrosis of unspecified bone: Secondary | ICD-10-CM

## 2016-11-15 DIAGNOSIS — M25551 Pain in right hip: Secondary | ICD-10-CM

## 2016-11-15 DIAGNOSIS — Z23 Encounter for immunization: Secondary | ICD-10-CM

## 2016-11-15 DIAGNOSIS — Z862 Personal history of diseases of the blood and blood-forming organs and certain disorders involving the immune mechanism: Secondary | ICD-10-CM

## 2016-11-15 DIAGNOSIS — I10 Essential (primary) hypertension: Secondary | ICD-10-CM

## 2016-11-15 MED ORDER — MELOXICAM 7.5 MG PO TABS: 7.5000 mg | ORAL_TABLET | Freq: Every day | ORAL | 0 refills | Status: DC

## 2016-11-15 MED ORDER — TRAMADOL HCL 50 MG PO TABS: 50.0000 mg | ORAL_TABLET | Freq: Three times a day (TID) | ORAL | 0 refills | Status: DC | PRN

## 2016-11-15 MED ORDER — LOSARTAN POTASSIUM-HCTZ 50-12.5 MG PO TABS: 1.0000 | ORAL_TABLET | Freq: Every day | ORAL | 1 refills | Status: DC

## 2016-11-15 MED FILL — traMADol HCL 50 MG TABS: 50 | 10 days supply | Qty: 30 | Fill #0

## 2016-11-15 MED FILL — ?LOSARTAN-HCTZ 50-12.5 MG T: 50-12.5 | 30 days supply | Qty: 30 | Fill #0

## 2016-11-15 MED FILL — ?MELOXICAM 7.5 MG TABLET: 7.5 | 30 days supply | Qty: 30 | Fill #0

## 2016-11-15 NOTE — Progress Notes (Signed)
Subjective:  Patient ID: Dennis Harris, male    DOB: 06/18/1966  Age: 51 y.o. MRN: [id number]  CC: Establish care  HPI Dennis Harris is a 51 y.o. male with a PMH of HTN, RA, and avascular necrosis presents for right hip pain. Onset approximately 6 months ago. Had been to the hospital several times believing he had a hernia but was eventually found to have avascular necrosis. Imaging found the following:  XR hip 06/26/16: Chronic avascular necrosis of both femoral heads, with probable nondisplaced osteochondral fracture in the superior right femoral head. Recommend nonemergent hip MRI for further evaluation.  Last ED visit on 11/01/16 for hip pain, diagnosed with chronic hip pain right, rx'ed cyclobenzaprine and diclofenac. Medications not effective. Pt denies any other symptoms.     Review of Systems  Constitutional: Negative for chills, fever and malaise/fatigue.  Eyes: Negative for blurred vision.  Respiratory: Negative for shortness of breath.   Cardiovascular: Negative for chest pain and palpitations.  Gastrointestinal: Negative for abdominal pain and nausea.  Genitourinary: Negative for dysuria and hematuria.  Musculoskeletal: Positive for joint pain. Negative for myalgias.  Skin: Negative for rash.  Neurological: Negative for tingling and headaches.  Psychiatric/Behavioral: Negative for depression. The patient is not nervous/anxious.     Objective:  BP (!) 179/97 (BP Location: Right Arm, Patient Position: Sitting, Cuff Size: Normal)   Pulse 85   Temp 98.2 F (36.8 C) (Oral)   Ht 5' 11.5" (1.816 m)   Wt 145 lb (65.8 kg)   SpO2 100%   BMI 19.94 kg/m   BP/Weight 11/15/2016 11/01/2016 06/27/2016  Systolic BP 179 148 172  Diastolic BP 97 83 97  Wt. (Lbs) 145 160 150  BMI 19.94 22.96 21.52      Physical Exam  Constitutional: He is oriented to person, place, and time.  thin, in discomfort when weight bearing, polite  HENT:  Head: Normocephalic and  atraumatic.  Eyes: No scleral icterus.  Neck: Normal range of motion. Neck supple. No thyromegaly present.  Cardiovascular: Normal rate, regular rhythm and normal heart sounds.   Pulmonary/Chest: Effort normal and breath sounds normal.  Musculoskeletal: He exhibits no edema.  Severely impaired gait favoring right side. Right hip strength 4/5, left 5/5, and aROM limited 2/2 pain. No visual deformity of right hip. Right hip not tender to palpation.  Neurological: He is alert and oriented to person, place, and time. No cranial nerve deficit.  Skin: Skin is warm and dry. No rash noted. No erythema. No pallor.  Psychiatric: He has a normal mood and affect. His behavior is normal. Thought content normal.  Vitals reviewed.    Assessment & Plan:   1. Avascular necrosis (HCC) - traMADol (ULTRAM) 50 MG tablet; Take 1 tablet (50 mg total) by mouth every 8 (eight) hours as needed.  Dispense: 30 tablet; Refill: 0 - meloxicam (MOBIC) 7.5 MG tablet; Take 1 tablet (7.5 mg total) by mouth daily.  Dispense: 30 tablet; Refill: 0 - Cane adjustable single point  2. Right hip pain - Cane adjustable single point  3. Hypertension, unspecified type - Comprehensive metabolic panel - losartan-hydrochlorothiazide (HYZAAR) 50-12.5 MG tablet; Take 1 tablet by mouth daily.  Dispense: 90 tablet; Refill: 1  4. Encounter for screening colonoscopy - Ambulatory referral to Gastroenterology  5. History of anemia - CBC with Differential    Meds ordered this encounter  Medications  . traMADol (ULTRAM) 50 MG tablet    Sig: Take 1 tablet (50 mg total) by mouth  every 8 (eight) hours as needed.    Dispense:  30 tablet    Refill:  0    Order Specific Question:   Supervising Provider    Answer:   Quentin Angst [id number]  . meloxicam (MOBIC) 7.5 MG tablet    Sig: Take 1 tablet (7.5 mg total) by mouth daily.    Dispense:  30 tablet    Refill:  0    Order Specific Question:   Supervising Provider    Answer:    Quentin Angst [id number]  . losartan-hydrochlorothiazide (HYZAAR) 50-12.5 MG tablet    Sig: Take 1 tablet by mouth daily.    Dispense:  90 tablet    Refill:  1    Order Specific Question:   Supervising Provider    Answer:   Quentin Angst [id number]    Follow-up: Return in about 2 weeks (around 11/29/2016) for HTN.   Loletta Specter PA

## 2016-11-15 NOTE — Patient Instructions (Signed)
Avascular Necrosis Avascular necrosis is a disease resulting from the temporary or permanent loss of blood supply to a bone. This disease may also be known as:  Osteonecrosis.  Aseptic necrosis.  Ischemic bone necrosis. Without proper blood supply, the internal layer of the affected bone dies and the outer layer of the bone may break down. If this process affects a bone near a joint, it may lead to collapse of that joint. Common bones that are affected by this condition include:  The top of your thigh bone (femoral head).  One or more bones in your wrist (scaphoid orlunate).  One or more bones in your foot (metatarsals).  One of the bones in your ankle (navicular). The joint most commonly affected by this condition is the hip joint. Avascular necrosis rarely occurs in more than one bone at a time. What are the causes?  Damage or injury to a bone or joint.  Using corticosteroid medicine for a long period of time.  Changes in your immune or hormone systems.  Excessive exposure to radiation. What increases the risk?  Alcohol abuse.  Previous traumatic injury to a joint.  Using corticosteroid medicines for a long period of time or often.  Having a medical condition such as:  HIV or AIDS.  Diabetes.  Sickle cell disease.  An autoimmune disease. What are the signs or symptoms? The main symptoms of avascular necrosis are pain and decreased motion in the affected bone or joint. In the early stages the pain may be minor and occur only with activity. As avascular necrosis progresses, pain may gradually worsen and occur while at rest. The pain may suddenly become severe if an affected joint collapses. How is this diagnosed? Avascular necrosis may be diagnosed with:  A medical history.  A physical exam.  X-rays.  An MRI.  A bone scan. How is this treated? Treatments may include:  Medicine to help relieve pain.  Avoiding placing any pressure or weight  ontheaffected area. If avascular necrosis occurs in your hip, ankle, or foot, you may be instructed to use crutches or a rolling scooter.  Surgery, such as:  Core decompression. In this surgery, one or more holes are placed in the bone for new blood vessels to grow into. This provides a renewed blood supply to the bone. Core decompression can often reduce pain and pressure in the affected bone and slow the progression of bone and joint destruction.  Osteotomy. In this surgery, the bone is reshaped to reduce stress on the affected area of the joint.  Bone grafting. In this surgery, healthy bone from one part of your body is transplanted to the affected area.  Arthroplasty. Arthroplasty is also known as total joint replacement. In this surgery, the affected surface on one or both sides of a joint is replaced with artificial parts (prostheses).  Electrical stimulation. This may help encourage new bone growth. Follow these instructions at home:  Take medicines only as directed by your health care provider.  Follow your health care provider's recommendations on limiting activities or using crutches to rest your affected joint.  Meet with aphysical therapist as directed by your health care provider.  Keep all follow-up visits as directed by your health care provider. This is important. Contact a health care provider if:  Your pain worsens.  You have decreased motion in your affected joint. Get help right away if: Your pain suddenly becomes severe. This information is not intended to replace advice given to you by your health care   provider. Make sure you discuss any questions you have with your health care provider. Document Released: 12/31/2001 Document Revised: 12/17/2015 Document Reviewed: 09/18/2013 Elsevier Interactive Patient Education  2017 [employer]vier Inc[employer]  

## 2016-11-16 ENCOUNTER — Ambulatory Visit: Payer: Self-pay

## 2016-11-16 ENCOUNTER — Telehealth (INDEPENDENT_AMBULATORY_CARE_PROVIDER_SITE_OTHER): Payer: Self-pay | Admitting: Physician Assistant

## 2016-11-16 LAB — CBC WITH DIFFERENTIAL/PLATELET
BASOS ABS: 0.1 10*3/uL (ref 0.0–0.2)
BASOS: 1 %
EOS (ABSOLUTE): 0.1 10*3/uL (ref 0.0–0.4)
Eos: 1 %
Hematocrit: 38.4 % (ref 37.5–51.0)
Hemoglobin: 12.1 g/dL — ABNORMAL LOW (ref 13.0–17.7)
IMMATURE GRANULOCYTES: 0 %
Immature Grans (Abs): 0 10*3/uL (ref 0.0–0.1)
Lymphocytes Absolute: 1.6 10*3/uL (ref 0.7–3.1)
Lymphs: 20 %
MCH: 24.9 pg — ABNORMAL LOW (ref 26.6–33.0)
MCHC: 31.5 g/dL (ref 31.5–35.7)
MCV: 79 fL (ref 79–97)
MONOS ABS: 0.8 10*3/uL (ref 0.1–0.9)
Monocytes: 10 %
NEUTROS PCT: 68 %
Neutrophils Absolute: 5.5 10*3/uL (ref 1.4–7.0)
PLATELETS: 366 10*3/uL (ref 150–379)
RBC: 4.86 x10E6/uL (ref 4.14–5.80)
RDW: 14.9 % (ref 12.3–15.4)
WBC: 8 10*3/uL (ref 3.4–10.8)

## 2016-11-16 LAB — COMPREHENSIVE METABOLIC PANEL
A/G RATIO: 1 — AB (ref 1.2–2.2)
ALT: 14 IU/L (ref 0–44)
AST: 20 IU/L (ref 0–40)
Albumin: 4.3 g/dL (ref 3.5–5.5)
Alkaline Phosphatase: 97 IU/L (ref 39–117)
BUN/Creatinine Ratio: 12 (ref 9–20)
BUN: 12 mg/dL (ref 6–24)
Bilirubin Total: 0.3 mg/dL (ref 0.0–1.2)
CALCIUM: 10.1 mg/dL (ref 8.7–10.2)
CHLORIDE: 101 mmol/L (ref 96–106)
CO2: 20 mmol/L (ref 18–29)
Creatinine, Ser: 0.98 mg/dL (ref 0.76–1.27)
GFR calc Af Amer: 102 mL/min/{1.73_m2} (ref >59)
GFR calc non Af Amer: 88 mL/min/{1.73_m2} (ref >59)
GLUCOSE: 102 mg/dL — AB (ref 65–99)
Globulin, Total: 4.5 g/dL (ref 1.5–4.5)
Potassium: 4.8 mmol/L (ref 3.5–5.2)
Sodium: 142 mmol/L (ref 134–144)
Total Protein: 8.8 g/dL — ABNORMAL HIGH (ref 6.0–8.5)

## 2016-11-16 NOTE — Telephone Encounter (Signed)
Patient called stated FMLA papers are for daughter's Elizabeth Haff work,  Because she is taking care of patient due to pain. Rodney Booze will come by [employer] to fill out her part on FMLA papers. Please follow up with patient

## 2016-11-16 NOTE — Telephone Encounter (Signed)
I will get to the FMLA as soon as I can.

## 2016-11-16 NOTE — Telephone Encounter (Signed)
FWD to PCP. Tempestt S Roberts, CMA  

## 2016-11-21 ENCOUNTER — Ambulatory Visit: Payer: Self-pay | Attending: Physician Assistant

## 2016-11-24 ENCOUNTER — Other Ambulatory Visit (INDEPENDENT_AMBULATORY_CARE_PROVIDER_SITE_OTHER): Payer: Self-pay | Admitting: Physician Assistant

## 2016-11-24 DIAGNOSIS — M8700 Idiopathic aseptic necrosis of unspecified bone: Secondary | ICD-10-CM

## 2016-11-24 NOTE — Progress Notes (Signed)
Avascular necrosis referral to ortho.

## 2016-11-29 ENCOUNTER — Encounter (INDEPENDENT_AMBULATORY_CARE_PROVIDER_SITE_OTHER): Payer: Self-pay | Admitting: Physician Assistant

## 2016-11-29 ENCOUNTER — Ambulatory Visit (INDEPENDENT_AMBULATORY_CARE_PROVIDER_SITE_OTHER): Payer: Self-pay | Admitting: Physician Assistant

## 2016-11-29 VITALS — BP 163/95 | HR 76 | Temp 97.9°F | Wt 142.2 lb

## 2016-11-29 DIAGNOSIS — I10 Essential (primary) hypertension: Secondary | ICD-10-CM

## 2016-11-29 DIAGNOSIS — M8700 Idiopathic aseptic necrosis of unspecified bone: Secondary | ICD-10-CM

## 2016-11-29 MED ORDER — LOSARTAN POTASSIUM-HCTZ 50-12.5 MG PO TABS: 2.0000 | ORAL_TABLET | Freq: Every day | ORAL | 2 refills | Status: DC

## 2016-11-29 MED ORDER — MELOXICAM 7.5 MG PO TABS: 7.5000 mg | ORAL_TABLET | Freq: Every day | ORAL | 0 refills | Status: DC

## 2016-11-29 MED ORDER — TRAMADOL HCL 50 MG PO TABS: 50.0000 mg | ORAL_TABLET | Freq: Three times a day (TID) | ORAL | 0 refills | Status: DC | PRN

## 2016-11-29 NOTE — Progress Notes (Signed)
Subjective:  Patient ID: Dennis Harris, male    DOB: Dec 31, 1965  Age: 51 y.o. MRN: [id number]  CC: f/u HTN   HPI Dennis Harris is a 51 y.o. male with a PMH of  Avascular Necrosis, HTN, hypercholesterolemia, RA, and pericarditis presents for f/u of HTN. Has been taking Hyzaar for two weeks now. BP is found to be the same today as it was when he was not taking anti-hypertensives. Pt does not feel any different. In regards to avascular necrosis, pt has Ortho next Tuesday for avascular necrosis. Takes one pill of Tramadol in the morning only. Pain is worse at night when laying supine. Denies any other symptoms.     Outpatient Medications Prior to Visit  Medication Sig Dispense Refill  . losartan-hydrochlorothiazide (HYZAAR) 50-12.5 MG tablet Take 1 tablet by mouth daily. 90 tablet 1  . meloxicam (MOBIC) 7.5 MG tablet Take 1 tablet (7.5 mg total) by mouth daily. 30 tablet 0  . traMADol (ULTRAM) 50 MG tablet Take 1 tablet (50 mg total) by mouth every 8 (eight) hours as needed. 30 tablet 0   No facility-administered medications prior to visit.      ROS Review of Systems  Constitutional: Negative for chills, fever and malaise/fatigue.  Eyes: Negative for blurred vision.  Respiratory: Negative for shortness of breath.   Cardiovascular: Negative for chest pain and palpitations.  Gastrointestinal: Negative for abdominal pain and nausea.  Genitourinary: Negative for dysuria and hematuria.  Musculoskeletal: Positive for joint pain. Negative for myalgias.  Skin: Negative for rash.  Neurological: Negative for tingling and headaches.  Psychiatric/Behavioral: Negative for depression. The patient is not nervous/anxious.     Objective:  BP (!) 163/95 (BP Location: Right Arm, Patient Position: Sitting, Cuff Size: Normal)   Pulse 76   Temp 97.9 F (36.6 C) (Oral)   Wt 142 lb 3.2 oz (64.5 kg)   SpO2 100%   BMI 19.56 kg/m   BP/Weight 11/29/2016 11/15/2016 11/01/2016  Systolic BP 163  299 148  Diastolic BP 95 97 83  Wt. (Lbs) 142.2 145 160  BMI 19.56 19.94 22.96      Physical Exam  Constitutional: He is oriented to person, place, and time.  Well developed, well nourished, NAD, polite, using a cane  HENT:  Head: Normocephalic and atraumatic.  Eyes: No scleral icterus.  Neck: Normal range of motion.  Cardiovascular: Normal rate, regular rhythm and normal heart sounds.   Pulmonary/Chest: Effort normal and breath sounds normal.  Musculoskeletal: He exhibits no edema.  Limp favoring right side. TTP at the area of the right femur's greater trochanter.  Neurological: He is alert and oriented to person, place, and time. No cranial nerve deficit. Coordination normal.  Skin: Skin is warm and dry. No rash noted. No erythema. No pallor.  Psychiatric: He has a normal mood and affect. His behavior is normal. Thought content normal.  Vitals reviewed.    Assessment & Plan:   1. Hypertension, unspecified type - Resistant HTN - VAS US RENAL ARTERY DUPLEX; Future - Increase losartan-hydrochlorothiazide (HYZAAR) 50-12.5 MG tablet; Take 2 tablets by mouth daily.  Dispense: 60 tablet; Refill: 2  2. Avascular necrosis (HCC) - Keep ortho appointment for next Tuesday 12/06/16. - Refill traMADol (ULTRAM) 50 MG tablet; Take 1 tablet (50 mg total) by mouth every 8 (eight) hours as needed.  Dispense: 30 tablet; Refill: 0 - Refill meloxicam (MOBIC) 7.5 MG tablet; Take 1 tablet (7.5 mg total) by mouth daily.  Dispense: 30 tablet; Refill: 0.  Don't take until Renal artery [place] results have been reported.   Meds ordered this encounter  Medications  . losartan-hydrochlorothiazide (HYZAAR) 50-12.5 MG tablet    Sig: Take 2 tablets by mouth daily.    Dispense:  60 tablet    Refill:  2    Order Specific Question:   Supervising Provider    Answer:   Quentin Angst [id number]  . traMADol (ULTRAM) 50 MG tablet    Sig: Take 1 tablet (50 mg total) by mouth every 8 (eight) hours as needed.     Dispense:  30 tablet    Refill:  0    Order Specific Question:   Supervising Provider    Answer:   Quentin Angst [id number]  . meloxicam (MOBIC) 7.5 MG tablet    Sig: Take 1 tablet (7.5 mg total) by mouth daily.    Dispense:  30 tablet    Refill:  0    Order Specific Question:   Supervising Provider    Answer:   Quentin Angst [id number]    Follow-up: Return in about 2 months (around 01/29/2017).   Loletta Specter PA

## 2016-11-29 NOTE — Patient Instructions (Addendum)
Please go to your Renal Artery Ultrasound appointment on Friday (12/02/16) at 10:00 am. Don't take Meloxicam until Renal artery [place] results have been reported.  Avascular Necrosis Avascular necrosis is a disease resulting from the temporary or permanent loss of blood supply to a bone. This disease may also be known as:  Osteonecrosis.  Aseptic necrosis.  Ischemic bone necrosis. Without proper blood supply, the internal layer of the affected bone dies and the outer layer of the bone may break down. If this process affects a bone near a joint, it may lead to collapse of that joint. Common bones that are affected by this condition include:  The top of your thigh bone (femoral head).  One or more bones in your wrist (scaphoid orlunate).  One or more bones in your foot (metatarsals).  One of the bones in your ankle (navicular). The joint most commonly affected by this condition is the hip joint. Avascular necrosis rarely occurs in more than one bone at a time. What are the causes?  Damage or injury to a bone or joint.  Using corticosteroid medicine for a long period of time.  Changes in your immune or hormone systems.  Excessive exposure to radiation. What increases the risk?  Alcohol abuse.  Previous traumatic injury to a joint.  Using corticosteroid medicines for a long period of time or often.  Having a medical condition such as:  HIV or AIDS.  Diabetes.  Sickle cell disease.  An autoimmune disease. What are the signs or symptoms? The main symptoms of avascular necrosis are pain and decreased motion in the affected bone or joint. In the early stages the pain may be minor and occur only with activity. As avascular necrosis progresses, pain may gradually worsen and occur while at rest. The pain may suddenly become severe if an affected joint collapses. How is this diagnosed? Avascular necrosis may be diagnosed with:  A medical history.  A physical  exam.  X-rays.  An MRI.  A bone scan. How is this treated? Treatments may include:  Medicine to help relieve pain.  Avoiding placing any pressure or weight ontheaffected area. If avascular necrosis occurs in your hip, ankle, or foot, you may be instructed to use crutches or a rolling scooter.  Surgery, such as:  Core decompression. In this surgery, one or more holes are placed in the bone for new blood vessels to grow into. This provides a renewed blood supply to the bone. Core decompression can often reduce pain and pressure in the affected bone and slow the progression of bone and joint destruction.  Osteotomy. In this surgery, the bone is reshaped to reduce stress on the affected area of the joint.  Bone grafting. In this surgery, healthy bone from one part of your body is transplanted to the affected area.  Arthroplasty. Arthroplasty is also known as total joint replacement. In this surgery, the affected surface on one or both sides of a joint is replaced with artificial parts (prostheses).  Electrical stimulation. This may help encourage new bone growth. Follow these instructions at home:  Take medicines only as directed by your health care provider.  Follow your health care provider's recommendations on limiting activities or using crutches to rest your affected joint.  Meet with aphysical therapist as directed by your health care provider.  Keep all follow-up visits as directed by your health care provider. This is important. Contact a health care provider if:  Your pain worsens.  You have decreased motion in your affected  joint. Get help right away if: Your pain suddenly becomes severe. This information is not intended to replace advice given to you by your health care provider. Make sure you discuss any questions you have with your health care provider. Document Released: 12/31/2001 Document Revised: 12/17/2015 Document Reviewed: 09/18/2013 Elsevier Interactive  Patient Education  2017 [employer].

## 2016-12-02 ENCOUNTER — Ambulatory Visit (HOSPITAL_COMMUNITY): Admission: RE | Admit: 2016-12-02 | Payer: Self-pay | Source: Ambulatory Visit

## 2016-12-07 ENCOUNTER — Encounter (HOSPITAL_COMMUNITY): Payer: Self-pay

## 2016-12-07 ENCOUNTER — Other Ambulatory Visit (INDEPENDENT_AMBULATORY_CARE_PROVIDER_SITE_OTHER): Payer: Self-pay | Admitting: Orthopaedic Surgery

## 2016-12-07 ENCOUNTER — Ambulatory Visit (INDEPENDENT_AMBULATORY_CARE_PROVIDER_SITE_OTHER): Payer: Self-pay | Admitting: Orthopaedic Surgery

## 2016-12-07 DIAGNOSIS — M25552 Pain in left hip: Secondary | ICD-10-CM

## 2016-12-07 DIAGNOSIS — M87052 Idiopathic aseptic necrosis of left femur: Secondary | ICD-10-CM

## 2016-12-07 DIAGNOSIS — M87051 Idiopathic aseptic necrosis of right femur: Secondary | ICD-10-CM | POA: Insufficient documentation

## 2016-12-07 DIAGNOSIS — M25551 Pain in right hip: Secondary | ICD-10-CM

## 2016-12-07 MED ORDER — HYDROCODONE-ACETAMINOPHEN 5-325 MG PO TABS: 1.0000 | ORAL_TABLET | Freq: Two times a day (BID) | ORAL | 0 refills | Status: DC | PRN

## 2016-12-07 NOTE — Progress Notes (Signed)
Office Visit Note   Patient: Dennis Harris           Date of Birth: February 20, 1966           MRN: [id number] Visit Date: 12/07/2016              Requested by: Loletta Specter, PA-C [address] [place], [place] [address] PCP: Loletta Specter, PA-C   Assessment & Plan: Visit Diagnoses:  1. Pain of right hip joint   2. Pain of left hip joint   3. Avascular necrosis of bones of both hips (HCC)     Plan: Due to the severity of his right hip pain combined with the subcortical femoral head collapse he definitely needs a hip replacement. I showed him his x-rays and explained in detail with the surgery involves. The third discussion of the risk and benefits of surgery. I showed him his x-rays and went over hip model talk about what is intraoperative and postoperative course with involved. All questions were encouraged and answered. We'll try to get this on the schedule soon and we'll see him back in 2 weeks postoperative. He'll continue with a cane and I did give him some hydrocodone for the severity of his pain.  Follow-Up Instructions: Return for 2 weeks post-op.   Orders:  No orders of the defined types were placed in this encounter.  No orders of the defined types were placed in this encounter.     Procedures: No procedures performed   Clinical Data: No additional findings.   Subjective: No chief complaint on file. The [facility] evaluate bilateral hip avascular necrosis with severe right hip pain. I was able to review the x-rays and see that he does have some like a collapse of his femoral head at this point. His pain is severe. It is daily. It is detrimentally affected his activity is daily living, his quality of life, and his mobility. He's having to get around with a cane. It's waking him up at night. It's been rapidly worsening over the last 5-6 months.  HPI  Review of Systems He currently denies any  headache, chest pain, shortness of breath, fever, chills, nausea, vomiting.  Objective: Vital Signs: There were no vitals taken for this visit.  Physical Exam He is alert and oriented 3 and in no acute distress Ortho Exam Examination of his right hip shows significant guarding with attempts of internal/external rotation due to the severity of his pain. His left hip moves around much more fluidly with minimal pain. Specialty Comments:  No specialty comments available.  Imaging: No results found. X-rays on the canopy system) reviewed by me show severe avascular necrosis of both hips. The right hip show some evidence of subcutaneous cortical osteochondral fracture.  PMFS History: Patient Active Problem List   Diagnosis Date Noted  . Avascular necrosis of bones of both hips (HCC) 12/07/2016  . Pain of left hip joint 12/07/2016  . Pain of right hip joint 12/07/2016  . SIRS (systemic inflammatory response syndrome) (HCC) 08/07/2014  . Cardiomyopathy- EF 40-45% presumed secondary to acute illness 08/07/2014  . CAP (community acquired pneumonia)   . Right upper quadrant pain   . Acute pericarditis 08/04/2014  . Acute hyponatremia 08/04/2014  . Protein-calorie malnutrition, severe (HCC) 08/04/2014  . Sepsis (HCC) 08/03/2014  . Legionella pneumonia (HCC) 08/03/2014  . Rheumatoid arthritis (HCC) 08/03/2014  . Chest pain 08/03/2014  . Elevated troponin- not  fel to be secondary to MI 08/03/2014  . Nausea vomiting and diarrhea 08/03/2014  . Cervical spondylosis without myelopathy 11/06/2012   Past Medical History:  Diagnosis Date  . Exertional shortness of breath    "at times" (11/14/2012)  . Hypercholesteremia   . Hypertension   . Pericarditis ~ 2009  . Rheumatoid arthritis(714.0)     Family History  Problem Relation Age of Onset  . Cirrhosis Father   . Lupus Sister   . Lupus Sister   . Hypertension Mother   . Stroke Mother   . Diabetes Mother     Past Surgical History:    Procedure Laterality Date  . ANTERIOR CERVICAL DECOMP/DISCECTOMY FUSION  11/14/2012  . ANTERIOR CERVICAL DECOMP/DISCECTOMY FUSION N/A 11/14/2012   Procedure: ANTERIOR CERVICAL DECOMPRESSION/DISCECTOMY FUSION 1 LEVEL C5-6;  Surgeon: Venita Lick, MD;  Location: [facility] OR;  Service: Orthopedics;  Laterality: N/A;  . APPENDECTOMY  1970's  . CARDIAC CATHETERIZATION  May 2010   normal  . TONSILLECTOMY  1970's?   Social History   Occupational History  . Not on file.   Social History Main Topics  . Smoking status: Current Every Day Smoker    Years: 0.00    Types: Cigarettes  . Smokeless tobacco: Never Used  . Alcohol use Yes  . Drug use: No  . Sexual activity: Yes

## 2016-12-08 ENCOUNTER — Telehealth (INDEPENDENT_AMBULATORY_CARE_PROVIDER_SITE_OTHER): Payer: Self-pay | Admitting: Physician Assistant

## 2016-12-08 ENCOUNTER — Ambulatory Visit (HOSPITAL_COMMUNITY)
Admission: RE | Admit: 2016-12-08 | Discharge: 2016-12-08 | Disposition: A | Discharge status: Home / Self Care | Payer: Self-pay | Source: Ambulatory Visit | Attending: Physician Assistant | Admitting: Physician Assistant

## 2016-12-08 ENCOUNTER — Other Ambulatory Visit (INDEPENDENT_AMBULATORY_CARE_PROVIDER_SITE_OTHER): Payer: Self-pay | Admitting: Physician Assistant

## 2016-12-08 DIAGNOSIS — I701 Atherosclerosis of renal artery: Secondary | ICD-10-CM | POA: Insufficient documentation

## 2016-12-08 DIAGNOSIS — I10 Essential (primary) hypertension: Secondary | ICD-10-CM

## 2016-12-08 MED FILL — ?LOSARTAN-HCTZ 50-12.5 MG T: 50-12.5 | 30 days supply | Qty: 60 | Fill #0

## 2016-12-08 NOTE — Telephone Encounter (Signed)
Patient called stated just wanted to inform [name] PA that he will be having hip surgery next Tuesday, Dr Amie Critchley from [facility] will be performing the surgery.

## 2016-12-08 NOTE — Progress Notes (Signed)
*  PRELIMINARY RESULTS* Vascular Ultrasound Renal Artery Duplex has been completed.  Preliminary findings: Bilateral 1-59% renal artery stenosis.  Farrel Demark, RDMS, RVT  12/08/2016, 10:08 AM

## 2016-12-09 ENCOUNTER — Encounter (HOSPITAL_COMMUNITY)
Admission: RE | Admit: 2016-12-09 | Discharge: 2016-12-09 | Disposition: A | Discharge status: Home / Self Care | Payer: Self-pay | Source: Ambulatory Visit | Attending: Orthopaedic Surgery | Admitting: Orthopaedic Surgery

## 2016-12-09 ENCOUNTER — Encounter (HOSPITAL_COMMUNITY): Payer: Self-pay

## 2016-12-09 DIAGNOSIS — Z01810 Encounter for preprocedural cardiovascular examination: Secondary | ICD-10-CM | POA: Insufficient documentation

## 2016-12-09 DIAGNOSIS — Z01812 Encounter for preprocedural laboratory examination: Secondary | ICD-10-CM | POA: Insufficient documentation

## 2016-12-09 DIAGNOSIS — M87851 Other osteonecrosis, right femur: Secondary | ICD-10-CM | POA: Insufficient documentation

## 2016-12-09 HISTORY — DX: Idiopathic aseptic necrosis of unspecified femur: M87.059

## 2016-12-09 HISTORY — DX: Pneumonia, unspecified organism: J18.9

## 2016-12-09 LAB — COMPREHENSIVE METABOLIC PANEL
ALT: 16 U/L — ABNORMAL LOW (ref 17–63)
AST: 19 U/L (ref 15–41)
Albumin: 3.5 g/dL (ref 3.5–5.0)
Alkaline Phosphatase: 94 U/L (ref 38–126)
Anion gap: 9 (ref 5–15)
BUN: 9 mg/dL (ref 6–20)
CHLORIDE: 104 mmol/L (ref 101–111)
CO2: 24 mmol/L (ref 22–32)
Calcium: 9.2 mg/dL (ref 8.9–10.3)
Creatinine, Ser: 0.86 mg/dL (ref 0.61–1.24)
Glucose, Bld: 80 mg/dL (ref 65–99)
Potassium: 3.8 mmol/L (ref 3.5–5.1)
SODIUM: 137 mmol/L (ref 135–145)
Total Bilirubin: 0.6 mg/dL (ref 0.3–1.2)
Total Protein: 8.6 g/dL — ABNORMAL HIGH (ref 6.5–8.1)

## 2016-12-09 LAB — CBC
HEMATOCRIT: 36.3 % — AB (ref 39.0–52.0)
Hemoglobin: 11.3 g/dL — ABNORMAL LOW (ref 13.0–17.0)
MCH: 24.5 pg — AB (ref 26.0–34.0)
MCHC: 31.1 g/dL (ref 30.0–36.0)
MCV: 78.7 fL (ref 78.0–100.0)
Platelets: 306 10*3/uL (ref 150–400)
RBC: 4.61 MIL/uL (ref 4.22–5.81)
RDW: 13.5 % (ref 11.5–15.5)
WBC: 7.2 10*3/uL (ref 4.0–10.5)

## 2016-12-09 LAB — SURGICAL PCR SCREEN
MRSA, PCR: NEGATIVE
Staphylococcus aureus: POSITIVE — AB

## 2016-12-09 NOTE — Pre-Procedure Instructions (Signed)
Dennis Harris  12/09/2016      [id number] [address]y Kindle [address] [place] [address] Phone: [phone] Fax: [phone]  [facility] [place], [place] [address] [place] [address] Phone: [phone] Fax: [phone]    Your procedure is scheduled on Tuesday, Dec 13, 2016  Report to [facility] Admitting at 10:00 A.M.  Call this number if you have problems the morning of surgery:  [phone]   Remember:  Do not eat food or drink liquids after midnight Monday, Dec 12, 2016  Take these medicines the morning of surgery with A SIP OF WATER : if needed: pain medication Stop taking Aspirin, vitamins, fish oil and herbal medications. Do not take any NSAIDs ie: Ibuprofen, Advil, Naproxen, BC and Goody Powder or any medication containing Aspirin such as meloxicam (MOBIC); stop now  Do not wear jewelry, make-up or nail polish.  Do not wear lotions, powders, or perfumes, or deoderant.  Do not shave 48 hours prior to surgery.  Men may shave face and neck.  Do not bring valuables to the hospital.  [facility] is not responsible for any belongings or valuables.  Contacts, dentures or bridgework may not be worn into surgery.  Leave your suitcase in the car.  After surgery it may be brought to your room.  For patients admitted to the hospital, discharge time will be determined by your treatment team.  Special instructions:  [facility] - Preparing for Surgery  Before surgery, you can play an important role.  Because skin is not sterile, your skin needs to be as free of germs as possible.  You can reduce the number of germs on you skin by washing with CHG (chlorahexidine gluconate) soap before surgery.  CHG is an antiseptic cleaner which kills germs and bonds with the skin to continue killing germs even after washing.  Please DO NOT use if you have an allergy to CHG or  antibacterial soaps.  If your skin becomes reddened/irritated stop using the CHG and inform your nurse when you arrive at Short Stay.  Do not shave (including legs and underarms) for at least 48 hours prior to the first CHG shower.  You may shave your face.  Please follow these instructions carefully:   1.  Shower with CHG Soap the night before surgery and the morning of Surgery.  2.  If you choose to wash your hair, wash your hair first as usual with your normal shampoo.  3.  After you shampoo, rinse your hair and body thoroughly to remove the Shampoo.  4.  Use CHG as you would any other liquid soap.  You can apply chg directly  to the skin and wash gently with scrungie or a clean washcloth.  5.  Apply the CHG Soap to your body ONLY FROM THE NECK DOWN.  Do not use on open wounds or open sores.  Avoid contact with your eyes, ears, mouth and genitals (private parts).  Wash genitals (private parts) with your normal soap.  6.  Wash thoroughly, paying special attention to the area where your surgery will be performed.  7.  Thoroughly rinse your body with warm water from the neck down.  8.  DO NOT shower/wash with your normal soap after using and rinsing off the CHG Soap.  9.  Pat yourself dry with a clean towel.  10.  Wear clean pajamas.            11.  Place clean sheets on your bed the night of your first shower and do not sleep with pets.  Day of Surgery  Do not apply any lotions/deodorants the morning of surgery.  Please wear clean clothes to the hospital/surgery center.  Please read over the following fact sheets that you were given. Pain Booklet, Coughing and Deep Breathing, MRSA Information and Surgical Site Infection Prevention

## 2016-12-09 NOTE — Progress Notes (Signed)
Pt denies SOB, chest pain, and being under the care of a cardiologist. Pt denies having an EKG and chest x ray within the last year. Pt denies having labs after 11/15/16. Pt chart forwarded to anesthesia for review.

## 2016-12-10 NOTE — Telephone Encounter (Signed)
Noted  

## 2016-12-12 MED ORDER — TRANEXAMIC ACID 1000 MG/10ML IV SOLN
1000.0000 mg | INTRAVENOUS | Status: AC
  Administered 2016-12-13: 1000 mg via INTRAVENOUS
  Filled 2016-12-12: qty 10

## 2016-12-12 NOTE — Progress Notes (Signed)
Anesthesia Chart Review:  Pt is a 51 year old male scheduled for R total hip arthroplasty anterior approach on 12/13/2016 with Doneen Poisson, MD  - PCP is [name], PA  PMH includes:  HTN, pericarditis (2009), hyperlipidemia, RA. Current smoker. BMI 20. S/p ACDF 11/14/12.   Medications include: losartan-hctz  Preoperative labs reviewed.    EKG 12/09/16: NSR. Minimal voltage criteria for LVH, may be normal variant. Early repolarization  Renal artery [place] 12/08/16:  - Bilateral 1-59% renal artery stenosis. - Bilateral normal intrarenal resistive indices.  Echo 08/05/14:  - Left ventricle: The cavity size was normal. There was mildconcentric hypertrophy. Systolic function was mildly tomoderately reduced. The estimated ejection fraction was in therange of 40% to 45%. Diffuse hypokinesis. Doppler parameters areconsistent with abnormal left ventricular relaxation (grade 1diastolic dysfunction). There was no evidence of elevatedventricular filling pressure by Doppler parameters. - Aortic valve: There was no regurgitation. - Mitral valve: Structurally normal valve. There was mild regurgitation. - Right ventricle: Systolic function was normal. - Right atrium: The atrium was normal in size. - Tricuspid valve: There was trivial regurgitation. - Pulmonary arteries: Systolic pressure was within the normalrange. - Inferior vena cava: The vessel was normal in size. - Pericardium, extracardiac: A trivial pericardial effusion wasidentified posterior to the heart. - Impressions:Left ventricular systolic function is mildly to moderatelydecreased with duffuse hypokinesis. LV dysfunction might beaffected by tachycardia during acquisition.Only trace pericardial effusion was seen.  Cardiac cath 12/15/08:  1. Minimal coronary artery disease.  2. Normal left ventricular systolic function.  We will continue with medical therapy.  We will try him on some Prevacid.  If no changes, I anticipate pt  can proceed with surgery as scheduled.   Rica Mast, FNP-BC [facility] Short Stay Surgical Center/Anesthesiology Phone: [phone] 12/12/2016 10:07 AM

## 2016-12-13 ENCOUNTER — Encounter (HOSPITAL_COMMUNITY): Payer: Self-pay

## 2016-12-13 ENCOUNTER — Inpatient Hospital Stay (HOSPITAL_COMMUNITY): Payer: Self-pay

## 2016-12-13 ENCOUNTER — Inpatient Hospital Stay (HOSPITAL_COMMUNITY): Payer: Self-pay | Admitting: Emergency Medicine

## 2016-12-13 ENCOUNTER — Inpatient Hospital Stay (HOSPITAL_COMMUNITY): Payer: Self-pay | Admitting: Anesthesiology

## 2016-12-13 ENCOUNTER — Encounter (HOSPITAL_COMMUNITY)
Admission: RE | Disposition: A | Discharge status: Home Health | Payer: Self-pay | Source: Ambulatory Visit | Attending: Orthopaedic Surgery

## 2016-12-13 ENCOUNTER — Inpatient Hospital Stay (HOSPITAL_COMMUNITY)
Admission: RE | Admit: 2016-12-13 | Discharge: 2016-12-15 | DRG: 470 | Disposition: A | Discharge status: Home Health | Payer: Self-pay | Source: Ambulatory Visit | Attending: Orthopaedic Surgery | Admitting: Orthopaedic Surgery

## 2016-12-13 DIAGNOSIS — F17210 Nicotine dependence, cigarettes, uncomplicated: Secondary | ICD-10-CM | POA: Diagnosis present

## 2016-12-13 DIAGNOSIS — I10 Essential (primary) hypertension: Secondary | ICD-10-CM | POA: Diagnosis present

## 2016-12-13 DIAGNOSIS — E7800 Pure hypercholesterolemia, unspecified: Secondary | ICD-10-CM | POA: Diagnosis present

## 2016-12-13 DIAGNOSIS — Z96641 Presence of right artificial hip joint: Secondary | ICD-10-CM

## 2016-12-13 DIAGNOSIS — M87851 Other osteonecrosis, right femur: Principal | ICD-10-CM | POA: Diagnosis present

## 2016-12-13 DIAGNOSIS — M069 Rheumatoid arthritis, unspecified: Secondary | ICD-10-CM | POA: Diagnosis present

## 2016-12-13 DIAGNOSIS — M87051 Idiopathic aseptic necrosis of right femur: Secondary | ICD-10-CM

## 2016-12-13 DIAGNOSIS — Z419 Encounter for procedure for purposes other than remedying health state, unspecified: Secondary | ICD-10-CM

## 2016-12-13 DIAGNOSIS — Z79899 Other long term (current) drug therapy: Secondary | ICD-10-CM

## 2016-12-13 DIAGNOSIS — Z981 Arthrodesis status: Secondary | ICD-10-CM

## 2016-12-13 DIAGNOSIS — I429 Cardiomyopathy, unspecified: Secondary | ICD-10-CM | POA: Diagnosis present

## 2016-12-13 HISTORY — PX: TOTAL HIP ARTHROPLASTY: SHX124

## 2016-12-13 SURGERY — ARTHROPLASTY, HIP, TOTAL, ANTERIOR APPROACH
Anesthesia: Spinal | Site: Hip | Laterality: Right

## 2016-12-13 MED ORDER — SODIUM CHLORIDE 0.9 % IV SOLN
INTRAVENOUS | Status: DC
  Administered 2016-12-13: 18:00:00 via INTRAVENOUS

## 2016-12-13 MED ORDER — DIPHENHYDRAMINE HCL 12.5 MG/5ML PO ELIX
12.5000 mg | ORAL_SOLUTION | ORAL | Status: DC | PRN
  Administered 2016-12-14: 25 mg via ORAL
  Filled 2016-12-13: qty 10

## 2016-12-13 MED ORDER — METOCLOPRAMIDE HCL 5 MG/ML IJ SOLN: 5.0000 mg | Freq: Three times a day (TID) | INTRAMUSCULAR | Status: DC | PRN

## 2016-12-13 MED ORDER — PROPOFOL 1000 MG/100ML IV EMUL
INTRAVENOUS | Status: AC
  Filled 2016-12-13: qty 200

## 2016-12-13 MED ORDER — OXYCODONE HCL 5 MG/5ML PO SOLN: 5.0000 mg | Freq: Once | ORAL | Status: DC | PRN

## 2016-12-13 MED ORDER — LOSARTAN POTASSIUM-HCTZ 50-12.5 MG PO TABS: 2.0000 | ORAL_TABLET | Freq: Every day | ORAL | Status: DC

## 2016-12-13 MED ORDER — HYDRALAZINE HCL 20 MG/ML IJ SOLN
INTRAMUSCULAR | Status: AC
  Filled 2016-12-13: qty 1

## 2016-12-13 MED ORDER — ACETAMINOPHEN 650 MG RE SUPP: 650.0000 mg | Freq: Four times a day (QID) | RECTAL | Status: DC | PRN

## 2016-12-13 MED ORDER — ALUM & MAG HYDROXIDE-SIMETH 200-200-20 MG/5ML PO SUSP: 30.0000 mL | ORAL | Status: DC | PRN

## 2016-12-13 MED ORDER — LABETALOL HCL 5 MG/ML IV SOLN
INTRAVENOUS | Status: AC
  Filled 2016-12-13: qty 4

## 2016-12-13 MED ORDER — HYDROMORPHONE HCL 1 MG/ML IJ SOLN
0.2500 mg | INTRAMUSCULAR | Status: DC | PRN
  Administered 2016-12-13 (×4): 0.5 mg via INTRAVENOUS

## 2016-12-13 MED ORDER — LACTATED RINGERS IV SOLN
INTRAVENOUS | Status: DC
  Administered 2016-12-13 (×2): via INTRAVENOUS

## 2016-12-13 MED ORDER — HYDROMORPHONE HCL 1 MG/ML IJ SOLN
INTRAMUSCULAR | Status: AC
  Filled 2016-12-13: qty 1

## 2016-12-13 MED ORDER — CEFAZOLIN SODIUM-DEXTROSE 2-4 GM/100ML-% IV SOLN
2.0000 g | INTRAVENOUS | Status: AC
  Administered 2016-12-13: 2 g via INTRAVENOUS

## 2016-12-13 MED ORDER — DOCUSATE SODIUM 100 MG PO CAPS
100.0000 mg | ORAL_CAPSULE | Freq: Two times a day (BID) | ORAL | Status: DC
  Administered 2016-12-13 – 2016-12-15 (×4): 100 mg via ORAL
  Filled 2016-12-13 (×4): qty 1

## 2016-12-13 MED ORDER — HYDROCHLOROTHIAZIDE 25 MG PO TABS
25.0000 mg | ORAL_TABLET | Freq: Every day | ORAL | Status: DC
  Administered 2016-12-14 – 2016-12-15 (×2): 25 mg via ORAL
  Filled 2016-12-13 (×2): qty 1

## 2016-12-13 MED ORDER — DEXTROSE 5 % IV SOLN
500.0000 mg | Freq: Four times a day (QID) | INTRAVENOUS | Status: DC | PRN
  Filled 2016-12-13: qty 5

## 2016-12-13 MED ORDER — ACETAMINOPHEN 325 MG PO TABS
650.0000 mg | ORAL_TABLET | Freq: Four times a day (QID) | ORAL | Status: DC | PRN
  Administered 2016-12-14: 650 mg via ORAL
  Filled 2016-12-13: qty 2

## 2016-12-13 MED ORDER — GLYCOPYRROLATE 0.2 MG/ML IJ SOLN
INTRAMUSCULAR | Status: DC | PRN
  Administered 2016-12-13: .4 mg via INTRAVENOUS

## 2016-12-13 MED ORDER — BUPIVACAINE IN DEXTROSE 0.75-8.25 % IT SOLN
INTRATHECAL | Status: DC | PRN
  Administered 2016-12-13: 2 mL via INTRATHECAL

## 2016-12-13 MED ORDER — HYDRALAZINE HCL 20 MG/ML IJ SOLN
5.0000 mg | Freq: Once | INTRAMUSCULAR | Status: AC
  Administered 2016-12-13: 5 mg via INTRAVENOUS

## 2016-12-13 MED ORDER — MIDAZOLAM HCL 2 MG/2ML IJ SOLN
INTRAMUSCULAR | Status: DC | PRN
Start: 2016-12-13 — End: 2016-12-13
  Administered 2016-12-13: 2 mg via INTRAVENOUS

## 2016-12-13 MED ORDER — ASPIRIN 81 MG PO CHEW
81.0000 mg | CHEWABLE_TABLET | Freq: Two times a day (BID) | ORAL | Status: DC
  Administered 2016-12-13 – 2016-12-15 (×4): 81 mg via ORAL
  Filled 2016-12-13 (×4): qty 1

## 2016-12-13 MED ORDER — LABETALOL HCL 5 MG/ML IV SOLN
10.0000 mg | Freq: Once | INTRAVENOUS | Status: AC
  Administered 2016-12-13: 10 mg via INTRAVENOUS

## 2016-12-13 MED ORDER — MENTHOL 3 MG MT LOZG: 1.0000 | LOZENGE | OROMUCOSAL | Status: DC | PRN

## 2016-12-13 MED ORDER — OXYCODONE HCL 5 MG PO TABS
5.0000 mg | ORAL_TABLET | ORAL | Status: DC | PRN
  Administered 2016-12-13: 5 mg via ORAL
  Administered 2016-12-13 – 2016-12-15 (×10): 10 mg via ORAL
  Filled 2016-12-13 (×5): qty 2
  Filled 2016-12-13: qty 1
  Filled 2016-12-13 (×5): qty 2

## 2016-12-13 MED ORDER — LABETALOL HCL 5 MG/ML IV SOLN
5.0000 mg | INTRAVENOUS | Status: DC | PRN
  Administered 2016-12-13 (×3): 5 mg via INTRAVENOUS

## 2016-12-13 MED ORDER — 0.9 % SODIUM CHLORIDE (POUR BTL) OPTIME
TOPICAL | Status: DC | PRN
  Administered 2016-12-13: 1000 mL

## 2016-12-13 MED ORDER — ONDANSETRON HCL 4 MG PO TABS: 4.0000 mg | ORAL_TABLET | Freq: Four times a day (QID) | ORAL | Status: DC | PRN

## 2016-12-13 MED ORDER — ENSURE ENLIVE PO LIQD: 237.0000 mL | Freq: Two times a day (BID) | ORAL | Status: DC

## 2016-12-13 MED ORDER — PROMETHAZINE HCL 25 MG/ML IJ SOLN: 6.2500 mg | INTRAMUSCULAR | Status: DC | PRN

## 2016-12-13 MED ORDER — ONDANSETRON HCL 4 MG/2ML IJ SOLN: 4.0000 mg | Freq: Four times a day (QID) | INTRAMUSCULAR | Status: DC | PRN

## 2016-12-13 MED ORDER — FENTANYL CITRATE (PF) 100 MCG/2ML IJ SOLN
INTRAMUSCULAR | Status: DC | PRN
  Administered 2016-12-13: 25 ug via INTRAVENOUS
  Administered 2016-12-13: 100 ug via INTRAVENOUS
  Administered 2016-12-13: 50 ug via INTRAVENOUS
  Administered 2016-12-13: 25 ug via INTRAVENOUS

## 2016-12-13 MED ORDER — LOSARTAN POTASSIUM 50 MG PO TABS
100.0000 mg | ORAL_TABLET | Freq: Every day | ORAL | Status: DC
  Administered 2016-12-14 – 2016-12-15 (×2): 100 mg via ORAL
  Filled 2016-12-13 (×3): qty 2

## 2016-12-13 MED ORDER — PROPOFOL 500 MG/50ML IV EMUL
INTRAVENOUS | Status: DC | PRN
  Administered 2016-12-13: 75 ug/kg/min via INTRAVENOUS

## 2016-12-13 MED ORDER — CEFAZOLIN SODIUM-DEXTROSE 1-4 GM/50ML-% IV SOLN
1.0000 g | Freq: Four times a day (QID) | INTRAVENOUS | Status: AC
  Administered 2016-12-13 – 2016-12-14 (×2): 1 g via INTRAVENOUS
  Filled 2016-12-13 (×2): qty 50

## 2016-12-13 MED ORDER — CEFAZOLIN SODIUM-DEXTROSE 2-4 GM/100ML-% IV SOLN
INTRAVENOUS | Status: AC
  Filled 2016-12-13: qty 100

## 2016-12-13 MED ORDER — SODIUM CHLORIDE 0.9 % IR SOLN
Status: DC | PRN
  Administered 2016-12-13: 3000 mL

## 2016-12-13 MED ORDER — METHOCARBAMOL 500 MG PO TABS
500.0000 mg | ORAL_TABLET | Freq: Four times a day (QID) | ORAL | Status: DC | PRN
  Administered 2016-12-13 – 2016-12-15 (×4): 500 mg via ORAL
  Filled 2016-12-13 (×4): qty 1

## 2016-12-13 MED ORDER — LIDOCAINE HCL (CARDIAC) 20 MG/ML IV SOLN
INTRAVENOUS | Status: DC | PRN
  Administered 2016-12-13: 60 mg via INTRAVENOUS

## 2016-12-13 MED ORDER — POLYETHYLENE GLYCOL 3350 17 G PO PACK: 17.0000 g | PACK | Freq: Every day | ORAL | Status: DC | PRN

## 2016-12-13 MED ORDER — METOCLOPRAMIDE HCL 5 MG PO TABS: 5.0000 mg | ORAL_TABLET | Freq: Three times a day (TID) | ORAL | Status: DC | PRN

## 2016-12-13 MED ORDER — PHENOL 1.4 % MT LIQD: 1.0000 | OROMUCOSAL | Status: DC | PRN

## 2016-12-13 MED ORDER — FENTANYL CITRATE (PF) 250 MCG/5ML IJ SOLN
INTRAMUSCULAR | Status: AC
  Filled 2016-12-13: qty 5

## 2016-12-13 MED ORDER — OXYCODONE HCL 5 MG PO TABS: 5.0000 mg | ORAL_TABLET | Freq: Once | ORAL | Status: DC | PRN

## 2016-12-13 MED ORDER — KETOROLAC TROMETHAMINE 15 MG/ML IJ SOLN
7.5000 mg | Freq: Four times a day (QID) | INTRAMUSCULAR | Status: AC
  Administered 2016-12-13 – 2016-12-14 (×4): 7.5 mg via INTRAVENOUS
  Filled 2016-12-13 (×4): qty 1

## 2016-12-13 MED ORDER — PROPOFOL 10 MG/ML IV BOLUS
INTRAVENOUS | Status: DC | PRN
  Administered 2016-12-13: 10 mg via INTRAVENOUS
  Administered 2016-12-13: 20 mg via INTRAVENOUS
  Administered 2016-12-13: 50 mg via INTRAVENOUS
  Administered 2016-12-13: 25 mg via INTRAVENOUS

## 2016-12-13 MED ORDER — HYDROMORPHONE HCL 1 MG/ML IJ SOLN
INTRAMUSCULAR | Status: AC
Start: 2016-12-13 — End: 2016-12-14
  Filled 2016-12-13: qty 1

## 2016-12-13 MED ORDER — MIDAZOLAM HCL 2 MG/2ML IJ SOLN
INTRAMUSCULAR | Status: AC
  Filled 2016-12-13: qty 2

## 2016-12-13 MED ORDER — HYDROMORPHONE HCL 1 MG/ML IJ SOLN
1.0000 mg | INTRAMUSCULAR | Status: DC | PRN
  Administered 2016-12-13 – 2016-12-14 (×2): 1 mg via INTRAVENOUS
  Filled 2016-12-13 (×3): qty 1

## 2016-12-13 MED ORDER — CHLORHEXIDINE GLUCONATE 4 % EX LIQD: 60.0000 mL | Freq: Once | CUTANEOUS | Status: DC

## 2016-12-13 SURGICAL SUPPLY — 56 items
APL SKNCLS STERI-STRIP NONHPOA (GAUZE/BANDAGES/DRESSINGS) ×1
BENZOIN TINCTURE PRP APPL 2/3 (GAUZE/BANDAGES/DRESSINGS) ×3 IMPLANT
BLADE CLIPPER SURG (BLADE) IMPLANT
BLADE SAW SGTL 18X1.27X75 (BLADE) ×2 IMPLANT
BLADE SAW SGTL 18X1.27X75MM (BLADE) ×1
CAPT HIP TOTAL 2 ×2 IMPLANT
CELLS DAT CNTRL 66122 CELL SVR (MISCELLANEOUS) ×1 IMPLANT
CLOSURE STERI-STRIP 1/2X4 (GAUZE/BANDAGES/DRESSINGS) ×1
CLOSURE WOUND 1/2 X4 (GAUZE/BANDAGES/DRESSINGS) ×2
CLSR STERI-STRIP ANTIMIC 1/2X4 (GAUZE/BANDAGES/DRESSINGS) ×1 IMPLANT
COVER SURGICAL LIGHT HANDLE (MISCELLANEOUS) ×3 IMPLANT
DRAPE C-ARM 42X72 X-RAY (DRAPES) ×3 IMPLANT
DRAPE STERI IOBAN 125X83 (DRAPES) ×3 IMPLANT
DRAPE U-SHAPE 47X51 STRL (DRAPES) ×9 IMPLANT
DRSG AQUACEL AG ADV 3.5X10 (GAUZE/BANDAGES/DRESSINGS) ×3 IMPLANT
DRSG MEPILEX BORDER 4X8 (GAUZE/BANDAGES/DRESSINGS) ×2 IMPLANT
DURAPREP 26ML APPLICATOR (WOUND CARE) ×3 IMPLANT
ELECT BLADE 4.0 EZ CLEAN MEGAD (MISCELLANEOUS) ×3
ELECT REM PT RETURN 9FT ADLT (ELECTROSURGICAL) ×3
ELECTRODE BLDE 4.0 EZ CLN MEGD (MISCELLANEOUS) ×1 IMPLANT
ELECTRODE REM PT RTRN 9FT ADLT (ELECTROSURGICAL) ×1 IMPLANT
FACESHIELD WRAPAROUND (MASK) ×6 IMPLANT
FACESHIELD WRAPAROUND OR TEAM (MASK) ×2 IMPLANT
GLOVE BIOGEL PI IND STRL 8 (GLOVE) ×2 IMPLANT
GLOVE BIOGEL PI INDICATOR 8 (GLOVE) ×4
GLOVE ECLIPSE 8.0 STRL XLNG CF (GLOVE) ×3 IMPLANT
GLOVE ORTHO TXT STRL SZ7.5 (GLOVE) ×6 IMPLANT
GOWN STRL REUS W/ TWL LRG LVL3 (GOWN DISPOSABLE) ×2 IMPLANT
GOWN STRL REUS W/ TWL XL LVL3 (GOWN DISPOSABLE) ×2 IMPLANT
GOWN STRL REUS W/TWL LRG LVL3 (GOWN DISPOSABLE) ×6
GOWN STRL REUS W/TWL XL LVL3 (GOWN DISPOSABLE) ×6
HANDPIECE INTERPULSE COAX TIP (DISPOSABLE) ×3
HOOD PEEL AWAY FLYTE STAYCOOL (MISCELLANEOUS) ×2 IMPLANT
KIT BASIN OR (CUSTOM PROCEDURE TRAY) ×3 IMPLANT
KIT ROOM TURNOVER OR (KITS) ×3 IMPLANT
MANIFOLD NEPTUNE II (INSTRUMENTS) ×3 IMPLANT
NS IRRIG 1000ML POUR BTL (IV SOLUTION) ×3 IMPLANT
PACK TOTAL JOINT (CUSTOM PROCEDURE TRAY) ×3 IMPLANT
PAD ARMBOARD 7.5X6 YLW CONV (MISCELLANEOUS) ×3 IMPLANT
RETRACTOR WND ALEXIS 18 MED (MISCELLANEOUS) ×1 IMPLANT
RTRCTR WOUND ALEXIS 18CM MED (MISCELLANEOUS) ×3
SET HNDPC FAN SPRY TIP SCT (DISPOSABLE) ×1 IMPLANT
STAPLER VISISTAT 35W (STAPLE) IMPLANT
STRIP CLOSURE SKIN 1/2X4 (GAUZE/BANDAGES/DRESSINGS) ×4 IMPLANT
SUT ETHIBOND NAB CT1 #1 30IN (SUTURE) ×5 IMPLANT
SUT MNCRL AB 4-0 PS2 18 (SUTURE) ×3 IMPLANT
SUT VIC AB 0 CT1 27 (SUTURE) ×3
SUT VIC AB 0 CT1 27XBRD ANBCTR (SUTURE) ×1 IMPLANT
SUT VIC AB 1 CT1 27 (SUTURE) ×3
SUT VIC AB 1 CT1 27XBRD ANBCTR (SUTURE) ×1 IMPLANT
SUT VIC AB 2-0 CT1 27 (SUTURE) ×3
SUT VIC AB 2-0 CT1 TAPERPNT 27 (SUTURE) ×1 IMPLANT
TOWEL OR 17X24 6PK STRL BLUE (TOWEL DISPOSABLE) ×3 IMPLANT
TOWEL OR 17X26 10 PK STRL BLUE (TOWEL DISPOSABLE) ×3 IMPLANT
TRAY CATH 16FR W/PLASTIC CATH (SET/KITS/TRAYS/PACK) ×3 IMPLANT
WATER STERILE IRR 1000ML POUR (IV SOLUTION) ×6 IMPLANT

## 2016-12-13 NOTE — Anesthesia Preprocedure Evaluation (Addendum)
Anesthesia Evaluation  Patient identified by MRN, date of birth, ID band Patient awake    Reviewed: Allergy & Precautions, NPO status , Patient's Chart, lab work & pertinent test results  Airway Mallampati: III  TM Distance: >3 FB Neck ROM: Full    Dental no notable dental hx. (+) Poor Dentition, Missing   Pulmonary Current Smoker (1/2 ppd),    Pulmonary exam normal breath sounds clear to auscultation       Cardiovascular hypertension, Pt. on medications Normal cardiovascular exam Rhythm:Regular Rate:Normal  ECG: NSR, rate 84 ECHO: 08/05/14:  - Left ventricle: The cavity size was normal. There was mildconcentric hypertrophy. Systolic function was mildly tomoderately reduced. The estimated ejection fraction was in therange of 40% to 45%. Diffuse hypokinesis. Doppler parameters areconsistent with abnormal left ventricular relaxation (grade 1diastolic dysfunction). There was no evidence of elevatedventricular filling pressure by Doppler parameters.   Neuro/Psych negative neurological ROS  negative psych ROS   GI/Hepatic negative GI ROS, Neg liver ROS,   Endo/Other  negative endocrine ROS  Renal/GU negative Renal ROS  negative genitourinary   Musculoskeletal  (+) Arthritis , Rheumatoid disorders,    Abdominal   Peds negative pediatric ROS (+)  Hematology  (+) anemia ,   Anesthesia Other Findings Hyperlipidemia  Reproductive/Obstetrics negative OB ROS                            Anesthesia Physical Anesthesia Plan  ASA: II  Anesthesia Plan: Spinal   Post-op Pain Management:    Induction: Intravenous  Airway Management Planned: Simple Face Mask  Additional Equipment:   Intra-op Plan:   Post-operative Plan: Extubation in OR  Informed Consent: I have reviewed the patients History and Physical, chart, labs and discussed the procedure including the risks, benefits and alternatives  for the proposed anesthesia with the patient or authorized representative who has indicated his/her understanding and acceptance.   Dental advisory given  Plan Discussed with: CRNA and Surgeon  Anesthesia Plan Comments:         Anesthesia Quick Evaluation

## 2016-12-13 NOTE — Anesthesia Postprocedure Evaluation (Signed)
Anesthesia Post Note  Patient: Dennis Harris  Procedure(s) Performed: Procedure(s) (LRB): RIGHT TOTAL HIP ARTHROPLASTY ANTERIOR APPROACH (Right)  Patient location during evaluation: PACU Anesthesia Type: Spinal Level of consciousness: oriented and awake and alert Pain management: pain level controlled Vital Signs Assessment: post-procedure vital signs reviewed and stable Respiratory status: spontaneous breathing, respiratory function stable and patient connected to nasal cannula oxygen Cardiovascular status: blood pressure returned to baseline and stable Postop Assessment: no headache and no backache Anesthetic complications: no       Last Vitals:  Vitals:   12/13/16 1524 12/13/16 1530  BP:  (!) 204/102  Pulse: 68 79  Resp: 16 16  Temp:      Last Pain:  Vitals:   12/13/16 1530  TempSrc:   PainSc: Asleep                 Ryan P Ellender

## 2016-12-13 NOTE — Anesthesia Procedure Notes (Signed)
Spinal  Patient location during procedure: OR Start time: 12/13/2016 12:30 PM End time: 12/13/2016 12:40 PM Staffing Anesthesiologist: Karna Christmas P Performed: anesthesiologist  Preanesthetic Checklist Completed: patient identified, surgical consent, pre-op evaluation, timeout performed, IV checked, risks and benefits discussed and monitors and equipment checked Spinal Block Patient position: sitting Prep: DuraPrep Patient monitoring: cardiac monitor, continuous pulse ox and blood pressure Approach: midline Location: L3-4 Injection technique: single-shot Needle Needle type: Pencan  Needle gauge: 24 G Needle length: 9 cm Assessment Sensory level: T10 Additional Notes Functioning IV was confirmed and monitors were applied. Sterile prep and drape, including hand hygiene and sterile gloves were used. The patient was positioned and the spine was prepped. The skin was anesthetized with lidocaine.  Free flow of clear CSF was obtained prior to injecting local anesthetic into the CSF.  The spinal needle aspirated freely following injection.  The needle was carefully withdrawn.  The patient tolerated the procedure well.

## 2016-12-13 NOTE — Brief Op Note (Signed)
12/13/2016  2:09 PM  PATIENT:  Dennis Harris  51 y.o. male  PRE-OPERATIVE DIAGNOSIS:  Avascular necrosis right hip  POST-OPERATIVE DIAGNOSIS:  Avascular necrosis right hip  PROCEDURE:  Procedure(s): RIGHT TOTAL HIP ARTHROPLASTY ANTERIOR APPROACH (Right)  SURGEON:  Surgeon(s) and Role:    Kathryne Hitch, MD - Primary  PHYSICIAN ASSISTANT: Rexene Edison, PA-C  ANESTHESIA:   spinal  EBL:  Total I/O In: 1500 [I.V.:1500] Out: 725 [Urine:525; Blood:200]  COUNTS:  YES  DICTATION: .Other Dictation: Dictation Number [id number]  PLAN OF CARE: Admit to inpatient   PATIENT DISPOSITION:  PACU - hemodynamically stable.   Delay start of Pharmacological VTE agent (>24hrs) due to surgical blood loss or risk of bleeding: no

## 2016-12-13 NOTE — H&P (Signed)
TOTAL HIP ADMISSION H&P  Patient is admitted for right total hip arthroplasty.  Subjective:  Chief Complaint: right hip pain  HPI: Dennis Harris, 51 y.o. male, has a history of pain and functional disability in the right hip(s) due to avascular necrosis and patient has failed non-surgical conservative treatments for greater than 12 weeks to include activity modification.  Onset of symptoms was abrupt starting 1 years ago with rapidlly worsening course since that time.The patient noted no past surgery on the right hip(s).  Patient currently rates pain in the right hip at 10 out of 10 with activity. Patient has night pain, worsening of pain with activity and weight bearing, trendelenberg gait, pain that interfers with activities of daily living and pain with passive range of motion. Patient has evidence of subchondral cysts and femoral head collapse by imaging studies. This condition presents safety issues increasing the risk of falls.  There is no current active infection.  Patient Active Problem List   Diagnosis Date Noted  . Avascular necrosis of hip, right (HCC) 12/13/2016  . Avascular necrosis of bones of both hips (HCC) 12/07/2016  . Pain of left hip joint 12/07/2016  . Pain of right hip joint 12/07/2016  . SIRS (systemic inflammatory response syndrome) (HCC) 08/07/2014  . Cardiomyopathy- EF 40-45% presumed secondary to acute illness 08/07/2014  . CAP (community acquired pneumonia)   . Right upper quadrant pain   . Acute pericarditis 08/04/2014  . Acute hyponatremia 08/04/2014  . Protein-calorie malnutrition, severe (HCC) 08/04/2014  . Sepsis (HCC) 08/03/2014  . Legionella pneumonia (HCC) 08/03/2014  . Rheumatoid arthritis (HCC) 08/03/2014  . Chest pain 08/03/2014  . Elevated troponin- not fel to be secondary to MI 08/03/2014  . Nausea vomiting and diarrhea 08/03/2014  . Cervical spondylosis without myelopathy 11/06/2012   Past Medical History:  Diagnosis Date  . Avascular  necrosis of bone of hip (HCC)    right  . Exertional shortness of breath    "at times" (11/14/2012)  . Hypercholesteremia   . Hypertension   . Pericarditis ~ 2009  . Pneumonia   . Rheumatoid arthritis(714.0)     Past Surgical History:  Procedure Laterality Date  . ANTERIOR CERVICAL DECOMP/DISCECTOMY FUSION  11/14/2012  . ANTERIOR CERVICAL DECOMP/DISCECTOMY FUSION N/A 11/14/2012   Procedure: ANTERIOR CERVICAL DECOMPRESSION/DISCECTOMY FUSION 1 LEVEL C5-6;  Surgeon: Venita Lick, MD;  Location: [facility] OR;  Service: Orthopedics;  Laterality: N/A;  . APPENDECTOMY  1970's  . CARDIAC CATHETERIZATION  May 2010   normal  . TONSILLECTOMY  1970's?    Prescriptions Prior to Admission  Medication Sig Dispense Refill Last Dose  . HYDROcodone-acetaminophen (NORCO/VICODIN) 5-325 MG tablet Take 1-2 tablets by mouth 2 (two) times daily as needed for moderate pain. 60 tablet 0 Past Week at Unknown time  . HYDROCORTISONE EX Apply 1 application topically 2 (two) times daily as needed (for razor bumps/rash/itching.).   Past Month at Unknown time  . ibuprofen (ADVIL,MOTRIN) 200 MG tablet Take 600-800 mg by mouth every 8 (eight) hours as needed (for pain/headache.).   Past Week at Unknown time  . losartan-hydrochlorothiazide (HYZAAR) 50-12.5 MG tablet Take 2 tablets by mouth daily. 60 tablet 2 12/13/2016 at 0530  . meloxicam (MOBIC) 7.5 MG tablet Take 1 tablet (7.5 mg total) by mouth daily. 30 tablet 0 Past Week at Unknown time  . Omega-3 Fatty Acids (FISH OIL) 1000 MG CAPS Take 1,000 mg by mouth daily.   Past Week at Unknown time  . traMADol (ULTRAM) 50 MG  tablet Take 1 tablet (50 mg total) by mouth every 8 (eight) hours as needed. 30 tablet 0 Past Week at Unknown time   Allergies  Allergen Reactions  . Bee Venom Anaphylaxis  . Shellfish Allergy Anaphylaxis and Hives    Social History  Substance Use Topics  . Smoking status: Current Every Day Smoker    Packs/day: 0.25    Years: 0.00    Types: Cigarettes   . Smokeless tobacco: Never Used  . Alcohol use Yes     Comment: 24 ounces of beer daily    Family History  Problem Relation Age of Onset  . Cirrhosis Father   . Lupus Sister   . Lupus Sister   . Hypertension Mother   . Stroke Mother   . Diabetes Mother      Review of Systems  Musculoskeletal: Positive for joint pain.  All other systems reviewed and are negative.   Objective:  Physical Exam  Constitutional: He is oriented to person, place, and time. He appears well-developed and well-nourished.  HENT:  Head: Normocephalic and atraumatic.  Eyes: EOM are normal. Pupils are equal, round, and reactive to light.  Neck: Normal range of motion. Neck supple.  Cardiovascular: Normal rate and regular rhythm.   Respiratory: Effort normal and breath sounds normal.  GI: Soft. Bowel sounds are normal.  Musculoskeletal:       Right hip: He exhibits decreased range of motion, decreased strength, tenderness and bony tenderness.  Neurological: He is alert and oriented to person, place, and time.  Skin: Skin is warm.  Psychiatric: He has a normal mood and affect.    Vital signs in last 24 hours: Temp:  [98.2 F (36.8 C)] 98.2 F (36.8 C) (05/22 0950) Pulse Rate:  [74] 74 (05/22 0950) Resp:  [20] 20 (05/22 0950) BP: (164)/(85) 164/85 (05/22 0950) SpO2:  [100 %] 100 % (05/22 0950) Weight:  [140 lb (63.5 kg)] 140 lb (63.5 kg) (05/22 0938)  Labs:   Estimated body mass index is 19.8 kg/m as calculated from the following:   Height as of this encounter: 5' 10.5" (1.791 m).   Weight as of this encounter: 140 lb (63.5 kg).   Imaging Review Plain radiographs demonstrate AVN of the right hip with femoral head collapse  Assessment/Plan:  End stage avascular necrosis right hip(s)  The patient history, physical examination, clinical judgement of the provider and imaging studies are consistent with end stage AVN of the right hip(s) and total hip arthroplasty is deemed medically  necessary. The treatment options including medical management, injection therapy, arthroscopy and arthroplasty were discussed at length. The risks and benefits of total hip arthroplasty were presented and reviewed. The risks due to aseptic loosening, infection, stiffness, dislocation/subluxation,  thromboembolic complications and other imponderables were discussed.  The patient acknowledged the explanation, agreed to proceed with the plan and consent was signed. Patient is being admitted for inpatient treatment for surgery, pain control, PT, OT, prophylactic antibiotics, VTE prophylaxis, progressive ambulation and ADL's and discharge planning.The patient is planning to be discharged home with home health services

## 2016-12-13 NOTE — Transfer of Care (Signed)
Immediate Anesthesia Transfer of Care Note  Patient: Dennis Harris  Procedure(s) Performed: Procedure(s): RIGHT TOTAL HIP ARTHROPLASTY ANTERIOR APPROACH (Right)  Patient Location: PACU  Anesthesia Type:MAC and Spinal  Level of Consciousness: awake and alert   Airway & Oxygen Therapy: Patient Spontanous Breathing and Patient connected to face mask oxygen  Post-op Assessment: Report given to RN and Post -op Vital signs reviewed and stable  Post vital signs: Reviewed and stable  Last Vitals:  Vitals:   12/13/16 0950 12/13/16 1427  BP: (!) 164/85   Pulse: 74   Resp: 20   Temp: 36.8 C (P) 36.4 C    Last Pain:  Vitals:   12/13/16 1427  TempSrc:   PainSc: (P) 10-Worst pain ever      Patients Stated Pain Goal: 0 (28/76/81 1572)  Complications: No apparent anesthesia complications

## 2016-12-14 ENCOUNTER — Encounter (HOSPITAL_COMMUNITY): Payer: Self-pay | Admitting: Orthopaedic Surgery

## 2016-12-14 LAB — CBC
HCT: 34.4 % — ABNORMAL LOW (ref 39.0–52.0)
HEMOGLOBIN: 10.7 g/dL — AB (ref 13.0–17.0)
MCH: 24.7 pg — ABNORMAL LOW (ref 26.0–34.0)
MCHC: 31.1 g/dL (ref 30.0–36.0)
MCV: 79.3 fL (ref 78.0–100.0)
PLATELETS: 286 10*3/uL (ref 150–400)
RBC: 4.34 MIL/uL (ref 4.22–5.81)
RDW: 13.5 % (ref 11.5–15.5)
WBC: 9 10*3/uL (ref 4.0–10.5)

## 2016-12-14 LAB — BASIC METABOLIC PANEL
ANION GAP: 8 (ref 5–15)
BUN: 8 mg/dL (ref 6–20)
CHLORIDE: 100 mmol/L — AB (ref 101–111)
CO2: 26 mmol/L (ref 22–32)
Calcium: 8.9 mg/dL (ref 8.9–10.3)
Creatinine, Ser: 0.97 mg/dL (ref 0.61–1.24)
GFR calc Af Amer: >60 mL/min (ref >60)
Glucose, Bld: 104 mg/dL — ABNORMAL HIGH (ref 65–99)
POTASSIUM: 3.5 mmol/L (ref 3.5–5.1)
SODIUM: 134 mmol/L — AB (ref 135–145)

## 2016-12-14 MED ORDER — ENSURE ENLIVE PO LIQD
237.0000 mL | Freq: Three times a day (TID) | ORAL | Status: DC
  Administered 2016-12-14 – 2016-12-15 (×3): 237 mL via ORAL

## 2016-12-14 NOTE — Evaluation (Signed)
Occupational Therapy Evaluation Patient Details Name: Dennis Harris MRN: [id number] DOB: 1965-09-14 Today's Date: 12/14/2016    History of Present Illness 51 y.o. male admitted for right total hip arthroplasty.   Clinical Impression   PTA, pt was independent and living with his wife. Currently, pt requires Min A for LB ADLs and Min guard for transfers and functional mobility using RW. Pt would benefit from skilled OT to increase safety and independence with ADLs and address shower transfers. Recommend dc home once medically stable per physician.     Follow Up Recommendations  No OT follow up;Supervision/Assistance - 24 hour    Equipment Recommendations  3 in 1 bedside commode    Recommendations for Other Services       Precautions / Restrictions Restrictions Weight Bearing Restrictions: Yes RLE Weight Bearing: Weight bearing as tolerated      Mobility Bed Mobility               General bed mobility comments: Pt in recliner upon arrival  Transfers Overall transfer level: Needs assistance Equipment used: Rolling walker (2 wheeled) Transfers: Sit to/from Stand Sit to Stand: Min guard         General transfer comment: VCs for hand placement    Balance Overall balance assessment: Needs assistance Sitting-balance support: Feet supported;No upper extremity supported Sitting balance-Leahy Scale: Fair     Standing balance support: No upper extremity supported;During functional activity Standing balance-Leahy Scale: Fair Standing balance comment: Able to maintain standing balance at sink without UE support                           ADL either performed or assessed with clinical judgement   ADL Overall ADL's : Needs assistance/impaired Eating/Feeding: Set up;Sitting   Grooming: Oral care;Wash/dry hands;Supervision/safety;Standing;Min guard   Upper Body Bathing: Set up;Sitting   Lower Body Bathing: Min guard;Sit to/from stand   Upper Body  Dressing : Set up;Sitting   Lower Body Dressing: Sit to/from stand;Minimal assistance Lower Body Dressing Details (indicate cue type and reason): Pt dressed upon arrival and reports that his wife assisted. When asked to adjust socks, pt unable to reach down for R sock due to pain. Educated pt on donning pants on R leg first. Pt reports that wife will be present to A at [employer] Transfer: Min guard;Regular Toilet;Grab bars;RW;Ambulation Statistician Details (indicate cue type and reason): Educated pt technqiue for toilet t/f. Pt demonstrated good understanding.        Tub/Shower Transfer Details (indicate cue type and reason): Will need education on safe walk-in shower transfer technqniue Functional mobility during ADLs: Min guard;Rolling walker General ADL Comments: Pt performed ADLs in standing and fucntional mobility with Min guard.  Requires min A for LB ADLs     Vision         Perception     Praxis      Pertinent Vitals/Pain Pain Assessment: 0-10 Pain Score: 7  Pain Location: R hip Pain Descriptors / Indicators: Constant;Discomfort;Grimacing Pain Intervention(s): Monitored during session;Limited activity within patient's tolerance;Patient requesting pain meds-RN notified;Ice applied     Hand Dominance Right   Extremity/Trunk Assessment Upper Extremity Assessment Upper Extremity Assessment: Overall WFL for tasks assessed   Lower Extremity Assessment Lower Extremity Assessment: Defer to PT evaluation   Cervical / Trunk Assessment Cervical / Trunk Assessment: Normal   Communication Communication Communication: No difficulties   Cognition Arousal/Alertness: Awake/alert Behavior During Therapy: WFL for tasks assessed/performed Overall  Cognitive Status: Within Functional Limits for tasks assessed                                     General Comments       Exercises     Shoulder Instructions      Home Living Family/patient expects to be  discharged to:: Private residence Living Arrangements: Spouse/significant other;Children Available Help at Discharge: Family;Available 24 hours/day Type of Home: Apartment Home Access: Stairs to enter Entrance Stairs-Number of Steps: 5 Entrance Stairs-Rails: Right;Left Home Layout: One level     Bathroom Shower/Tub: Producer, television/film/video: Standard Bathroom Accessibility: Yes How Accessible: Accessible via walker Home Equipment: None          Prior Functioning/Environment Level of Independence: Independent                 OT Problem List: Decreased strength;Decreased range of motion;Decreased activity tolerance;Impaired balance (sitting and/or standing);Decreased safety awareness;Decreased knowledge of use of DME or AE;Decreased knowledge of precautions;Pain      OT Treatment/Interventions: Self-care/ADL training;Therapeutic exercise;Energy conservation;DME and/or AE instruction;Therapeutic activities;Patient/family education    OT Goals(Current goals can be found in the care plan section) Acute Rehab OT Goals Patient Stated Goal: Go home OT Goal Formulation: With patient Time For Goal Achievement: 12/28/16 Potential to Achieve Goals: Good ADL Goals Pt Will Perform Lower Body Dressing: with min guard assist;sit to/from stand Pt Will Perform Tub/Shower Transfer: Shower transfer;3 in 1;ambulating;with min guard assist;rolling walker  OT Frequency: Min 2X/week   Barriers to D/C:            Co-evaluation              AM-PAC PT "6 Clicks" Daily Activity     Outcome Measure Help from another person eating meals?: None Help from another person taking care of personal grooming?: A Little Help from another person toileting, which includes using toliet, bedpan, or urinal?: A Little Help from another person bathing (including washing, rinsing, drying)?: A Lot Help from another person to put on and taking off regular upper body clothing?: None Help from  another person to put on and taking off regular lower body clothing?: A Little 6 Click Score: 19   End of Session Equipment Utilized During Treatment: Rolling walker;Gait belt Nurse Communication: Mobility status;Patient requests pain meds  Activity Tolerance: Patient tolerated treatment well Patient left: in chair;with call bell/phone within reach  OT Visit Diagnosis: Unsteadiness on feet (R26.81);Muscle weakness (generalized) (M62.81);Other abnormalities of gait and mobility (R26.89);Pain Pain - Right/Left: Right Pain - part of body: Hip                Time: 0902-0918 OT Time Calculation (min): 16 min Charges:  OT General Charges $OT Visit: 1 Procedure OT Evaluation $OT Eval Low Complexity: 1 Procedure G-Codes:     Jedidiah Demartini, OTR/L [phone]  Theodoro Grist Kumari Sculley 12/14/2016, 9:34 AM

## 2016-12-14 NOTE — Care Management Note (Signed)
Case Management Note  Patient Details  Name: Dennis Harris MRN: [id number] Date of Birth: 07-15-1966  Subjective/Objective:    51 yr old male admitted with avascular necrosis of his right hip, underwent a right total hip arthroplasty on 12/13/16.              Action/Plan: Case manager spoke with patient and wife concerning discharge plan and DME needs. Patient was preoperatively setup with [facility], no changes. CM will request RW and 3in1 from [facility]. He will have family support at discharge.    Expected Discharge Date:    12/15/16              Expected Discharge Plan:  Home w Home Health Services  In-House Referral:  NA  Discharge planning Services  CM Consult  Post Acute Care Choice:  Durable Medical Equipment, Home Health Choice offered to:  Patient, Spouse  DME Arranged:  3-N-1, Walker rolling DME Agency:  [facility]  HH Arranged:  PT HH Agency:  [facility]  Status of Service:  Completed, signed off  If discussed at [employer], dates discussed:    Additional Comments:  Durenda Guthrie, RN 12/14/2016, 2:31 PM

## 2016-12-14 NOTE — Progress Notes (Signed)
Initial Nutrition Assessment  DOCUMENTATION CODES:   Severe malnutrition in context of acute illness/injury  INTERVENTION:  Provide Ensure Enlive po TID, each supplement provides 350 kcal and 20 grams of protein.  Encourage adequate PO intake.   NUTRITION DIAGNOSIS:   Malnutrition (severe) related to acute illness (avascular necrosis of R hip) as evidenced by percent weight loss, moderate depletions of muscle mass.  GOAL:   Patient will meet greater than or equal to 90% of their needs  MONITOR:   PO intake, Supplement acceptance, Labs, Weight trends, Skin, I & O's  REASON FOR ASSESSMENT:   Malnutrition Screening Tool    ASSESSMENT:   51 y.o. male, has a history of pain and functional disability in the right hip(s) due to avascular necrosis and patient has failed non-surgical conservative treatments for greater than 12 weeks. Pt with h/o AV of bilat hips, EF 40-45%, acute pericarditis, RA. PSH: ACDF and appendectomy.  PROCEDURE (5/22): RIGHT TOTAL HIP ARTHROPLASTY ANTERIOR APPROACH (Right)  Meal completion has been 10-20%. Pt reports having a decreased appetite which has been ongoing over the past 2-3 months. He reports however still consuming at least 2 meals a day PTA. Pt reports having a 10 lb weight loss over the past 2 months. Per weight records, pt with a 12.5% weight loss in 1 month. Pt currently has Ensure ordered and would like to continue with them. RD to increase supplements to TID to aid in adequate caloric and protein needs. Pt encouraged to continue supplementation post discharge especially if po intake is poor. Pt expressed understanding. Pt encouraged to eat at meals.   Nutrition-Focused physical exam completed. Findings are no fat depletion, mild to moderate muscle depletion, and mild edema.   Labs and medications reviewed.   Diet Order:  Diet regular Room service appropriate? Yes; Fluid consistency: Thin  Skin:   (Incision R hip)  Last BM:   5/22  Height:   Ht Readings from Last 1 Encounters:  12/13/16 5' 10.5" (1.791 m)    Weight:   Wt Readings from Last 1 Encounters:  12/13/16 140 lb (63.5 kg)    Ideal Body Weight:  76.8 kg  BMI:  Body mass index is 19.8 kg/m.  Estimated Nutritional Needs:   Kcal:  1900-2100  Protein:  90-100 grams  Fluid:  1.9 -2.1 L/day  EDUCATION NEEDS:   Education needs addressed  Roslyn Smiling, MS, RD, LDN Pager # [phone] After hours/ weekend pager # [phone]

## 2016-12-14 NOTE — Progress Notes (Signed)
Subjective: 1 Day Post-Op Procedure(s) (LRB): RIGHT TOTAL HIP ARTHROPLASTY ANTERIOR APPROACH (Right) Patient reports pain as moderate.    Objective: Vital signs in last 24 hours: Temp:  [97.5 F (36.4 C)-98.3 F (36.8 C)] 98 F (36.7 C) (05/23 0330) Pulse Rate:  [62-86] 85 (05/23 0330) Resp:  [12-20] 16 (05/23 0330) BP: (136-204)/(72-112) 136/76 (05/23 0330) SpO2:  [99 %-100 %] 100 % (05/23 0330) Weight:  [140 lb (63.5 kg)] 140 lb (63.5 kg) (05/22 0938)  Intake/Output from previous day: 05/22 0701 - 05/23 0700 In: 2887.5 [P.O.:600; I.V.:2287.5] Out: 2800 [Urine:2600; Blood:200] Intake/Output this shift: No intake/output data recorded.  No results for input(s): HGB in the last 72 hours. No results for input(s): WBC, RBC, HCT, PLT in the last 72 hours. No results for input(s): NA, K, CL, CO2, BUN, CREATININE, GLUCOSE, CALCIUM in the last 72 hours. No results for input(s): LABPT, INR in the last 72 hours.  Sensation intact distally Intact pulses distally Dorsiflexion/Plantar flexion intact Incision: dressing C/D/I  Assessment/Plan: 1 Day Post-Op Procedure(s) (LRB): RIGHT TOTAL HIP ARTHROPLASTY ANTERIOR APPROACH (Right) Up with therapy  Kathryne Hitch 12/14/2016, 7:02 AM

## 2016-12-14 NOTE — Progress Notes (Signed)
Physical Therapy Treatment Patient Details Name: Dennis Harris MRN: [id number] DOB: [birth year] Today's Date: 12/14/2016    History of Present Illness Pt is a 51 yo male admitted for R direct THA. Pt with h/o AV of bilat hips, EF 40-45%, acute pericarditis, RA. PSH: ACDF and appendectomy.    PT Comments    Pt slowly progressing. Limited by pain but demo'd increased R LE WBing and increased ambulation tolerance this date. Acute PT to con't to follow.   Follow Up Recommendations  Home health PT;Supervision - Intermittent     Equipment Recommendations  Rolling walker with 5" wheels    Recommendations for Other Services       Precautions / Restrictions Precautions Precautions: Fall Precaution Comments: no hip precautions Restrictions Weight Bearing Restrictions: Yes RLE Weight Bearing: Weight bearing as tolerated    Mobility  Bed Mobility Overal bed mobility: Needs Assistance Bed Mobility: Supine to Sit;Sit to Supine     Supine to sit: Supervision Sit to supine: Supervision   General bed mobility comments: pt used UEs to assist R LE on/off bed but no physical assist from PT required, increased time  Transfers Overall transfer level: Needs assistance Equipment used: Rolling walker (2 wheeled) Transfers: Sit to/from Stand Sit to Stand: Min guard         General transfer comment: limited R hip/knee flexion due to pain  Ambulation/Gait Ambulation/Gait assistance: Min guard Ambulation Distance (Feet): 200 Feet Assistive device: Rolling walker (2 wheeled) Gait Pattern/deviations: Step-through pattern Gait velocity: slow Gait velocity interpretation: Below normal speed for age/gender General Gait Details: pt initially step to gait pattern then transitioned to step through with decreased step length and increased R LE WBing through R LE compared to AM session   Stairs            Wheelchair Mobility    Modified Rankin (Stroke Patients Only)        Balance Overall balance assessment: Needs assistance Sitting-balance support: Feet supported Sitting balance-Leahy Scale: Fair     Standing balance support: Bilateral upper extremity supported Standing balance-Leahy Scale: Fair Standing balance comment: pt requires RW for safe amb                            Cognition Arousal/Alertness: Awake/alert Behavior During Therapy: WFL for tasks assessed/performed Overall Cognitive Status: Within Functional Limits for tasks assessed                                        Exercises Total Joint Exercises Quad Sets: AROM;Right;10 reps;Supine Heel Slides: AROM;AAROM;10 reps;Supine (AAROM and AROM 10x each) Goniometric ROM: active hip flexion to 30 deg, AA to 60 deg    General Comments        Pertinent Vitals/Pain Pain Assessment: 0-10 Pain Score: 8  Pain Location: R hip Pain Descriptors / Indicators: Constant Pain Intervention(s): Monitored during session    Home Living                      Prior Function            PT Goals (current goals can now be found in the care plan section) Acute Rehab PT Goals Patient Stated Goal: go home Progress towards PT goals: Progressing toward goals    Frequency    7X/week      PT Plan Current  plan remains appropriate    Co-evaluation              AM-PAC PT "6 Clicks" Daily Activity  Outcome Measure  Difficulty turning over in bed (including adjusting bedclothes, sheets and blankets)?: A Little Difficulty moving from lying on back to sitting on the side of the bed? : A Little Difficulty sitting down on and standing up from a chair with arms (e.g., wheelchair, bedside commode, etc,.)?: A Little Help needed moving to and from a bed to chair (including a wheelchair)?: A Little Help needed walking in hospital room?: A Little Help needed climbing 3-5 steps with a railing? : A Little 6 Click Score: 18    End of Session Equipment Utilized  During Treatment: Gait belt Activity Tolerance: Patient limited by pain Patient left: in bed;with call bell/phone within reach Nurse Communication: Mobility status PT Visit Diagnosis: Difficulty in walking, not elsewhere classified (R26.2);Pain Pain - Right/Left: Right Pain - part of body: Hip     Time: 3790-2409 PT Time Calculation (min) (ACUTE ONLY): 21 min  Charges:  $Gait Training: 8-22 mins                    G Codes:      Lewis Shock, PT, DPT Pager #: [phone] Office #: [phone]   Leightyn Cina M Lexander Tremblay 12/14/2016, 4:48 PM

## 2016-12-14 NOTE — Op Note (Signed)
NAME:  Dennis Harris, Dennis Harris NO.:  MEDICAL RECORD NO.:  [id number]  LOCATION:                                 FACILITY:  PHYSICIAN:  Vanita Panda. 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  PROCEDURE:  Right total hip arthroplasty through direct anterior approach.  IMPLANTS:  DePuy Sector Gription acetabular component size 56 with a single screw, size 36 +0 polyethylene liner, size 11 Corail femoral component with varus offset, size 36 -2 metal hip ball.  SURGEON:  Vanita Panda. Magnus Ivan, M.D.  ASSISTANT:  Dennis Canal, PA-C.  ANESTHESIA:  Spinal.  ANTIBIOTICS:  2 g of IV Ancef.  BLOOD LOSS:  200 mL.  COMPLICATIONS:  None.  INDICATIONS:  Dennis Harris is a very pleasant, 51 year old gentleman with bilateral hip avascular necrosis.  His pain has come on severely and rapidly.  He has x-rays that show actually subcortical irregularity consistent with a subcortical fracture and femoral head collapse.  He has gotten to where he is barely getting around due to the severity of his pain, he is having to use an assistive device.  It has definitely affected his activities of daily living, his quality of life and his mobility.  At this point, we have recommended a total hip arthroplasty. We feel this can be done through a direct anterior approach.  I explained in detail the risk of acute blood loss anemia, nerve and vessel injury, fracture, infection, dislocation, DVT.  He understands our goals are to decrease pain, improve mobility, and overall improve quality of life.  PROCEDURE DESCRIPTION:  After informed consent was obtained, appropriate right hip was marked.  He was  brought to the operating room, where spinal anesthesia was obtained while he was on his stretcher.  He was then laid in a supine position on a stretcher.  A Foley catheter was placed and both feet had traction boots applied to them.  Next, he was placed supine on the Hana fracture table with the perineal post in place and both legs in inline skeletal traction devices, but no traction applied.  His right operative hip was prepped and draped with DuraPrep and sterile drapes.  Time-out was called.  He was identified as correct patient and correct right hip.  I then made an incision just inferior and posterior to the anterior superior iliac spine and carried this obliquely down the leg.  I dissected down the tensor fascia lata muscle and the tensor fascia was then divided longitudinally to proceed with a direct anterior approach to the hip.  We identified and cauterized the circumflex vessels and identified the hip capsule.  I opened up the hip capsule in an L-type format, finding a very large joint effusion.  We then placed Cobra retractors around the medial and lateral femoral neck and made our femoral neck cut proximal to the lesser trochanter with an oscillating saw and completed this with an osteotome.  I placed  a corkscrew guide in the femoral head and removed the femoral head in its entirety and found a wide area where the cartilage was flaking off.  We then placed a bent Hohmann over the medial acetabular rim and began reaming under direct visualization from a size 42 reamer, going up to a size 56.  We also cleaned remnants of the acetabular labrum.  With a size 56 in place with a reamer, we placed this last reamer under direct fluoroscopy, so we could obtain our depth of reaming, our inclination, and anteversion.  Once we were pleased with this, we placed the real DePuy Sector Gription acetabular component size 56 and a single screw. We placed a 36 +0 neutral polyethylene liner for  that size acetabular component.  Attention was then turned to the femur.  With the leg externally rotated to 120 degrees, extended and adducted, we were able to place a Mueller retractor medially and a Hohmann retractor behind the greater trochanter.  I released the lateral joint capsule and used a box cutting osteotome in the inner femoral Harris and a rongeur to lateralize.  We then began broaching from a size 8 broach using the Corail broaching system, going up to a size 11.  Based off the need for offset, we trialed a varus offset femoral neck and we went with the 36 - 2 hip ball first because I felt that he would be contracted and hard to reduce.  We were able to reduce this in the acetabulum and we were actually pleased with leg length, offset, and range of motion as well as assessment of stability under direct fluoroscopy as well.  We then dislocated the hip and removed the trial components.  We were able to place a real Corail femoral component with varus offset size 11 and a real 36 -2 metal hip ball, reduced this in the acetabulum and again it was stable.  We then irrigated the soft tissue with normal saline solution using pulsatile lavage.  We were able to close the joint capsule with interrupted #1 Ethibond suture followed by running #1 Vicryl in the tensor fascia, 0 Vicryl in the deep tissue, 2-0 Vicryl in the subcutaneous tissue, 4-0 Monocryl subcuticular stitch and Steri- Strips on the skin.  An Aquacel dressing was applied.  He was taken to the recovery room in stable condition.  All final counts were correct. There were no complications noted.  Of note, Dennis Canal, PA-C, assisted in the entire case, his assistance was crucial for facilitating all aspects of this case.     Vanita Panda. Magnus Ivan, M.D.     CYB/MEDQ  D:  12/13/2016  T:  12/13/2016  Job:  [id number]

## 2016-12-14 NOTE — Evaluation (Signed)
Physical Therapy Evaluation Patient Details Name: Dennis Harris MRN: [id number] DOB: 08-17-1965 Today's Date: 12/14/2016   History of Present Illness  Pt is a 51 yo male admitted for R direct THA. Pt with h/o AV of bilat hips, EF 40-45%, acute pericarditis, RA. PSH: ACDF and appendectomy.  Clinical Impression  Pt mobility greatly limited by 10/10 R LE pain. Pt with minimal R LE WBing. Pt will need to be mod I functioning and be able to safely navigate stairs prior to d/c home due to pt with no support during the day.    Follow Up Recommendations Home health PT;Supervision - Intermittent    Equipment Recommendations  Rolling walker with 5" wheels    Recommendations for Other Services       Precautions / Restrictions Precautions Precautions: Fall Precaution Comments: no hip precuation, limited by pain Restrictions Weight Bearing Restrictions: Yes RLE Weight Bearing: Weight bearing as tolerated      Mobility  Bed Mobility Overal bed mobility: Needs Assistance Bed Mobility: Sit to Supine       Sit to supine: Min assist   General bed mobility comments: pt unable to manage R LE, minA to bring leg back into bed  Transfers Overall transfer level: Needs assistance Equipment used: Rolling walker (2 wheeled) Transfers: Sit to/from Stand Sit to Stand: Min guard         General transfer comment: VCs for hand placement  Ambulation/Gait Ambulation/Gait assistance: Min guard Ambulation Distance (Feet): 100 Feet Assistive device: Rolling walker (2 wheeled) Gait Pattern/deviations: Step-to pattern;Decreased stride length;Decreased stance time - right;Antalgic Gait velocity: slow Gait velocity interpretation: Below normal speed for age/gender General Gait Details: pt initially only WBing on ball of R foot. Pt progressed with cuing to full foot R LE WBing however less than 25%. pt very dependent on UEs due to 10/10 pain  Stairs            Wheelchair Mobility     Modified Rankin (Stroke Patients Only)       Balance Overall balance assessment: Needs assistance Sitting-balance support: Feet supported;No upper extremity supported Sitting balance-Leahy Scale: Fair     Standing balance support: Bilateral upper extremity supported Standing balance-Leahy Scale: Fair Standing balance comment: pt requires RW for safe amb                             Pertinent Vitals/Pain Pain Assessment: 0-10 Pain Score: 10-Worst pain ever Pain Location: R hip Pain Descriptors / Indicators: Constant;Discomfort;Grimacing Pain Intervention(s): Monitored during session    Home Living Family/patient expects to be discharged to:: Private residence Living Arrangements: Children (dtr) Available Help at Discharge: Family;Available PRN/intermittently Type of Home: Apartment Home Access: Stairs to enter Entrance Stairs-Rails: Doctor, general practice of Steps: 5 Home Layout: One level Home Equipment: Cane - single point      Prior Function Level of Independence: Independent         Comments: was using cane PTA     Hand Dominance   Dominant Hand: Right    Extremity/Trunk Assessment   Upper Extremity Assessment Upper Extremity Assessment: Defer to OT evaluation    Lower Extremity Assessment Lower Extremity Assessment: RLE deficits/detail RLE Deficits / Details: pt very limited by pain, active assisted hip and knee ROM to 60 deg    Cervical / Trunk Assessment Cervical / Trunk Assessment: Normal  Communication   Communication: No difficulties  Cognition Arousal/Alertness: Awake/alert Behavior During Therapy: WFL for tasks  assessed/performed Overall Cognitive Status: Within Functional Limits for tasks assessed                                        General Comments General comments (skin integrity, edema, etc.): dressing intact    Exercises     Assessment/Plan    PT Assessment Patient needs continued  PT services  PT Problem List Decreased strength;Decreased range of motion;Decreased activity tolerance;Decreased balance;Decreased mobility;Decreased knowledge of use of DME;Pain       PT Treatment Interventions DME instruction;Gait training;Stair training;Functional mobility training;Therapeutic activities;Therapeutic exercise;Balance training    PT Goals (Current goals can be found in the Care Plan section)  Acute Rehab PT Goals Patient Stated Goal: Go home PT Goal Formulation: With patient Time For Goal Achievement: 12/21/16 Potential to Achieve Goals: Good    Frequency 7X/week   Barriers to discharge Decreased caregiver support pt alone during the day    Co-evaluation               AM-PAC PT "6 Clicks" Daily Activity  Outcome Measure Difficulty turning over in bed (including adjusting bedclothes, sheets and blankets)?: A Little Difficulty moving from lying on back to sitting on the side of the bed? : A Little Difficulty sitting down on and standing up from a chair with arms (e.g., wheelchair, bedside commode, etc,.)?: A Little Help needed moving to and from a bed to chair (including a wheelchair)?: A Little Help needed walking in hospital room?: A Little Help needed climbing 3-5 steps with a railing? : A Little 6 Click Score: 18    End of Session Equipment Utilized During Treatment: Gait belt Activity Tolerance: Patient limited by pain Patient left: in bed;with call bell/phone within reach Nurse Communication: Mobility status PT Visit Diagnosis: Difficulty in walking, not elsewhere classified (R26.2);Pain Pain - Right/Left: Right Pain - part of body: Hip    Time: 1031-1057 PT Time Calculation (min) (ACUTE ONLY): 26 min   Charges:   PT Evaluation $PT Eval Moderate Complexity: 1 Procedure PT Treatments $Gait Training: 8-22 mins   PT G Codes:        Lewis Shock, PT, DPT Pager #: [phone] Office #: [phone]   Laurice Kimmons M Najae Rathert 12/14/2016, 11:53  AM

## 2016-12-15 ENCOUNTER — Telehealth (INDEPENDENT_AMBULATORY_CARE_PROVIDER_SITE_OTHER): Payer: Self-pay | Admitting: Orthopaedic Surgery

## 2016-12-15 LAB — CBC
HCT: 28.6 % — ABNORMAL LOW (ref 39.0–52.0)
Hemoglobin: 8.9 g/dL — ABNORMAL LOW (ref 13.0–17.0)
MCH: 24.6 pg — ABNORMAL LOW (ref 26.0–34.0)
MCHC: 31.1 g/dL (ref 30.0–36.0)
MCV: 79 fL (ref 78.0–100.0)
PLATELETS: 263 10*3/uL (ref 150–400)
RBC: 3.62 MIL/uL — AB (ref 4.22–5.81)
RDW: 13.3 % (ref 11.5–15.5)
WBC: 9.6 10*3/uL (ref 4.0–10.5)

## 2016-12-15 MED ORDER — ASPIRIN 81 MG PO CHEW: 81.0000 mg | CHEWABLE_TABLET | Freq: Two times a day (BID) | ORAL | 0 refills | Status: DC

## 2016-12-15 MED ORDER — METHOCARBAMOL 500 MG PO TABS: 500.0000 mg | ORAL_TABLET | Freq: Four times a day (QID) | ORAL | 1 refills | Status: DC | PRN

## 2016-12-15 MED ORDER — OXYCODONE-ACETAMINOPHEN 5-325 MG PO TABS: 1.0000 | ORAL_TABLET | ORAL | 0 refills | Status: DC | PRN

## 2016-12-15 NOTE — Progress Notes (Signed)
Physical Therapy Treatment Patient Details Name: Dennis Harris MRN: [id number] DOB: 1965/08/17 Today's Date: 12/15/2016    History of Present Illness Pt is a 51 yo male admitted for R direct THA. Pt with h/o AV of bilat hips, EF 40-45%, acute pericarditis, RA. PSH: ACDF and appendectomy.    PT Comments    Pt reported he was feeling worse today than yesterday. RN present and administering pain meds at beginning of session. Reviewed supine HEP with pt and ambulated 300 ft with min guard. Pt moving well. Plan to teach remaining seated HEP and stairs this PM before pt d/c home.     Follow Up Recommendations  Home health PT;Supervision - Intermittent     Equipment Recommendations  Rolling walker with 5" wheels    Recommendations for Other Services       Precautions / Restrictions Precautions Precautions: Fall Precaution Comments: no hip precautions Restrictions Weight Bearing Restrictions: Yes RLE Weight Bearing: Weight bearing as tolerated    Mobility  Bed Mobility               General bed mobility comments: in chair on arrival  Transfers Overall transfer level: Needs assistance Equipment used: Rolling walker (2 wheeled) Transfers: Sit to/from Stand Sit to Stand: Min guard         General transfer comment: min guard for safety, and cueing for walker placement when sitting.  Ambulation/Gait Ambulation/Gait assistance: Min guard Ambulation Distance (Feet): 300 Feet Assistive device: Rolling walker (2 wheeled) Gait Pattern/deviations: Step-through pattern Gait velocity: slow Gait velocity interpretation: Below normal speed for age/gender General Gait Details: Overall steady gait, cueing required for postural control.   Stairs            Wheelchair Mobility    Modified Rankin (Stroke Patients Only)       Balance Overall balance assessment: Needs assistance Sitting-balance support: Feet supported Sitting balance-Leahy Scale: Good      Standing balance support: Bilateral upper extremity supported Standing balance-Leahy Scale: Fair Standing balance comment: pt requires RW for safe amb                            Cognition Arousal/Alertness: Awake/alert Behavior During Therapy: WFL for tasks assessed/performed Overall Cognitive Status: Within Functional Limits for tasks assessed                                        Exercises Total Joint Exercises Ankle Circles/Pumps: AROM;Both;10 reps;Seated Quad Sets: AROM;Right;10 reps;Seated Heel Slides: AAROM;Right;10 reps;Seated Hip ABduction/ADduction: AAROM;Right;10 reps;Seated    General Comments        Pertinent Vitals/Pain Pain Assessment: 0-10 Pain Score: 8  Pain Location: R hip Pain Descriptors / Indicators: Constant Pain Intervention(s): Limited activity within patient's tolerance;Monitored during session;Repositioned;Ice applied;RN gave pain meds during session    Home Living                      Prior Function            PT Goals (current goals can now be found in the care plan section) Acute Rehab PT Goals Patient Stated Goal: go home PT Goal Formulation: With patient Time For Goal Achievement: 12/21/16 Potential to Achieve Goals: Good Progress towards PT goals: Progressing toward goals    Frequency    7X/week      PT Plan  Current plan remains appropriate    Co-evaluation              AM-PAC PT "6 Clicks" Daily Activity  Outcome Measure  Difficulty turning over in bed (including adjusting bedclothes, sheets and blankets)?: A Little Difficulty moving from lying on back to sitting on the side of the bed? : A Little Difficulty sitting down on and standing up from a chair with arms (e.g., wheelchair, bedside commode, etc,.)?: A Little Help needed moving to and from a bed to chair (including a wheelchair)?: A Little Help needed walking in hospital room?: A Little Help needed climbing 3-5 steps with  a railing? : A Little 6 Click Score: 18    End of Session Equipment Utilized During Treatment: Gait belt Activity Tolerance: Patient limited by pain Patient left: in chair;with call bell/phone within reach Nurse Communication: Mobility status PT Visit Diagnosis: Difficulty in walking, not elsewhere classified (R26.2);Pain Pain - Right/Left: Right Pain - part of body: Hip     Time: 8527-7824 PT Time Calculation (min) (ACUTE ONLY): 20 min  Charges:  $Gait Training: 8-22 mins                    G Codes:       Kallie Locks, PTA Acute Rehab [phone]   Sheral Apley 12/15/2016, 10:12 AM

## 2016-12-15 NOTE — Telephone Encounter (Signed)
Patient called advised Dr Magnus Ivan wrote him a Rx for Oxycodone.  Patient said he had got a Rx for Hydrocodone last Wednesday 12/07/16. The pharmacy asked for a call so that the Rx for Oxycodone can be filled. Patient said he uses the pharmacy on [address]. The number to contact patient is [phone]

## 2016-12-15 NOTE — Care Management (Signed)
Case manager was informed by Ayesha Rumpf, [facility] that [facility] is not able to provide services to patient due to lack of insurance. CM contacted Janeice Robinson, [facility] with referral.

## 2016-12-15 NOTE — Discharge Instructions (Signed)

## 2016-12-15 NOTE — Discharge Summary (Signed)
Patient ID: Dennis Harris MRN: [id number] DOB/AGE: 1965-10-16 51 y.o.  Admit date: 12/13/2016 Discharge date: 12/15/2016  Admission Diagnoses:  Principal Problem:   Avascular necrosis of hip, right (HCC) Active Problems:   Status post total replacement of right hip   Discharge Diagnoses:  Same  Past Medical History:  Diagnosis Date  . 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 Exertional shortness of breath    "at times" (11/14/2012)  . Hypercholesteremia   . Hypertension   . Pericarditis ~ 2009  . Pneumonia   . Rheumatoid arthritis(714.0)     Surgeries: Procedure(s): RIGHT TOTAL HIP ARTHROPLASTY ANTERIOR APPROACH on 12/13/2016   Consultants:   Discharged Condition: Improved  Hospital Course: ROMEN YUTZY is an 51 y.o. male who was admitted 12/13/2016 for operative treatment ofAvascular necrosis of hip, right (HCC). Patient has severe unremitting pain that affects sleep, daily activities, and work/hobbies. After pre-op clearance the patient was taken to the operating room on 12/13/2016 and underwent  Procedure(s): RIGHT TOTAL HIP ARTHROPLASTY ANTERIOR APPROACH.    Patient was given perioperative antibiotics: Anti-infectives    Start     Dose/Rate Route Frequency Ordered Stop   12/13/16 1930  ceFAZolin (ANCEF) IVPB 1 g/50 mL premix     1 g 100 mL/hr over 30 Minutes Intravenous Every 6 hours 12/13/16 1725 12/14/16 0255   12/13/16 0929  ceFAZolin (ANCEF) IVPB 2g/100 mL premix     2 g 200 mL/hr over 30 Minutes Intravenous On call to O.R. 12/13/16 0929 12/13/16 1240   12/13/16 0926  ceFAZolin (ANCEF) 2-4 GM/100ML-% IVPB    Comments:  Posey Pronto   : cabinet override      12/13/16 0926 12/13/16 1240       Patient was given sequential compression devices, early ambulation, and chemoprophylaxis to prevent DVT.  Patient benefited maximally from hospital stay and there were no complications.    Recent vital signs: Patient Vitals for the past 24 hrs:  BP Temp Temp src Pulse Resp SpO2  12/15/16 0630 107/63 98.7 F (37.1 C) Oral 94 18 100 %  12/14/16 2230 - 98.1 F (36.7 C) - - - -  12/14/16 2100 122/62 (!) 100.7 F (38.2 C) Oral (!) 101 17 100 %  12/14/16 1300 138/69 98.4 F (36.9 C) Oral 97 16 100 %     Recent laboratory studies:  Recent Labs  12/14/16 0757 12/15/16 0351  WBC 9.0 9.6  HGB 10.7* 8.9*  HCT 34.4* 28.6*  PLT 286 263  NA 134*  --   K 3.5  --   CL 100*  --   CO2 26  --   BUN 8  --   CREATININE 0.97  --   GLUCOSE 104*  --   CALCIUM 8.9  --      Discharge Medications:   Allergies as of 12/15/2016      Reactions   Bee Venom Anaphylaxis   Shellfish Allergy Anaphylaxis, Hives      Medication List    STOP taking these medications   HYDROcodone-acetaminophen 5-325 MG tablet Commonly known as:  NORCO/VICODIN   ibuprofen 200 MG tablet Commonly known as:  ADVIL,MOTRIN   meloxicam 7.5 MG tablet Commonly known as:  MOBIC   traMADol 50 MG tablet Commonly known as:  ULTRAM     TAKE these medications   aspirin 81 MG chewable tablet Chew 1 tablet (81 mg total) by mouth 2 (two) times daily.   Fish Oil 1000  MG Caps Take 1,000 mg by mouth daily.   HYDROCORTISONE EX Apply 1 application topically 2 (two) times daily as needed (for razor bumps/rash/itching.).   losartan-hydrochlorothiazide 50-12.5 MG tablet Commonly known as:  HYZAAR Take 2 tablets by mouth daily.   methocarbamol 500 MG tablet Commonly known as:  ROBAXIN Take 1 tablet (500 mg total) by mouth every 6 (six) hours as needed for muscle spasms.   oxyCODONE-acetaminophen 5-325 MG tablet Commonly known as:  ROXICET Take 1-2 tablets by mouth every 4 (four) hours as needed for severe pain.            Durable Medical Equipment        Start     Ordered   12/13/16 1726  DME 3 n 1  Once     12/13/16 1725   12/13/16 1726  DME Walker rolling  Once    Question:  Patient needs a walker to treat with the following condition  Answer:   Status post total replacement of right hip   12/13/16 1725      Diagnostic Studies: Dg Pelvis Portable  Result Date: 12/13/2016 CLINICAL DATA:  51 year old male post hip replacement. Subsequent encounter. EXAM: PORTABLE PELVIS 1-2 VIEWS COMPARISON:  Intraoperative C-arm view same date. FINDINGS: Post total right hip replacement which appears in satisfactory position without complication on frontal projection. Left femoral head avascular necrosis suspected. IMPRESSION: Post total right hip replacement which appears in satisfactory position without complication on frontal projection. Left femoral head avascular necrosis suspected. Electronically Signed   By: Lacy Duverney M.D.   On: 12/13/2016 16:36   Dg C-arm 1-60 Min  Result Date: 12/13/2016 CLINICAL DATA:  Right hip replacement. EXAM: OPERATIVE RIGHT HIP (WITH PELVIS IF PERFORMED)  VIEWS TECHNIQUE: Fluoroscopic spot image(s) were submitted for interpretation post-operatively. COMPARISON:  06/26/2016. FINDINGS: Total right hip replacement. Hardware intact. Anatomic alignment noted . One image obtained. 0 minutes 33 seconds fluoroscopy time . IMPRESSION: Total right hip replacement. Electronically Signed   By: Maisie Fus  Register   On: 12/13/2016 15:29   Dg Hip Operative Unilat W Or W/o Pelvis Right  Result Date: 12/13/2016 CLINICAL DATA:  Right hip replacement. EXAM: OPERATIVE RIGHT HIP (WITH PELVIS IF PERFORMED)  VIEWS TECHNIQUE: Fluoroscopic spot image(s) were submitted for interpretation post-operatively. COMPARISON:  06/26/2016. FINDINGS: Total right hip replacement. Hardware intact. Anatomic alignment noted . One image obtained. 0 minutes 33 seconds fluoroscopy time . IMPRESSION: Total right hip replacement. Electronically Signed   By: Maisie Fus  Register   On: 12/13/2016 15:29    Disposition: 01-Home or Self Care    Follow-up Information    [facility] Follow up.   Specialty:  Home Health Services Why:  A representative from [facility] will contact you to arrange start date and time for your therapy. Contact information: [address] [place] [address] [phone]        Kathryne Hitch, MD. Schedule an appointment as soon as possible for a visit in 2 week(s).   Specialty:  Orthopedic Surgery Contact information: [address] [place] [address] [phone]            Signed: Richardean Canal 12/15/2016, 10:28 AM

## 2016-12-15 NOTE — Progress Notes (Signed)
Subjective: 2 Days Post-Op Procedure(s) (LRB): RIGHT TOTAL HIP ARTHROPLASTY ANTERIOR APPROACH (Right) Patient reports pain as moderate.  No chest pain, SOB, nausea or vomiting.   Objective: Vital signs in last 24 hours: Temp:  [98.1 F (36.7 C)-100.7 F (38.2 C)] 98.7 F (37.1 C) (05/24 0630) Pulse Rate:  [94-101] 94 (05/24 0630) Resp:  [16-18] 18 (05/24 0630) BP: (107-138)/(62-69) 107/63 (05/24 0630) SpO2:  [100 %] 100 % (05/24 0630)  Intake/Output from previous day: 05/23 0701 - 05/24 0700 In: 1020 [P.O.:1020] Out: -  Intake/Output this shift: No intake/output data recorded.   Recent Labs  12/14/16 0757 12/15/16 0351  HGB 10.7* 8.9*    Recent Labs  12/14/16 0757 12/15/16 0351  WBC 9.0 9.6  RBC 4.34 3.62*  HCT 34.4* 28.6*  PLT 286 263    Recent Labs  12/14/16 0757  NA 134*  K 3.5  CL 100*  CO2 26  BUN 8  CREATININE 0.97  GLUCOSE 104*  CALCIUM 8.9   No results for input(s): LABPT, INR in the last 72 hours.  Dorsiflexion/Plantar flexion intact Incision: dressing C/D/I Compartment soft  Assessment/Plan: 2 Days Post-Op Procedure(s) (LRB): RIGHT TOTAL HIP ARTHROPLASTY ANTERIOR APPROACH (Right) Up with therapy  Plan discharge to home if patient remains stable and does well with PT  GILBERT CLARK 12/15/2016, 10:22 AM

## 2016-12-15 NOTE — Progress Notes (Signed)
Occupational Therapy Treatment Patient Details Name: Dennis Harris MRN: [id number] DOB: 27-Sep-1965 Today's Date: 12/15/2016    History of present illness Pt is a 51 yo male admitted for R direct THA. Pt with h/o AV of bilat hips, EF 40-45%, acute pericarditis, RA. PSH: ACDF and appendectomy.   OT comments  Pt demonstrating progress towards established goals. Provided education on shower transfer with 3N1 and demonstrated understanding. Pt performed transfer with Min guard A. Reviewed dressing techniques and pt verbalized understanding. Provided education and answered pt's questions. Continue to recommend dc home once medically stable. All acute OT needs met. Will sign off.    Follow Up Recommendations  No OT follow up;Supervision/Assistance - 24 hour    Equipment Recommendations  3 in 1 bedside commode    Recommendations for Other Services      Precautions / Restrictions Precautions Precautions: Fall Precaution Comments: no hip precautions Restrictions Weight Bearing Restrictions: Yes RLE Weight Bearing: Weight bearing as tolerated       Mobility Bed Mobility               General bed mobility comments: in chair on arrival  Transfers Overall transfer level: Needs assistance Equipment used: Rolling walker (2 wheeled) Transfers: Sit to/from Stand Sit to Stand: Min guard         General transfer comment: min guard for safety, and cueing for walker placement when sitting.    Balance Overall balance assessment: Needs assistance Sitting-balance support: Feet supported Sitting balance-Leahy Scale: Good     Standing balance support: Bilateral upper extremity supported Standing balance-Leahy Scale: Fair Standing balance comment: pt requires RW for safe amb                           ADL either performed or assessed with clinical judgement   ADL Overall ADL's : Needs assistance/impaired                                 Tub/ Shower  Transfer: Walk-in shower;Min guard;Ambulation;3 in 1;Rolling walker Tub/Shower Transfer Details (indicate cue type and reason): Provided education on shower transfer. Pt demonstrated understanding and performed transfer with Min gaurd for safety Functional mobility during ADLs: Min guard;Rolling walker General ADL Comments: Pt demonstrating progress     Vision       Perception     Praxis      Cognition Arousal/Alertness: Awake/alert Behavior During Therapy: WFL for tasks assessed/performed Overall Cognitive Status: Within Functional Limits for tasks assessed                                         Exercises    Shoulder Instructions       General Comments Provided needed education and answered pt questions. Pt desires to dc today. Feel he is progressing well.     Pertinent Vitals/ Pain       Pain Assessment: 0-10 Pain Score: 8  Pain Location: R hip Pain Descriptors / Indicators: Constant Pain Intervention(s): Monitored during session;Ice applied  Home Living                                          Prior Functioning/Environment  Frequency  Min 2X/week        Progress Toward Goals  OT Goals(current goals can now be found in the care plan section)  Progress towards OT goals: Progressing toward goals  Acute Rehab OT Goals Patient Stated Goal: go home OT Goal Formulation: With patient Time For Goal Achievement: 12/28/16 Potential to Achieve Goals: Good ADL Goals Pt Will Perform Lower Body Dressing: with min guard assist;sit to/from stand Pt Will Perform Tub/Shower Transfer: Shower transfer;3 in 1;ambulating;with min guard assist;rolling walker  Plan Discharge plan remains appropriate    Co-evaluation                 AM-PAC PT "6 Clicks" Daily Activity     Outcome Measure   Help from another person eating meals?: None Help from another person taking care of personal grooming?: A Little Help from  another person toileting, which includes using toliet, bedpan, or urinal?: A Little Help from another person bathing (including washing, rinsing, drying)?: A Lot Help from another person to put on and taking off regular upper body clothing?: None Help from another person to put on and taking off regular lower body clothing?: A Little 6 Click Score: 19    End of Session Equipment Utilized During Treatment: Rolling walker  OT Visit Diagnosis: Unsteadiness on feet (R26.81);Muscle weakness (generalized) (M62.81);Other abnormalities of gait and mobility (R26.89);Pain Pain - Right/Left: Right Pain - part of body: Hip   Activity Tolerance Patient tolerated treatment well   Patient Left in chair;with call bell/phone within reach   Nurse Communication Mobility status        Time: 3543-0148 OT Time Calculation (min): 12 min  Charges: OT General Charges $OT Visit: 1 Procedure OT Treatments $Self Care/Home Management : 8-22 mins  [place], OTR/L [place] 12/15/2016, 11:14 AM

## 2016-12-16 ENCOUNTER — Telehealth (INDEPENDENT_AMBULATORY_CARE_PROVIDER_SITE_OTHER): Payer: Self-pay

## 2016-12-16 NOTE — Telephone Encounter (Signed)
Threasa Alpha P.T. With [facility] called requesting verbal orders for HHPT 3x/wk for 1 wk then 2x/wk for 3 wks. This was given.

## 2016-12-16 NOTE — Telephone Encounter (Signed)
Received call from patient stating he was d/c from hospital and took rx's by pharmacy and was told that could not be filled b/c had norco 10 days ago. I called s/w pharmacist and advised patient just had surgery and that rx's needed to be filled. She will take care of this and patient is aware that he should be able to get pain meds filled now.

## 2016-12-20 ENCOUNTER — Encounter: Payer: Self-pay | Admitting: Gastroenterology

## 2016-12-20 NOTE — Telephone Encounter (Signed)
Called and ok'd this message on pharmacy voicemail

## 2016-12-23 ENCOUNTER — Other Ambulatory Visit (INDEPENDENT_AMBULATORY_CARE_PROVIDER_SITE_OTHER): Payer: Self-pay

## 2016-12-23 ENCOUNTER — Telehealth (INDEPENDENT_AMBULATORY_CARE_PROVIDER_SITE_OTHER): Payer: Self-pay | Admitting: Radiology

## 2016-12-23 MED ORDER — TRAMADOL HCL 50 MG PO TABS: 50.0000 mg | ORAL_TABLET | Freq: Four times a day (QID) | ORAL | 0 refills | Status: DC | PRN

## 2016-12-23 NOTE — Telephone Encounter (Signed)
Patient is not taking Oxycodone he has swtichwd to Ibuprofen 200mg  1- pills q 6 hrs. Level # interaction as far as his medications, any recommendations for his care please call Jim back.

## 2016-12-23 NOTE — Telephone Encounter (Signed)
Rosanne Ashing wanted me to let you all know that it was a Level 3 interaction.  He said the patient has d/c'd the oxycodone and is now down to just taking the Ibuprofen 1-2 pills every 6 hours.

## 2016-12-23 NOTE — Telephone Encounter (Signed)
See below, please advise.

## 2016-12-23 NOTE — Telephone Encounter (Signed)
Called into pharmacy, patient aware 

## 2016-12-23 NOTE — Telephone Encounter (Signed)
See if he would like to try tramadol.  If so, then call in 50 mg, 1-2 every 6 hours as needed, #60. Thanks

## 2016-12-23 NOTE — Telephone Encounter (Signed)
He was having interaction with oxycodone?

## 2016-12-27 ENCOUNTER — Ambulatory Visit (INDEPENDENT_AMBULATORY_CARE_PROVIDER_SITE_OTHER): Payer: Self-pay | Admitting: Orthopaedic Surgery

## 2016-12-27 DIAGNOSIS — Z96641 Presence of right artificial hip joint: Secondary | ICD-10-CM

## 2016-12-27 MED ORDER — OXYCODONE-ACETAMINOPHEN 5-325 MG PO TABS: 1.0000 | ORAL_TABLET | Freq: Four times a day (QID) | ORAL | 0 refills | Status: DC | PRN

## 2016-12-27 NOTE — Progress Notes (Signed)
The patient is 2 weeks status post a right total hip arthroplasty due to severe avascular necrosis. He is doing well. Exam of the cane. He has notes from home therapy stating he is doing well. He's been on aspirin twice a day as well.  On examination his leg lengths are equal. He has just a mild seroma of his right hip area but no significant amount to drain. His incision looks really good replacement knee Steri-Strips.  All questions were encouraged and answered. We'll have him stop his daily aspirin and we will refill his Percocet one more time. After this will transition asked repair of it hurts him to take 600 800 mg of ibuprofen 2 times a day with meals for the next week as well. We'll see him back in 4 weeks to see how is doing overall but no x-rays are needed.

## 2017-01-16 ENCOUNTER — Other Ambulatory Visit (INDEPENDENT_AMBULATORY_CARE_PROVIDER_SITE_OTHER): Payer: Self-pay | Admitting: Orthopaedic Surgery

## 2017-01-16 ENCOUNTER — Telehealth (INDEPENDENT_AMBULATORY_CARE_PROVIDER_SITE_OTHER): Payer: Self-pay

## 2017-01-16 MED ORDER — OXYCODONE-ACETAMINOPHEN 5-325 MG PO TABS: 1.0000 | ORAL_TABLET | Freq: Three times a day (TID) | ORAL | 0 refills | Status: DC | PRN

## 2017-01-16 NOTE — Telephone Encounter (Signed)
Patient aware Rx ready at front desk  

## 2017-01-16 NOTE — Telephone Encounter (Signed)
Patient would like a Rx refill for Oxycodone or a Rx for pain.  Cb# [phone].  Please Advise. Thank You.

## 2017-01-16 NOTE — Telephone Encounter (Signed)
Please advise 

## 2017-01-16 NOTE — Telephone Encounter (Signed)
Can come and pick up script 

## 2017-01-30 ENCOUNTER — Encounter (INDEPENDENT_AMBULATORY_CARE_PROVIDER_SITE_OTHER): Payer: Self-pay | Admitting: Physician Assistant

## 2017-01-30 ENCOUNTER — Emergency Department (HOSPITAL_COMMUNITY): Payer: Self-pay

## 2017-01-30 ENCOUNTER — Ambulatory Visit (INDEPENDENT_AMBULATORY_CARE_PROVIDER_SITE_OTHER): Payer: Self-pay | Admitting: Physician Assistant

## 2017-01-30 ENCOUNTER — Encounter (HOSPITAL_COMMUNITY): Payer: Self-pay

## 2017-01-30 VITALS — BP 164/82 | HR 86 | Temp 98.2°F | Wt 144.4 lb

## 2017-01-30 DIAGNOSIS — R079 Chest pain, unspecified: Secondary | ICD-10-CM | POA: Insufficient documentation

## 2017-01-30 DIAGNOSIS — Z5321 Procedure and treatment not carried out due to patient leaving prior to being seen by health care provider: Secondary | ICD-10-CM | POA: Insufficient documentation

## 2017-01-30 DIAGNOSIS — Z96641 Presence of right artificial hip joint: Secondary | ICD-10-CM

## 2017-01-30 DIAGNOSIS — I10 Essential (primary) hypertension: Secondary | ICD-10-CM

## 2017-01-30 LAB — BASIC METABOLIC PANEL
ANION GAP: 8 (ref 5–15)
BUN: 8 mg/dL (ref 6–20)
CALCIUM: 9.6 mg/dL (ref 8.9–10.3)
CHLORIDE: 103 mmol/L (ref 101–111)
CO2: 27 mmol/L (ref 22–32)
Creatinine, Ser: 1.01 mg/dL (ref 0.61–1.24)
GFR calc non Af Amer: >60 mL/min (ref >60)
Glucose, Bld: 108 mg/dL — ABNORMAL HIGH (ref 65–99)
Potassium: 4 mmol/L (ref 3.5–5.1)
Sodium: 138 mmol/L (ref 135–145)

## 2017-01-30 LAB — CBC
HCT: 37.7 % — ABNORMAL LOW (ref 39.0–52.0)
HEMOGLOBIN: 11.7 g/dL — AB (ref 13.0–17.0)
MCH: 24.9 pg — AB (ref 26.0–34.0)
MCHC: 31 g/dL (ref 30.0–36.0)
MCV: 80.2 fL (ref 78.0–100.0)
Platelets: 364 10*3/uL (ref 150–400)
RBC: 4.7 MIL/uL (ref 4.22–5.81)
RDW: 14.7 % (ref 11.5–15.5)
WBC: 8.1 10*3/uL (ref 4.0–10.5)

## 2017-01-30 LAB — I-STAT TROPONIN, ED: TROPONIN I, POC: 0 ng/mL (ref 0.00–0.08)

## 2017-01-30 MED ORDER — AMLODIPINE BESYLATE 10 MG PO TABS: 10.0000 mg | ORAL_TABLET | Freq: Every day | ORAL | 5 refills | Status: DC

## 2017-01-30 MED ORDER — TRAMADOL HCL 50 MG PO TABS: 50.0000 mg | ORAL_TABLET | Freq: Four times a day (QID) | ORAL | 0 refills | Status: DC | PRN

## 2017-01-30 MED FILL — ?AMLODIPINE BESYLATE 10 MG: 10 | 30 days supply | Qty: 30 | Fill #0

## 2017-01-30 NOTE — Patient Instructions (Signed)
Community Resources  Advocacy/Legal Legal Aid [place]:  1-866-219-5262  /  336-272-0148  Family Justice Center:  336-641-7233  [facility] [place] 24-hr Crisis line:  336-273-7273  Women's Resource Center, [place]:  336-275-6090  Court Watch (custody):  336-275-2346  [place] Humanitarian Law Clinic:   336-279-9299    Baby & Breastfeeding Car Seat Inspection @ Various [place] Fire Depts.- call 336-373-2177  [place] Lactation  336-832-6860  [place] [facility] 336-878-6712  WIC: 336-641-3663 ([place]);  336-641-7571 ([place])  La Leche League:  1-877-452-5321   Childcare [place] [facility] Development: 336-369-5097 ([place]) / 336-887-8224 ([place])  - Child Care Resources/ Referrals/ Scholarships  - Head Start/ Early Head Start (call or apply online)  [place] DHHS: [place] Pre-K :  1-800-859-0829 / 336-274-5437   Employment / Job Search Women's [facility] [place]: 336-275-6090 / [address]  [place] Works Career Center (JobLink): 336-373-5922 ([place]) / 336-882-4141 ([place])  [facility] Community Resource/ Career Center: 336-275-9801 / 336-282-7307  [facility] [employer] Job & Career Center: 336-373-3764  DHHS Work First: 336-641-3447 ([place]) / 336-641-3447 ([place])  StepUp Ministry [place]:  336-676-5871   Financial Assistance [place] [facility]:  336-553-2657  Salvation Army: 336-235-0368  [place] Network (furniture):  336-370-4002  [place] Helping Hands: 336-373-4264  Low Income Energy Assistance  336-641-3000   Food Assistance DHHS- SNAP/ Food Stamps: 336-641-4588  WIC: [place]- 336-641-3663 ;  [place] 336-641-7571  Little Green Book- Free Meals  Little Blue Book- Free Food Pantries  During the summer, text "FOOD" to 877877   General Health / Clinics (Adults) Orange Card (for Adults) through [place] [facility]: (336) 895-4900  [facility]:   336-832-8035  [place] [facility]:   336-832-4444  Health Department:  336-641-3245  [place]  [place] [facility]:  336-415-3877 / 336-641-2100  Planned Parenthood of [place]:   336-373-0678  [facility]:   336-334-4822 x 50251   Housing [place] Housing Coalition:   336-691-9521  [place] Housing Authority:  336-275-8501  Affordable Housing Managemnt:  336-273-0568   Immigrant/ Refugee Center for [place] (UNCG):  336-256-1065  Faith Action International [facility]:  336-379-0037  [employer]:  336-937-4701  Church World Services:  336-617-0381  African Services Coalition:  336-574-2677   LGBTQ YouthSAFE  www.youthsafe[place].org  PFLAG  336-541-6754 / info@[place].org  The Trevor Project:  1-866-488-7386   Mental Health/ Substance Use [facility] [place]  336-387-6161  [place] [facility]:  336-832-9700 or 1-800-711-2635  [place]'s Circle of Care:  336-271-5888  [facility]:  336-294-1349  [place] Care Services:  336-542-2884  [facility] (walk-ins)  336-676-6840 / [address]  [place]:  800-449-1287  Alcoholics Anonymous:  336-854-4278  Narcotics Anonymous:  800-365-1036  Quit Smoking Hotline:  800-QUIT-NOW (800-784-8669)   Parenting Children's Home Society:  800-632-1400  [place]: Education Center & Support Groups:  336-832-6682  YWCA: 336-273-3461  [employer]CG: Bringing Out the Best:  336-334-3120               Thriving at Three (Hispanic families): 336-256-1066  Healthy Start ([facility] [place]):  336-387-6161 x2288  Parents as Teachers:  336-691-0024  [place] Child Development- Learning Together (Immigrants): 336-369-5001   Poison Control 800-222-1222  Sports & Recreation [employer] Open Doors Application: ymcanwnc.org/join/open-doors-financial-assistance/  City of [place] Recreation Centers: http://www.[place].gov/index.aspx?page=3615   Special Needs Family Support Network:  336-832-6507  Autism Society of [place]:   336-333-0197 x1402 or x1412 /  800-785-1035  TEACCH [place]:  336-334-5773     ARC of [place]:  336-373-1076  Children's Developmental Service Agency (CDSA):  336-334-5601  CC4C (Care Coordination for Children):  336-641-7641   Transportation Medicaid Transportation: 336-641-4848 to apply  [place] Transit Authority: 336-335-6499 (reduced-fare bus ID to Medicaid/ Medicare/ Orange Card)  SCAT Paratransit services: Eligible riders only, call 336-333-6589 for application   Tutoring/Mentoring Black Child Development Institute: 336-230-2138  Big Brothers/ Big Sisters: 336-378-9100 ([place])  336-882-4167 ([place])  ACES through child's school: 336-370-2321  [employer] Achievers: contact your local Y  SHIELD Mentor Program: 336-337-2771   

## 2017-01-30 NOTE — ED Triage Notes (Signed)
Pt endorses generalized non radiating chest pain x 2 hours that began while laying down with shob. Denies nausea or dizziness.

## 2017-01-30 NOTE — Progress Notes (Signed)
Subjective:  Patient ID: Dennis Harris, male    DOB: 1966/02/02  Age: 51 y.o. MRN: [id number]  CC: HTN  HPI ROCIO Harris is a 51 y.o. male with a PMH of  Avascular Necrosis, HTN, hypercholesterolemia, RA, and pericarditis presents for f/u of HTN. Had recent right hip arthroplasty. Done with physical therapy. Physical therapy at home three times a week for one month. Pain in right hip currently 2/10 and at worst 3-4/10. Denies fatigue, CP, palpitations, SOB, HA, abdominal pain, f/c/n/v, rash, or GI/GU sxs.     Outpatient Medications Prior to Visit  Medication Sig Dispense Refill  . aspirin 81 MG chewable tablet Chew 1 tablet (81 mg total) by mouth 2 (two) times daily. 35 tablet 0  . HYDROCORTISONE EX Apply 1 application topically 2 (two) times daily as needed (for razor bumps/rash/itching.).    Marland Kitchen methocarbamol (ROBAXIN) 500 MG tablet Take 1 tablet (500 mg total) by mouth every 6 (six) hours as needed for muscle spasms. 40 tablet 1  . Omega-3 Fatty Acids (FISH OIL) 1000 MG CAPS Take 1,000 mg by mouth daily.    Marland Kitchen oxyCODONE-acetaminophen (ROXICET) 5-325 MG tablet Take 1-2 tablets by mouth every 8 (eight) hours as needed for severe pain. 60 tablet 0  . losartan-hydrochlorothiazide (HYZAAR) 50-12.5 MG tablet Take 2 tablets by mouth daily. 60 tablet 2  . traMADol (ULTRAM) 50 MG tablet Take 1 tablet (50 mg total) by mouth every 6 (six) hours as needed. 60 tablet 0   No facility-administered medications prior to visit.      ROS Review of Systems  Constitutional: Negative for chills, fever and malaise/fatigue.  Eyes: Negative for blurred vision.  Respiratory: Negative for shortness of breath.   Cardiovascular: Negative for chest pain and palpitations.  Gastrointestinal: Negative for abdominal pain and nausea.  Genitourinary: Negative for dysuria and hematuria.  Musculoskeletal: Positive for joint pain. Negative for myalgias.  Skin: Negative for rash.  Neurological: Negative for  tingling and headaches.  Psychiatric/Behavioral: Negative for depression. The patient is not nervous/anxious.     Objective:  BP (!) 164/82 (BP Location: Right Arm, Patient Position: Sitting, Cuff Size: Normal)   Pulse 86   Temp 98.2 F (36.8 C) (Oral)   Wt 144 lb 6.4 oz (65.5 kg)   SpO2 100%   BMI 20.43 kg/m   BP/Weight 01/30/2017 12/15/2016 12/13/2016  Systolic BP 164 107 -  Diastolic BP 82 63 -  Wt. (Lbs) 144.4 - 140  BMI 20.43 - 19.8      Physical Exam  Constitutional: He is oriented to person, place, and time.  thin, NAD, polite  HENT:  Head: Normocephalic and atraumatic.  Eyes: No scleral icterus.  Cardiovascular: Normal rate, regular rhythm and normal heart sounds.   Pulmonary/Chest: Effort normal and breath sounds normal.  Musculoskeletal: He exhibits no edema.  Neurological: He is alert and oriented to person, place, and time.  Skin: Skin is warm and dry.  Right hip with clean and dry surgical incisional scar that is mildly tender to palpation.  Psychiatric: He has a normal mood and affect. His behavior is normal. Thought content normal.  Vitals reviewed.    Assessment & Plan:   1. Essential hypertension - Begin amLODipine (NORVASC) 10 MG tablet; Take 1 tablet (10 mg total) by mouth daily.  Dispense: 30 tablet; Refill: 5 - Continue Hyzaar   2. History of total right hip arthroplasty - traMADol (ULTRAM) 50 MG tablet; Take 1 tablet (50 mg total) by mouth every  6 (six) hours as needed.  Dispense: 60 tablet; Refill: 0   Meds ordered this encounter  Medications  . DISCONTD: amLODipine (NORVASC) 10 MG tablet    Sig: Take 1 tablet (10 mg total) by mouth daily.    Dispense:  30 tablet    Refill:  5    Order Specific Question:   Supervising Provider    Answer:   Quentin Angst [id number]  . DISCONTD: traMADol (ULTRAM) 50 MG tablet    Sig: Take 1 tablet (50 mg total) by mouth every 6 (six) hours as needed.    Dispense:  60 tablet    Refill:  0    Order  Specific Question:   Supervising Provider    Answer:   Quentin Angst [id number]  . traMADol (ULTRAM) 50 MG tablet    Sig: Take 1 tablet (50 mg total) by mouth every 6 (six) hours as needed.    Dispense:  60 tablet    Refill:  0    Order Specific Question:   Supervising Provider    Answer:   Quentin Angst [id number]  . amLODipine (NORVASC) 10 MG tablet    Sig: Take 1 tablet (10 mg total) by mouth daily.    Dispense:  30 tablet    Refill:  5    Order Specific Question:   Supervising Provider    Answer:   Quentin Angst [id number]    Follow-up: Return in about 4 weeks (around 02/27/2017) for HTN.   Loletta Specter PA

## 2017-01-31 ENCOUNTER — Ambulatory Visit (INDEPENDENT_AMBULATORY_CARE_PROVIDER_SITE_OTHER): Payer: Self-pay | Admitting: Physician Assistant

## 2017-01-31 ENCOUNTER — Emergency Department (HOSPITAL_COMMUNITY)
Admission: EM | Admit: 2017-01-31 | Discharge: 2017-01-31 | Disposition: A | Discharge status: Home / Self Care | Payer: Self-pay | Attending: Emergency Medicine | Admitting: Emergency Medicine

## 2017-01-31 DIAGNOSIS — Z96641 Presence of right artificial hip joint: Secondary | ICD-10-CM

## 2017-01-31 MED ORDER — OXYCODONE-ACETAMINOPHEN 5-325 MG PO TABS: 1.0000 | ORAL_TABLET | Freq: Three times a day (TID) | ORAL | 0 refills | Status: DC | PRN

## 2017-01-31 NOTE — Progress Notes (Signed)
[name] returns today 7 weeks status post right total hip arthroplasty. He states he wants to get back to work part time in light duty since possible due to economic reasons. 7 pain in the hip and stiffness particularly whenever he gets up from a seated position. Had an episode of some chest pain last night went to the ER reports that he had 2 EKGs performed and these were negative for acute event. He left the ER after being there 8 hours.  Right hip surgical incision is healing well no signs of infection calf supple nontender. He has stiff motion of the hip. He ambulates without any assistive device.  Plan: Scar tissue mobilization is encouraged. He'll continue work on his range of motion strengthening the hip. Follow with [place] in a month. He was given a note to go back to work on 02/01/17 light duties with restrictions. We will reevaluate his restrictions and work status at next office visit. Advised him to follow-up with his primary care physician before the chest pain or failure returns to go to the ER.

## 2017-02-08 ENCOUNTER — Encounter: Payer: Self-pay | Admitting: Physician Assistant

## 2017-02-27 ENCOUNTER — Ambulatory Visit (INDEPENDENT_AMBULATORY_CARE_PROVIDER_SITE_OTHER): Payer: Self-pay | Admitting: Physician Assistant

## 2017-03-06 ENCOUNTER — Encounter (INDEPENDENT_AMBULATORY_CARE_PROVIDER_SITE_OTHER): Payer: Self-pay | Admitting: Orthopaedic Surgery

## 2017-03-06 ENCOUNTER — Ambulatory Visit (INDEPENDENT_AMBULATORY_CARE_PROVIDER_SITE_OTHER): Payer: Self-pay | Admitting: Orthopaedic Surgery

## 2017-03-06 DIAGNOSIS — Z96641 Presence of right artificial hip joint: Secondary | ICD-10-CM

## 2017-03-06 MED ORDER — OXYCODONE-ACETAMINOPHEN 5-325 MG PO TABS: 1.0000 | ORAL_TABLET | Freq: Two times a day (BID) | ORAL | 0 refills | Status: DC | PRN

## 2017-03-06 NOTE — Progress Notes (Signed)
The patient is now 3 months status post a right total hip arthroplasty. He says he is ready go back to work tomorrow and he has no issues at all.  On examination his right hip incision looks great. His leg lengths are equal. He tolerates me putting his right hip through full range of motion with no pain at all.  This point released to full work duty starting tomorrow. I do not need to see him back for 6 months. I will give a one-time prescription for some oxycodone. At his 6 month follow-up visit I would like a low AP pelvis and lateral of his right operative hip. All questions were encouraged and answered.

## 2017-03-17 ENCOUNTER — Ambulatory Visit (INDEPENDENT_AMBULATORY_CARE_PROVIDER_SITE_OTHER): Payer: Self-pay | Admitting: Physician Assistant

## 2017-06-12 ENCOUNTER — Ambulatory Visit (INDEPENDENT_AMBULATORY_CARE_PROVIDER_SITE_OTHER): Payer: Medicaid Other

## 2017-06-12 ENCOUNTER — Ambulatory Visit (INDEPENDENT_AMBULATORY_CARE_PROVIDER_SITE_OTHER): Payer: Medicaid Other | Admitting: Physician Assistant

## 2017-06-12 ENCOUNTER — Ambulatory Visit (INDEPENDENT_AMBULATORY_CARE_PROVIDER_SITE_OTHER): Payer: Self-pay

## 2017-06-12 ENCOUNTER — Encounter (INDEPENDENT_AMBULATORY_CARE_PROVIDER_SITE_OTHER): Payer: Self-pay | Admitting: Physician Assistant

## 2017-06-12 DIAGNOSIS — M25551 Pain in right hip: Secondary | ICD-10-CM | POA: Diagnosis not present

## 2017-06-12 MED ORDER — HYDROCODONE-ACETAMINOPHEN 5-325 MG PO TABS: 1.0000 | ORAL_TABLET | Freq: Four times a day (QID) | ORAL | 0 refills | Status: DC | PRN

## 2017-06-12 NOTE — Progress Notes (Signed)
Office Visit Note   Patient: Dennis Harris           Date of Birth: Jun 11, 1966           MRN: [id number] Visit Date: 06/12/2017              Requested by: Loletta Specter, PA-C [address] [place], [place] [address] PCP: Loletta Specter, PA-C   Assessment & Plan: Visit Diagnoses:  1. Pain in right hip     Plan: We will take him out of work for the next 2 weeks just to let him rest.  Recommend he continue taking over-the-counter anti-inflammatory.  He is given some Norco to use sparingly.  He will follow-up with [place] in 2 months to check his progress lack of.  Follow-Up Instructions: Return in about 8 weeks (around 08/07/2017).   Orders:  Orders Placed This Encounter  Procedures  . XR HIP UNILAT W OR W/O PELVIS 1V RIGHT   Meds ordered this encounter  Medications  . HYDROcodone-acetaminophen (NORCO) 5-325 MG tablet    Sig: Take 1 tablet every 6 (six) hours as needed by mouth for moderate pain. One to two tabs every 4-6 hours for pain    Dispense:  30 tablet    Refill:  0      Procedures: No procedures performed   Clinical Data: No additional findings.   Subjective: Chief Complaint  Patient presents with  . Right Hip - Follow-up    HPI Dennis Harris returns today 6 months status post right total hip arthroplasty.  He states that he is been having pain since he went back to work in the hip.  States he has pain daily especially after working a long day.  He is tried Aleve and ibuprofen with no relief.  This past Friday he slid down his steps due to some ice on the steps and injured his right hip.  States he is having no shortness of breath, chest pain, fevers, chills.  Denies any other injury at the time of the fall. Review of Systems See HPI  Objective: Vital Signs: There were no vitals taken for this visit.  Physical Exam  Constitutional: He is oriented to person, place, and time. He appears well-developed and well-nourished. No distress.    Neurological: He is alert and oriented to person, place, and time.  Skin: He is not diaphoretic.  Psychiatric: He has a normal mood and affect.    Ortho Exam Left hip he has good range of motion without pain.  Right hip he has some pain with internal rotation of the hip but overall good range of motion.  No tenderness over the trochanteric region. Specialty Comments:  No specialty comments available.  Imaging: Xr Hip Unilat W Or W/o Pelvis 1v Right  Result Date: 06/12/2017 AP pelvis lateral view of the right hip: No acute fracture.  No bony abnormalities.  No hardware failure.  No loosening of the right total hip components.      PMFS History: Patient Active Problem List   Diagnosis Date Noted  . Avascular necrosis of hip, right (HCC) 12/13/2016  . 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 Avascular necrosis of bones of both hips (HCC) 12/07/2016  . Pain of left hip joint 12/07/2016  . Pain of right hip joint 12/07/2016  . SIRS (systemic inflammatory response syndrome) (HCC) 08/07/2014  . Cardiomyopathy- EF 40-45% presumed secondary to acute illness 08/07/2014  . CAP (community acquired pneumonia)   .  Right upper quadrant pain   . Acute pericarditis 08/04/2014  . Acute hyponatremia 08/04/2014  . Protein-calorie malnutrition, severe (HCC) 08/04/2014  . Sepsis (HCC) 08/03/2014  . Legionella pneumonia (HCC) 08/03/2014  . Rheumatoid arthritis (HCC) 08/03/2014  . Chest pain 08/03/2014  . Elevated troponin- not fel to be secondary to MI 08/03/2014  . Nausea vomiting and diarrhea 08/03/2014  . Cervical spondylosis without myelopathy 11/06/2012   Past Medical History:  Diagnosis Date  . 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 Exertional shortness of breath    "at times" (11/14/2012)  . Hypercholesteremia   . Hypertension   . Pericarditis ~ 2009  . Pneumonia   . Rheumatoid arthritis(714.0)     Family History  Problem Relation Age of Onset  .  Cirrhosis Father   . Lupus Sister   . Lupus Sister   . Hypertension Mother   . Stroke Mother   . Diabetes Mother     Past Surgical History:  Procedure Laterality Date  . ANTERIOR CERVICAL DECOMPRESSION/DISCECTOMY FUSION 1 LEVEL C5-6 N/A 11/14/2012   Performed by Venita Lick, MD at [facility] OR  . APPENDECTOMY  [phone]  . BACK SURGERY    . CARDIAC CATHETERIZATION  May 2010   normal  . JOINT REPLACEMENT    . RIGHT TOTAL HIP ARTHROPLASTY ANTERIOR APPROACH Right 12/13/2016   Performed by Kathryne Hitch, MD at [facility] OR  . TONSILLECTOMY  1970's?  . TOTAL HIP ARTHROPLASTY Right 12/13/2016   Social History   Occupational History  . Not on file  Tobacco Use  . Smoking status: Current Every Day Smoker    Packs/day: 0.50    Years: 37.00    Pack years: 18.50    Types: Cigarettes  . Smokeless tobacco: Never Used  Substance and Sexual Activity  . Alcohol use: Yes    Alcohol/week: 8.4 oz    Types: 14 Cans of beer per week    Comment: 2-3 12 ounce cans of beer per day  . Drug use: Yes    Types: Marijuana    Comment: 12/13/2016 "nothing in years"  . Sexual activity: Yes

## 2017-06-21 ENCOUNTER — Ambulatory Visit (INDEPENDENT_AMBULATORY_CARE_PROVIDER_SITE_OTHER): Payer: Self-pay | Admitting: Physician Assistant

## 2017-06-22 ENCOUNTER — Encounter (INDEPENDENT_AMBULATORY_CARE_PROVIDER_SITE_OTHER): Payer: Self-pay | Admitting: Physician Assistant

## 2017-06-22 ENCOUNTER — Other Ambulatory Visit: Payer: Self-pay

## 2017-06-22 ENCOUNTER — Ambulatory Visit (INDEPENDENT_AMBULATORY_CARE_PROVIDER_SITE_OTHER): Payer: Medicaid Other | Admitting: Physician Assistant

## 2017-06-22 VITALS — BP 169/89 | HR 86 | Temp 98.0°F | Wt 149.6 lb

## 2017-06-22 DIAGNOSIS — Z1211 Encounter for screening for malignant neoplasm of colon: Secondary | ICD-10-CM | POA: Diagnosis not present

## 2017-06-22 DIAGNOSIS — I10 Essential (primary) hypertension: Secondary | ICD-10-CM

## 2017-06-22 DIAGNOSIS — R011 Cardiac murmur, unspecified: Secondary | ICD-10-CM | POA: Diagnosis not present

## 2017-06-22 MED ORDER — HYDROCHLOROTHIAZIDE 25 MG PO TABS: 25.0000 mg | ORAL_TABLET | Freq: Every day | ORAL | 1 refills | Status: DC

## 2017-06-22 MED ORDER — ASPIRIN 81 MG PO CHEW: 81.0000 mg | CHEWABLE_TABLET | Freq: Two times a day (BID) | ORAL | 3 refills | Status: DC

## 2017-06-22 MED ORDER — AMLODIPINE BESYLATE 10 MG PO TABS: 10.0000 mg | ORAL_TABLET | Freq: Every day | ORAL | 1 refills | Status: DC

## 2017-06-22 MED ORDER — CLONIDINE HCL 0.1 MG PO TABS
0.1000 mg | ORAL_TABLET | Freq: Once | ORAL | Status: AC
  Administered 2017-06-22: 0.1 mg via ORAL

## 2017-06-22 MED ORDER — LOSARTAN POTASSIUM 100 MG PO TABS: 100.0000 mg | ORAL_TABLET | Freq: Every day | ORAL | 1 refills | Status: DC

## 2017-06-22 MED FILL — HYDROCHLOROTHIAZIDE 25 MG T: 25 | 30 days supply | Qty: 30 | Fill #0

## 2017-06-22 MED FILL — AMLODIPINE BESYLATE 10 MG T: 10 | 30 days supply | Qty: 30 | Fill #0

## 2017-06-22 MED FILL — LOSARTAN POTASSIUM 100 MG T: 100 | 30 days supply | Qty: 30 | Fill #0

## 2017-06-22 NOTE — Progress Notes (Signed)
Pt complains that every night he has cramps in his feet and calves  Pt has not been taking any hypertension medication as he states he was unaware that he had refills on the medication and his work schedule has been so busy he didn't have time to call or come by for refill request.

## 2017-06-22 NOTE — Progress Notes (Signed)
Subjective:  Patient ID: Dennis Harris, male    DOB: Oct 16, 1965  Age: 51 y.o. MRN: [id number]  CC: f/u HTN  HPI Dennis L Arringtonis a 51 y.o.malewith a PMH of Avascular Necrosis, HTN, hypercholesterolemia, RA, and pericarditis presents for f/u of HTN. Last seen here nearly 5 months ago. BP at the time was 164/82. Instructed to begin amlodipine and to continue Hyzaar. Has not taken anti-hypertensives for two months. Pt has not been taking any hypertension medication as he states he was unaware that he had refills on the medication and his work schedule has been so busy he didn't have time to call or come by for refill request. 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 Does not endorse CP, palpitations, SOB, HA, paresthesias, swelling, f/c/n/v, or GI/GU sxs.    Outpatient Medications Prior to Visit  Medication Sig Dispense Refill  . HYDROcodone-acetaminophen (NORCO) 5-325 MG tablet Take 1 tablet every 6 (six) hours as needed by mouth for moderate pain. One to two tabs every 4-6 hours for pain 30 tablet 0  . HYDROCORTISONE EX Apply 1 application topically 2 (two) times daily as needed (for razor bumps/rash/itching.).    Marland Kitchen amLODipine (NORVASC) 10 MG tablet Take 1 tablet (10 mg total) by mouth daily. (Patient not taking: Reported on 06/22/2017) 30 tablet 5  . aspirin 81 MG chewable tablet Chew 1 tablet (81 mg total) by mouth 2 (two) times daily. (Patient not taking: Reported on 06/22/2017) 35 tablet 0  . methocarbamol (ROBAXIN) 500 MG tablet Take 1 tablet (500 mg total) by mouth every 6 (six) hours as needed for muscle spasms. (Patient not taking: Reported on 06/22/2017) 40 tablet 1  . Omega-3 Fatty Acids (FISH OIL) 1000 MG CAPS Take 1,000 mg by mouth daily.    Marland Kitchen oxyCODONE-acetaminophen (ROXICET) 5-325 MG tablet Take 1 tablet by mouth 2 (two) times daily as needed for severe pain. (Patient not taking: Reported on 06/22/2017)  60 tablet 0  . traMADol (ULTRAM) 50 MG tablet Take 1 tablet (50 mg total) by mouth every 6 (six) hours as needed. (Patient not taking: Reported on 03/06/2017) 60 tablet 0   No facility-administered medications prior to visit.      ROS Review of Systems  Constitutional: Negative for chills, fever and malaise/fatigue.  Eyes: Negative for blurred vision.  Respiratory: Negative for shortness of breath.   Cardiovascular: Negative for chest pain and palpitations.  Gastrointestinal: Negative for abdominal pain and nausea.  Genitourinary: Negative for dysuria and hematuria.  Musculoskeletal: Negative for joint pain and myalgias.  Skin: Negative for rash.  Neurological: Negative for tingling and headaches.  Psychiatric/Behavioral: Negative for depression. The patient is not nervous/anxious.     Objective:  BP (!) 212/112 (BP Location: Right Arm, Patient Position: Sitting, Cuff Size: Normal)   Pulse 86   Temp 98 F (36.7 C) (Oral)   Wt 149 lb 9.6 oz (67.9 kg)   SpO2 99%   BMI 21.16 kg/m   BP/Weight 06/22/2017 01/31/2017 01/30/2017  Systolic BP 212 - 147  Diastolic BP 112 - 107  Wt. (Lbs) 149.6 - 150  BMI 21.16 21.22 -      Physical Exam  Constitutional: He is oriented to person, place, and time.  Well developed, well nourished, NAD, polite  HENT:  Head: Normocephalic and atraumatic.  Eyes: Conjunctivae are normal. No scleral icterus.  Neck: Normal range of motion. Neck supple. No thyromegaly present.  Cardiovascular: Normal rate and  regular rhythm.  Murmur (1/6 systolic murmur) heard. Pulmonary/Chest: Effort normal and breath sounds normal. No respiratory distress. He has no wheezes.  Musculoskeletal: He exhibits no edema.  Neurological: He is alert and oriented to person, place, and time. No cranial nerve deficit. Coordination normal.  Skin: Skin is warm and dry. No rash noted. No erythema. No pallor.  Psychiatric: He has a normal mood and affect. His behavior is normal.  Thought content normal.  Vitals reviewed.    Assessment & Plan:   1. Hypertension, unspecified type - Administered cloNIDine (CATAPRES) tablet 0.1 mg, two tablets, once, in clinic today. - CBC with Differential - Comprehensive metabolic panel - Lipid panel - TSH - aspirin 81 MG chewable tablet; Chew 1 tablet (81 mg total) by mouth 2 (two) times daily.  Dispense: 90 tablet; Refill: 3 - Begin hydrochlorothiazide (HYDRODIURIL) 25 MG tablet; Take 1 tablet (25 mg total) by mouth daily. Take on tablet in the morning.  Dispense: 90 tablet; Refill: 1 - Refill amLODipine (NORVASC) 10 MG tablet; Take 1 tablet (10 mg total) by mouth daily.  Dispense: 90 tablet; Refill: 1 - Begin losartan (COZAAR) 100 MG tablet; Take 1 tablet (100 mg total) by mouth daily.  Dispense: 90 tablet; Refill: 1 - STOP Hyzaar due to recall  2. Special screening for malignant neoplasms, colon - Fecal occult blood, imunochemical  3. Heart murmur - Ambulatory referral to Cardiology   Meds ordered this encounter  Medications  . cloNIDine (CATAPRES) tablet 0.1 mg  . aspirin 81 MG chewable tablet    Sig: Chew 1 tablet (81 mg total) by mouth 2 (two) times daily.    Dispense:  90 tablet    Refill:  3    Order Specific Question:   Supervising Provider    Answer:   Quentin Angst [id number]  . hydrochlorothiazide (HYDRODIURIL) 25 MG tablet    Sig: Take 1 tablet (25 mg total) by mouth daily. Take on tablet in the morning.    Dispense:  90 tablet    Refill:  1    Order Specific Question:   Supervising Provider    Answer:   Quentin Angst [id number]  . amLODipine (NORVASC) 10 MG tablet    Sig: Take 1 tablet (10 mg total) by mouth daily.    Dispense:  90 tablet    Refill:  1    Order Specific Question:   Supervising Provider    Answer:   Quentin Angst [id number]  . losartan (COZAAR) 100 MG tablet    Sig: Take 1 tablet (100 mg total) by mouth daily.    Dispense:  90 tablet    Refill:  1    Order  Specific Question:   Supervising Provider    Answer:   Quentin Angst [id number]    Follow-up: Return in about 2 weeks (around 07/06/2017) for HTN.   Loletta Specter PA

## 2017-06-22 NOTE — Patient Instructions (Signed)

## 2017-06-23 ENCOUNTER — Other Ambulatory Visit (INDEPENDENT_AMBULATORY_CARE_PROVIDER_SITE_OTHER): Payer: Self-pay | Admitting: Physician Assistant

## 2017-06-23 DIAGNOSIS — E7841 Elevated Lipoprotein(a): Secondary | ICD-10-CM

## 2017-06-23 LAB — CBC WITH DIFFERENTIAL/PLATELET
BASOS ABS: 0 10*3/uL (ref 0.0–0.2)
Basos: 0 %
EOS (ABSOLUTE): 0 10*3/uL (ref 0.0–0.4)
Eos: 0 %
Hematocrit: 37.9 % (ref 37.5–51.0)
Hemoglobin: 11.3 g/dL — ABNORMAL LOW (ref 13.0–17.7)
IMMATURE GRANULOCYTES: 0 %
Immature Grans (Abs): 0 10*3/uL (ref 0.0–0.1)
Lymphocytes Absolute: 2 10*3/uL (ref 0.7–3.1)
Lymphs: 29 %
MCH: 24.2 pg — ABNORMAL LOW (ref 26.6–33.0)
MCHC: 29.8 g/dL — AB (ref 31.5–35.7)
MCV: 81 fL (ref 79–97)
MONOCYTES: 7 %
MONOS ABS: 0.5 10*3/uL (ref 0.1–0.9)
NEUTROS PCT: 64 %
Neutrophils Absolute: 4.4 10*3/uL (ref 1.4–7.0)
Platelets: 328 10*3/uL (ref 150–379)
RBC: 4.67 x10E6/uL (ref 4.14–5.80)
RDW: 14.8 % (ref 12.3–15.4)
WBC: 6.9 10*3/uL (ref 3.4–10.8)

## 2017-06-23 LAB — COMPREHENSIVE METABOLIC PANEL
ALK PHOS: 105 IU/L (ref 39–117)
ALT: 16 IU/L (ref 0–44)
AST: 24 IU/L (ref 0–40)
Albumin/Globulin Ratio: 1 — ABNORMAL LOW (ref 1.2–2.2)
Albumin: 4.3 g/dL (ref 3.5–5.5)
BUN/Creatinine Ratio: 10 (ref 9–20)
BUN: 9 mg/dL (ref 6–24)
Bilirubin Total: 0.4 mg/dL (ref 0.0–1.2)
CO2: 23 mmol/L (ref 20–29)
CREATININE: 0.9 mg/dL (ref 0.76–1.27)
Calcium: 9.4 mg/dL (ref 8.7–10.2)
Chloride: 101 mmol/L (ref 96–106)
GFR calc Af Amer: 113 mL/min/{1.73_m2} (ref >59)
GFR calc non Af Amer: 98 mL/min/{1.73_m2} (ref >59)
GLUCOSE: 80 mg/dL (ref 65–99)
Globulin, Total: 4.2 g/dL (ref 1.5–4.5)
Potassium: 4.5 mmol/L (ref 3.5–5.2)
Sodium: 138 mmol/L (ref 134–144)
Total Protein: 8.5 g/dL (ref 6.0–8.5)

## 2017-06-23 LAB — TSH: TSH: 0.678 u[IU]/mL (ref 0.450–4.500)

## 2017-06-23 LAB — LIPID PANEL
CHOLESTEROL TOTAL: 243 mg/dL — AB (ref 100–199)
Chol/HDL Ratio: 4.4 ratio (ref 0.0–5.0)
HDL: 55 mg/dL (ref >39)
LDL CALC: 161 mg/dL — AB (ref 0–99)
TRIGLYCERIDES: 135 mg/dL (ref 0–149)
VLDL CHOLESTEROL CAL: 27 mg/dL (ref 5–40)

## 2017-06-23 MED ORDER — ATORVASTATIN CALCIUM 40 MG PO TABS: 40.0000 mg | ORAL_TABLET | Freq: Every day | ORAL | 3 refills | Status: DC

## 2017-07-01 ENCOUNTER — Encounter (HOSPITAL_COMMUNITY): Payer: Self-pay | Admitting: Emergency Medicine

## 2017-07-01 ENCOUNTER — Emergency Department (HOSPITAL_COMMUNITY): Payer: Medicaid Other

## 2017-07-01 ENCOUNTER — Emergency Department (HOSPITAL_COMMUNITY)
Admission: EM | Admit: 2017-07-01 | Discharge: 2017-07-01 | Disposition: A | Discharge status: Home / Self Care | Payer: Medicaid Other | Attending: Emergency Medicine | Admitting: Emergency Medicine

## 2017-07-01 DIAGNOSIS — D649 Anemia, unspecified: Secondary | ICD-10-CM | POA: Diagnosis not present

## 2017-07-01 DIAGNOSIS — Z7982 Long term (current) use of aspirin: Secondary | ICD-10-CM | POA: Diagnosis not present

## 2017-07-01 DIAGNOSIS — Z79899 Other long term (current) drug therapy: Secondary | ICD-10-CM | POA: Insufficient documentation

## 2017-07-01 DIAGNOSIS — Z96641 Presence of right artificial hip joint: Secondary | ICD-10-CM | POA: Diagnosis not present

## 2017-07-01 DIAGNOSIS — F17210 Nicotine dependence, cigarettes, uncomplicated: Secondary | ICD-10-CM | POA: Insufficient documentation

## 2017-07-01 DIAGNOSIS — E876 Hypokalemia: Secondary | ICD-10-CM | POA: Insufficient documentation

## 2017-07-01 DIAGNOSIS — R55 Syncope and collapse: Secondary | ICD-10-CM | POA: Insufficient documentation

## 2017-07-01 DIAGNOSIS — I10 Essential (primary) hypertension: Secondary | ICD-10-CM | POA: Diagnosis not present

## 2017-07-01 DIAGNOSIS — T502X5A Adverse effect of carbonic-anhydrase inhibitors, benzothiadiazides and other diuretics, initial encounter: Secondary | ICD-10-CM | POA: Diagnosis not present

## 2017-07-01 LAB — CBG MONITORING, ED: GLUCOSE-CAPILLARY: 95 mg/dL (ref 65–99)

## 2017-07-01 LAB — CBC
HCT: 35.6 % — ABNORMAL LOW (ref 39.0–52.0)
HEMOGLOBIN: 11.4 g/dL — AB (ref 13.0–17.0)
MCH: 25.1 pg — AB (ref 26.0–34.0)
MCHC: 32 g/dL (ref 30.0–36.0)
MCV: 78.2 fL (ref 78.0–100.0)
PLATELETS: 273 10*3/uL (ref 150–400)
RBC: 4.55 MIL/uL (ref 4.22–5.81)
RDW: 13.8 % (ref 11.5–15.5)
WBC: 6.9 10*3/uL (ref 4.0–10.5)

## 2017-07-01 LAB — URINALYSIS, ROUTINE W REFLEX MICROSCOPIC
BACTERIA UA: NONE SEEN
Bilirubin Urine: NEGATIVE
Glucose, UA: NEGATIVE mg/dL
KETONES UR: NEGATIVE mg/dL
Leukocytes, UA: NEGATIVE
Nitrite: NEGATIVE
PROTEIN: NEGATIVE mg/dL
SQUAMOUS EPITHELIAL / LPF: NONE SEEN
Specific Gravity, Urine: 1.006 (ref 1.005–1.030)
pH: 6 (ref 5.0–8.0)

## 2017-07-01 LAB — BASIC METABOLIC PANEL
Anion gap: 11 (ref 5–15)
BUN: 12 mg/dL (ref 6–20)
CHLORIDE: 101 mmol/L (ref 101–111)
CO2: 23 mmol/L (ref 22–32)
CREATININE: 0.89 mg/dL (ref 0.61–1.24)
Calcium: 9 mg/dL (ref 8.9–10.3)
GFR calc non Af Amer: >60 mL/min (ref >60)
Glucose, Bld: 97 mg/dL (ref 65–99)
Potassium: 3.3 mmol/L — ABNORMAL LOW (ref 3.5–5.1)
Sodium: 135 mmol/L (ref 135–145)

## 2017-07-01 MED ORDER — POTASSIUM CHLORIDE CRYS ER 20 MEQ PO TBCR
40.0000 meq | EXTENDED_RELEASE_TABLET | Freq: Once | ORAL | Status: AC
Start: 2017-07-01 — End: 2017-07-01
  Administered 2017-07-01: 40 meq via ORAL
  Filled 2017-07-01: qty 2

## 2017-07-01 MED ORDER — POTASSIUM CHLORIDE CRYS ER 20 MEQ PO TBCR
EXTENDED_RELEASE_TABLET | ORAL | 0 refills | Status: DC
Start: 2017-07-01 — End: 2019-11-20

## 2017-07-01 MED ORDER — HYDROCHLOROTHIAZIDE 12.5 MG PO TABS: 25.0000 mg | ORAL_TABLET | Freq: Every day | ORAL | 0 refills | Status: DC

## 2017-07-01 NOTE — Discharge Instructions (Signed)
Please reduce your HCTZ from 25 mg once a day, to 12.5 mg once a day (new prescription given).

## 2017-07-01 NOTE — ED Triage Notes (Signed)
Per EMS:  Pt presents to ED for assessment after having two syncopal episodes at home, the last one in the bathroom where patient struck his head, unsure on what.  Patient does not remember prior to syncope, but recalls waking up on the floor.  Pt denies any complaints at this time but some left sided head soreness.  Axox4.

## 2017-07-01 NOTE — ED Provider Notes (Signed)
[facility] EMERGENCY DEPARTMENT Provider Note   CSN: [id number] Arrival date & time: 07/01/17  0013     History   Chief Complaint Chief Complaint  Patient presents with  . Loss of Consciousness    HPI Dennis Harris is a 51 y.o. male.  The history is provided by the patient.  He has a history of hypertension, rheumatoid arthritis, hyperlipidemia.  Tonight, he had 2 syncopal episodes at home.  The first episode occurred immediately following urination.  He evidently hit his head as he fell.  His wife was present and try to get him up and he immediately passed out again.  She actually states he did not have complete loss of consciousness and he woke up with after only a few seconds to perhaps .  There is no incontinence and no bit lip or tongue.  She did state that he seemed like he was getting stiff when he was unconscious.  Patient denies any prodrome and denies chest pain, heaviness, tightness, pressure.  He denies any palpitations.  He did recently have change in his blood pressure medication with the addition of hydrochlorothiazide and losartan.  Past Medical History:  Diagnosis Date  . 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 Exertional shortness of breath    "at times" (11/14/2012)  . Hypercholesteremia   . Hypertension   . Pericarditis ~ 2009  . Pneumonia   . Rheumatoid arthritis(714.0)     Patient Active Problem List   Diagnosis Date Noted  . Avascular necrosis of hip, right (HCC) 12/13/2016  . 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 Avascular necrosis of bones of both hips (HCC) 12/07/2016  . Pain of left hip joint 12/07/2016  . Pain of right hip joint 12/07/2016  . SIRS (systemic inflammatory response syndrome) (HCC) 08/07/2014  . Cardiomyopathy- EF 40-45% presumed secondary to acute illness 08/07/2014  . CAP (community acquired pneumonia)   . Right upper quadrant pain   . Acute pericarditis 08/04/2014    . Acute hyponatremia 08/04/2014  . Protein-calorie malnutrition, severe (HCC) 08/04/2014  . Sepsis (HCC) 08/03/2014  . Legionella pneumonia (HCC) 08/03/2014  . Rheumatoid arthritis (HCC) 08/03/2014  . Chest pain 08/03/2014  . Elevated troponin- not fel to be secondary to MI 08/03/2014  . Nausea vomiting and diarrhea 08/03/2014  . Cervical spondylosis without myelopathy 11/06/2012    Past Surgical History:  Procedure Laterality Date  . ANTERIOR CERVICAL DECOMP/DISCECTOMY FUSION N/A 11/14/2012   Procedure: ANTERIOR CERVICAL DECOMPRESSION/DISCECTOMY FUSION 1 LEVEL C5-6;  Surgeon: Venita Lick, MD;  Location: [facility] OR;  Service: Orthopedics;  Laterality: N/A;  . APPENDECTOMY  1970's  . BACK SURGERY    . CARDIAC CATHETERIZATION  May 2010   normal  . JOINT REPLACEMENT    . TONSILLECTOMY  1970's?  . 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 TOTAL HIP ARTHROPLASTY Right 12/13/2016   Procedure: RIGHT TOTAL HIP ARTHROPLASTY ANTERIOR APPROACH;  Surgeon: Kathryne Hitch, MD;  Location: [facility] OR;  Service: Orthopedics;  Laterality: Right;       Home Medications    Prior to Admission medications   Medication Sig Start Date End Date Taking? Authorizing Provider  amLODipine (NORVASC) 10 MG tablet Take 1 tablet (10 mg total) by mouth daily. 06/22/17   Loletta Specter, PA-C  aspirin 81 MG chewable tablet Chew 1 tablet (81 mg total) by mouth 2 (two) times daily. 06/22/17   Loletta Specter, PA-C  atorvastatin (LIPITOR) 40 MG tablet Take 1 tablet (40 mg total) by mouth daily. 06/23/17   Loletta Specter, PA-C  hydrochlorothiazide (HYDRODIURIL) 25 MG tablet Take 1 tablet (25 mg total) by mouth daily. Take on tablet in the morning. 06/22/17   Loletta Specter, PA-C  HYDROcodone-acetaminophen (NORCO) 5-325 MG tablet Take 1 tablet every 6 (six) hours as needed by mouth for moderate pain. One to two tabs every 4-6 hours for pain 06/12/17   Kirtland Bouchard, PA-C  HYDROCORTISONE EX Apply 1  application topically 2 (two) times daily as needed (for razor bumps/rash/itching.).    [provider]  losartan (COZAAR) 100 MG tablet Take 1 tablet (100 mg total) by mouth daily. 06/22/17   Loletta Specter, PA-C  Omega-3 Fatty Acids (FISH OIL) 1000 MG CAPS Take 1,000 mg by mouth daily.    [provider]    Family History Family History  Problem Relation Age of Onset  . Cirrhosis Father   . Lupus Sister   . Lupus Sister   . Hypertension Mother   . Stroke Mother   . Diabetes Mother     Social History Social History   Tobacco Use  . Smoking status: Current Every Day Smoker    Packs/day: 0.50    Years: 37.00    Pack years: 18.50    Types: Cigarettes  . Smokeless tobacco: Never Used  Substance Use Topics  . Alcohol use: Yes    Alcohol/week: 8.4 oz    Types: 14 Cans of beer per week    Comment: 2-3 12 ounce cans of beer per day  . Drug use: Yes    Types: Marijuana    Comment: 12/13/2016 "nothing in years"     Allergies   Bee venom and Shellfish allergy   Review of Systems Review of Systems  All other systems reviewed and are negative.    Physical Exam Updated Vital Signs BP 124/78   Pulse 76   Temp 97.9 F (36.6 C) (Oral)   Resp 18   SpO2 98%   Physical Exam  Nursing note and vitals reviewed.  51 year old male, resting comfortably and in no acute distress. Vital signs are normal. Oxygen saturation is 98%, which is normal. Head is normocephalic and atraumatic. PERRLA, EOMI. Oropharynx is clear. Neck is nontender and supple without adenopathy or JVD.  There are no carotid bruits. Back is nontender and there is no CVA tenderness. Lungs are clear without rales, wheezes, or rhonchi. Chest is nontender. Heart has regular rate and rhythm without murmur. Abdomen is soft, flat, nontender without masses or hepatosplenomegaly and peristalsis is normoactive. Extremities have no cyanosis or edema, full range of motion is present. Skin is warm  and dry without rash. Neurologic: Mental status is normal, cranial nerves are intact, there are no motor or sensory deficits.  ED Treatments / Results  Labs (all labs ordered are listed, but only abnormal results are displayed) Labs Reviewed  BASIC METABOLIC PANEL - Abnormal; Notable for the following components:      Result Value   Potassium 3.3 (*)    All other components within normal limits  CBC - Abnormal; Notable for the following components:   Hemoglobin 11.4 (*)    HCT 35.6 (*)    MCH 25.1 (*)    All other components within normal limits  URINALYSIS, ROUTINE W REFLEX MICROSCOPIC - Abnormal; Notable for the following components:   Color, Urine STRAW (*)    Hgb urine dipstick  SMALL (*)    All other components within normal limits  CBG MONITORING, ED    EKG  EKG Interpretation  Date/Time:  Saturday July 01 2017 00:23:01 EST Ventricular Rate:  82 PR Interval:    QRS Duration: 98 QT Interval:  391 QTC Calculation: 457 R Axis:   65 Text Interpretation:  Sinus rhythm Probable left atrial enlargement Left ventricular hypertrophy Anterior Q waves, possibly due to LVH Early repolarization When compared with ECG of 01/31/2017, No significant change was found Confirmed by Dione Booze ([id number]) on 07/01/2017 12:27:14 AM       Radiology Ct Head Wo Contrast  Result Date: 07/01/2017 CLINICAL DATA:  51 y/o M; multiple syncopal episodes with head injury. EXAM: CT HEAD WITHOUT CONTRAST CT CERVICAL SPINE WITHOUT CONTRAST TECHNIQUE: Multidetector CT imaging of the head and cervical spine was performed following the standard protocol without intravenous contrast. Multiplanar CT image reconstructions of the cervical spine were also generated. COMPARISON:  None. FINDINGS: CT HEAD FINDINGS Brain: No evidence of acute infarction, hemorrhage, hydrocephalus, extra-axial collection or mass lesion/mass effect. Vascular: Mild calcific atherosclerosis of carotid siphons. No hyperdense vessel.  Skull: Normal. Negative for fracture or focal lesion. Sinuses/Orbits: Right mastoid tip opacification and sclerosis. Mild right sphenoid sinus mucosal thickening. Otherwise negative. Other: None. CT CERVICAL SPINE FINDINGS Alignment: Straightening of cervical lordosis without listhesis. Skull base and vertebrae: No acute fracture. No primary bone lesion or focal pathologic process. C5-6 anterior cervical discectomy and fusion, no periprosthetic lucency or fracture. 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 Soft tissues and spinal canal: No prevertebral fluid or swelling. No visible canal hematoma. Disc levels: Mild multilevel discogenic degenerative changes. No high-grade bony foraminal or canal stenosis. Upper chest: Paraseptal emphysema of lung apices. Other: Mild calcific atherosclerosis of carotid siphons. IMPRESSION: 1. Negative CT of the head. 2. No acute fracture or dislocation of cervical spine. 3. C5-6 anterior cervical discectomy and fusion, no apparent hardware related complication. 4. Paraseptal emphysema of lung apices. Electronically Signed   By: Mitzi Hansen M.D.   On: 07/01/2017 06:18   Ct Cervical Spine Wo Contrast  Result Date: 07/01/2017 CLINICAL DATA:  51 y/o M; multiple syncopal episodes with head injury. EXAM: CT HEAD WITHOUT CONTRAST CT CERVICAL SPINE WITHOUT CONTRAST TECHNIQUE: Multidetector CT imaging of the head and cervical spine was performed following the standard protocol without intravenous contrast. Multiplanar CT image reconstructions of the cervical spine were also generated. COMPARISON:  None. FINDINGS: CT HEAD FINDINGS Brain: No evidence of acute infarction, hemorrhage, hydrocephalus, extra-axial collection or mass lesion/mass effect. Vascular: Mild calcific atherosclerosis of carotid siphons. No hyperdense vessel. Skull: Normal. Negative for fracture or focal lesion. Sinuses/Orbits: Right mastoid tip opacification and sclerosis. Mild right  sphenoid sinus mucosal thickening. Otherwise negative. Other: None. CT CERVICAL SPINE FINDINGS Alignment: Straightening of cervical lordosis without listhesis. Skull base and vertebrae: No acute fracture. No primary bone lesion or focal pathologic process. C5-6 anterior cervical discectomy and fusion, no periprosthetic lucency or fracture. 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 Soft tissues and spinal canal: No prevertebral fluid or swelling. No visible canal hematoma. Disc levels: Mild multilevel discogenic degenerative changes. No high-grade bony foraminal or canal stenosis. Upper chest: Paraseptal emphysema of lung apices. Other: Mild calcific atherosclerosis of carotid siphons. IMPRESSION: 1. Negative CT of the head. 2. No acute fracture or dislocation of cervical spine. 3. C5-6 anterior cervical discectomy and fusion, no apparent hardware related complication. 4. Paraseptal emphysema of lung apices.  Electronically Signed   By: Mitzi Hansen M.D.   On: 07/01/2017 06:18    Procedures Procedures (including critical care time)  Medications Ordered in ED Medications  potassium chloride SA (K-DUR,KLOR-CON) CR tablet 40 mEq (40 mEq Oral Given 07/01/17 0435)     Initial Impression / Assessment and Plan / ED Course  I have reviewed the triage vital signs and the nursing notes.  Pertinent labs & imaging results that were available during my care of the patient were reviewed by me and considered in my medical decision making (see chart for details).  Syncope which historically sounds like micturition syncope.  Doubt seizure.  Doubt Stokes-Adams attack.  No red flags to suggest more serious etiology.  Screening labs do show mild hypokalemia which is presumably secondary to recent addition of hydrochlorothiazide to his regimen.  He is given oral potassium and will need to be maintained on oral potassium as an outpatient.  Also, mild anemia is present which is unchanged from  baseline.  Because he hit his head, he will be sent for CT of head and cervical spine.  CT scans show no evidence of acute injury.  Orthostatic vital signs do show a borderline significant drop in blood pressure and rise in heart rate.  I suspect this is related to recent institution of diuretic at a moderate dose.  Mild anemia is noted which is not clinically significant.  At this point, I do not see any need for inpatient care.  He is discharged with prescription for potassium and also, his HCTZ dose is decreased from 25 mg a day to 12.5 mg a day.  He is encouraged to monitor his blood pressures at home.  Follow-up with his PCP in 2 weeks.  Final Clinical Impressions(s) / ED Diagnoses   Final diagnoses:  Syncope and collapse  Diuretic-induced hypokalemia  Normocytic anemia    ED Discharge Orders        Ordered    hydrochlorothiazide (HYDRODIURIL) 12.5 MG tablet  Daily     07/01/17 0713    potassium chloride SA (K-DUR,KLOR-CON) 20 MEQ tablet     07/01/17 0981       Dione Booze, MD 07/01/17 [phone]

## 2017-07-01 NOTE — ED Notes (Signed)
Patient provided with urinal and made aware of need for sample

## 2017-07-06 ENCOUNTER — Ambulatory Visit (INDEPENDENT_AMBULATORY_CARE_PROVIDER_SITE_OTHER): Payer: Medicaid Other | Admitting: Physician Assistant

## 2017-07-12 ENCOUNTER — Ambulatory Visit (INDEPENDENT_AMBULATORY_CARE_PROVIDER_SITE_OTHER): Payer: Medicaid Other | Admitting: Physician Assistant

## 2017-07-14 ENCOUNTER — Telehealth: Payer: Self-pay | Admitting: *Deleted

## 2017-07-14 NOTE — Telephone Encounter (Signed)
-----   Message from Loletta Specter, PA-C sent at 06/23/2017 12:49 PM EST ----- Elevated LDL. I will send statin to [facility] pharmacy.

## 2017-07-14 NOTE — Telephone Encounter (Signed)
Medical Assistant left message on patient's home and cell voicemail. Voicemail states to give a call back to [place] with [facility] at [phone]. Patient is aware of cholesterol being elevated and a medication being sent to the pharmacy to take daily.

## 2017-07-21 ENCOUNTER — Ambulatory Visit (INDEPENDENT_AMBULATORY_CARE_PROVIDER_SITE_OTHER): Payer: Medicaid Other | Admitting: Physician Assistant

## 2017-08-14 ENCOUNTER — Ambulatory Visit (INDEPENDENT_AMBULATORY_CARE_PROVIDER_SITE_OTHER): Payer: Self-pay | Admitting: Physician Assistant

## 2017-09-05 ENCOUNTER — Ambulatory Visit (INDEPENDENT_AMBULATORY_CARE_PROVIDER_SITE_OTHER): Payer: Medicaid Other | Admitting: Orthopaedic Surgery

## 2017-09-05 ENCOUNTER — Telehealth (INDEPENDENT_AMBULATORY_CARE_PROVIDER_SITE_OTHER): Payer: Self-pay

## 2017-09-05 ENCOUNTER — Ambulatory Visit (INDEPENDENT_AMBULATORY_CARE_PROVIDER_SITE_OTHER): Payer: Medicaid Other

## 2017-09-05 DIAGNOSIS — Z96641 Presence of right artificial hip joint: Secondary | ICD-10-CM

## 2017-09-05 MED ORDER — TIZANIDINE HCL 4 MG PO TABS: 4.0000 mg | ORAL_TABLET | Freq: Three times a day (TID) | ORAL | 0 refills | Status: DC | PRN

## 2017-09-05 MED ORDER — METHYLPREDNISOLONE 4 MG PO TABS: ORAL_TABLET | ORAL | 0 refills | Status: DC

## 2017-09-05 MED ORDER — NABUMETONE 500 MG PO TABS: 500.0000 mg | ORAL_TABLET | Freq: Two times a day (BID) | ORAL | 2 refills | Status: DC | PRN

## 2017-09-05 MED ORDER — ACETAMINOPHEN-CODEINE #3 300-30 MG PO TABS: 1.0000 | ORAL_TABLET | Freq: Three times a day (TID) | ORAL | 0 refills | Status: DC | PRN

## 2017-09-05 NOTE — Progress Notes (Signed)
The patient is now between 8 and 9 months status post a right total hip arthroplasty through direct anterior approach.  This was to treat avascular necrosis.  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  He is exhibiting right hip pain still.  He feels like there is "something wrong".  His pain is mainly in the groin.  His left hip is still does not have severe pain.  On exam I can put both hips the range of motion and the pain does not seem to be severe.  He does have some guarding on the right hip with rotation.  His leg lengths are equal.  His incision is well-healed.  No evidence of infection.  I cannot palpate any lymph nodes around his groin area.  X-rays of his pelvis and right hip show well-seated implant with no complicating features.  There is a large area of avascular necrosis with cystic changes in the femoral head on the left side.  At this point I want to try a 6-day steroid taper as well as Relafen, Zanaflex and Tylenol No. 3.  I want him to work on activity modification and even suggest he take a few weeks off of work but he says he is not able to do that.  I will see him back in 3 months to see how he is doing overall but no x-rays are needed.  He understands at some point if his left hip pain gets severe enough that we would recommend a hip replacement on that side as well.

## 2017-09-05 NOTE — Telephone Encounter (Signed)
Medicaid will not cover Nabumetone, we need to send in another NSAID please to his [facility]

## 2017-09-06 ENCOUNTER — Other Ambulatory Visit (INDEPENDENT_AMBULATORY_CARE_PROVIDER_SITE_OTHER): Payer: Self-pay

## 2017-09-06 ENCOUNTER — Ambulatory Visit (INDEPENDENT_AMBULATORY_CARE_PROVIDER_SITE_OTHER): Payer: Self-pay | Admitting: Orthopaedic Surgery

## 2017-09-06 MED ORDER — MELOXICAM 7.5 MG PO TABS: 7.5000 mg | ORAL_TABLET | Freq: Two times a day (BID) | ORAL | 1 refills | Status: DC | PRN

## 2017-09-06 NOTE — Telephone Encounter (Signed)
Called into pharmacy

## 2017-09-06 NOTE — Telephone Encounter (Signed)
Meloxicam 7.5 mg twice daily as needed, #60

## 2017-12-04 ENCOUNTER — Ambulatory Visit (INDEPENDENT_AMBULATORY_CARE_PROVIDER_SITE_OTHER): Payer: Medicaid Other | Admitting: Orthopaedic Surgery

## 2017-12-11 MED FILL — HYDROCHLOROTHIAZIDE 25 MG T: 25 | 30 days supply | Qty: 30 | Fill #1

## 2017-12-11 MED FILL — LOSARTAN POTASSIUM 100 MG T: 100 | 30 days supply | Qty: 30 | Fill #1

## 2017-12-11 MED FILL — AMLODIPINE BESYLATE 10 MG T: 10 | 30 days supply | Qty: 30 | Fill #1

## 2017-12-26 ENCOUNTER — Encounter (INDEPENDENT_AMBULATORY_CARE_PROVIDER_SITE_OTHER): Payer: Self-pay | Admitting: Orthopaedic Surgery

## 2017-12-26 ENCOUNTER — Ambulatory Visit (INDEPENDENT_AMBULATORY_CARE_PROVIDER_SITE_OTHER): Payer: Self-pay | Admitting: Orthopaedic Surgery

## 2017-12-26 DIAGNOSIS — M87052 Idiopathic aseptic necrosis of left femur: Secondary | ICD-10-CM

## 2017-12-26 DIAGNOSIS — M87051 Idiopathic aseptic necrosis of right femur: Secondary | ICD-10-CM

## 2017-12-26 DIAGNOSIS — Z96641 Presence of right artificial hip joint: Secondary | ICD-10-CM

## 2017-12-26 MED ORDER — ACETAMINOPHEN-CODEINE #3 300-30 MG PO TABS: 1.0000 | ORAL_TABLET | Freq: Three times a day (TID) | ORAL | 0 refills | Status: DC | PRN

## 2017-12-26 MED ORDER — TIZANIDINE HCL 4 MG PO TABS: 4.0000 mg | ORAL_TABLET | Freq: Three times a day (TID) | ORAL | 0 refills | Status: DC | PRN
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
## 2018-03-28 ENCOUNTER — Encounter (INDEPENDENT_AMBULATORY_CARE_PROVIDER_SITE_OTHER): Payer: Self-pay | Admitting: Orthopaedic Surgery

## 2018-03-28 ENCOUNTER — Ambulatory Visit (INDEPENDENT_AMBULATORY_CARE_PROVIDER_SITE_OTHER): Payer: Self-pay

## 2018-03-28 ENCOUNTER — Ambulatory Visit (INDEPENDENT_AMBULATORY_CARE_PROVIDER_SITE_OTHER): Payer: Self-pay | Admitting: Orthopaedic Surgery

## 2018-03-28 DIAGNOSIS — Z96641 Presence of right artificial hip joint: Secondary | ICD-10-CM

## 2018-03-28 NOTE — Progress Notes (Signed)
The patient is well-known to me.  He is 15 months status post a right total hip arthroplasty through anterior approach.  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  He says he gets a lot of pain in his right hip area which he thinks is mainly from the job standing on concrete and lifting 50 pounds constantly all day long.  He is a very thin individual.  On exam he walks with a slight limp.  I can easily put his right hip to internal extra rotation with no complicating features of this at all and no blocks to rotation but he still does hurt in general.  An AP pelvis and lateral right hip shows a well-seated total hip arthroplasty that appears bone ingrown with no complicating features.  He would like for me to take him out of work and I agree with this for the short-term.  I gave him a note to keep him out of work for the next 3 weeks until September 30.  He will also use a cane in the opposite hand hopefully this will allow him to rest his hip.  I will reevaluate him in 4 weeks but no x-rays are needed.  All question concerns were answered and addressed.

## 2018-03-30 MED FILL — AMLODIPINE BESYLATE 10 MG T: 10 | 30 days supply | Qty: 30 | Fill #2

## 2018-03-30 MED FILL — HYDROCHLOROTHIAZIDE 25 MG T: 25 | 30 days supply | Qty: 30 | Fill #2

## 2018-03-30 MED FILL — LOSARTAN POTASSIUM 100 MG T: 100 | 30 days supply | Qty: 30 | Fill #2

## 2018-04-02 ENCOUNTER — Ambulatory Visit (INDEPENDENT_AMBULATORY_CARE_PROVIDER_SITE_OTHER): Payer: Self-pay | Admitting: Physician Assistant

## 2018-04-15 ENCOUNTER — Emergency Department (HOSPITAL_COMMUNITY)
Admission: EM | Admit: 2018-04-15 | Discharge: 2018-04-15 | Disposition: A | Discharge status: Home / Self Care | Payer: Self-pay | Attending: Emergency Medicine | Admitting: Emergency Medicine

## 2018-04-15 ENCOUNTER — Other Ambulatory Visit: Payer: Self-pay

## 2018-04-15 DIAGNOSIS — Z7982 Long term (current) use of aspirin: Secondary | ICD-10-CM | POA: Insufficient documentation

## 2018-04-15 DIAGNOSIS — Z96641 Presence of right artificial hip joint: Secondary | ICD-10-CM | POA: Insufficient documentation

## 2018-04-15 DIAGNOSIS — K0889 Other specified disorders of teeth and supporting structures: Secondary | ICD-10-CM | POA: Insufficient documentation

## 2018-04-15 DIAGNOSIS — R590 Localized enlarged lymph nodes: Secondary | ICD-10-CM | POA: Insufficient documentation

## 2018-04-15 DIAGNOSIS — F17210 Nicotine dependence, cigarettes, uncomplicated: Secondary | ICD-10-CM | POA: Insufficient documentation

## 2018-04-15 DIAGNOSIS — I10 Essential (primary) hypertension: Secondary | ICD-10-CM | POA: Insufficient documentation

## 2018-04-15 DIAGNOSIS — Z79899 Other long term (current) drug therapy: Secondary | ICD-10-CM | POA: Insufficient documentation

## 2018-04-15 MED ORDER — HYDROCODONE-ACETAMINOPHEN 5-325 MG PO TABS
1.0000 | ORAL_TABLET | Freq: Once | ORAL | Status: AC
  Administered 2018-04-15: 1 via ORAL
  Filled 2018-04-15: qty 1

## 2018-04-15 MED ORDER — PENICILLIN V POTASSIUM 250 MG PO TABS
500.0000 mg | ORAL_TABLET | Freq: Once | ORAL | Status: AC
  Administered 2018-04-15: 500 mg via ORAL
  Filled 2018-04-15: qty 2

## 2018-04-15 MED ORDER — PENICILLIN V POTASSIUM 500 MG PO TABS
1000.0000 mg | ORAL_TABLET | Freq: Two times a day (BID) | ORAL | 0 refills | Status: DC
Start: 2018-04-15 — End: 2018-06-14

## 2018-04-15 NOTE — ED Triage Notes (Addendum)
Patient c/o right sided neck pain x-3 days. Denies injury or ear pain. States that it wakes him up at night. Also c/o upper tooth pain (intermittent).

## 2018-04-15 NOTE — ED Provider Notes (Signed)
[facility] EMERGENCY DEPARTMENT Provider Note   CSN: [id number] Arrival date & time: 04/15/18  0007     History   Chief Complaint Chief Complaint  Patient presents with  . Neck Pain    HPI Dennis Harris is a 52 y.o. male with a hx of AVN, HTN, RA, cardiomyopathy, cervical disc rupture and fusion presents to the Emergency Department complaining of gradual, intermittent, progressively worsening right sided neck pain onset 2-3 days ago. Associated symptoms include mild swelling at the site and dental pain which developed today.  Pt took 1 dose of amoxicillin and ibuprofen PTA which has improved his pain.  He reports pain with turning his neck.  Pt denies fever, chills, headache, chest pain, SOB, abd pain, N/V/D, weakness, dizziness.  Pt also denies difficulty breathing or swallowing. No fever, chills, night sweats or weight loss.  Hx of tonsillectomy as a child.     The history is provided by the patient, the spouse and medical records. No language interpreter was used.    Past Medical History:  Diagnosis Date  . 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 Exertional shortness of breath    "at times" (11/14/2012)  . Hypercholesteremia   . Hypertension   . Pericarditis ~ 2009  . Pneumonia   . Rheumatoid arthritis(714.0)     Patient Active Problem List   Diagnosis Date Noted  . Avascular necrosis of hip, right (HCC) 12/13/2016  . 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 Avascular necrosis of bones of both hips (HCC) 12/07/2016  . Pain of left hip joint 12/07/2016  . Pain of right hip joint 12/07/2016  . SIRS (systemic inflammatory response syndrome) (HCC) 08/07/2014  . Cardiomyopathy- EF 40-45% presumed secondary to acute illness 08/07/2014  . CAP (community acquired pneumonia)   . Right upper quadrant pain   . Acute pericarditis 08/04/2014  . Acute hyponatremia 08/04/2014  . Protein-calorie malnutrition, severe (HCC)  08/04/2014  . Sepsis (HCC) 08/03/2014  . Legionella pneumonia (HCC) 08/03/2014  . Rheumatoid arthritis (HCC) 08/03/2014  . Chest pain 08/03/2014  . Elevated troponin- not fel to be secondary to MI 08/03/2014  . Nausea vomiting and diarrhea 08/03/2014  . Cervical spondylosis without myelopathy 11/06/2012    Past Surgical History:  Procedure Laterality Date  . ANTERIOR CERVICAL DECOMP/DISCECTOMY FUSION N/A 11/14/2012   Procedure: ANTERIOR CERVICAL DECOMPRESSION/DISCECTOMY FUSION 1 LEVEL C5-6;  Surgeon: Venita Lick, MD;  Location: [facility] OR;  Service: Orthopedics;  Laterality: N/A;  . APPENDECTOMY  1970's  . BACK SURGERY    . CARDIAC CATHETERIZATION  May 2010   normal  . JOINT REPLACEMENT    . TONSILLECTOMY  1970's?  . 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 TOTAL HIP ARTHROPLASTY Right 12/13/2016   Procedure: RIGHT TOTAL HIP ARTHROPLASTY ANTERIOR APPROACH;  Surgeon: Kathryne Hitch, MD;  Location: [facility] OR;  Service: Orthopedics;  Laterality: Right;        Home Medications    Prior to Admission medications   Medication Sig Start Date End Date Taking? Authorizing Provider  acetaminophen-codeine (TYLENOL #3) 300-30 MG tablet Take 1-2 tablets by mouth every 8 (eight) hours as needed for moderate pain. 12/26/17   Kathryne Hitch, MD  amLODipine (NORVASC) 10 MG tablet Take 1 tablet (10 mg total) by mouth daily. 06/22/17   Loletta Specter, PA-C  aspirin 81 MG chewable tablet Chew 1 tablet (81 mg total) by mouth 2 (two) times daily.  06/22/17   Loletta Specter, PA-C  atorvastatin (LIPITOR) 40 MG tablet Take 1 tablet (40 mg total) by mouth daily. 06/23/17   Loletta Specter, PA-C  hydrochlorothiazide (HYDRODIURIL) 12.5 MG tablet Take 2 tablets (25 mg total) by mouth daily. Take on tablet in the morning. 07/01/17   Dione Booze, MD  HYDROcodone-acetaminophen (NORCO) 5-325 MG tablet Take 1 tablet every 6 (six) hours as needed by mouth for moderate pain. One to two tabs every  4-6 hours for pain 06/12/17   Kirtland Bouchard, PA-C  HYDROCORTISONE EX Apply 1 application topically 2 (two) times daily as needed (for razor bumps/rash/itching.).    [provider]  losartan (COZAAR) 100 MG tablet Take 1 tablet (100 mg total) by mouth daily. 06/22/17   Loletta Specter, PA-C  meloxicam (MOBIC) 7.5 MG tablet Take 1 tablet (7.5 mg total) by mouth 2 (two) times daily as needed for pain. 09/06/17   Kathryne Hitch, MD  methylPREDNISolone (MEDROL) 4 MG tablet Medrol dose pack. Take as instructed 09/05/17   Kathryne Hitch, MD  nabumetone (RELAFEN) 500 MG tablet Take 1 tablet (500 mg total) by mouth 2 (two) times daily as needed. 09/05/17   Kathryne Hitch, MD  Omega-3 Fatty Acids (FISH OIL) 1000 MG CAPS Take 1,000 mg by mouth daily.    [provider]  penicillin v potassium (VEETID) 500 MG tablet Take 2 tablets (1,000 mg total) by mouth 2 (two) times daily. X 7 days 04/15/18   Evelynn Hench, Dahlia Client, PA-C  potassium chloride SA (K-DUR,KLOR-CON) 20 MEQ tablet Take one tablet twice a day for five days, then take one tablet a day. 07/01/17   Dione Booze, MD  tiZANidine (ZANAFLEX) 4 MG tablet Take 1 tablet (4 mg total) by mouth every 8 (eight) hours as needed for muscle spasms. 12/26/17   Kathryne Hitch, MD    Family History Family History  Problem Relation Age of Onset  . Cirrhosis Father   . Lupus Sister   . Lupus Sister   . Hypertension Mother   . Stroke Mother   . Diabetes Mother     Social History Social History   Tobacco Use  . Smoking status: Current Every Day Smoker    Packs/day: 0.50    Years: 37.00    Pack years: 18.50    Types: Cigarettes  . Smokeless tobacco: Never Used  Substance Use Topics  . Alcohol use: Yes    Alcohol/week: 14.0 standard drinks    Types: 14 Cans of beer per week    Comment: 2-3 12 ounce cans of beer per day  . Drug use: Yes    Types: Marijuana    Comment: 12/13/2016 "nothing in years"      Allergies   Bee venom and Shellfish allergy   Review of Systems Review of Systems  Constitutional: Negative for appetite change, diaphoresis, fatigue, fever and unexpected weight change.  HENT: Positive for dental problem. Negative for mouth sores.   Eyes: Negative for visual disturbance.  Respiratory: Negative for cough, chest tightness, shortness of breath and wheezing.   Cardiovascular: Negative for chest pain.  Gastrointestinal: Negative for abdominal pain, constipation, diarrhea, nausea and vomiting.  Endocrine: Negative for polydipsia, polyphagia and polyuria.  Genitourinary: Negative for dysuria, frequency, hematuria and urgency.  Musculoskeletal: Negative for back pain and neck stiffness.  Skin: Negative for rash.  Allergic/Immunologic: Negative for immunocompromised state.  Neurological: Negative for syncope, light-headedness and headaches.  Hematological: Positive for adenopathy. Does not bruise/bleed  easily.  Psychiatric/Behavioral: Negative for sleep disturbance. The patient is not nervous/anxious.      Physical Exam Updated Vital Signs BP (!) 149/84   Pulse 72   Temp 97.6 F (36.4 C) (Oral)   Resp 16   Ht 5\' 10"  (1.778 m)   Wt 72.6 kg   SpO2 97%   BMI 22.96 kg/m   Physical Exam  Constitutional: He appears well-developed and well-nourished.  HENT:  Head: Normocephalic.  Right Ear: Tympanic membrane, external ear and ear canal normal.  Left Ear: Tympanic membrane, external ear and ear canal normal.  Nose: Nose normal. Right sinus exhibits no maxillary sinus tenderness and no frontal sinus tenderness. Left sinus exhibits no maxillary sinus tenderness and no frontal sinus tenderness.  Mouth/Throat: Uvula is midline, oropharynx is clear and moist and mucous membranes are normal. No oral lesions. Abnormal dentition. Dental caries present. No uvula swelling or lacerations. No oropharyngeal exudate, posterior oropharyngeal edema, posterior oropharyngeal  erythema or tonsillar abscesses.  Poor dentition throughout No gross abscess No fluctuance or induration to the buccal mucosa or sublingual space  Eyes: Pupils are equal, round, and reactive to light. Conjunctivae are normal. Right eye exhibits no discharge. Left eye exhibits no discharge.  Neck: Trachea normal, normal range of motion and full passive range of motion without pain. Neck supple. No tracheal tenderness, no spinous process tenderness and no muscular tenderness present. No neck rigidity. No erythema and normal range of motion present. No thyroid mass and no thyromegaly present.  No stridor Handling secretions without difficulty No nuchal rigidity No cervical lymphadenopathy Normal phonation  Cardiovascular: Normal rate and regular rhythm.  Pulmonary/Chest: Effort normal. No respiratory distress.  Equal chest rise  Abdominal: Soft. He exhibits no distension.  Lymphadenopathy:       Head (right side): Submandibular ( tender and slightly larger than the left) adenopathy present. No submental, no tonsillar, no preauricular, no posterior auricular and no occipital adenopathy present.       Head (left side): Submandibular adenopathy present. No submental, no tonsillar, no preauricular, no posterior auricular and no occipital adenopathy present.    He has no cervical adenopathy.  Neurological: He is alert.  Skin: Skin is warm and dry.  Psychiatric: He has a normal mood and affect.  Nursing note and vitals reviewed.    ED Treatments / Results   Procedures Procedures (including critical care time)  Medications Ordered in ED Medications  penicillin v potassium (VEETID) tablet 500 mg (has no administration in time range)  HYDROcodone-acetaminophen (NORCO/VICODIN) 5-325 MG per tablet 1 tablet (has no administration in time range)     Initial Impression / Assessment and Plan / ED Course  I have reviewed the triage vital signs and the nursing notes.  Pertinent labs & imaging  results that were available during my care of the patient were reviewed by me and considered in my medical decision making (see chart for details).     Patient with lymphadenopathy of the right submandibular node with tenderness.  Lymph node is discrete, mobile.  Mild lymphadenopathy of the left submandibular node without tenderness.  Poor dentition throughout and patient does have dental pain.  No evidence of Ludwig's angina or retropharyngeal abscess.  Patient is afebrile with full range of motion of the neck.  Doubt meningitis.  Patient speaking and swallowing without difficulty.  No stridor. He will be given penicillin.  He will have ibuprofen or Tylenol for pain.  Patient does not have night sweats or weight loss to  suggest malignant origin of lymphadenopathy.  Discussed reasons to return immediately to the emergency department and close follow-up with dentist.  Patient and wife state understanding and are in agreement with the plan.  Final Clinical Impressions(s) / ED Diagnoses   Final diagnoses:  Lymphadenopathy, submandibular  Pain, dental    ED Discharge Orders         Ordered    penicillin v potassium (VEETID) 500 MG tablet  2 times daily     04/15/18 0151           Kelso Bibby, Boyd Kerbs 04/15/18 0157    Dione Booze, MD 04/15/18 [phone]

## 2018-04-15 NOTE — ED Notes (Signed)
Pt c/o rt neck pain  Rt lower tooth pain today  He took 2 amoxicillin pills that was his daughters and 3 ibuprofen  Feeling a little better now.  No known injury

## 2018-04-15 NOTE — Discharge Instructions (Signed)
1. Medications: alternate ibuprofen and tylenol, penicillin, usual home medications 2. Treatment: rest, drink plenty of fluids, take medications as prescribed 3. Follow Up: Please followup with dentistry within 1 week for discussion of your diagnoses and further evaluation after today's visit; if you do not have a primary care doctor use the resource guide provided to find one; Return to the ER for high fevers, difficulty breathing, difficulty swallowing or other concerning symptoms

## 2018-04-25 ENCOUNTER — Ambulatory Visit (INDEPENDENT_AMBULATORY_CARE_PROVIDER_SITE_OTHER): Payer: Self-pay | Admitting: Orthopaedic Surgery

## 2018-04-25 ENCOUNTER — Encounter (INDEPENDENT_AMBULATORY_CARE_PROVIDER_SITE_OTHER): Payer: Self-pay | Admitting: Orthopaedic Surgery

## 2018-04-25 DIAGNOSIS — Z96641 Presence of right artificial hip joint: Secondary | ICD-10-CM

## 2018-04-25 NOTE — Progress Notes (Signed)
The patient is now 16 months status post a right total hip arthroplasty to treat end-stage avascular necrosis.  He is a thin 52 year old individual and is back to his full work.  However his job does involve standing and working and walking on concrete all day long.  He has a frequent lift objects greater than 50 pounds.  He says this is causing him to still have pain around his hip area.  On exam he walks without a limp.  Examination of his right hip shows full and fluid range of motion with no pain at all.  Compressing the hip causes no pain.  His leg lengths are equal.  He has a benign exam on his left hip with no pain at all.  His back exam is normal.  X-rays from last month of his pelvis shows avascular necrosis on the left side but no femoral head collapse and this appears to be sclerotic like the swelling itself often correlates with a normal clinical exam on the left hip.  His right total hip arthroplasty is well-seated with no complicating features.  At this point I will give him a note per his wishes to keep him out of work the next 6 weeks to allow him to completely rest his hip.  I told him that he likely needs a look for employment that is not is heavy duty as his and that is what he says he is going to try to do as well.  All question concerns were answered and addressed.  Follow-up will be as needed.

## 2018-06-01 ENCOUNTER — Ambulatory Visit: Payer: Self-pay

## 2018-06-01 ENCOUNTER — Encounter

## 2018-06-05 ENCOUNTER — Ambulatory Visit (INDEPENDENT_AMBULATORY_CARE_PROVIDER_SITE_OTHER): Payer: Self-pay | Admitting: Orthopaedic Surgery

## 2018-06-05 ENCOUNTER — Encounter (INDEPENDENT_AMBULATORY_CARE_PROVIDER_SITE_OTHER): Payer: Self-pay | Admitting: Orthopaedic Surgery

## 2018-06-05 DIAGNOSIS — Z96641 Presence of right artificial hip joint: Secondary | ICD-10-CM

## 2018-06-05 DIAGNOSIS — M25552 Pain in left hip: Secondary | ICD-10-CM

## 2018-06-05 NOTE — Progress Notes (Signed)
The patient is now 18 months status post a right total hip arthroplasty there is a performed due to avascular necrosis of his right hip.  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  When I saw him at his last visit his x-rays look normal of his right hip and his right hip exam did not show significant problems at all.  However lifting types of activities cause him severe enough pain that we kept him out of work completely for the last 6 weeks.  He says he is doing great until he lifted a laundry basket last week and developed right hip pain again.  On exam he is a thin individual.  His right hip exam is normal today.  His left hip moves smoothly normally with no pain in light of having a nidus of AVN.  At this point he will stay out of heavy manual labor type for the first year.  I agree with this and he may consider other employment opportunities that are less stressful to his body.  I gave him reassurance that hip replacement is doing well and seems to be doing well on my exam and from x-rays as well.  I gave him a note to keep him out of work until January 6.  He will follow-up as needed.

## 2018-06-07 ENCOUNTER — Encounter

## 2018-06-14 ENCOUNTER — Encounter (INDEPENDENT_AMBULATORY_CARE_PROVIDER_SITE_OTHER): Payer: Self-pay | Admitting: Physician Assistant

## 2018-06-14 ENCOUNTER — Ambulatory Visit (INDEPENDENT_AMBULATORY_CARE_PROVIDER_SITE_OTHER): Payer: Self-pay | Admitting: Physician Assistant

## 2018-06-14 ENCOUNTER — Other Ambulatory Visit: Payer: Self-pay

## 2018-06-14 VITALS — BP 178/95 | HR 90 | Temp 97.9°F | Ht 70.0 in | Wt 152.4 lb

## 2018-06-14 DIAGNOSIS — M2550 Pain in unspecified joint: Secondary | ICD-10-CM

## 2018-06-14 DIAGNOSIS — Z79899 Other long term (current) drug therapy: Secondary | ICD-10-CM

## 2018-06-14 DIAGNOSIS — Z8739 Personal history of other diseases of the musculoskeletal system and connective tissue: Secondary | ICD-10-CM

## 2018-06-14 DIAGNOSIS — N529 Male erectile dysfunction, unspecified: Secondary | ICD-10-CM

## 2018-06-14 DIAGNOSIS — I10 Essential (primary) hypertension: Secondary | ICD-10-CM

## 2018-06-14 MED ORDER — SILDENAFIL CITRATE 20 MG PO TABS: 20.0000 mg | ORAL_TABLET | Freq: Every day | ORAL | 0 refills | Status: DC

## 2018-06-14 MED ORDER — HYDROCHLOROTHIAZIDE 12.5 MG PO TABS: 25.0000 mg | ORAL_TABLET | Freq: Every day | ORAL | 1 refills | Status: DC

## 2018-06-14 MED ORDER — ACETAMINOPHEN-CODEINE #3 300-30 MG PO TABS: 2.0000 | ORAL_TABLET | Freq: Three times a day (TID) | ORAL | 0 refills | Status: AC | PRN

## 2018-06-14 MED ORDER — AMLODIPINE BESYLATE 10 MG PO TABS: 10.0000 mg | ORAL_TABLET | Freq: Every day | ORAL | 1 refills | Status: DC

## 2018-06-14 MED ORDER — LOSARTAN POTASSIUM 100 MG PO TABS: 100.0000 mg | ORAL_TABLET | Freq: Every day | ORAL | 1 refills | Status: DC

## 2018-06-14 MED FILL — AMLODIPINE BESYLATE 10 MG T: 10 | 30 days supply | Qty: 30 | Fill #0

## 2018-06-14 MED FILL — HYDROCHLOROTHIAZIDE 12.5 MG: 12.5 | 30 days supply | Qty: 60 | Fill #0

## 2018-06-14 MED FILL — SILDENAFIL 20 MG TABLET: 20 | 30 days supply | Qty: 10 | Fill #0

## 2018-06-14 MED FILL — ACETAMINOPHEN/COD #3 TABLET: 300-30 | 7 days supply | Qty: 42 | Fill #0

## 2018-06-14 MED FILL — LOSARTAN POTASSIUM 100 MG T: 100 | 30 days supply | Qty: 30 | Fill #0

## 2018-06-14 NOTE — Progress Notes (Signed)
Subjective:  Patient ID: Dennis Harris, male    DOB: 1965-08-07  Age: 52 y.o. MRN: [id number]  CC: HTN and arthritis  HPI Dennis L Arringtonis a 51 y.o.malewith a PMH of Avascular Necrosis, HTN, hypercholesterolemia, RA, and pericarditis presents for f/u of HTN. Last seen here nearly one year ago. BP was 169/89 mmHg at last visit. Had not been taking his antihypertensives at the time due to being busy at work and being unaware of availability of refills. BP 178/95 today. Says he is not currently taking HCTZ and needs to pick up refills. Pt states he was previously diagnosed with rheumatoid arthritis and last treated by rheumatology in 2015. Has not been able to return due to lack of insurance. Pt wakes up with pain in the hands, feet, knees, and shoulders. Upon review of labs, there is no lab work to support diagnosis of RA. Lastly, pt is having erectile dysfunction and seeks treatment. Does not endorse any urinary symptoms, perineal pain, ejaculatory symptoms, or low back pain. Does not endorse any other symptoms or complaints.      Outpatient Medications Prior to Visit  Medication Sig Dispense Refill  . amLODipine (NORVASC) 10 MG tablet Take 1 tablet (10 mg total) by mouth daily. 90 tablet 1  . aspirin 81 MG chewable tablet Chew 1 tablet (81 mg total) by mouth 2 (two) times daily. 90 tablet 3  . HYDROCORTISONE EX Apply 1 application topically 2 (two) times daily as needed (for razor bumps/rash/itching.).    Marland Kitchen losartan (COZAAR) 100 MG tablet Take 1 tablet (100 mg total) by mouth daily. 90 tablet 1  . Omega-3 Fatty Acids (FISH OIL) 1000 MG CAPS Take 1,000 mg by mouth daily.    . hydrochlorothiazide (HYDRODIURIL) 12.5 MG tablet Take 2 tablets (25 mg total) by mouth daily. Take on tablet in the morning. (Patient not taking: Reported on 06/14/2018) 30 tablet 0  . potassium chloride SA (K-DUR,KLOR-CON) 20 MEQ tablet Take one tablet twice a day for five days, then take one tablet a day.  (Patient not taking: Reported on 06/14/2018) 35 tablet 0  . acetaminophen-codeine (TYLENOL #3) 300-30 MG tablet Take 1-2 tablets by mouth every 8 (eight) hours as needed for moderate pain. 40 tablet 0  . atorvastatin (LIPITOR) 40 MG tablet Take 1 tablet (40 mg total) by mouth daily. 90 tablet 3  . HYDROcodone-acetaminophen (NORCO) 5-325 MG tablet Take 1 tablet every 6 (six) hours as needed by mouth for moderate pain. One to two tabs every 4-6 hours for pain (Patient not taking: Reported on 04/25/2018) 30 tablet 0  . meloxicam (MOBIC) 7.5 MG tablet Take 1 tablet (7.5 mg total) by mouth 2 (two) times daily as needed for pain. 60 tablet 1  . methylPREDNISolone (MEDROL) 4 MG tablet Medrol dose pack. Take as instructed 21 tablet 0  . nabumetone (RELAFEN) 500 MG tablet Take 1 tablet (500 mg total) by mouth 2 (two) times daily as needed. 60 tablet 2  . penicillin v potassium (VEETID) 500 MG tablet Take 2 tablets (1,000 mg total) by mouth 2 (two) times daily. X 7 days 28 tablet 0  . tiZANidine (ZANAFLEX) 4 MG tablet Take 1 tablet (4 mg total) by mouth every 8 (eight) hours as needed for muscle spasms. 50 tablet 0   No facility-administered medications prior to visit.      ROS Review of Systems  Constitutional: Negative for chills, fever and malaise/fatigue.  Eyes: Negative for blurred vision.  Respiratory: Negative for shortness  of breath.   Cardiovascular: Negative for chest pain and palpitations.  Gastrointestinal: Negative for abdominal pain and nausea.  Genitourinary: Negative for dysuria and hematuria.       Erectile dysfunction  Musculoskeletal: Positive for joint pain. Negative for myalgias.  Skin: Negative for rash.  Neurological: Negative for tingling and headaches.  Psychiatric/Behavioral: Negative for depression. The patient is not nervous/anxious.     Objective:  BP (!) 178/95 (BP Location: Right Arm, Patient Position: Sitting, Cuff Size: Normal)   Pulse 90   Temp 97.9 F (36.6 C)  (Oral)   Ht 5\' 10"  (1.778 m)   Wt 152 lb 6.4 oz (69.1 kg)   SpO2 99%   BMI 21.87 kg/m   Vitals:   06/14/18 1019 06/14/18 1040  BP: (!) 182/95 (!) 178/95  Pulse: 91 90  Temp: 97.9 F (36.6 C)   TempSrc: Oral   SpO2: 99%   Weight: 152 lb 6.4 oz (69.1 kg)   Height: 5\' 10"  (1.778 m)      Physical Exam  Constitutional: He is oriented to person, place, and time.  Well developed, well nourished, NAD, polite  HENT:  Head: Normocephalic and atraumatic.  Eyes: No scleral icterus.  Neck: Normal range of motion. Neck supple. No thyromegaly present.  Cardiovascular: Normal rate, regular rhythm and normal heart sounds.  Pulmonary/Chest: Effort normal and breath sounds normal.  Musculoskeletal: Normal range of motion. He exhibits no edema or deformity.  Neurological: He is alert and oriented to person, place, and time.  Strength 5/5 throughout  Skin: Skin is warm and dry. No rash noted. No erythema. No pallor.  No edema of extremeties  Psychiatric: He has a normal mood and affect. His behavior is normal. Thought content normal.  Vitals reviewed.    Assessment & Plan:     1. History of rheumatoid arthritis - Rheumatoid factor - CYCLIC CITRUL PEPTIDE ANTIBODY, IGG/IGA  2. Arthralgia of multiple sites - ANA w/Reflex - Sedimentation Rate - C-reactive protein - Begin acetaminophen-codeine (TYLENOL #3) 300-30 MG tablet; Take 2 tablets by mouth every 8 (eight) hours as needed for up to 7 days for moderate pain.  Dispense: 42 tablet; Refill: 0  3. High risk medication use - Will consider use of methotrexate if RA is confirmed through testing.  - Comprehensive metabolic panel - CBC with Differential - PPD; Future - DG Chest 2 View; Future  4. Hypertension, unspecified type - Refill hydrochlorothiazide (HYDRODIURIL) 12.5 MG tablet; Take 2 tablets (25 mg total) by mouth daily. Take on tablet in the morning.  Dispense: 90 tablet; Refill: 1 - Refill losartan (COZAAR) 100 MG tablet;  Take 1 tablet (100 mg total) by mouth daily.  Dispense: 90 tablet; Refill: 1 - Refill amLODipine (NORVASC) 10 MG tablet; Take 1 tablet (10 mg total) by mouth daily.  Dispense: 90 tablet; Refill: 1   Meds ordered this encounter  Medications  . hydrochlorothiazide (HYDRODIURIL) 12.5 MG tablet    Sig: Take 2 tablets (25 mg total) by mouth daily. Take on tablet in the morning.    Dispense:  90 tablet    Refill:  1    Order Specific Question:   Supervising Provider    Answer:   06/16/18 [[id number]]  . losartan (COZAAR) 100 MG tablet    Sig: Take 1 tablet (100 mg total) by mouth daily.    Dispense:  90 tablet    Refill:  1    Order Specific Question:   Supervising Provider  Answer:   Hoy Register [[id number]]  . amLODipine (NORVASC) 10 MG tablet    Sig: Take 1 tablet (10 mg total) by mouth daily.    Dispense:  90 tablet    Refill:  1    Order Specific Question:   Supervising Provider    Answer:   Hoy Register [[id number]]  . acetaminophen-codeine (TYLENOL #3) 300-30 MG tablet    Sig: Take 2 tablets by mouth every 8 (eight) hours as needed for up to 7 days for moderate pain.    Dispense:  42 tablet    Refill:  0    Order Specific Question:   Supervising Provider    Answer:   Hoy Register [[id number]]    Follow-up: No follow-ups on file.   Loletta Specter PA

## 2018-06-15 ENCOUNTER — Ambulatory Visit (HOSPITAL_COMMUNITY)
Admission: RE | Admit: 2018-06-15 | Discharge: 2018-06-15 | Disposition: A | Discharge status: Home / Self Care | Payer: Self-pay | Source: Ambulatory Visit | Attending: Physician Assistant | Admitting: Physician Assistant

## 2018-06-15 ENCOUNTER — Telehealth (INDEPENDENT_AMBULATORY_CARE_PROVIDER_SITE_OTHER): Payer: Self-pay

## 2018-06-15 DIAGNOSIS — Z79899 Other long term (current) drug therapy: Secondary | ICD-10-CM | POA: Insufficient documentation

## 2018-06-15 NOTE — Telephone Encounter (Signed)
Patient is aware that XR did not show any acute abnormality. No evidence of active or or previous tuberculosis. Patient will come in on Monday 11/25 for PPD placement. Maryjean Morn, CMA

## 2018-06-15 NOTE — Telephone Encounter (Signed)
-----   Message from Loletta Specter, PA-C sent at 06/15/2018  1:30 PM EST ----- No acute abnormality. No evidence of active or previous tuberculosis infection. Don't forget to come in on Monday to have PPD placed.

## 2018-06-16 LAB — COMPREHENSIVE METABOLIC PANEL
A/G RATIO: 1 — AB (ref 1.2–2.2)
ALK PHOS: 101 IU/L (ref 39–117)
ALT: 24 IU/L (ref 0–44)
AST: 28 IU/L (ref 0–40)
Albumin: 4.4 g/dL (ref 3.5–5.5)
BILIRUBIN TOTAL: 0.3 mg/dL (ref 0.0–1.2)
BUN/Creatinine Ratio: 12 (ref 9–20)
BUN: 13 mg/dL (ref 6–24)
CO2: 21 mmol/L (ref 20–29)
Calcium: 10.1 mg/dL (ref 8.7–10.2)
Chloride: 100 mmol/L (ref 96–106)
Creatinine, Ser: 1.1 mg/dL (ref 0.76–1.27)
GFR calc Af Amer: 88 mL/min/{1.73_m2} (ref >59)
GFR calc non Af Amer: 76 mL/min/{1.73_m2} (ref >59)
Globulin, Total: 4.5 g/dL (ref 1.5–4.5)
Glucose: 85 mg/dL (ref 65–99)
POTASSIUM: 4.6 mmol/L (ref 3.5–5.2)
Sodium: 138 mmol/L (ref 134–144)
Total Protein: 8.9 g/dL — ABNORMAL HIGH (ref 6.0–8.5)

## 2018-06-16 LAB — CBC WITH DIFFERENTIAL/PLATELET
Basophils Absolute: 0.1 10*3/uL (ref 0.0–0.2)
Basos: 1 %
EOS (ABSOLUTE): 0 10*3/uL (ref 0.0–0.4)
Eos: 0 %
HEMOGLOBIN: 12.3 g/dL — AB (ref 13.0–17.7)
Hematocrit: 39.4 % (ref 37.5–51.0)
IMMATURE GRANS (ABS): 0 10*3/uL (ref 0.0–0.1)
Immature Granulocytes: 0 %
LYMPHS ABS: 2.1 10*3/uL (ref 0.7–3.1)
Lymphs: 26 %
MCH: 25.2 pg — AB (ref 26.6–33.0)
MCHC: 31.2 g/dL — ABNORMAL LOW (ref 31.5–35.7)
MCV: 81 fL (ref 79–97)
Monocytes Absolute: 0.6 10*3/uL (ref 0.1–0.9)
Monocytes: 8 %
NEUTROS PCT: 65 %
Neutrophils Absolute: 5.3 10*3/uL (ref 1.4–7.0)
Platelets: 359 10*3/uL (ref 150–450)
RBC: 4.88 x10E6/uL (ref 4.14–5.80)
RDW: 13.4 % (ref 12.3–15.4)
WBC: 8.2 10*3/uL (ref 3.4–10.8)

## 2018-06-16 LAB — PSA: PROSTATE SPECIFIC AG, SERUM: 1.5 ng/mL (ref 0.0–4.0)

## 2018-06-16 LAB — C-REACTIVE PROTEIN: CRP: 10 mg/L (ref 0–10)

## 2018-06-16 LAB — RHEUMATOID FACTOR: RHEUMATOID FACTOR: 168.7 [IU]/mL — AB (ref 0.0–13.9)

## 2018-06-16 LAB — SEDIMENTATION RATE: SED RATE: 38 mm/h — AB (ref 0–30)

## 2018-06-16 LAB — CYCLIC CITRUL PEPTIDE ANTIBODY, IGG/IGA

## 2018-06-16 LAB — ANA W/REFLEX: ANA: NEGATIVE

## 2018-06-18 ENCOUNTER — Other Ambulatory Visit (INDEPENDENT_AMBULATORY_CARE_PROVIDER_SITE_OTHER): Payer: Self-pay | Admitting: Physician Assistant

## 2018-06-18 ENCOUNTER — Ambulatory Visit (INDEPENDENT_AMBULATORY_CARE_PROVIDER_SITE_OTHER): Payer: Self-pay | Admitting: Physician Assistant

## 2018-06-18 DIAGNOSIS — D649 Anemia, unspecified: Secondary | ICD-10-CM

## 2018-06-18 DIAGNOSIS — Z111 Encounter for screening for respiratory tuberculosis: Secondary | ICD-10-CM

## 2018-06-18 DIAGNOSIS — Z79899 Other long term (current) drug therapy: Secondary | ICD-10-CM

## 2018-06-18 NOTE — Progress Notes (Signed)
Patient did take medication today. Patient has not eaten today.

## 2018-06-18 NOTE — Progress Notes (Signed)
Subjective:  Patient ID: Dennis Harris, male    DOB: [birth year]  Age: 52 y.o. MRN: [id number]  CC: PPD placement and HTN  HPI Percy L Arringtonis a 51 y.o.malewith a PMH of Avascular Necrosis, HTN, hypercholesterolemia, RA, and pericarditis presents for f/u of HTN and /for PPD placement. Last BP 178/95 mmHg four days ago. Had not been taking his HCTZ but had been taking Amlodipine and Losartan. BP 134/79 mmHg today after taking all anti-hypertensives as directed.     He is also here for PPD placement. Undergoing preliminary testing before possible prescription for Methotrexate. Had previously used Methotrexate around 2015 with rheumatologist. Has recently had blood work confirming a positive Rheumatoid factor and anti-CCP. 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   Outpatient Medications Prior to Visit  Medication Sig Dispense Refill  . acetaminophen-codeine (TYLENOL #3) 300-30 MG tablet Take 2 tablets by mouth every 8 (eight) hours as needed for up to 7 days for moderate pain. 42 tablet 0  . amLODipine (NORVASC) 10 MG tablet Take 1 tablet (10 mg total) by mouth daily. 90 tablet 1  . aspirin 81 MG chewable tablet Chew 1 tablet (81 mg total) by mouth 2 (two) times daily. 90 tablet 3  . hydrochlorothiazide (HYDRODIURIL) 12.5 MG tablet Take 2 tablets (25 mg total) by mouth daily. Take on tablet in the morning. 90 tablet 1  . HYDROCORTISONE EX Apply 1 application topically 2 (two) times daily as needed (for razor bumps/rash/itching.).    Marland Kitchen losartan (COZAAR) 100 MG tablet Take 1 tablet (100 mg total) by mouth daily. 90 tablet 1  . Omega-3 Fatty Acids (FISH OIL) 1000 MG CAPS Take 1,000 mg by mouth daily.    . potassium chloride SA (K-DUR,KLOR-CON) 20 MEQ tablet Take one tablet twice a day for five days, then take one tablet a day. (Patient not taking: Reported on 06/14/2018) 35 tablet 0  . sildenafil (REVATIO) 20 MG tablet Take 1 tablet (20 mg total) by  mouth daily. 10 tablet 0   No facility-administered medications prior to visit.      ROS Review of Systems  Constitutional: Negative for chills, fever and malaise/fatigue.  Eyes: Negative for blurred vision.  Respiratory: Negative for shortness of breath.   Cardiovascular: Negative for chest pain and palpitations.  Gastrointestinal: Negative for abdominal pain and nausea.  Genitourinary: Negative for dysuria and hematuria.  Musculoskeletal: Positive for joint pain. Negative for myalgias.  Skin: Negative for rash.  Neurological: Negative for tingling and headaches.  Psychiatric/Behavioral: Negative for depression. The patient is not nervous/anxious.     Objective:  There were no vitals taken for this visit.  BP/Weight 06/14/2018 04/15/2018 07/01/2017  Systolic BP 178 127 124  Diastolic BP 95 76 72  Wt. (Lbs) 152.4 160 -  BMI 21.87 22.96 -      Physical Exam  Constitutional: He is oriented to person, place, and time.  Well developed, well nourished, NAD, polite  HENT:  Head: Normocephalic and atraumatic.  Eyes: No scleral icterus.  Neck: Normal range of motion. Neck supple. No thyromegaly present.  Pulmonary/Chest: Effort normal.  Musculoskeletal: He exhibits no edema or deformity.  Neurological: He is alert and oriented to person, place, and time.  Skin: Skin is warm and dry. No rash noted. No erythema. No pallor.  Psychiatric: He has a normal mood and affect. His behavior is normal. Thought content normal.  Vitals reviewed.    Assessment & Plan:   1. High risk medication  use - PPD  2. Anemia, unspecified type - Hemoglobinopathy evaluation - Sickle cell screen - Iron, TIBC and Ferritin Panel - B12 and Folate Panel     Follow-up: Will call with results.   [facility]

## 2018-06-20 ENCOUNTER — Telehealth (INDEPENDENT_AMBULATORY_CARE_PROVIDER_SITE_OTHER): Payer: Self-pay

## 2018-06-20 ENCOUNTER — Other Ambulatory Visit (INDEPENDENT_AMBULATORY_CARE_PROVIDER_SITE_OTHER): Payer: Self-pay | Admitting: Physician Assistant

## 2018-06-20 ENCOUNTER — Ambulatory Visit (INDEPENDENT_AMBULATORY_CARE_PROVIDER_SITE_OTHER): Payer: Self-pay

## 2018-06-20 DIAGNOSIS — Z111 Encounter for screening for respiratory tuberculosis: Secondary | ICD-10-CM

## 2018-06-20 LAB — TB SKIN TEST
Induration: 0 mm
TB SKIN TEST: NEGATIVE

## 2018-06-20 LAB — B12 AND FOLATE PANEL
FOLATE: 11.7 ng/mL (ref >3.0)
VITAMIN B 12: 496 pg/mL (ref 232–1245)

## 2018-06-20 LAB — IRON,TIBC AND FERRITIN PANEL
FERRITIN: 201 ng/mL (ref 30–400)
IRON: 98 ug/dL (ref 38–169)
Iron Saturation: 30 % (ref 15–55)
Total Iron Binding Capacity: 332 ug/dL (ref 250–450)
UIBC: 234 ug/dL (ref 111–343)

## 2018-06-20 LAB — HEMOGLOBINOPATHY EVALUATION
HGB A: 97.9 % (ref 96.4–98.8)
HGB C: 0 %
HGB S: 0 %
HGB VARIANT: 0 %
Hemoglobin A2 Quantitation: 2.1 % (ref 1.8–3.2)
Hemoglobin F Quantitation: 0 % (ref 0.0–2.0)

## 2018-06-20 LAB — SICKLE CELL SCREEN: Sickle Cell Screen: NEGATIVE

## 2018-06-20 MED ORDER — METHOTREXATE 2.5 MG PO TABS: 5.0000 mg | ORAL_TABLET | ORAL | 2 refills | Status: DC

## 2018-06-20 MED ORDER — FOLIC ACID 1 MG PO TABS: 1.0000 mg | ORAL_TABLET | Freq: Every day | ORAL | 2 refills | Status: DC

## 2018-06-20 MED FILL — FOLIC ACID 1 MG TABS: 1 | 30 days supply | Qty: 30 | Fill #0

## 2018-06-20 MED FILL — METHOTREXATE SODIUM 2.5 MG: 2.5 | 28 days supply | Qty: 8 | Fill #0

## 2018-06-20 NOTE — Telephone Encounter (Signed)
Patient is aware that methotrexate and folate have been sent to [facility], return to clinic in one month for lab testing of liver and cells. Maryjean Morn, CMA

## 2018-06-20 NOTE — Telephone Encounter (Signed)
-----   Message from Loletta Specter, PA-C sent at 06/20/2018 11:16 AM EST ----- I have sent methotrexate and folate to [facility]. Pt is to return in one month for lab testing of liver and cells.

## 2018-06-20 NOTE — Telephone Encounter (Signed)
-----   Message from Loletta Specter, PA-C sent at 06/20/2018  4:52 PM EST ----- All results completely normal.

## 2018-06-20 NOTE — Telephone Encounter (Signed)
Patient is aware that labs normal. Dennis Harris S Elmo Rio, CMA  

## 2018-06-25 ENCOUNTER — Ambulatory Visit: Payer: Self-pay

## 2018-06-27 ENCOUNTER — Ambulatory Visit (INDEPENDENT_AMBULATORY_CARE_PROVIDER_SITE_OTHER): Payer: Self-pay | Admitting: Orthopaedic Surgery

## 2018-07-11 ENCOUNTER — Ambulatory Visit: Payer: Self-pay

## 2018-07-27 ENCOUNTER — Other Ambulatory Visit: Payer: Self-pay

## 2018-07-27 ENCOUNTER — Ambulatory Visit (INDEPENDENT_AMBULATORY_CARE_PROVIDER_SITE_OTHER): Payer: Self-pay | Admitting: Physician Assistant

## 2018-07-27 ENCOUNTER — Encounter (INDEPENDENT_AMBULATORY_CARE_PROVIDER_SITE_OTHER): Payer: Self-pay | Admitting: Physician Assistant

## 2018-07-27 VITALS — BP 158/90 | HR 88 | Temp 97.8°F | Ht 70.0 in | Wt 159.0 lb

## 2018-07-27 DIAGNOSIS — Z79899 Other long term (current) drug therapy: Secondary | ICD-10-CM

## 2018-07-27 DIAGNOSIS — M069 Rheumatoid arthritis, unspecified: Secondary | ICD-10-CM

## 2018-07-27 MED ORDER — PREDNISONE 20 MG PO TABS: 40.0000 mg | ORAL_TABLET | Freq: Every day | ORAL | 0 refills | Status: DC

## 2018-07-27 MED ORDER — METHOTREXATE 2.5 MG PO TABS: 10.0000 mg | ORAL_TABLET | ORAL | 5 refills | Status: DC

## 2018-07-27 MED FILL — predniSONE 20 MG TABS: 20 | 7 days supply | Qty: 14 | Fill #0

## 2018-07-27 MED FILL — METHOTREXATE SODIUM 2.5 MG: 2.5 | 49 days supply | Qty: 28 | Fill #0

## 2018-07-27 NOTE — Progress Notes (Signed)
Subjective:  Patient ID: Dennis Harris, male    DOB: [birth year]  Age: 53 y.o. MRN: [id number]  CC: f/u high risk medication use   HPI Dennis L Arringtonis a 53 y.o.malewith a PMH of Avascular Necrosis, HTN, hypercholesterolemia, RA, and pericarditis presents for f/u of high risk medication use. Currently taking methotrexate for his RA. Says RA symptoms are minimally improved since taking methotrexate 5mg . Says pain in the feet has improved somewhat but the pain in the knees, shoulders, and hands persists. Denies any side effects with Methotrexate use. Denies any other symptoms or complaints.     Outpatient Medications Prior to Visit  Medication Sig Dispense Refill  . amLODipine (NORVASC) 10 MG tablet Take 1 tablet (10 mg total) by mouth daily. 90 tablet 1  . aspirin 81 MG chewable tablet Chew 1 tablet (81 mg total) by mouth 2 (two) times daily. 90 tablet 3  . folic acid (FOLVITE) 1 MG tablet Take 1 tablet (1 mg total) by mouth daily. 30 tablet 2  . hydrochlorothiazide (HYDRODIURIL) 12.5 MG tablet Take 2 tablets (25 mg total) by mouth daily. Take on tablet in the morning. 90 tablet 1  . HYDROCORTISONE EX Apply 1 application topically 2 (two) times daily as needed (for razor bumps/rash/itching.).    Marland Kitchen losartan (COZAAR) 100 MG tablet Take 1 tablet (100 mg total) by mouth daily. 90 tablet 1  . methotrexate (RHEUMATREX) 2.5 MG tablet Take 2 tablets (5 mg total) by mouth once a week. Caution:Chemotherapy. Protect from light. 8 tablet 2  . Omega-3 Fatty Acids (FISH OIL) 1000 MG CAPS Take 1,000 mg by mouth daily.    . sildenafil (REVATIO) 20 MG tablet Take 1 tablet (20 mg total) by mouth daily. 10 tablet 0  . potassium chloride SA (K-DUR,KLOR-CON) 20 MEQ tablet Take one tablet twice a day for five days, then take one tablet a day. (Patient not taking: Reported on 06/14/2018) 35 tablet 0   No facility-administered medications prior to visit.      ROS Review of Systems   Constitutional: Negative for chills, fever and malaise/fatigue.  Eyes: Negative for blurred vision.  Respiratory: Negative for shortness of breath.   Cardiovascular: Negative for chest pain and palpitations.  Gastrointestinal: Negative for abdominal pain and nausea.  Genitourinary: Negative for dysuria and hematuria.  Musculoskeletal: Positive for joint pain. Negative for myalgias.  Skin: Negative for rash.  Neurological: Negative for tingling and headaches.  Psychiatric/Behavioral: Negative for depression. The patient is not nervous/anxious.     Objective:  BP (!) 158/90 (BP Location: Right Arm, Patient Position: Sitting, Cuff Size: Normal)   Pulse 88   Temp 97.8 F (36.6 C) (Oral)   Ht 5\' 10"  (1.778 m)   Wt 159 lb (72.1 kg)   SpO2 100%   BMI 22.81 kg/m   BP/Weight 07/27/2018 06/18/2018 06/14/2018  Systolic BP 158 134 178  Diastolic BP 90 79 95  Wt. (Lbs) 159 - 152.4  BMI 22.81 - 21.87      Physical Exam Vitals signs reviewed.  Constitutional:      Comments: Well developed, well nourished, NAD, polite  HENT:     Head: Normocephalic and atraumatic.  Eyes:     General: No scleral icterus. Neck:     Musculoskeletal: Normal range of motion and neck supple.     Thyroid: No thyromegaly.     Comments: No LE edema bilaterally Cardiovascular:     Rate and Rhythm: Normal rate and regular rhythm.  Heart sounds: Normal heart sounds.  Pulmonary:     Effort: Pulmonary effort is normal.     Breath sounds: Normal breath sounds.  Musculoskeletal: Normal range of motion.        General: No deformity.  Skin:    General: Skin is warm and dry.     Coloration: Skin is not pale.     Findings: No erythema or rash.  Neurological:     Mental Status: He is alert and oriented to person, place, and time.  Psychiatric:        Behavior: Behavior normal.        Thought Content: Thought content normal.      Assessment & Plan:    1. Rheumatoid arthritis involving multiple sites,  unspecified rheumatoid factor presence (HCC) - Begin predniSONE (DELTASONE) 20 MG tablet; Take 2 tablets (40 mg total) by mouth daily with breakfast.  Dispense: 14 tablet; Refill: 0 - Increase methotrexate (RHEUMATREX) 2.5 MG tablet; Take 4 tablets (10 mg total) by mouth once a week. Caution:Chemotherapy. Protect from light.  Dispense: 28 tablet; Refill: 5 - Ambulatory referral to Rheumatology.  2. High risk medication use - CBC with Differential - Comprehensive metabolic panel   Meds ordered this encounter  Medications  . predniSONE (DELTASONE) 20 MG tablet    Sig: Take 2 tablets (40 mg total) by mouth daily with breakfast.    Dispense:  14 tablet    Refill:  0    Order Specific Question:   Supervising Provider    Answer:   Hoy Register [[id number]]  . methotrexate (RHEUMATREX) 2.5 MG tablet    Sig: Take 4 tablets (10 mg total) by mouth once a week. Caution:Chemotherapy. Protect from light.    Dispense:  28 tablet    Refill:  5    Order Specific Question:   Supervising Provider    Answer:   Hoy Register [[id number]]    Follow-up: Return in about 4 weeks (around 08/24/2018) for RA and methotrexate dosing. Check methotrexate level.   Loletta Specter PA

## 2018-07-27 NOTE — Patient Instructions (Signed)
Rheumatoid Arthritis  Rheumatoid arthritis (RA) is a long-term (chronic) disease. RA causes inflammation in your joints. Your joints may feel painful, stiff, swollen, and warm. RA may start slowly. It most often affects the small joints of the hands and feet. It can also affect other parts of the body. Symptoms of RA often come and go.  There is no cure for RA, but medicines can help your symptoms.  What are the causes?  · RA is an autoimmune disease. This means that your body's defense system (immune system) attacks healthy parts of your body by mistake. The exact cause of RA is not known.  What increases the risk?  · Being a woman.  · Having a family history of RA or other diseases like RA.  · Smoking.  · Being overweight.  · Being exposed to pollutants or chemicals.  What are the signs or symptoms?  · Morning stiffness that lasts longer than 30 minutes. This is often the first symptom.  · Symptoms start slowly. They are often worse in the morning.  · As RA gets worse, symptoms may include:  ? Pain, stiffness, swelling, warmth, and tenderness in joints on both sides of your body.  ? Loss of energy.  ? Not feeling hungry.  ? Weight loss.  ? A low fever.  ? Dry eyes and a dry mouth.  ? Firm lumps that grow under your skin.  ? Changes in the way your joints look.  ? Changes in the way your joints work.  · Symptoms vary and they:  ? Often come and go.  ? Sometimes get worse for a period of time. These are called flares.  How is this treated?    · Treatment may include:  ? Taking good care of yourself. Be sure to rest as needed, eat a healthy diet, and exercise.  ? Medicines. These may include:  § Pain relievers.  § Medicines to help with inflammation.  § Disease-modifying antirheumatic drugs (DMARDs).  § Medicines called biologic response modifiers.  ? Physical therapy and occupational therapy.  ? Surgery, if joint damage is very bad.  Your doctor will work with you to find the best treatments.  Follow these  instructions at home:  Activity  · Return to your normal activities as told by your doctor. Ask your doctor what activities are safe for you.  · Rest when you have a flare.  · Exercise as told by your doctor.  General instructions  · Take over-the-counter and prescription medicines only as told by your doctor.  · Keep all follow-up visits as told by your doctor. This is important.  Where to find more information  · American College of Rheumatology: www.rheumatology.org  · Arthritis Foundation: www.arthritis.org  Contact a doctor if:  · You have a flare.  · You have a fever.  · You have problems because of your medicines.  Get help right away if:  · You have chest pain.  · You have trouble breathing.  · You get a hot, painful joint all of a sudden, and it is worse than your normal joint aches.  Summary  · RA is a long-term disease.  · Symptoms of RA start slowly. They are often worse in the morning.  · RA causes inflammation in your joints.  This information is not intended to replace advice given to you by your health care provider. Make sure you discuss any questions you have with your health care provider.  Document Released: 10/03/2011 Document   Revised: 03/14/2018 Document Reviewed: 03/14/2018  Elsevier Interactive Patient Education © 2019 [employer]

## 2018-07-28 LAB — COMPREHENSIVE METABOLIC PANEL
ALBUMIN: 4.2 g/dL (ref 3.5–5.5)
ALK PHOS: 117 IU/L (ref 39–117)
ALT: 18 IU/L (ref 0–44)
AST: 20 IU/L (ref 0–40)
Albumin/Globulin Ratio: 1 — ABNORMAL LOW (ref 1.2–2.2)
BILIRUBIN TOTAL: 0.3 mg/dL (ref 0.0–1.2)
BUN/Creatinine Ratio: 10 (ref 9–20)
BUN: 10 mg/dL (ref 6–24)
CALCIUM: 10 mg/dL (ref 8.7–10.2)
CHLORIDE: 102 mmol/L (ref 96–106)
CO2: 21 mmol/L (ref 20–29)
Creatinine, Ser: 1.01 mg/dL (ref 0.76–1.27)
GFR, EST AFRICAN AMERICAN: 98 mL/min/{1.73_m2} (ref >59)
GFR, EST NON AFRICAN AMERICAN: 85 mL/min/{1.73_m2} (ref >59)
Globulin, Total: 4.2 g/dL (ref 1.5–4.5)
Glucose: 90 mg/dL (ref 65–99)
Potassium: 4.7 mmol/L (ref 3.5–5.2)
SODIUM: 138 mmol/L (ref 134–144)
TOTAL PROTEIN: 8.4 g/dL (ref 6.0–8.5)

## 2018-07-28 LAB — CBC WITH DIFFERENTIAL/PLATELET
BASOS ABS: 0.1 10*3/uL (ref 0.0–0.2)
Basos: 1 %
EOS (ABSOLUTE): 0.1 10*3/uL (ref 0.0–0.4)
Eos: 2 %
Hematocrit: 35.7 % — ABNORMAL LOW (ref 37.5–51.0)
Hemoglobin: 11.2 g/dL — ABNORMAL LOW (ref 13.0–17.7)
IMMATURE GRANS (ABS): 0 10*3/uL (ref 0.0–0.1)
Immature Granulocytes: 0 %
LYMPHS ABS: 1.2 10*3/uL (ref 0.7–3.1)
Lymphs: 18 %
MCH: 24.2 pg — ABNORMAL LOW (ref 26.6–33.0)
MCHC: 31.4 g/dL — ABNORMAL LOW (ref 31.5–35.7)
MCV: 77 fL — ABNORMAL LOW (ref 79–97)
MONOCYTES: 12 %
Monocytes Absolute: 0.8 10*3/uL (ref 0.1–0.9)
NEUTROS ABS: 4.5 10*3/uL (ref 1.4–7.0)
NEUTROS PCT: 67 %
PLATELETS: 345 10*3/uL (ref 150–450)
RBC: 4.62 x10E6/uL (ref 4.14–5.80)
RDW: 13.8 % (ref 12.3–15.4)
WBC: 6.8 10*3/uL (ref 3.4–10.8)

## 2018-08-02 ENCOUNTER — Telehealth (INDEPENDENT_AMBULATORY_CARE_PROVIDER_SITE_OTHER): Payer: Self-pay

## 2018-08-02 NOTE — Telephone Encounter (Signed)
-----   Message from Loletta Specter, PA-C sent at 08/01/2018  9:20 AM EST ----- Anemic. I recommend he return for work up of his microcytic anemia. Most common cause is usually iron deficiency. Take OTC Gentle iron pills for now.

## 2018-08-02 NOTE — Telephone Encounter (Signed)
Left voicemail for patient notifying him that he is anemic. Take OTC gentle iron pills for now. Most common cause for anemia is usually iron deficiency. Return for further work up of anemia. Return call to office with any questions or concerns. Maryjean Morn, CMA

## 2018-08-30 ENCOUNTER — Ambulatory Visit (INDEPENDENT_AMBULATORY_CARE_PROVIDER_SITE_OTHER): Payer: Self-pay | Admitting: Critical Care Medicine

## 2018-10-08 ENCOUNTER — Emergency Department (HOSPITAL_COMMUNITY)
Admission: EM | Admit: 2018-10-08 | Discharge: 2018-10-08 | Disposition: A | Discharge status: Home / Self Care | Payer: Self-pay | Attending: Emergency Medicine | Admitting: Emergency Medicine

## 2018-10-08 ENCOUNTER — Other Ambulatory Visit: Payer: Self-pay

## 2018-10-08 DIAGNOSIS — K0889 Other specified disorders of teeth and supporting structures: Secondary | ICD-10-CM | POA: Insufficient documentation

## 2018-10-08 DIAGNOSIS — Z5321 Procedure and treatment not carried out due to patient leaving prior to being seen by health care provider: Secondary | ICD-10-CM | POA: Insufficient documentation

## 2018-10-08 MED FILL — HYDROCHLOROTHIAZIDE 12.5 MG: 12.5 | 30 days supply | Qty: 60 | Fill #1

## 2018-10-08 NOTE — ED Notes (Signed)
No answer for room, X3.

## 2018-10-08 NOTE — ED Triage Notes (Signed)
Pt reports R upper dental pain since Sunday. Poor dental hygiene.

## 2018-11-06 MED FILL — METHOTREXATE SODIUM 2.5 MG: 2.5 | 49 days supply | Qty: 28 | Fill #1

## 2019-02-12 DIAGNOSIS — M0579 Rheumatoid arthritis with rheumatoid factor of multiple sites without organ or systems involvement: Secondary | ICD-10-CM | POA: Diagnosis not present

## 2019-02-12 MED FILL — predniSONE 5 MG TABS: 5 | 12 days supply | Qty: 48 | Fill #0

## 2019-02-12 MED FILL — METHOTREXATE 2.5 MG TABLET: 2.5 | 28 days supply | Qty: 24 | Fill #0

## 2019-03-25 MED FILL — METHOTREXATE SODIUM 2.5 MG: 2.5 | 28 days supply | Qty: 24 | Fill #1

## 2019-04-30 ENCOUNTER — Other Ambulatory Visit: Payer: Self-pay

## 2019-04-30 DIAGNOSIS — Z20822 Contact with and (suspected) exposure to covid-19: Secondary | ICD-10-CM

## 2019-05-02 ENCOUNTER — Telehealth: Payer: Self-pay | Admitting: General Practice

## 2019-05-02 LAB — NOVEL CORONAVIRUS, NAA: SARS-CoV-2, NAA: NOT DETECTED

## 2019-05-02 NOTE — Telephone Encounter (Signed)
Negative COVID results given. Patient results "NOT Detected." Caller expressed understanding. ° °

## 2019-05-21 MED FILL — METHOTREXATE SODIUM 2.5 MG: 2.5 | 28 days supply | Qty: 24 | Fill #1

## 2019-08-08 DIAGNOSIS — M0579 Rheumatoid arthritis with rheumatoid factor of multiple sites without organ or systems involvement: Secondary | ICD-10-CM | POA: Diagnosis not present

## 2019-08-08 MED FILL — predniSONE 5 MG TABS: 5 | 12 days supply | Qty: 48 | Fill #0

## 2019-08-09 MED FILL — FOLIC ACID 1 MG TABS: 1 | 30 days supply | Qty: 30 | Fill #0

## 2019-08-09 MED FILL — METHOTREXATE SODIUM 2.5 MG: 2.5 | 28 days supply | Qty: 24 | Fill #0

## 2019-09-02 MED FILL — METHOTREXATE SODIUM 2.5 MG: 2.5 | 28 days supply | Qty: 24 | Fill #1

## 2019-10-01 MED FILL — METHOTREXATE SODIUM 2.5 MG: 2.5 | 28 days supply | Qty: 24 | Fill #2

## 2019-11-01 ENCOUNTER — Ambulatory Visit (INDEPENDENT_AMBULATORY_CARE_PROVIDER_SITE_OTHER): Payer: Self-pay | Admitting: Primary Care

## 2019-11-07 ENCOUNTER — Other Ambulatory Visit: Payer: Self-pay

## 2019-11-07 ENCOUNTER — Encounter (INDEPENDENT_AMBULATORY_CARE_PROVIDER_SITE_OTHER): Payer: Self-pay | Admitting: Primary Care

## 2019-11-07 ENCOUNTER — Ambulatory Visit (INDEPENDENT_AMBULATORY_CARE_PROVIDER_SITE_OTHER): Payer: BC Managed Care – PPO | Admitting: Primary Care

## 2019-11-07 VITALS — BP 194/104 | HR 77 | Temp 97.2°F | Resp 16 | Ht 70.5 in | Wt 151.8 lb

## 2019-11-07 DIAGNOSIS — Z125 Encounter for screening for malignant neoplasm of prostate: Secondary | ICD-10-CM

## 2019-11-07 DIAGNOSIS — M79671 Pain in right foot: Secondary | ICD-10-CM

## 2019-11-07 DIAGNOSIS — G8929 Other chronic pain: Secondary | ICD-10-CM

## 2019-11-07 DIAGNOSIS — E782 Mixed hyperlipidemia: Secondary | ICD-10-CM | POA: Diagnosis not present

## 2019-11-07 DIAGNOSIS — M79672 Pain in left foot: Secondary | ICD-10-CM

## 2019-11-07 DIAGNOSIS — D649 Anemia, unspecified: Secondary | ICD-10-CM

## 2019-11-07 DIAGNOSIS — Z1211 Encounter for screening for malignant neoplasm of colon: Secondary | ICD-10-CM

## 2019-11-07 DIAGNOSIS — I10 Essential (primary) hypertension: Secondary | ICD-10-CM | POA: Diagnosis not present

## 2019-11-07 MED ORDER — CHLORTHALIDONE 50 MG PO TABS: 50.0000 mg | ORAL_TABLET | Freq: Every day | ORAL | 1 refills | Status: DC

## 2019-11-07 MED ORDER — LOSARTAN POTASSIUM 100 MG PO TABS: 100.0000 mg | ORAL_TABLET | Freq: Every day | ORAL | 1 refills | Status: DC

## 2019-11-07 MED ORDER — AMLODIPINE BESYLATE 10 MG PO TABS: 10.0000 mg | ORAL_TABLET | Freq: Every day | ORAL | 1 refills | Status: DC

## 2019-11-07 MED FILL — CHLORTHALIDONE 50 MG TABS: 50 | 30 days supply | Qty: 30 | Fill #0

## 2019-11-07 MED FILL — METHOTREXATE SODIUM 2.5 MG: 2.5 | 28 days supply | Qty: 24 | Fill #3

## 2019-11-07 MED FILL — AMLODIPINE BESYLATE 10 MG T: 10 | 30 days supply | Qty: 30 | Fill #0

## 2019-11-07 MED FILL — LOSARTAN POTASSIUM 100 MG T: 100 | 30 days supply | Qty: 30 | Fill #0

## 2019-11-07 NOTE — Progress Notes (Signed)
Subjective:  Patient ID: Dennis Harris, male    DOB: Mar 29, 1966  Age: 54 y.o. MRN: [id number]  CC: No chief complaint on file.   HPI [name] is a 54 year old [place] American male he is pleasant alert and dressed appropriately. Presents with an elevated blood pressure has not been seen since 07/2018 he has been out of medications for months. Denies shortness of breath, headaches, chest pain or lower extremity edema. He does voice concerns about feet pain after working all day. He is managed by rheumatology on [facility]   Outpatient Medications Prior to Visit  Medication Sig Dispense Refill  . aspirin 81 MG chewable tablet Chew 1 tablet (81 mg total) by mouth 2 (two) times daily. 90 tablet 3  . folic acid (FOLVITE) 1 MG tablet Take 1 tablet (1 mg total) by mouth daily. 30 tablet 2  . methotrexate (RHEUMATREX) 2.5 MG tablet Take 4 tablets (10 mg total) by mouth once a week. Caution:Chemotherapy. Protect from light. 28 tablet 5  . Omega-3 Fatty Acids (FISH OIL) 1000 MG CAPS Take 1,000 mg by mouth daily.    Marland Kitchen HYDROCORTISONE EX Apply 1 application topically 2 (two) times daily as needed (for razor bumps/rash/itching.).    Marland Kitchen potassium chloride SA (K-DUR,KLOR-CON) 20 MEQ tablet Take one tablet twice a day for five days, then take one tablet a day. (Patient not taking: Reported on 06/14/2018) 35 tablet 0  . predniSONE (DELTASONE) 20 MG tablet Take 2 tablets (40 mg total) by mouth daily with breakfast. (Patient not taking: Reported on 11/07/2019) 14 tablet 0  . sildenafil (REVATIO) 20 MG tablet Take 1 tablet (20 mg total) by mouth daily. (Patient not taking: Reported on 11/07/2019) 10 tablet 0  . amLODipine (NORVASC) 10 MG tablet Take 1 tablet (10 mg total) by mouth daily. (Patient not taking: Reported on 11/07/2019) 90 tablet 1  . hydrochlorothiazide (HYDRODIURIL) 12.5 MG tablet Take 2 tablets (25 mg total) by mouth daily. Take on tablet in the morning. (Patient not taking: Reported on  11/07/2019) 90 tablet 1  . losartan (COZAAR) 100 MG tablet Take 1 tablet (100 mg total) by mouth daily. 90 tablet 1   No facility-administered medications prior to visit.    ROS Review of Systems  Musculoskeletal:       Feet pain  All other systems reviewed and are negative.   Objective:  BP (!) 194/104 Comment: manual  Pulse 77   Temp (!) 97.2 F (36.2 C)   Resp 16   Ht 5' 10.5" (1.791 m)   Wt 151 lb 12.8 oz (68.9 kg)   SpO2 100%   BMI 21.47 kg/m   BP/Weight 11/07/2019 6/81/2751 7/0/0174  Systolic BP 944 967 591  Diastolic BP 638 93 90  Wt. (Lbs) 151.8 - 159  BMI 21.47 - 22.81    Physical Exam Vitals and nursing note reviewed.  Constitutional:      Appearance: Normal appearance.  HENT:     Head: Normocephalic.  Eyes:     Extraocular Movements: Extraocular movements intact.     Pupils: Pupils are equal, round, and reactive to light.  Cardiovascular:     Rate and Rhythm: Normal rate and regular rhythm.     Pulses: Normal pulses.     Heart sounds: Normal heart sounds.  Pulmonary:     Effort: Pulmonary effort is normal.     Breath sounds: Normal breath sounds.  Abdominal:     General: Abdomen is flat.  Palpations: Abdomen is soft.  Musculoskeletal:        General: Normal range of motion.     Cervical back: Normal range of motion.  Skin:    General: Skin is warm and dry.  Neurological:     Mental Status: He is alert and oriented to person, place, and time.  Psychiatric:        Mood and Affect: Mood normal.        Behavior: Behavior normal.        Thought Content: Thought content normal.        Judgment: Judgment normal.      Assessment & Plan:  Diagnoses and all orders for this visit:  Hypertension, unspecified type Counseled on blood pressure goal of less than 130/80, low-sodium, DASH diet, medication compliance, 150 minutes of moderate intensity exercise per week. Discussed medication compliance, adverse effects. -     CMP14+EGFR -      amLODipine (NORVASC) 10 MG tablet; Take 1 tablet (10 mg total) by mouth daily. -     losartan (COZAAR) 100 MG tablet; Take 1 tablet (100 mg total) by mouth daily. -     chlorthalidone (HYGROTON) 50 MG tablet; Take 1 tablet (50 mg total) by mouth daily.  Anemia, unspecified type Abnormal hemoglobin and hemocrit  -     CBC with Differential  Chronic pain of both feet Wears steal toe shoes lifting beng and walking at work 8 hours on concrete with rheumatoid arthritis probably increasing discomfort. Followed by rheumatoid arthritis.  Mixed hyperlipidemia Decrease your fatty foods, red meat, cheese, milk and increase fiber like whole grains and veggies.  -     Lipid Panel  Colon cancer screening -     Ambulatory referral to Gastroenterology  Prostate cancer screening -     PSA    Follow-up: 4 weeks Bp check  Kerin Perna NP

## 2019-11-07 NOTE — Patient Instructions (Signed)
   Managing Your Hypertension Hypertension is commonly called high blood pressure. This is when the force of your blood pressing against the walls of your arteries is too strong. Arteries are blood vessels that carry blood from your heart throughout your body. Hypertension forces the heart to work harder to pump blood, and may cause the arteries to become narrow or stiff. Having untreated or uncontrolled hypertension can cause heart attack, stroke, kidney disease, and other problems. What are blood pressure readings? A blood pressure reading consists of a higher number over a lower number. Ideally, your blood pressure should be below 120/80. The first ("top") number is called the systolic pressure. It is a measure of the pressure in your arteries as your heart beats. The second ("bottom") number is called the diastolic pressure. It is a measure of the pressure in your arteries as the heart relaxes. What does my blood pressure reading mean? Blood pressure is classified into four stages. Based on your blood pressure reading, your health care provider may use the following stages to determine what type of treatment you need, if any. Systolic pressure and diastolic pressure are measured in a unit called mm Hg. Normal  Systolic pressure: below 120.  Diastolic pressure: below 80. Elevated  Systolic pressure: 120-129.  Diastolic pressure: below 80. Hypertension stage 1  Systolic pressure: 130-139.  Diastolic pressure: 80-89. Hypertension stage 2  Systolic pressure: 140 or above.  Diastolic pressure: 90 or above. What health risks are associated with hypertension? Managing your hypertension is an important responsibility. Uncontrolled hypertension can lead to:  A heart attack.  A stroke.  A weakened blood vessel (aneurysm).  Heart failure.  Kidney damage.  Eye damage.  Metabolic syndrome.  Memory and concentration problems. What changes can I make to manage my  hypertension? Hypertension can be managed by making lifestyle changes and possibly by taking medicines. Your health care provider will help you make a plan to bring your blood pressure within a normal range. Eating and drinking   Eat a diet that is high in fiber and potassium, and low in salt (sodium), added sugar, and fat. An example eating plan is called the DASH (Dietary Approaches to Stop Hypertension) diet. To eat this way: ? Eat plenty of fresh fruits and vegetables. Try to fill half of your plate at each meal with fruits and vegetables. ? Eat whole grains, such as whole wheat pasta, brown rice, or whole grain bread. Fill about one quarter of your plate with whole grains. ? Eat low-fat diary products. ? Avoid fatty cuts of meat, processed or cured meats, and poultry with skin. Fill about one quarter of your plate with lean proteins such as fish, chicken without skin, beans, eggs, and tofu. ? Avoid premade and processed foods. These tend to be higher in sodium, added sugar, and fat.  Reduce your daily sodium intake. Most people with hypertension should eat less than 1,500 mg of sodium a day.  Limit alcohol intake to no more than 1 drink a day for nonpregnant women and 2 drinks a day for men. One drink equals 12 oz of beer, 5 oz of wine, or 1 oz of hard liquor. Lifestyle  Work with your health care provider to maintain a healthy body weight, or to lose weight. Ask what an ideal weight is for you.  Get at least 30 minutes of exercise that causes your heart to beat faster (aerobic exercise) most days of the week. Activities may include walking, swimming, or biking.    Include exercise to strengthen your muscles (resistance exercise), such as weight lifting, as part of your weekly exercise routine. Try to do these types of exercises for 30 minutes at least 3 days a week.  Do not use any products that contain nicotine or tobacco, such as cigarettes and e-cigarettes. If you need help quitting,  ask your health care provider.  Control any long-term (chronic) conditions you have, such as high cholesterol or diabetes. Monitoring  Monitor your blood pressure at home as told by your health care provider. Your personal target blood pressure may vary depending on your medical conditions, your age, and other factors.  Have your blood pressure checked regularly, as often as told by your health care provider. Working with your health care provider  Review all the medicines you take with your health care provider because there may be side effects or interactions.  Talk with your health care provider about your diet, exercise habits, and other lifestyle factors that may be contributing to hypertension.  Visit your health care provider regularly. Your health care provider can help you create and adjust your plan for managing hypertension. Will I need medicine to control my blood pressure? Your health care provider may prescribe medicine if lifestyle changes are not enough to get your blood pressure under control, and if:  Your systolic blood pressure is 130 or higher.  Your diastolic blood pressure is 80 or higher. Take medicines only as told by your health care provider. Follow the directions carefully. Blood pressure medicines must be taken as prescribed. The medicine does not work as well when you skip doses. Skipping doses also puts you at risk for problems. Contact a health care provider if:  You think you are having a reaction to medicines you have taken.  You have repeated (recurrent) headaches.  You feel dizzy.  You have swelling in your ankles.  You have trouble with your vision. Get help right away if:  You develop a severe headache or confusion.  You have unusual weakness or numbness, or you feel faint.  You have severe pain in your chest or abdomen.  You vomit repeatedly.  You have trouble breathing. Summary  Hypertension is when the force of blood pumping  through your arteries is too strong. If this condition is not controlled, it may put you at risk for serious complications.  Your personal target blood pressure may vary depending on your medical conditions, your age, and other factors. For most people, a normal blood pressure is less than 120/80.  Hypertension is managed by lifestyle changes, medicines, or both. Lifestyle changes include weight loss, eating a healthy, low-sodium diet, exercising more, and limiting alcohol. This information is not intended to replace advice given to you by your health care provider. Make sure you discuss any questions you have with your health care provider. Document Revised: 11/02/2018 Document Reviewed: 06/08/2016 Elsevier Patient Education  2020 [employer]  

## 2019-11-07 NOTE — Progress Notes (Signed)
Establish care- BP medication RF - amlodipine and HCTZ Has not had BP medications in several months.Denies dizziness or HA  Bilateral foot pain/ achey 3/10

## 2019-11-08 LAB — CMP14+EGFR
ALT: 12 IU/L (ref 0–44)
AST: 20 IU/L (ref 0–40)
Albumin/Globulin Ratio: 1 — ABNORMAL LOW (ref 1.2–2.2)
Albumin: 3.9 g/dL (ref 3.8–4.9)
Alkaline Phosphatase: 102 IU/L (ref 39–117)
BUN/Creatinine Ratio: 10 (ref 9–20)
BUN: 10 mg/dL (ref 6–24)
Bilirubin Total: 0.2 mg/dL (ref 0.0–1.2)
CO2: 22 mmol/L (ref 20–29)
Calcium: 9.3 mg/dL (ref 8.7–10.2)
Chloride: 102 mmol/L (ref 96–106)
Creatinine, Ser: 0.97 mg/dL (ref 0.76–1.27)
GFR calc Af Amer: 101 mL/min/{1.73_m2} (ref >59)
GFR calc non Af Amer: 88 mL/min/{1.73_m2} (ref >59)
Globulin, Total: 4.1 g/dL (ref 1.5–4.5)
Glucose: 84 mg/dL (ref 65–99)
Potassium: 4.6 mmol/L (ref 3.5–5.2)
Sodium: 136 mmol/L (ref 134–144)
Total Protein: 8 g/dL (ref 6.0–8.5)

## 2019-11-08 LAB — PSA: Prostate Specific Ag, Serum: 1.6 ng/mL (ref 0.0–4.0)

## 2019-11-08 LAB — CBC WITH DIFFERENTIAL/PLATELET
Basophils Absolute: 0.1 10*3/uL (ref 0.0–0.2)
Basos: 2 %
EOS (ABSOLUTE): 0.1 10*3/uL (ref 0.0–0.4)
Eos: 2 %
Hematocrit: 35.6 % — ABNORMAL LOW (ref 37.5–51.0)
Hemoglobin: 11.2 g/dL — ABNORMAL LOW (ref 13.0–17.7)
Immature Grans (Abs): 0 10*3/uL (ref 0.0–0.1)
Immature Granulocytes: 0 %
Lymphocytes Absolute: 1.6 10*3/uL (ref 0.7–3.1)
Lymphs: 24 %
MCH: 25.6 pg — ABNORMAL LOW (ref 26.6–33.0)
MCHC: 31.5 g/dL (ref 31.5–35.7)
MCV: 81 fL (ref 79–97)
Monocytes Absolute: 0.7 10*3/uL (ref 0.1–0.9)
Monocytes: 10 %
Neutrophils Absolute: 4.2 10*3/uL (ref 1.4–7.0)
Neutrophils: 62 %
Platelets: 399 10*3/uL (ref 150–450)
RBC: 4.38 x10E6/uL (ref 4.14–5.80)
RDW: 14.5 % (ref 11.6–15.4)
WBC: 6.7 10*3/uL (ref 3.4–10.8)

## 2019-11-08 LAB — LIPID PANEL
Chol/HDL Ratio: 4.6 ratio (ref 0.0–5.0)
Cholesterol, Total: 187 mg/dL (ref 100–199)
HDL: 41 mg/dL (ref >39)
LDL Chol Calc (NIH): 133 mg/dL — ABNORMAL HIGH (ref 0–99)
Triglycerides: 67 mg/dL (ref 0–149)
VLDL Cholesterol Cal: 13 mg/dL (ref 5–40)

## 2019-11-14 ENCOUNTER — Telehealth (INDEPENDENT_AMBULATORY_CARE_PROVIDER_SITE_OTHER): Payer: Self-pay

## 2019-11-14 NOTE — Telephone Encounter (Signed)
Patient verified date of birth. He is aware that He still has anemia little change for a year.PSA normal . LDL slightly elevate start back taking Omega 3 fatty acid 1000mg  daily Try todecrease your fatty foods, red meat, cheese, milk and increase fiber like whole grains and veggies. You can also add a fiber supplement like Metamucil or Benefiber. Patient verbalized understanding. , CMA

## 2019-11-14 NOTE — Telephone Encounter (Signed)
-----   Message from [name], NP sent at 11/13/2019  9:16 AM EDT ----- He still has anemia little change for a year.PSA normal . LDL slightly elevate start back taking Omega 3 fatty acid 1000mg  daily Try todecrease your fatty foods, red meat, cheese, milk and increase fiber like whole grains and veggies. You can also add a fiber supplement like Metamucil or Benefiber.

## 2019-11-18 ENCOUNTER — Other Ambulatory Visit: Payer: Self-pay

## 2019-11-18 ENCOUNTER — Emergency Department (HOSPITAL_COMMUNITY)
Admission: EM | Admit: 2019-11-18 | Discharge: 2019-11-19 | Discharge status: Against Medical Advice | Payer: BC Managed Care – PPO | Attending: Emergency Medicine | Admitting: Emergency Medicine

## 2019-11-18 ENCOUNTER — Emergency Department (HOSPITAL_COMMUNITY): Payer: BC Managed Care – PPO

## 2019-11-18 DIAGNOSIS — R0789 Other chest pain: Secondary | ICD-10-CM | POA: Insufficient documentation

## 2019-11-18 DIAGNOSIS — Z5321 Procedure and treatment not carried out due to patient leaving prior to being seen by health care provider: Secondary | ICD-10-CM | POA: Diagnosis not present

## 2019-11-18 DIAGNOSIS — R079 Chest pain, unspecified: Secondary | ICD-10-CM | POA: Diagnosis not present

## 2019-11-18 LAB — BASIC METABOLIC PANEL
Anion gap: 11 (ref 5–15)
BUN: 14 mg/dL (ref 6–20)
CO2: 25 mmol/L (ref 22–32)
Calcium: 9.3 mg/dL (ref 8.9–10.3)
Chloride: 96 mmol/L — ABNORMAL LOW (ref 98–111)
Creatinine, Ser: 1.37 mg/dL — ABNORMAL HIGH (ref 0.61–1.24)
GFR calc Af Amer: >60 mL/min (ref >60)
GFR calc non Af Amer: 58 mL/min — ABNORMAL LOW (ref >60)
Glucose, Bld: 105 mg/dL — ABNORMAL HIGH (ref 70–99)
Potassium: 3.7 mmol/L (ref 3.5–5.1)
Sodium: 132 mmol/L — ABNORMAL LOW (ref 135–145)

## 2019-11-18 LAB — TROPONIN I (HIGH SENSITIVITY): Troponin I (High Sensitivity): 12 ng/L (ref <18)

## 2019-11-18 LAB — CBC
HCT: 36.3 % — ABNORMAL LOW (ref 39.0–52.0)
Hemoglobin: 11.5 g/dL — ABNORMAL LOW (ref 13.0–17.0)
MCH: 25.6 pg — ABNORMAL LOW (ref 26.0–34.0)
MCHC: 31.7 g/dL (ref 30.0–36.0)
MCV: 80.8 fL (ref 80.0–100.0)
Platelets: 403 10*3/uL — ABNORMAL HIGH (ref 150–400)
RBC: 4.49 MIL/uL (ref 4.22–5.81)
RDW: 13.8 % (ref 11.5–15.5)
WBC: 5.6 10*3/uL (ref 4.0–10.5)
nRBC: 0 % (ref 0.0–0.2)

## 2019-11-18 MED ORDER — SODIUM CHLORIDE 0.9% FLUSH: 3.0000 mL | Freq: Once | INTRAVENOUS | Status: DC

## 2019-11-18 NOTE — ED Notes (Signed)
Pt left due to wait time  

## 2019-11-18 NOTE — ED Triage Notes (Signed)
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 Endorses shortness of breath and like he might pass out. 324 ASA and 2 nitro given by EMS with some relief.

## 2019-11-19 ENCOUNTER — Telehealth (INDEPENDENT_AMBULATORY_CARE_PROVIDER_SITE_OTHER): Payer: Self-pay

## 2019-11-19 NOTE — Telephone Encounter (Signed)
Patient called stating that he had chest pain and shortness of breath yesterday while at work and was transported via EMS to the hospital. Patient states he waited for about 8 to 10 hours and never got to the doctor. Patient schedule an appointment to see PCP tomorrow @ 1:30 pm but will like for PCP advise.   Please contact the patient [phone]

## 2019-11-19 NOTE — Telephone Encounter (Signed)
Patient advised that if chest pain persists or gets worse before scheduled appointment tomorrow he should return to ED or Urgent care. He verbalized understanding.

## 2019-11-20 ENCOUNTER — Other Ambulatory Visit: Payer: Self-pay

## 2019-11-20 ENCOUNTER — Encounter (INDEPENDENT_AMBULATORY_CARE_PROVIDER_SITE_OTHER): Payer: Self-pay | Admitting: Primary Care

## 2019-11-20 ENCOUNTER — Ambulatory Visit (INDEPENDENT_AMBULATORY_CARE_PROVIDER_SITE_OTHER): Payer: BC Managed Care – PPO | Admitting: Primary Care

## 2019-11-20 VITALS — BP 134/76 | HR 91 | Temp 97.3°F | Ht 70.5 in | Wt 151.0 lb

## 2019-11-20 DIAGNOSIS — D649 Anemia, unspecified: Secondary | ICD-10-CM | POA: Diagnosis not present

## 2019-11-20 DIAGNOSIS — R079 Chest pain, unspecified: Secondary | ICD-10-CM | POA: Diagnosis not present

## 2019-11-20 MED ORDER — FERROUS SULFATE 325 (65 FE) MG PO TABS: 325.0000 mg | ORAL_TABLET | Freq: Every day | ORAL | 3 refills | Status: DC

## 2019-11-20 MED ORDER — NITROGLYCERIN 0.4 MG SL SUBL: 0.4000 mg | SUBLINGUAL_TABLET | SUBLINGUAL | 3 refills | Status: DC | PRN

## 2019-11-20 MED FILL — NITROGLYCERIN 0.4 MG TAB SL: 0.4 | 25 days supply | Qty: 25 | Fill #0

## 2019-11-20 MED FILL — FERROUS SULFATE 325 MG TAB: 325 (65 FE) | 30 days supply | Qty: 30 | Fill #0

## 2019-11-20 NOTE — Progress Notes (Signed)
Acute Office Visit  Subjective:    Patient ID: Dennis Harris, male    DOB: [birth year], 54 y.o.   MRN: [id number]  Chief Complaint  Patient presents with  . Chest Pain    when taking deep breaths   . Shortness of Breath    HPI Patient is in today for acute visit complains of heart racing any activity causes this reaction. He is not sleeping well a night. Complains shortness of breath tired non radiating. Dizziness and weakness. Symptoms started with walking and lifting at work. He went to the Emergency room and left review encounter anemic and troponin I is normal 12.   Past Medical History:  Diagnosis Date  . 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 Exertional shortness of breath    "at times" (11/14/2012)  . Hypercholesteremia   . Hypertension   . Pericarditis ~ 2009  . Pneumonia   . Rheumatoid arthritis(714.0)     Past Surgical History:  Procedure Laterality Date  . ANTERIOR CERVICAL DECOMP/DISCECTOMY FUSION N/A 11/14/2012   Procedure: ANTERIOR CERVICAL DECOMPRESSION/DISCECTOMY FUSION 1 LEVEL C5-6;  Surgeon: Venita Lick, MD;  Location: [facility] OR;  Service: Orthopedics;  Laterality: N/A;  . APPENDECTOMY  1970's  . BACK SURGERY    . CARDIAC CATHETERIZATION  May 2010   normal  . JOINT REPLACEMENT    . TONSILLECTOMY  1970's?  . 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 TOTAL HIP ARTHROPLASTY Right 12/13/2016   Procedure: RIGHT TOTAL HIP ARTHROPLASTY ANTERIOR APPROACH;  Surgeon: Kathryne Hitch, MD;  Location: [facility] OR;  Service: Orthopedics;  Laterality: Right;    Family History  Problem Relation Age of Onset  . Cirrhosis Father   . Lupus Sister   . Lupus Sister   . Hypertension Mother   . Stroke Mother   . Diabetes Mother     Social History   Socioeconomic History  . Marital status: Married    Spouse name: Not on file  . Number of children: Not on file  . Years of education: Not on file  . Highest education level: Not on file   Occupational History  . Not on file  Tobacco Use  . Smoking status: Current Every Day Smoker    Packs/day: 0.50    Years: 37.00    Pack years: 18.50    Types: Cigarettes  . Smokeless tobacco: Never Used  Substance and Sexual Activity  . Alcohol use: Yes    Alcohol/week: 14.0 standard drinks    Types: 14 Cans of beer per week    Comment: 2-3 12 ounce cans of beer per day  . Drug use: Yes    Types: Marijuana    Comment: 12/13/2016 "nothing in years"  . Sexual activity: Yes  Other Topics Concern  . Not on file  Social History Narrative  . Not on file   Social Determinants of Health   Financial Resource Strain:   . Difficulty of Paying Living Expenses:   Food Insecurity:   . Worried About Programme researcher, broadcasting/film/video in the Last Year:   . Barista in the Last Year:   Transportation Needs:   . Freight forwarder (Medical):   Marland Kitchen Lack of Transportation (Non-Medical):   Physical Activity:   . Days of Exercise per Week:   . Minutes of Exercise per Session:   Stress:   . Feeling of Stress :   Social Connections:   . Frequency of Communication  with Friends and Family:   . Frequency of Social Gatherings with Friends and Family:   . Attends Religious Services:   . Active Member of Clubs or Organizations:   . Attends Banker Meetings:   Marland Kitchen Marital Status:   Intimate Partner Violence:   . Fear of Current or Ex-Partner:   . Emotionally Abused:   Marland Kitchen Physically Abused:   . Sexually Abused:     Outpatient Medications Prior to Visit  Medication Sig Dispense Refill  . amLODipine (NORVASC) 10 MG tablet Take 1 tablet (10 mg total) by mouth daily. 90 tablet 1  . aspirin 81 MG chewable tablet Chew 1 tablet (81 mg total) by mouth 2 (two) times daily. 90 tablet 3  . chlorthalidone (HYGROTON) 50 MG tablet Take 1 tablet (50 mg total) by mouth daily. 90 tablet 1  . folic acid (FOLVITE) 1 MG tablet Take 1 tablet (1 mg total) by mouth daily. 30 tablet 2  . HYDROCORTISONE EX  Apply 1 application topically 2 (two) times daily as needed (for razor bumps/rash/itching.).    Marland Kitchen losartan (COZAAR) 100 MG tablet Take 1 tablet (100 mg total) by mouth daily. 90 tablet 1  . methotrexate (RHEUMATREX) 2.5 MG tablet Take 4 tablets (10 mg total) by mouth once a week. Caution:Chemotherapy. Protect from light. 28 tablet 5  . Omega-3 Fatty Acids (FISH OIL) 1000 MG CAPS Take 1,000 mg by mouth daily.    . potassium chloride SA (K-DUR,KLOR-CON) 20 MEQ tablet Take one tablet twice a day for five days, then take one tablet a day. (Patient not taking: Reported on 06/14/2018) 35 tablet 0  . predniSONE (DELTASONE) 20 MG tablet Take 2 tablets (40 mg total) by mouth daily with breakfast. (Patient not taking: Reported on 11/07/2019) 14 tablet 0  . sildenafil (REVATIO) 20 MG tablet Take 1 tablet (20 mg total) by mouth daily. (Patient not taking: Reported on 11/07/2019) 10 tablet 0   No facility-administered medications prior to visit.    Allergies  Allergen Reactions  . Bee Venom Anaphylaxis  . Shellfish Allergy Anaphylaxis and Hives    Review of Systems  Constitutional: Positive for fever.  Respiratory: Positive for shortness of breath.   Cardiovascular: Positive for chest pain and palpitations.  Neurological: Positive for dizziness, weakness and headaches.       Objective:    Physical Exam Vitals reviewed.  Constitutional:      Appearance: He is well-developed.  HENT:     Head: Normocephalic.  Eyes:     Extraocular Movements: Extraocular movements intact.     Pupils: Pupils are equal, round, and reactive to light.  Neurological:     Mental Status: He is alert.     BP 134/76 (BP Location: Right Arm, Patient Position: Sitting, Cuff Size: Normal)   Pulse 91   Temp (!) 97.3 F (36.3 C) (Temporal)   Ht 5' 10.5" (1.791 m)   Wt 151 lb (68.5 kg)   SpO2 96%   BMI 21.36 kg/m  Wt Readings from Last 3 Encounters:  11/20/19 151 lb (68.5 kg)  11/18/19 151 lb (68.5 kg)  11/07/19  151 lb 12.8 oz (68.9 kg)    Health Maintenance Due  Topic Date Due  . COLONOSCOPY  Never done    There are no preventive care reminders to display for this patient.   Lab Results  Component Value Date   TSH 0.678 06/22/2017   Lab Results  Component Value Date   WBC 5.6 11/18/2019  HGB 11.5 (L) 11/18/2019   HCT 36.3 (L) 11/18/2019   MCV 80.8 11/18/2019   PLT 403 (H) 11/18/2019   Lab Results  Component Value Date   NA 132 (L) 11/18/2019   K 3.7 11/18/2019   CO2 25 11/18/2019   GLUCOSE 105 (H) 11/18/2019   BUN 14 11/18/2019   CREATININE 1.37 (H) 11/18/2019   BILITOT 0.2 11/07/2019   ALKPHOS 102 11/07/2019   AST 20 11/07/2019   ALT 12 11/07/2019   PROT 8.0 11/07/2019   ALBUMIN 3.9 11/07/2019   CALCIUM 9.3 11/18/2019   ANIONGAP 11 11/18/2019   Lab Results  Component Value Date   CHOL 187 11/07/2019   Lab Results  Component Value Date   HDL 41 11/07/2019   Lab Results  Component Value Date   LDLCALC 133 (H) 11/07/2019   Lab Results  Component Value Date   TRIG 67 11/07/2019   Lab Results  Component Value Date   CHOLHDL 4.6 11/07/2019   No results found for: HGBA1C     Assessment & Plan:  Dionta was seen today for chest pain and shortness of breath.  Diagnoses and all orders for this visit:  Chest pain, unspecified type Abnormal EKG likely related to uncontrolled hypertension .Right atrial enlargement.   Voltage criteria for LVH  (S(V1)+R(V6) exceeds 3.50 mV)  -Voltage criteria w/o ST/T abnormality may be normal.   -Anterolateral ST-elevation -repolarization variant. -     EKG 12-Lead -     Ambulatory referral to Cardiology -     nitroGLYCERIN (NITROSTAT) 0.4 MG SL tablet; Place 1 tablet (0.4 mg total) under the tongue every 5 (five) minutes as needed for chest pain. -     Brain natriuretic peptide- especially with deep breaths  Anemia, unspecified type Untying cause of fatigue, light headed . Increase iron in diet and sent in iron supplement .    Other orders -     ferrous sulfate 325 (65 FE) MG tablet; Take 1 tablet (325 mg total) by mouth daily with breakfast.    Kerin Perna, NP

## 2019-11-20 NOTE — Patient Instructions (Signed)

## 2019-11-21 LAB — BRAIN NATRIURETIC PEPTIDE: BNP: 3.8 pg/mL (ref 0.0–100.0)

## 2019-11-29 ENCOUNTER — Ambulatory Visit (INDEPENDENT_AMBULATORY_CARE_PROVIDER_SITE_OTHER): Payer: BC Managed Care – PPO | Admitting: Cardiovascular Disease

## 2019-11-29 ENCOUNTER — Encounter: Payer: Self-pay | Admitting: Cardiovascular Disease

## 2019-11-29 ENCOUNTER — Other Ambulatory Visit: Payer: Self-pay

## 2019-11-29 VITALS — BP 136/82 | HR 78 | Ht 70.5 in | Wt 148.0 lb

## 2019-11-29 DIAGNOSIS — I429 Cardiomyopathy, unspecified: Secondary | ICD-10-CM | POA: Diagnosis not present

## 2019-11-29 DIAGNOSIS — R072 Precordial pain: Secondary | ICD-10-CM | POA: Diagnosis not present

## 2019-11-29 DIAGNOSIS — E782 Mixed hyperlipidemia: Secondary | ICD-10-CM

## 2019-11-29 DIAGNOSIS — E785 Hyperlipidemia, unspecified: Secondary | ICD-10-CM | POA: Insufficient documentation

## 2019-11-29 DIAGNOSIS — Z720 Tobacco use: Secondary | ICD-10-CM | POA: Diagnosis not present

## 2019-11-29 LAB — HEPATIC FUNCTION PANEL
ALT: 24 IU/L (ref 0–44)
AST: 23 IU/L (ref 0–40)
Albumin: 3.9 g/dL (ref 3.8–4.9)
Alkaline Phosphatase: 103 IU/L (ref 39–117)
Bilirubin Total: 0.2 mg/dL (ref 0.0–1.2)
Bilirubin, Direct: 0.08 mg/dL (ref 0.00–0.40)
Total Protein: 8.2 g/dL (ref 6.0–8.5)

## 2019-11-29 LAB — LIPID PANEL
Chol/HDL Ratio: 4.8 ratio (ref 0.0–5.0)
Cholesterol, Total: 223 mg/dL — ABNORMAL HIGH (ref 100–199)
HDL: 46 mg/dL (ref >39)
LDL Chol Calc (NIH): 166 mg/dL — ABNORMAL HIGH (ref 0–99)
Triglycerides: 63 mg/dL (ref 0–149)
VLDL Cholesterol Cal: 11 mg/dL (ref 5–40)

## 2019-11-29 NOTE — Assessment & Plan Note (Signed)
His last echo in 2016 showed an EF of 40 to 45% thought to be nonischemic related to sepsis.  We will recheck a 2D echocardiogram.

## 2019-11-29 NOTE — Patient Instructions (Signed)
Medication Instructions:  NO CHANGE *If you need a refill on your cardiac medications before your next appointment, please call your pharmacy*   Lab Work: Your physician recommends that you HAVE LAB WORK TODAY If you have labs (blood work) drawn today and your tests are completely normal, you will receive your results only by: . MyChart Message (if you have MyChart) OR . A paper copy in the mail If you have any lab test that is abnormal or we need to change your treatment, we will call you to review the results.   Testing/Procedures: Your physician has requested that you have an echocardiogram. Echocardiography is a painless test that uses sound waves to create images of your heart. It provides your doctor with information about the size and shape of your heart and how well your heart's chambers and valves are working. This procedure takes approximately one hour. There are no restrictions for this procedure.[address]     Follow-Up: At [facility], you and your health needs are our priority.  As part of our continuing mission to provide you with exceptional heart care, we have created designated Provider Care Teams.  These Care Teams include your primary Cardiologist (physician) and Advanced Practice Providers (APPs -  Physician Assistants and Nurse Practitioners) who all work together to provide you with the care you need, when you need it.  We recommend signing up for the patient portal called "MyChart".  Sign up information is provided on this After Visit Summary.  MyChart is used to connect with patients for Virtual Visits (Telemedicine).  Patients are able to view lab/test results, encounter notes, upcoming appointments, etc.  Non-urgent messages can be sent to your provider as well.   To learn more about what you can do with MyChart, go to [email or web address].    Your next appointment:   3 month(s)  The format for your next appointment:   In Person  Provider:    Brian Crenshaw, MD    

## 2019-11-29 NOTE — Assessment & Plan Note (Signed)
Mr. Vora had an episode of atypical chest pain approximately 2 weeks ago.  He went to the emergency room where he waited for 8 hours and was not seen in the left.  He said no recurrence.  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  At this point, I do not feel compelled to further evaluate this less he has recurrent symptoms.

## 2019-11-29 NOTE — Progress Notes (Signed)
11/29/2019 Dennis Harris   1965-10-03  [id number]  Primary Physician [name], NP Primary Cardiologist: Runell Gess MD Nicholes Calamity, [place]  HPI:  Dennis Harris is a 54 y.o. thin appearing married African-American male father 2, grandfather 2 grandchildren referred by Gwinda Passe, NP for evaluation of atypical chest pain.  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  His risk factors for his CAD include ongoing tobacco abuse of 1/2 pack/day for last 40 years, treated hypertension, untreated hyperlipidemia.  There is no family history of heart disease.  Is never had a heart attack or stroke.  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  He was seen in the hospital by Dr. Royann Shivers 08/03/2014.  He did have a normal cardiac cath in 2010.  2D echo at that time revealed an EF of 40 to 45% thought to be related to sepsis.  He had an episode of chest pain while at work 2 weeks ago.  He went to the emergency room where he sat for 8 hours and left before being seen.  He said no recurrent symptoms.   Current Meds  Medication Sig  . amLODipine (NORVASC) 10 MG tablet Take 1 tablet (10 mg total) by mouth daily.  Marland Kitchen aspirin 81 MG chewable tablet Chew 1 tablet (81 mg total) by mouth 2 (two) times daily.  . chlorthalidone (HYGROTON) 50 MG tablet Take 1 tablet (50 mg total) by mouth daily.  . ferrous sulfate 325 (65 FE) MG tablet Take 1 tablet (325 mg total) by mouth daily with breakfast.  . folic acid (FOLVITE) 1 MG tablet Take 1 tablet (1 mg total) by mouth daily.  Marland Kitchen HYDROCORTISONE EX Apply 1 application topically 2 (two) times daily as needed (for razor bumps/rash/itching.).  Marland Kitchen losartan (COZAAR) 100 MG tablet Take 1 tablet (100 mg total) by mouth daily.  . methotrexate (RHEUMATREX) 2.5 MG tablet Take 4 tablets (10 mg total) by mouth once a week. Caution:Chemotherapy. Protect  from light.  . nitroGLYCERIN (NITROSTAT) 0.4 MG SL tablet Place 1 tablet (0.4 mg total) under the tongue every 5 (five) minutes as needed for chest pain.  . Omega-3 Fatty Acids (FISH OIL) 1000 MG CAPS Take 1,000 mg by mouth daily.     Allergies  Allergen Reactions  . Bee Venom Anaphylaxis  . Shellfish Allergy Anaphylaxis and Hives    Social History   Socioeconomic History  . Marital status: Married    Spouse name: Not on file  . Number of children: Not on file  . Years of education: Not on file  . Highest education level: Not on file  Occupational History  . Not on file  Tobacco Use  . Smoking status: Current Every Day Smoker    Packs/day: 0.50    Years: 37.00    Pack years: 18.50    Types: Cigarettes  . Smokeless tobacco: Never Used  Substance and Sexual Activity  . Alcohol use: Yes    Alcohol/week: 14.0 standard drinks    Types: 14 Cans of beer per week    Comment: 2-3 12 ounce cans of beer per day  . Drug use: Yes    Types: Marijuana    Comment: 12/13/2016 "nothing in years"  . Sexual activity: Yes  Other Topics Concern  . Not on file  Social History Narrative  . Not on file   Social Determinants of Health  Financial Resource Strain:   . Difficulty of Paying Living Expenses:   Food Insecurity:   . Worried About Charity fundraiser in the Last Year:   . Arboriculturist in the Last Year:   Transportation Needs:   . Film/video editor (Medical):   Marland Kitchen Lack of Transportation (Non-Medical):   Physical Activity:   . Days of Exercise per Week:   . Minutes of Exercise per Session:   Stress:   . Feeling of Stress :   Social Connections:   . Frequency of Communication with Friends and Family:   . Frequency of Social Gatherings with Friends and Family:   . Attends Religious Services:   . Active Member of Clubs or Organizations:   . Attends Archivist Meetings:   Marland Kitchen Marital Status:   Intimate Partner Violence:   . Fear of Current or Ex-Partner:   .  Emotionally Abused:   Marland Kitchen Physically Abused:   . Sexually Abused:      Review of Systems: General: negative for chills, fever, night sweats or weight changes.  Cardiovascular: negative for chest pain, dyspnea on exertion, edema, orthopnea, palpitations, paroxysmal nocturnal dyspnea or shortness of breath Dermatological: negative for rash Respiratory: negative for cough or wheezing Urologic: negative for hematuria Abdominal: negative for nausea, vomiting, diarrhea, bright red blood per rectum, melena, or hematemesis Neurologic: negative for visual changes, syncope, or dizziness All other systems reviewed and are otherwise negative except as noted above.    Blood pressure 136/82, pulse 78, height 5' 10.5" (1.791 m), weight 148 lb (67.1 kg), SpO2 97 %.  General appearance: alert and no distress Neck: no adenopathy, no carotid bruit, no JVD, supple, symmetrical, trachea midline and thyroid not enlarged, symmetric, no tenderness/mass/nodules Lungs: clear to auscultation bilaterally Heart: regular rate and rhythm, S1, S2 normal, no murmur, click, rub or gallop Extremities: extremities normal, atraumatic, no cyanosis or edema Pulses: 2+ and symmetric Skin: Skin color, texture, turgor normal. No rashes or lesions Neurologic: Alert and oriented X 3, normal strength and tone. Normal symmetric reflexes. Normal coordination and gait  EKG not performed today.  Recent twelve-lead EKG performed 11/20/2019 revealed sinus rhythm with LVH.  I personally reviewed that EKG.  ASSESSMENT AND PLAN:   Chest pain Dennis Harris had an episode of atypical chest pain approximately 2 weeks ago.  He went to the emergency room where he waited for 8 hours and was not seen in the left.  He said no recurrence.  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  At this point, I do not feel compelled to further evaluate this less  he has recurrent symptoms.  Acute pericarditis Dennis Harris had this in the past and was treated with colchicine back in 2009.  He has not had recurrence.  Cardiomyopathy- EF 40-45% presumed secondary to acute illness His last echo in 2016 showed an EF of 40 to 45% thought to be nonischemic related to sepsis.  We will recheck a 2D echocardiogram.  Hyperlipidemia History of hyperlipidemia in the past not on statin therapy.  We will recheck a fasting lipid liver profile this morning.  Tobacco abuse History of ongoing tobacco abuse of 1/2 pack/day having smoked for 40 years recalcitrant risk factor modification.      Lorretta Harp MD FACP,FACC,FAHA, [facility] 11/29/2019 8:59 AM

## 2019-11-29 NOTE — Assessment & Plan Note (Signed)
History of hyperlipidemia in the past not on statin therapy.  We will recheck a fasting lipid liver profile this morning.

## 2019-11-29 NOTE — Assessment & Plan Note (Signed)
History of ongoing tobacco abuse of 1/2 pack/day having smoked for 40 years recalcitrant risk factor modification.

## 2019-11-29 NOTE — Assessment & Plan Note (Signed)
Dennis Harris had this in the past and was treated with colchicine back in 2009.  He has not had recurrence.

## 2019-12-13 MED FILL — LOSARTAN POTASSIUM 100 MG T: 100 | 30 days supply | Qty: 30 | Fill #1

## 2019-12-13 MED FILL — CHLORTHALIDONE 50 MG TABS: 50 | 30 days supply | Qty: 30 | Fill #1

## 2019-12-13 MED FILL — AMLODIPINE BESYLATE 10 MG T: 10 | 30 days supply | Qty: 30 | Fill #1

## 2019-12-17 ENCOUNTER — Other Ambulatory Visit: Payer: Self-pay

## 2019-12-17 ENCOUNTER — Ambulatory Visit (HOSPITAL_COMMUNITY): Payer: BC Managed Care – PPO | Attending: Cardiology

## 2019-12-17 DIAGNOSIS — R072 Precordial pain: Secondary | ICD-10-CM | POA: Diagnosis not present

## 2019-12-20 ENCOUNTER — Ambulatory Visit: Payer: BC Managed Care – PPO | Admitting: Cardiovascular Disease

## 2019-12-31 ENCOUNTER — Other Ambulatory Visit: Payer: Self-pay

## 2019-12-31 ENCOUNTER — Ambulatory Visit (INDEPENDENT_AMBULATORY_CARE_PROVIDER_SITE_OTHER): Payer: BC Managed Care – PPO | Admitting: Cardiovascular Disease

## 2019-12-31 ENCOUNTER — Encounter: Payer: Self-pay | Admitting: Cardiovascular Disease

## 2019-12-31 DIAGNOSIS — I10 Essential (primary) hypertension: Secondary | ICD-10-CM | POA: Diagnosis not present

## 2019-12-31 DIAGNOSIS — E782 Mixed hyperlipidemia: Secondary | ICD-10-CM

## 2019-12-31 DIAGNOSIS — I429 Cardiomyopathy, unspecified: Secondary | ICD-10-CM

## 2019-12-31 DIAGNOSIS — Z720 Tobacco use: Secondary | ICD-10-CM | POA: Diagnosis not present

## 2019-12-31 MED ORDER — AMLODIPINE BESYLATE 5 MG PO TABS: 5.0000 mg | ORAL_TABLET | Freq: Every day | ORAL | 1 refills | Status: DC

## 2019-12-31 MED ORDER — CHLORTHALIDONE 25 MG PO TABS
25.0000 mg | ORAL_TABLET | Freq: Every day | ORAL | 1 refills | Status: DC
Start: 2019-12-31 — End: 2021-12-28

## 2019-12-31 MED ORDER — CARVEDILOL 3.125 MG PO TABS
3.1250 mg | ORAL_TABLET | Freq: Two times a day (BID) | ORAL | 3 refills | Status: DC
Start: 2019-12-31 — End: 2022-05-11

## 2019-12-31 MED ORDER — ATORVASTATIN CALCIUM 40 MG PO TABS
40.0000 mg | ORAL_TABLET | Freq: Every day | ORAL | 3 refills | Status: DC
Start: 2019-12-31 — End: 2023-07-11

## 2019-12-31 NOTE — Assessment & Plan Note (Signed)
Continue tobacco abuse recalcitrant risk factor modification. 

## 2019-12-31 NOTE — Assessment & Plan Note (Signed)
History of nonischemic cardiomyopathy with a recent echo revealing EF in the 35 to 40% range, slightly lower than his last echo which was 40 to 45% EF 08/04/2014.  He does some feel somewhat fatigued and dyspneic although he is a smoker.  I am going to alter his medications, decrease his amlodipine and chlorthalidone, add low-dose carvedilol and refer him to the Pharm.D.'s for initiation of Entresto and titration of his carvedilol.  We will recheck a 2D echo in 3 months.

## 2019-12-31 NOTE — Assessment & Plan Note (Signed)
History of hyperlipidemia not on statin therapy with lipid profile performed 11/29/2019 revealing total cholesterol of 223, and LDL 166.  I am going to begin him on atorvastatin 40 mg a day and we will recheck a lipid liver profile in 2 months.

## 2019-12-31 NOTE — Patient Instructions (Addendum)
Medication Instructions:  Decrease Amlodipine 5 mg daily  Decrease Chlorthalidone 25 mg daily  Start Carvedilol 3.125 twice daily  Start Atorvastatin 40 mg daily   *If you need a refill on your cardiac medications before your next appointment, please call your pharmacy*  Lab: LIPID/LIVER in 2 months- come fasting, nothing to eat or drink, no appointment needed.   Testing/Procedures: Echocardiogram (3 months, before follow up)  - Your physician has requested that you have an echocardiogram. Echocardiography is a painless test that uses sound waves to create images of your heart. It provides your doctor with information about the size and shape of your heart and how well your heart's chambers and valves are working. This procedure takes approximately one hour. There are no restrictions for this procedure. This will be performed at our [facility] location - [address].   Follow-Up: At [facility], you and your health needs are our priority.  As part of our continuing mission to provide you with exceptional heart care, we have created designated Provider Care Teams.  These Care Teams include your primary Cardiologist (physician) and Advanced Practice Providers (APPs -  Physician Assistants and Nurse Practitioners) who all work together to provide you with the care you need, when you need it.  We recommend signing up for the patient portal called "MyChart".  Sign up information is provided on this After Visit Summary.  MyChart is used to connect with patients for Virtual Visits (Telemedicine).  Patients are able to view lab/test results, encounter notes, upcoming appointments, etc.  Non-urgent messages can be sent to your provider as well.   To learn more about what you can do with MyChart, go to [email or web address].    Your next appointment:   3 month(s)  The format for your next appointment:   In Person  Provider:   Nanetta Batty, MD   Other  Instructions Appointment also needed for PharmD in 2 weeks to discuss titration on  Increasing Coreg, and stopping Losartan starting Entresto.

## 2019-12-31 NOTE — Progress Notes (Signed)
12/31/2019 Dennis Harris   1965/09/30  [id number]  Primary Physician [name], NP Primary Cardiologist: Runell Gess MD [place]am[place], [place]  HPI:  Dennis Harris is a 54 y.o.  thin appearing married African-American male father 2, grandfather 2 grandchildren referred by Gwinda Passe, NP for evaluation of atypical chest pain.    I last saw him in the office 65/7/21.  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  His risk factors for his CAD include ongoing tobacco abuse of 1/2 pack/day for last 40 years, treated hypertension, untreated hyperlipidemia.  There is no family history of heart disease.  Is never had a heart attack or stroke.  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  He was seen in the hospital by Dr. Royann Shivers 08/03/2014.  He did have a normal cardiac cath in 2010.  2D echo at that time revealed an EF of 40 to 45% thought to be related to sepsis.  He had an episode of chest pain while at work approximately 6 weeks ago. He went to the emergency room where he sat for 8 hours and left before being seen.  He said no recurrent symptoms.  I did get a repeat 2D echo performed 5/215/21 revealing ejection fraction of 35 to 40%, somewhat lower than his last EF of 40 to 45% performed 08/04/2014.  Does complain of some fatigue and dyspnea but he also continues to smoke.   Current Meds  Medication Sig  . amLODipine (NORVASC) 10 MG tablet Take 1 tablet (10 mg total) by mouth daily.  Marland Kitchen aspirin 81 MG chewable tablet Chew 1 tablet (81 mg total) by mouth 2 (two) times daily. (Patient taking differently: Chew 81 mg by mouth daily. )  . chlorthalidone (HYGROTON) 50 MG tablet Take 1 tablet (50 mg total) by mouth daily.  . ferrous sulfate 325 (65 FE) MG tablet Take 1 tablet (325 mg total) by mouth daily with breakfast.  . folic acid (FOLVITE) 1 MG tablet Take 1 tablet (1 mg  total) by mouth daily.  Marland Kitchen HYDROCORTISONE EX Apply 1 application topically 2 (two) times daily as needed (for razor bumps/rash/itching.).  Marland Kitchen losartan (COZAAR) 100 MG tablet Take 1 tablet (100 mg total) by mouth daily.  . methotrexate (RHEUMATREX) 2.5 MG tablet Take 4 tablets (10 mg total) by mouth once a week. Caution:Chemotherapy. Protect from light.  . nitroGLYCERIN (NITROSTAT) 0.4 MG SL tablet Place 1 tablet (0.4 mg total) under the tongue every 5 (five) minutes as needed for chest pain.  . Omega-3 Fatty Acids (FISH OIL) 1000 MG CAPS Take 1,000 mg by mouth daily.     Allergies  Allergen Reactions  . Bee Venom Anaphylaxis  . Shellfish Allergy Anaphylaxis and Hives    Social History   Socioeconomic History  . Marital status: Married    Spouse name: Not on file  . Number of children: Not on file  . Years of education: Not on file  . Highest education level: Not on file  Occupational History  . Not on file  Tobacco Use  . Smoking status: Current Every Day Smoker    Packs/day: 0.50    Years: 37.00    Pack years: 18.50    Types: Cigarettes  . Smokeless tobacco: Never Used  Substance and Sexual Activity  . Alcohol use: Yes    Alcohol/week: 14.0 standard drinks    Types:  14 Cans of beer per week    Comment: 2-3 12 ounce cans of beer per day  . Drug use: Yes    Types: Marijuana    Comment: 12/13/2016 "nothing in years"  . Sexual activity: Yes  Other Topics Concern  . Not on file  Social History Narrative  . Not on file   Social Determinants of Health   Financial Resource Strain:   . Difficulty of Paying Living Expenses:   Food Insecurity:   . Worried About Charity fundraiser in the Last Year:   . Arboriculturist in the Last Year:   Transportation Needs:   . Film/video editor (Medical):   Marland Kitchen Lack of Transportation (Non-Medical):   Physical Activity:   . Days of Exercise per Week:   . Minutes of Exercise per Session:   Stress:   . Feeling of Stress :   Social  Connections:   . Frequency of Communication with Friends and Family:   . Frequency of Social Gatherings with Friends and Family:   . Attends Religious Services:   . Active Member of Clubs or Organizations:   . Attends Archivist Meetings:   Marland Kitchen Marital Status:   Intimate Partner Violence:   . Fear of Current or Ex-Partner:   . Emotionally Abused:   Marland Kitchen Physically Abused:   . Sexually Abused:      Review of Systems: General: negative for chills, fever, night sweats or weight changes.  Cardiovascular: negative for chest pain, dyspnea on exertion, edema, orthopnea, palpitations, paroxysmal nocturnal dyspnea or shortness of breath Dermatological: negative for rash Respiratory: negative for cough or wheezing Urologic: negative for hematuria Abdominal: negative for nausea, vomiting, diarrhea, bright red blood per rectum, melena, or hematemesis Neurologic: negative for visual changes, syncope, or dizziness All other systems reviewed and are otherwise negative except as noted above.    Blood pressure 112/60, pulse 76, height 5' 10.5" (1.791 m), weight 151 lb (68.5 kg).  General appearance: alert and no distress Neck: no adenopathy, no carotid bruit, no JVD, supple, symmetrical, trachea midline and thyroid not enlarged, symmetric, no tenderness/mass/nodules Lungs: clear to auscultation bilaterally Heart: regular rate and rhythm, S1, S2 normal, no murmur, click, rub or gallop Extremities: extremities normal, atraumatic, no cyanosis or edema Pulses: 2+ and symmetric Skin: Skin color, texture, turgor normal. No rashes or lesions Neurologic: Alert and oriented X 3, normal strength and tone. Normal symmetric reflexes. Normal coordination and gait  EKG not performed today  ASSESSMENT AND PLAN:   Cardiomyopathy- EF 40-45% presumed secondary to acute illness History of nonischemic cardiomyopathy with a recent echo revealing EF in the 35 to 40% range, slightly lower than his last echo  which was 40 to 45% EF 08/04/2014.  He does some feel somewhat fatigued and dyspneic although he is a smoker.  I am going to alter his medications, decrease his amlodipine and chlorthalidone, add low-dose carvedilol and refer him to the Pharm.D.'s for initiation of Entresto and titration of his carvedilol.  We will recheck a 2D echo in 3 months.  Hyperlipidemia History of hyperlipidemia not on statin therapy with lipid profile performed 11/29/2019 revealing total cholesterol of 223, and LDL 166.  I am going to begin him on atorvastatin 40 mg a day and we will recheck a lipid liver profile in 2 months.  Tobacco abuse Continue tobacco abuse recalcitrant risk factor modification.      Lorretta Harp MD FACP,FACC,FAHA, [facility] 12/31/2019 2:03 PM

## 2020-01-13 ENCOUNTER — Encounter: Payer: Self-pay | Admitting: Family Medicine

## 2020-01-13 ENCOUNTER — Other Ambulatory Visit: Payer: Self-pay

## 2020-01-13 ENCOUNTER — Ambulatory Visit: Payer: Self-pay

## 2020-01-13 ENCOUNTER — Ambulatory Visit (INDEPENDENT_AMBULATORY_CARE_PROVIDER_SITE_OTHER): Payer: BC Managed Care – PPO | Admitting: Family Medicine

## 2020-01-13 DIAGNOSIS — M25551 Pain in right hip: Secondary | ICD-10-CM | POA: Diagnosis not present

## 2020-01-13 DIAGNOSIS — Z96641 Presence of right artificial hip joint: Secondary | ICD-10-CM | POA: Diagnosis not present

## 2020-01-13 MED ORDER — NABUMETONE 750 MG PO TABS
750.0000 mg | ORAL_TABLET | Freq: Two times a day (BID) | ORAL | 6 refills | Status: DC | PRN
Start: 2020-01-13 — End: 2022-01-31

## 2020-01-13 MED ORDER — TIZANIDINE HCL 2 MG PO TABS: 2.0000 mg | ORAL_TABLET | Freq: Four times a day (QID) | ORAL | 1 refills | Status: DC | PRN

## 2020-01-13 MED ORDER — VITAMIN D-3 125 MCG (5000 UT) PO TABS: 1.0000 | ORAL_TABLET | Freq: Every day | ORAL | 3 refills | Status: DC

## 2020-01-13 NOTE — Progress Notes (Signed)
Office Visit Note   Patient: Dennis Harris           Date of Birth: June 26, 1966           MRN: [id number] Visit Date: 01/13/2020 Requested by: Kerin Perna, NP [address] [place],  [place] [address] PCP: Kerin Perna, NP  Subjective: Chief Complaint  Patient presents with  . Right Hip - Pain    Groin pain x couple months. 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 NKI. Wakes him at night.    HPI: He is here with right hip pain.  He is status post replacement in May 2018 per Dr. Ninfa Linden.  He did very well, not completely pain-free but with very minimal symptoms until a couple months ago.  There was no injury.  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  He has been doing this job for about 5 years.  He has pain in the groin area constantly, and sometimes keeps him awake at night.  He has been using topical remedies, lots of ibuprofen.              ROS: No fevers or chills.  All other systems were reviewed and are negative.  Objective: Vital Signs: There were no vitals taken for this visit.  Physical Exam:  General:  Alert and oriented, in no acute distress. Pulm:  Breathing unlabored. Psy:  Normal mood, congruent affect. Skin: No rash Right hip: He has slight weakness with hip flexion on the right compared to the left.  This also causes some pain.  Gentle passive range of motion of the hip did not elicit any pain.  There is no tenderness over the greater trochanter.  Lower extremity strength and reflexes are normal.  Imaging: XR HIP UNILAT W OR W/O PELVIS 2-3 VIEWS RIGHT  Result Date: 01/13/2020 X-rays right hip reveal intact prosthesis with no sign of loosening or osteomyelitis.  No sign of stress fracture.  No change compared to 2019 x-rays.   Assessment & Plan: 1.  Right hip pain status post replacement -We will keep him out of work for a few weeks.  Trial of Relafen, empirically treat with vitamin D3. -If symptoms persist then would consider additional work-up with  labs and possibly MRI scan. -He is presently working on smoking cessation.  He is down to about 8 cigarettes/day.  Encouraged him to continue working on that.     Procedures: No procedures performed  No notes on file     PMFS History: Patient Active Problem List   Diagnosis Date Noted  . Hyperlipidemia 11/29/2019  . Tobacco abuse 11/29/2019  . Avascular necrosis of hip, right ([place]) 12/13/2016  . 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 Pain of left hip joint 12/07/2016  . Pain of right hip joint 12/07/2016  . SIRS (systemic inflammatory response syndrome) ([place]) 08/07/2014  . Cardiomyopathy- EF 40-45% presumed secondary to acute illness 08/07/2014  . CAP (community acquired pneumonia)   . Right upper quadrant pain   . Acute pericarditis 08/04/2014  . Acute hyponatremia 08/04/2014  . Protein-calorie malnutrition, severe ([place]) 08/04/2014  . Sepsis ([place]) 08/03/2014  . Legionella pneumonia ([place]) 08/03/2014  . Rheumatoid arthritis ([place]) 08/03/2014  . Chest pain 08/03/2014  . Elevated troponin- not fel to be secondary to MI 08/03/2014  . Nausea vomiting and diarrhea 08/03/2014  . Cervical spondylosis without myelopathy 11/06/2012   Past Medical History:  Diagnosis  Date  . 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 Exertional shortness of breath    "at times" (11/14/2012)  . Hypercholesteremia   . Hypertension   . Pericarditis ~ 2009  . Pneumonia   . Rheumatoid arthritis(714.0)     Family History  Problem Relation Age of Onset  . Cirrhosis Father   . Lupus Sister   . Lupus Sister   . Hypertension Mother   . Stroke Mother   . Diabetes Mother     Past Surgical History:  Procedure Laterality Date  . ANTERIOR CERVICAL DECOMP/DISCECTOMY FUSION N/A 11/14/2012   Procedure: ANTERIOR CERVICAL DECOMPRESSION/DISCECTOMY FUSION 1 LEVEL C5-6;  Surgeon: Venita Lick, MD;  Location: [facility] OR;   Service: Orthopedics;  Laterality: N/A;  . APPENDECTOMY  1970's  . BACK SURGERY    . CARDIAC CATHETERIZATION  May 2010   normal  . JOINT REPLACEMENT    . TONSILLECTOMY  1970's?  . 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 TOTAL HIP ARTHROPLASTY Right 12/13/2016   Procedure: RIGHT TOTAL HIP ARTHROPLASTY ANTERIOR APPROACH;  Surgeon: Kathryne Hitch, MD;  Location: [facility] OR;  Service: Orthopedics;  Laterality: Right;   Social History   Occupational History  . Not on file  Tobacco Use  . Smoking status: Current Every Day Smoker    Packs/day: 0.50    Years: 37.00    Pack years: 18.50    Types: Cigarettes  . Smokeless tobacco: Never Used  Vaping Use  . Vaping Use: Never used  Substance and Sexual Activity  . Alcohol use: Yes    Alcohol/week: 14.0 standard drinks    Types: 14 Cans of beer per week    Comment: 2-3 12 ounce cans of beer per day  . Drug use: Yes    Types: Marijuana    Comment: 12/13/2016 "nothing in years"  . Sexual activity: Yes

## 2020-01-14 ENCOUNTER — Ambulatory Visit: Payer: BC Managed Care – PPO

## 2020-01-14 MED FILL — tiZANidine HCL 2 MG TABS: 2 | 7 days supply | Qty: 60 | Fill #0

## 2020-01-14 MED FILL — NABUMETONE 750 MG TABS: 750 | 30 days supply | Qty: 60 | Fill #0

## 2020-01-23 ENCOUNTER — Ambulatory Visit: Payer: BC Managed Care – PPO

## 2020-01-23 NOTE — Progress Notes (Deleted)
Patient ID: Dennis Harris                 DOB: 12-Jun-1966                      MRN: [id number]     HPI: Dennis Harris is a 54 y.o. male referred by Dr. Allyson Sabal to pharmacy clinic for HF medication management. PMH is significant for CHF, HLD, tobacco abuse. Most recent LVEF 35-40% on echo.  Today he presents to pharmacy clinic for further medication titration. At last visit with MD amlodipine and chlorthalidone was decreased and carvedilol 3.125mg  BID was started. Plan was to start Entresto at today's visit. Symptomatically, he is feeling ***, *** dizziness, lightheadedness, and fatigue. *** chest pain or palpitations. Feels SOB when ***. Able to complete all ADLs. Activity level ***. She *** checks her weight at home (normal range *** - *** lbs). *** LEE, PND, or orthopnea. Appetite has been ***. She *** adheres to a low-salt diet.  Entresto- Nurse, learning disability Decrease chlorthalidone? Home BP readings? Dizziness, lightheadedness, headache, blurred vision, SOB, swelling   Current CHF meds: carvedilol 3.125, losartan 100mg   Other BP medications: chlorthalidone 25mg  daily, amlodipine 5mg  daily Previously tried:  BP goal: <130/80  Family History:  Family History  Problem Relation Age of Onset   Cirrhosis Father    Lupus Sister    Lupus Sister    Hypertension Mother    Stroke Mother    Diabetes Mother      Social History: current smoker, 2-3 cans of beer per day  Diet:   Exercise:   Home BP readings:   Wt Readings from Last 3 Encounters:  12/31/19 151 lb (68.5 kg)  11/29/19 148 lb (67.1 kg)  11/20/19 151 lb (68.5 kg)   BP Readings from Last 3 Encounters:  12/31/19 112/60  11/29/19 136/82  11/20/19 134/76   Pulse Readings from Last 3 Encounters:  12/31/19 76  11/29/19 78  11/20/19 91    Renal function: CrCl cannot be calculated (Patient's most recent lab result is older than the maximum 21 days allowed.).  Past Medical History:  Diagnosis Date     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   Exertional shortness of breath    "at times" (11/14/2012)   Hypercholesteremia    Hypertension    Pericarditis ~ 2009   Pneumonia    Rheumatoid arthritis(714.0)     Current Outpatient Medications on File Prior to Visit  Medication Sig Dispense Refill   amLODipine (NORVASC) 5 MG tablet Take 1 tablet (5 mg total) by mouth daily. 90 tablet 1   aspirin 81 MG chewable tablet Chew 1 tablet (81 mg total) by mouth 2 (two) times daily. (Patient taking differently: Chew 81 mg by mouth daily. ) 90 tablet 3   atorvastatin (LIPITOR) 40 MG tablet Take 1 tablet (40 mg total) by mouth daily. 90 tablet 3   carvedilol (COREG) 3.125 MG tablet Take 1 tablet (3.125 mg total) by mouth 2 (two) times daily. 180 tablet 3   chlorthalidone (HYGROTON) 25 MG tablet Take 1 tablet (25 mg total) by mouth daily. 90 tablet 1   Cholecalciferol (VITAMIN D-3) 125 MCG (5000 UT) TABS Take 1 tablet by mouth daily. 90 tablet 3   ferrous sulfate 325 (65 FE) MG tablet Take 1 tablet (325 mg total) by mouth daily with breakfast. 90 tablet 3   folic acid (FOLVITE) 1 MG tablet Take 1 tablet (  1 mg total) by mouth daily. 30 tablet 2   HYDROCORTISONE EX Apply 1 application topically 2 (two) times daily as needed (for razor bumps/rash/itching.).     losartan (COZAAR) 100 MG tablet Take 1 tablet (100 mg total) by mouth daily. 90 tablet 1   methotrexate (RHEUMATREX) 2.5 MG tablet Take 4 tablets (10 mg total) by mouth once a week. Caution:Chemotherapy. Protect from light. 28 tablet 5   nabumetone (RELAFEN) 750 MG tablet Take 1 tablet (750 mg total) by mouth 2 (two) times daily as needed. 60 tablet 6   nitroGLYCERIN (NITROSTAT) 0.4 MG SL tablet Place 1 tablet (0.4 mg total) under the tongue every 5 (five) minutes as needed for chest pain. 50 tablet 3   Omega-3 Fatty Acids (FISH OIL) 1000 MG CAPS Take 1,000 mg by mouth daily.     tiZANidine (ZANAFLEX) 2 MG tablet Take  1-2 tablets (2-4 mg total) by mouth every 6 (six) hours as needed for muscle spasms. 60 tablet 1   No current facility-administered medications on file prior to visit.    Allergies  Allergen Reactions   Bee Venom Anaphylaxis   Shellfish Allergy Anaphylaxis and Hives     Assessment/Plan:  1. CHF -

## 2020-02-05 ENCOUNTER — Ambulatory Visit (INDEPENDENT_AMBULATORY_CARE_PROVIDER_SITE_OTHER): Payer: BC Managed Care – PPO | Admitting: Family Medicine

## 2020-02-05 ENCOUNTER — Encounter: Payer: Self-pay | Admitting: Family Medicine

## 2020-02-05 ENCOUNTER — Other Ambulatory Visit: Payer: Self-pay

## 2020-02-05 DIAGNOSIS — M25551 Pain in right hip: Secondary | ICD-10-CM

## 2020-02-05 DIAGNOSIS — Z96641 Presence of right artificial hip joint: Secondary | ICD-10-CM | POA: Diagnosis not present

## 2020-02-05 DIAGNOSIS — Z471 Aftercare following joint replacement surgery: Secondary | ICD-10-CM

## 2020-02-05 NOTE — Progress Notes (Signed)
Still in pain No improvement

## 2020-02-05 NOTE — Progress Notes (Signed)
Office Visit Note   Patient: Dennis Harris           Date of Birth: 04/12/1966           MRN: [id number] Visit Date: 02/05/2020 Requested by: [name], NP [address] [place],  [place] [address] PCP: [name], NP  Subjective: Chief Complaint  Patient presents with  . Right Hip - Pain    HPI: He is here with persistent right hip pain.  Despite taking some time off work, his pain has persisted.  Anterior pain with weightbearing.  No fevers or chills.              ROS:   All other systems were reviewed and are negative.  Objective: Vital Signs: There were no vitals taken for this visit.  Physical Exam:  General:  Alert and oriented, in no acute distress. Pulm:  Breathing unlabored. Psy:  Normal mood, congruent affect.  Right hip: He has a little bit of tenderness to palpation over the anterior hip joint.  He has pain with hip flexion against resistance but his strength is good.  He has pain with passive hip flexion and internal rotation.  Imaging: No results found.  Assessment & Plan: 1.  Right hip pain approximately 2 years status post replacement, cannot rule out joint infection or loosening of the prosthesis. -We will draw some labs and order MRI scan.  Out of work 1 month while awaiting results.  I will discuss all of his results with Dr. Magnus Ivan.     Procedures: No procedures performed  No notes on file     PMFS History: Patient Active Problem List   Diagnosis Date Noted  . Hyperlipidemia 11/29/2019  . Tobacco abuse 11/29/2019  . Avascular necrosis of hip, right (HCC) 12/13/2016  . 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 Avascular necrosis of bones of both hips (HCC) 12/07/2016  . Pain of left hip joint 12/07/2016  . Pain of right hip joint 12/07/2016  . SIRS (systemic inflammatory response syndrome) (HCC) 08/07/2014  . Cardiomyopathy- EF 40-45% presumed secondary to acute illness 08/07/2014  . CAP  (community acquired pneumonia)   . Right upper quadrant pain   . Acute pericarditis 08/04/2014  . Acute hyponatremia 08/04/2014  . Protein-calorie malnutrition, severe (HCC) 08/04/2014  . Sepsis (HCC) 08/03/2014  . Legionella pneumonia (HCC) 08/03/2014  . Rheumatoid arthritis (HCC) 08/03/2014  . Chest pain 08/03/2014  . Elevated troponin- not fel to be secondary to MI 08/03/2014  . Nausea vomiting and diarrhea 08/03/2014  . Cervical spondylosis without myelopathy 11/06/2012   Past Medical History:  Diagnosis Date  . 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 Exertional shortness of breath    "at times" (11/14/2012)  . Hypercholesteremia   . Hypertension   . Pericarditis ~ 2009  . Pneumonia   . Rheumatoid arthritis(714.0)     Family History  Problem Relation Age of Onset  . Cirrhosis Father   . Lupus Sister   . Lupus Sister   . Hypertension Mother   . Stroke Mother   . Diabetes Mother     Past Surgical History:  Procedure Laterality Date  . ANTERIOR CERVICAL DECOMP/DISCECTOMY FUSION N/A 11/14/2012   Procedure: ANTERIOR CERVICAL DECOMPRESSION/DISCECTOMY FUSION 1 LEVEL C5-6;  Surgeon: Venita Lick, MD;  Location: [facility] OR;  Service: Orthopedics;  Laterality: N/A;  . APPENDECTOMY  1970's  . BACK SURGERY    . CARDIAC  CATHETERIZATION  May 2010   normal  . JOINT REPLACEMENT    . TONSILLECTOMY  1970's?  . 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 TOTAL HIP ARTHROPLASTY Right 12/13/2016   Procedure: RIGHT TOTAL HIP ARTHROPLASTY ANTERIOR APPROACH;  Surgeon: Kathryne Hitch, MD;  Location: [facility] OR;  Service: Orthopedics;  Laterality: Right;   Social History   Occupational History  . Not on file  Tobacco Use  . Smoking status: Current Every Day Smoker    Packs/day: 0.50    Years: 37.00    Pack years: 18.50    Types: Cigarettes  . Smokeless tobacco: Never Used  Vaping Use  . Vaping Use: Never used  Substance and Sexual Activity  . Alcohol use: Yes     Alcohol/week: 14.0 standard drinks    Types: 14 Cans of beer per week    Comment: 2-3 12 ounce cans of beer per day  . Drug use: Yes    Types: Marijuana    Comment: 12/13/2016 "nothing in years"  . Sexual activity: Yes

## 2020-02-06 ENCOUNTER — Telehealth: Payer: Self-pay | Admitting: Family Medicine

## 2020-02-06 LAB — CBC WITH DIFFERENTIAL/PLATELET
Absolute Monocytes: 506 cells/uL (ref 200–950)
Basophils Absolute: 46 cells/uL (ref 0–200)
Basophils Relative: 0.5 %
Eosinophils Absolute: 74 cells/uL (ref 15–500)
Eosinophils Relative: 0.8 %
HCT: 38.2 % — ABNORMAL LOW (ref 38.5–50.0)
Hemoglobin: 11.8 g/dL — ABNORMAL LOW (ref 13.2–17.1)
Lymphs Abs: 1601 cells/uL (ref 850–3900)
MCH: 25.4 pg — ABNORMAL LOW (ref 27.0–33.0)
MCHC: 30.9 g/dL — ABNORMAL LOW (ref 32.0–36.0)
MCV: 82.3 fL (ref 80.0–100.0)
MPV: 8.7 fL (ref 7.5–12.5)
Monocytes Relative: 5.5 %
Neutro Abs: 6974 cells/uL (ref 1500–7800)
Neutrophils Relative %: 75.8 %
Platelets: 387 10*3/uL (ref 140–400)
RBC: 4.64 10*6/uL (ref 4.20–5.80)
RDW: 13.6 % (ref 11.0–15.0)
Total Lymphocyte: 17.4 %
WBC: 9.2 10*3/uL (ref 3.8–10.8)

## 2020-02-06 LAB — C-REACTIVE PROTEIN: CRP: 25.2 mg/L — ABNORMAL HIGH (ref <8.0)

## 2020-02-06 LAB — SEDIMENTATION RATE: Sed Rate: 110 mm/h — ABNORMAL HIGH (ref 0–20)

## 2020-02-06 NOTE — Telephone Encounter (Signed)
Inflammatory markers (CRP and sed rate) are very high.  This raises concern for infection, so it is important that we proceed with MRI.

## 2020-02-08 ENCOUNTER — Ambulatory Visit
Admission: RE | Admit: 2020-02-08 | Discharge: 2020-02-08 | Disposition: A | Discharge status: Home / Self Care | Payer: BC Managed Care – PPO | Source: Ambulatory Visit | Attending: Family Medicine | Admitting: Family Medicine

## 2020-02-08 ENCOUNTER — Other Ambulatory Visit: Payer: Self-pay

## 2020-02-08 DIAGNOSIS — Z471 Aftercare following joint replacement surgery: Secondary | ICD-10-CM | POA: Diagnosis not present

## 2020-02-08 DIAGNOSIS — Z96641 Presence of right artificial hip joint: Secondary | ICD-10-CM | POA: Diagnosis not present

## 2020-02-08 DIAGNOSIS — M7071 Other bursitis of hip, right hip: Secondary | ICD-10-CM | POA: Diagnosis not present

## 2020-02-10 ENCOUNTER — Telehealth: Payer: Self-pay | Admitting: Family Medicine

## 2020-02-10 DIAGNOSIS — Z96641 Presence of right artificial hip joint: Secondary | ICD-10-CM

## 2020-02-10 DIAGNOSIS — M25551 Pain in right hip: Secondary | ICD-10-CM

## 2020-02-10 NOTE — Telephone Encounter (Signed)
MRI shows some bursitis in the hip area, but the replacement looks good.  Would recommend starting physical therapy.  If pain doesn't improve, could do a cortisone injection.

## 2020-02-12 ENCOUNTER — Ambulatory Visit: Payer: BC Managed Care – PPO | Admitting: Physical Therapy

## 2020-02-18 ENCOUNTER — Ambulatory Visit (INDEPENDENT_AMBULATORY_CARE_PROVIDER_SITE_OTHER): Payer: BC Managed Care – PPO | Admitting: Rehabilitative and Restorative Service Providers"

## 2020-02-18 ENCOUNTER — Encounter: Payer: Self-pay | Admitting: Rehabilitative and Restorative Service Providers"

## 2020-02-18 ENCOUNTER — Other Ambulatory Visit: Payer: Self-pay

## 2020-02-18 DIAGNOSIS — M6281 Muscle weakness (generalized): Secondary | ICD-10-CM

## 2020-02-18 DIAGNOSIS — M25551 Pain in right hip: Secondary | ICD-10-CM

## 2020-02-18 DIAGNOSIS — R262 Difficulty in walking, not elsewhere classified: Secondary | ICD-10-CM

## 2020-02-18 NOTE — Therapy (Signed)
[facility] Physical Therapy [address] [place], [place], [address] Phone: [phone]   Fax:  [phone]  Physical Therapy Evaluation  Patient Details  Name: Dennis Harris MRN: [id number] Date of Birth: Jun 22, 1966 Referring Provider (PT): Dr. Prince Rome   Encounter Date: 02/18/2020   PT End of Session - 02/18/20 1358    Visit Number 1    Number of Visits 12    Date for PT Re-Evaluation 03/31/20    Progress Note Due on Visit 10    PT Start Time 1410    PT Stop Time 1454    PT Time Calculation (min) 44 min    Activity Tolerance Patient tolerated treatment well    Behavior During Therapy [facility] for tasks assessed/performed           Past Medical History:  Diagnosis Date  . 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 Exertional shortness of breath    "at times" (11/14/2012)  . Hypercholesteremia   . Hypertension   . Pericarditis ~ 2009  . Pneumonia   . Rheumatoid arthritis(714.0)     Past Surgical History:  Procedure Laterality Date  . ANTERIOR CERVICAL DECOMP/DISCECTOMY FUSION N/A 11/14/2012   Procedure: ANTERIOR CERVICAL DECOMPRESSION/DISCECTOMY FUSION 1 LEVEL C5-6;  Surgeon: Venita Lick, MD;  Location: [facility] OR;  Service: Orthopedics;  Laterality: N/A;  . APPENDECTOMY  1970's  . BACK SURGERY    . CARDIAC CATHETERIZATION  May 2010   normal  . JOINT REPLACEMENT    . TONSILLECTOMY  1970's?  . 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 TOTAL HIP ARTHROPLASTY Right 12/13/2016   Procedure: RIGHT TOTAL HIP ARTHROPLASTY ANTERIOR APPROACH;  Surgeon: Kathryne Hitch, MD;  Location: [facility] OR;  Service: Orthopedics;  Laterality: Right;    There were no vitals filed for this visit.    Subjective Assessment - 02/18/20 1411    Subjective Pt. indicated symptoms for about 6 months or so, insidious worsening.  Pt. 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  Pt. stated having symptoms cause more difficulty leading to MD appointment and removal from work due to symptoms.  Pt. stated additional incident of lifting and walking down stairs with clothes up to 10 lbs leading to severe Rt hip pain.  MRI was performed showing bursitis.  Therapy was recommend with possible injection.    Pertinent History Rt THA 2018    Limitations Walking;Lifting;Standing    Diagnostic tests MRI    Patient Stated Goals Reduce pain    Currently in Pain? Yes    Pain Score 1    at worst 7/10.   Pain Location Hip    Pain Orientation Anterior    Pain Descriptors / Indicators Aching;Tightness    Pain Type Chronic pain    Pain Onset More than a month ago    Pain Frequency Intermittent    Aggravating Factors  lifting, standing, walking prolonged, stairs, waking up in morning trouble, transfers after prolonged sitting    Pain Relieving Factors medicine at times, rest    Effect of Pain on Daily Activities Limited for work, standing/walking              [facility] PT Assessment - 02/18/20 0001      Assessment   Medical Diagnosis Rt hip pain, bursitis    Referring Provider (PT) Dr. Prince Rome    Onset Date/Surgical Date 08/26/19    Hand  Dominance Right    Prior Therapy therapy after anterior hip replacement      Precautions   Precautions None      Restrictions   Weight Bearing Restrictions No      Balance Screen   Has the patient fallen in the past 6 months No    Has the patient had a decrease in activity level because of a fear of falling?  Yes   2/2 pain symptoms   Is the patient reluctant to leave their home because of a fear of falling?  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, carrying, forklift up/down    Leisure Fishing (unable at this time)      Cognition   Overall Cognitive Status Within Functional Limits for tasks assessed      Functional Tests   Functional tests Single leg stance;Sit to Stand      Single Leg Stance   Comments Rt and Lt SLS 15 seconds c mild aberrant postural sway      ROM / Strength   AROM / PROM / Strength Strength;PROM;AROM      AROM   AROM Assessment Site Hip    Right/Left Hip Left;Right      PROM   PROM Assessment Site Hip    Right/Left Hip Left;Right    Right Hip Flexion 115    Right Hip External Rotation  55    Right Hip Internal Rotation  30    Left Hip External Rotation  60    Left Hip Internal Rotation  35      Strength   Strength Assessment Site Ankle;Knee;Hip    Right/Left Hip Left;Right    Right Hip Flexion 4/5    Left Hip Flexion 5/5    Right/Left Knee Left;Right    Right Knee Flexion 5/5    Right Knee Extension 4+/5    Left Knee Flexion 5/5    Left Knee Extension 5/5    Right/Left Ankle Left;Right    Right Ankle Dorsiflexion 5/5    Left Ankle Dorsiflexion 5/5      Palpation   Palpation comment TrP noted c concordant pain proximal rectus femoris, pectineus Rt      Special Tests   Other special tests + Figure 4 FABER on Rt in sitting      Transfers   Comments Able to perform sit to stand from 18 inch chair s UE assist      Ambulation/Gait   Gait Comments Independent c mild trendelenburg on Rt in stance, mild reduction of stance time on Rt c resulting Lt LE step length decrease                      Objective measurements completed on examination: See above findings.       [facility] Adult PT Treatment/Exercise - 02/18/20 0001      Exercises   Exercises Other Exercises    Other Exercises  HEP instruction/performance consisting of supine modified thomas stretch 30 sec  x5, sidelying  clamshell 3 x 10, seated SLR 3 x 10 bilateral, supine figure 4 30 sec x 5      Manual Therapy   Manual therapy comments Compression to proximal rectus femoris Rt                  PT Education - 02/18/20 1358    Education Details HEP, POC    Person(s) Educated Patient    Methods Explanation;Demonstration;Handout;Verbal cues    Comprehension Verbalized understanding;Returned demonstration               PT Long Term Goals - 02/18/20 1359      PT LONG TERM GOAL #1   Title Patient will demonstrate/report pain at worst less than or equal to 2/10 to facilitate minimal limitation in daily activity secondary to pain symptoms.    Status New    Target Date 03/31/20      PT LONG TERM GOAL #2   Title Patient will demonstrate independent use of home exercise program to facilitate ability to maintain/progress functional gains from skilled physical therapy services.    Status New    Target Date 03/31/20      PT LONG TERM GOAL #3   Title Patient will demonstrate return to work/recreational activity at previous level of function without limitations secondary due to condition    Status New    Target Date 03/31/20      PT LONG TERM GOAL #4   Title Pt. will demonstrate Rt hip AROM s symptoms to facilitate usual mobility at PLOF.    Status New    Target Date 03/31/20      PT LONG TERM GOAL #5   Title Pt. will demonstrate Rt LE MMT 5/5 throughout to facilitate usual standing, walking, stairs at PLOF s limitation.    Status New    Target Date 03/31/20      Additional Long Term Goals   Additional Long Term Goals Yes      PT LONG TERM GOAL #6   Title Pt. will demonstrate reciprocal ascending/descending stairs at PLOF s limitation for forklift, household navigation.    Status New    Target Date 03/31/20                  Plan - 02/18/20 1359    Clinical Impression Statement Patient is a 54 y.o. male who comes to clinic with complaints of Rt hip pain with mobility,  strength and movement coordination deficits that impair their ability to perform usual daily and recreational functional activities without increase difficulty/symptoms at this time.  Patient to benefit from skilled PT services to address impairments and limitations to improve to previous level of function without restriction secondary to condition.    Personal Factors and Comorbidities Other   Rt THA approx. 2 years   Examination-Activity Limitations Sit;Bend;Stairs;Squat;Stand;Transfers;Locomotion Level;Lift;Carry    Examination-Participation Restrictions Community Activity;Other   work   Stability/Clinical Decision Making Stable/Uncomplicated    Clinical Decision Making Low    Rehab Potential Good    PT Frequency --   1-2 x/week   PT Duration 6 weeks    PT Treatment/Interventions ADLs/Self Care Home Management;Cryotherapy;Electrical Stimulation;Iontophoresis 4mg /ml Dexamethasone;Balance training;Moist Heat;Therapeutic activities;Functional mobility training;Ultrasound;Taping;Dry needling;Passive range of motion;Spinal Manipulations;Joint Manipulations;Patient/family education;Manual techniques;Therapeutic exercise    PT Next Visit Plan Reassess HEP, DN ?    PT Home Exercise Plan    Consulted and Agree with Plan of Care Patient  Patient will benefit from skilled therapeutic intervention in order to improve the following deficits and impairments:  Abnormal gait, Decreased endurance, Hypomobility, Decreased activity tolerance, Decreased strength, Pain, Difficulty walking, Decreased mobility, Decreased balance, Decreased range of motion, Impaired perceived functional ability, Impaired flexibility, Decreased coordination, Increased muscle spasms  Visit Diagnosis: Pain in right hip  Muscle weakness (generalized)  Difficulty in walking, not elsewhere classified     Problem List Patient Active Problem List   Diagnosis Date Noted  . Hyperlipidemia 11/29/2019  . Tobacco  abuse 11/29/2019  . Avascular necrosis of hip, right (HCC) 12/13/2016  . 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 Avascular necrosis of bones of both hips (HCC) 12/07/2016  . Pain of left hip joint 12/07/2016  . Pain of right hip joint 12/07/2016  . SIRS (systemic inflammatory response syndrome) (HCC) 08/07/2014  . Cardiomyopathy- EF 40-45% presumed secondary to acute illness 08/07/2014  . CAP (community acquired pneumonia)   . Right upper quadrant pain   . Acute pericarditis 08/04/2014  . Acute hyponatremia 08/04/2014  . Protein-calorie malnutrition, severe (HCC) 08/04/2014  . Sepsis (HCC) 08/03/2014  . Legionella pneumonia (HCC) 08/03/2014  . Rheumatoid arthritis (HCC) 08/03/2014  . Chest pain 08/03/2014  . Elevated troponin- not fel to be secondary to MI 08/03/2014  . Nausea vomiting and diarrhea 08/03/2014  . Cervical spondylosis without myelopathy 11/06/2012   Chyrel Masson, PT, DPT, OCS, ATC 02/18/20  3:04 PM    [facility] Physical Therapy [address] [place], [place], [address] Phone: [phone]   Fax:  [phone]  Name: Dennis Harris MRN: [id number] Date of Birth: 1966-05-30

## 2020-02-18 NOTE — Patient Instructions (Signed)
Access Code: [email or web address] URL: [email or web address] Date: 02/18/2020 Prepared by: Chyrel Masson  Exercises Modified Maisie Fus Stretch - 2 x daily - 7 x weekly - 1 sets - 5 reps - 30 hold Clamshell - 1 x daily - 7 x weekly - 3 sets - 10 reps Supine Figure 4 Piriformis Stretch - 2 x daily - 7 x weekly - 1 sets - 5 reps - 30 hold Seated Straight Leg Heel Taps - 1 x daily - 7 x weekly - 3 sets - 10 reps

## 2020-02-24 ENCOUNTER — Ambulatory Visit: Payer: BC Managed Care – PPO | Admitting: Physical Therapy

## 2020-02-24 ENCOUNTER — Telehealth: Payer: Self-pay | Admitting: Cardiology

## 2020-02-24 NOTE — Telephone Encounter (Signed)
Spoke with patient regarding appointment for Monday 03/02/20 with Dr. Burley Saver patient this was scheduled in error and to keep the 04/14/20 appointment with Dr. Allyson Sabal.  He voiced his understanding.

## 2020-03-02 ENCOUNTER — Ambulatory Visit: Payer: BC Managed Care – PPO | Admitting: Cardiology

## 2020-03-04 ENCOUNTER — Ambulatory Visit (INDEPENDENT_AMBULATORY_CARE_PROVIDER_SITE_OTHER): Payer: BC Managed Care – PPO | Admitting: Physical Therapy

## 2020-03-04 ENCOUNTER — Other Ambulatory Visit: Payer: Self-pay

## 2020-03-04 ENCOUNTER — Telehealth: Payer: Self-pay

## 2020-03-04 DIAGNOSIS — R262 Difficulty in walking, not elsewhere classified: Secondary | ICD-10-CM | POA: Diagnosis not present

## 2020-03-04 DIAGNOSIS — M25551 Pain in right hip: Secondary | ICD-10-CM | POA: Diagnosis not present

## 2020-03-04 DIAGNOSIS — M6281 Muscle weakness (generalized): Secondary | ICD-10-CM

## 2020-03-04 NOTE — Telephone Encounter (Signed)
I called patient, he had a couple of questions about the injection for the hip, what is used, I advised Depo medrol and numbing meds, and he asked how they would do it, I advised with use of ultrasound,  he states that he does have an appt on Monday with him and he will discuss the injection on Monday at the appt

## 2020-03-04 NOTE — Telephone Encounter (Signed)
Patient called in wanting to speak with dr hilts about his mri results . Advised he can contact him through my chart as well

## 2020-03-04 NOTE — Therapy (Signed)
[facility] Physical Therapy [address] [place], [place], [address] Phone: [phone]   Fax:  [phone]  Physical Therapy Treatment  Patient Details  Name: Dennis Harris MRN: [id number] Date of Birth: 05/11/1966 Referring Provider (PT): Dr. Prince Rome   Encounter Date: 03/04/2020   PT End of Session - 03/04/20 0926    Visit Number 2    Number of Visits 12    Date for PT Re-Evaluation 03/31/20    Progress Note Due on Visit 10    PT Start Time 0845    PT Stop Time 0925    PT Time Calculation (min) 40 min    Activity Tolerance Patient tolerated treatment well    Behavior During Therapy [facility] for tasks assessed/performed           Past Medical History:  Diagnosis Date  . 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 Exertional shortness of breath    "at times" (11/14/2012)  . Hypercholesteremia   . Hypertension   . Pericarditis ~ 2009  . Pneumonia   . Rheumatoid arthritis(714.0)     Past Surgical History:  Procedure Laterality Date  . ANTERIOR CERVICAL DECOMP/DISCECTOMY FUSION N/A 11/14/2012   Procedure: ANTERIOR CERVICAL DECOMPRESSION/DISCECTOMY FUSION 1 LEVEL C5-6;  Surgeon: Venita Lick, MD;  Location: [facility] OR;  Service: Orthopedics;  Laterality: N/A;  . APPENDECTOMY  1970's  . BACK SURGERY    . CARDIAC CATHETERIZATION  May 2010   normal  . JOINT REPLACEMENT    . TONSILLECTOMY  1970's?  . 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 TOTAL HIP ARTHROPLASTY Right 12/13/2016   Procedure: RIGHT TOTAL HIP ARTHROPLASTY ANTERIOR APPROACH;  Surgeon: Kathryne Hitch, MD;  Location: [facility] OR;  Service: Orthopedics;  Laterality: Right;    There were no vitals filed for this visit.   Subjective Assessment - 03/04/20 0906    Subjective relays severe hip pain today in Rt hip, he wants to try DN.    Pertinent History Rt THA 2018    Limitations Walking;Lifting;Standing    Diagnostic tests MRI    Patient Stated Goals Reduce pain    Pain Onset  More than a month ago              [facility] Adult PT Treatment/Exercise - 03/04/20 0001      Exercises   Exercises Knee/Hip      Knee/Hip Exercises: Stretches   Hip Flexor Stretch Right;60 seconds;3 reps   supine leg off EOB   Piriformis Stretch Right;3 reps;30 seconds    Piriformis Stretch Limitations figure 4    Other Knee/Hip Stretches hip adductor stretch 30 sec X 3, supine with strap      Knee/Hip Exercises: Aerobic   Nustep 5 min L5, LE only      Knee/Hip Exercises: Machines for Strengthening   Total Gym Leg Press bilat push 75 lbs X 15, then Rt leg only 50 lbs X 15      Knee/Hip Exercises: Standing   SLS 10 sec X 3 reps on Rt      Knee/Hip Exercises: Supine   Hip Adduction Isometric 15 reps    Hip Adduction Isometric Limitations holding 5 sec    Bridges 15 reps    Bridges Limitations holding 5 sec    Straight Leg Raises Limitations attempted but too difficult and painful for him so did supine marches instead 2X10 reps    Other Supine Knee/Hip Exercises clams green X 15  Manual Therapy   Manual Therapy Soft tissue mobilization    Manual therapy comments skilled palpation and monitoring of soft tissue during DN    Soft tissue mobilization ant hip including proximal rectus femoris and iliopsoas, pectineus            Trigger Point Dry Needling - 03/04/20 1013    Consent Given? Yes    Education Handout Provided Previously provided    Muscles Treated Lower Quadrant Rectus femoris    Dry Needling Comments also pectineus with twitch response    Rectus femoris Response Twitch response elicited                     PT Long Term Goals - 02/18/20 1359      PT LONG TERM GOAL #1   Title Patient will demonstrate/report pain at worst less than or equal to 2/10 to facilitate minimal limitation in daily activity secondary to pain symptoms.    Status New    Target Date 03/31/20      PT LONG TERM GOAL #2   Title Patient will demonstrate independent use of  home exercise program to facilitate ability to maintain/progress functional gains from skilled physical therapy services.    Status New    Target Date 03/31/20      PT LONG TERM GOAL #3   Title Patient will demonstrate return to work/recreational activity at previous level of function without limitations secondary due to condition    Status New    Target Date 03/31/20      PT LONG TERM GOAL #4   Title Pt. will demonstrate Rt hip AROM s symptoms to facilitate usual mobility at PLOF.    Status New    Target Date 03/31/20      PT LONG TERM GOAL #5   Title Pt. will demonstrate Rt LE MMT 5/5 throughout to facilitate usual standing, walking, stairs at PLOF s limitation.    Status New    Target Date 03/31/20      Additional Long Term Goals   Additional Long Term Goals Yes      PT LONG TERM GOAL #6   Title Pt. will demonstrate reciprocal ascending/descending stairs at PLOF s limitation for forklift, household navigation.    Status New    Target Date 03/31/20                 Plan - 03/04/20 0940    Clinical Impression Statement Performed DN at this request and this did appear to reduce intensity and tightness at his anterior hip. Then session focused gentle stregthening and stretching for Rt hip as tolerated.    Personal Factors and Comorbidities Other   Rt THA approx. 2 years   Examination-Activity Limitations Sit;Bend;Stairs;Squat;Stand;Transfers;Locomotion Level;Lift;Carry    Examination-Participation Restrictions Community Activity;Other   work   Stability/Clinical Decision Making Stable/Uncomplicated    Rehab Potential Good    PT Frequency --   1-2 x/week   PT Duration 6 weeks    PT Treatment/Interventions ADLs/Self Care Home Management;Cryotherapy;Electrical Stimulation;Iontophoresis 4mg /ml Dexamethasone;Balance training;Moist Heat;Therapeutic activities;Functional mobility training;Ultrasound;Taping;Dry needling;Passive range of motion;Spinal Manipulations;Joint  Manipulations;Patient/family education;Manual techniques;Therapeutic exercise    PT Next Visit Plan how was DN ?    PT Home Exercise Plan    Consulted and Agree with Plan of Care Patient           Patient will benefit from skilled therapeutic intervention in order to improve the following deficits and impairments:  Abnormal gait, Decreased endurance, Hypomobility,  Decreased activity tolerance, Decreased strength, Pain, Difficulty walking, Decreased mobility, Decreased balance, Decreased range of motion, Impaired perceived functional ability, Impaired flexibility, Decreased coordination, Increased muscle spasms  Visit Diagnosis: Pain in right hip  Muscle weakness (generalized)  Difficulty in walking, not elsewhere classified     Problem List Patient Active Problem List   Diagnosis Date Noted  . Hyperlipidemia 11/29/2019  . Tobacco abuse 11/29/2019  . Avascular necrosis of hip, right (HCC) 12/13/2016  . 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 Avascular necrosis of bones of both hips (HCC) 12/07/2016  . Pain of left hip joint 12/07/2016  . Pain of right hip joint 12/07/2016  . SIRS (systemic inflammatory response syndrome) (HCC) 08/07/2014  . Cardiomyopathy- EF 40-45% presumed secondary to acute illness 08/07/2014  . CAP (community acquired pneumonia)   . Right upper quadrant pain   . Acute pericarditis 08/04/2014  . Acute hyponatremia 08/04/2014  . Protein-calorie malnutrition, severe (HCC) 08/04/2014  . Sepsis (HCC) 08/03/2014  . Legionella pneumonia (HCC) 08/03/2014  . Rheumatoid arthritis (HCC) 08/03/2014  . Chest pain 08/03/2014  . Elevated troponin- not fel to be secondary to MI 08/03/2014  . Nausea vomiting and diarrhea 08/03/2014  . Cervical spondylosis without myelopathy 11/06/2012     Ivery Quale, PT, DPT 03/04/20 10:21 AM    Clarita Crane, PT, DPT 03/04/20 10:14 AM    [facility] Physical Therapy [address] [place], [place], [address] Phone: [phone]   Fax:  [phone]  Name: Dennis Harris MRN: [id number] Date of Birth: Oct 12, 1965

## 2020-03-09 ENCOUNTER — Other Ambulatory Visit: Payer: Self-pay

## 2020-03-09 ENCOUNTER — Ambulatory Visit (INDEPENDENT_AMBULATORY_CARE_PROVIDER_SITE_OTHER): Payer: BC Managed Care – PPO | Admitting: Family Medicine

## 2020-03-09 ENCOUNTER — Encounter: Payer: Self-pay | Admitting: Family Medicine

## 2020-03-09 DIAGNOSIS — M25551 Pain in right hip: Secondary | ICD-10-CM

## 2020-03-09 DIAGNOSIS — Z96641 Presence of right artificial hip joint: Secondary | ICD-10-CM | POA: Diagnosis not present

## 2020-03-09 MED ORDER — TRAMADOL HCL 50 MG PO TABS: 50.0000 mg | ORAL_TABLET | Freq: Every evening | ORAL | 0 refills | Status: DC | PRN

## 2020-03-09 NOTE — Progress Notes (Signed)
Office Visit Note   Patient: Dennis Harris           Date of Birth: 1966-01-28           MRN: [id number] Visit Date: 03/09/2020 Requested by: [name], NP [address] [place],  [place] [address] PCP: [name], NP  Subjective: Chief Complaint  Patient presents with  . Right Hip - Pain    In PT. Did some dry needling last week. Continues to have pain in the groin. "I'm tired of the pain."    HPI: He is here for follow-up chronic right hip pain.  Ongoing anterior pain despite doing physical therapy.  He has more sessions scheduled.  He is frustrated by his pain.  He does note that he took some tramadol the other night and it helped him sleep.              ROS:   All other systems were reviewed and are negative.  Objective: Vital Signs: There were no vitals taken for this visit.  Physical Exam:  General:  Alert and oriented, in no acute distress. Pulm:  Breathing unlabored. Psy:  Normal mood, congruent affect.  Right hip: He does have some tenderness to palpation over the anterior hip.  He has pain with hip flexion against resistance.  He has some pain with passive flexion and internal rotation.  No significant tenderness over the greater trochanter.  Imaging: No results found.  Assessment & Plan: 1.  Chronic right hip pain status post replacement -We discussed his recent MRI results in more detail.  There was some evidence of iliopsoas bursitis on the left, but that was not visualized on the right.  There was no sign of loosening of the prosthesis or infection.  He did have an elevated C-reactive protein. -We will continue with physical therapy.  He will follow-up in a month with Dr. Magnus Ivan.  Refilled his medication. -If after he finishes therapy, if Dr. Magnus Ivan feels that he might benefit from iliopsoas injection, I had be happy to do that.     Procedures: No procedures performed  No notes on file     PMFS History: Patient Active  Problem List   Diagnosis Date Noted  . Hyperlipidemia 11/29/2019  . Tobacco abuse 11/29/2019  . Avascular necrosis of hip, right (HCC) 12/13/2016  . 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 Avascular necrosis of bones of both hips (HCC) 12/07/2016  . Pain of left hip joint 12/07/2016  . Pain of right hip joint 12/07/2016  . SIRS (systemic inflammatory response syndrome) (HCC) 08/07/2014  . Cardiomyopathy- EF 40-45% presumed secondary to acute illness 08/07/2014  . CAP (community acquired pneumonia)   . Right upper quadrant pain   . Acute pericarditis 08/04/2014  . Acute hyponatremia 08/04/2014  . Protein-calorie malnutrition, severe (HCC) 08/04/2014  . Sepsis (HCC) 08/03/2014  . Legionella pneumonia (HCC) 08/03/2014  . Rheumatoid arthritis (HCC) 08/03/2014  . Chest pain 08/03/2014  . Elevated troponin- not fel to be secondary to MI 08/03/2014  . Nausea vomiting and diarrhea 08/03/2014  . Cervical spondylosis without myelopathy 11/06/2012   Past Medical History:  Diagnosis Date  . 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 Exertional shortness of breath    "at times" (11/14/2012)  . Hypercholesteremia   . Hypertension   . Pericarditis ~ 2009  . Pneumonia   . Rheumatoid arthritis(714.0)     Family History  Problem  Relation Age of Onset  . Cirrhosis Father   . Lupus Sister   . Lupus Sister   . Hypertension Mother   . Stroke Mother   . Diabetes Mother     Past Surgical History:  Procedure Laterality Date  . ANTERIOR CERVICAL DECOMP/DISCECTOMY FUSION N/A 11/14/2012   Procedure: ANTERIOR CERVICAL DECOMPRESSION/DISCECTOMY FUSION 1 LEVEL C5-6;  Surgeon: Venita Lick, MD;  Location: [facility] OR;  Service: Orthopedics;  Laterality: N/A;  . APPENDECTOMY  1970's  . BACK SURGERY    . CARDIAC CATHETERIZATION  May 2010   normal  . JOINT REPLACEMENT    . TONSILLECTOMY  1970's?  . 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 TOTAL HIP ARTHROPLASTY Right  12/13/2016   Procedure: RIGHT TOTAL HIP ARTHROPLASTY ANTERIOR APPROACH;  Surgeon: Kathryne Hitch, MD;  Location: [facility] OR;  Service: Orthopedics;  Laterality: Right;   Social History   Occupational History  . Not on file  Tobacco Use  . Smoking status: Current Every Day Smoker    Packs/day: 0.50    Years: 37.00    Pack years: 18.50    Types: Cigarettes  . Smokeless tobacco: Never Used  Vaping Use  . Vaping Use: Never used  Substance and Sexual Activity  . Alcohol use: Yes    Alcohol/week: 14.0 standard drinks    Types: 14 Cans of beer per week    Comment: 2-3 12 ounce cans of beer per day  . Drug use: Yes    Types: Marijuana    Comment: 12/13/2016 "nothing in years"  . Sexual activity: Yes

## 2020-03-10 MED FILL — traMADol HCL 50 MG TABS: 50 | 15 days supply | Qty: 30 | Fill #0

## 2020-03-11 ENCOUNTER — Other Ambulatory Visit: Payer: Self-pay

## 2020-03-11 ENCOUNTER — Ambulatory Visit (INDEPENDENT_AMBULATORY_CARE_PROVIDER_SITE_OTHER): Payer: BC Managed Care – PPO | Admitting: Rehabilitative and Restorative Service Providers"

## 2020-03-11 DIAGNOSIS — M6281 Muscle weakness (generalized): Secondary | ICD-10-CM

## 2020-03-11 DIAGNOSIS — R262 Difficulty in walking, not elsewhere classified: Secondary | ICD-10-CM

## 2020-03-11 DIAGNOSIS — M25551 Pain in right hip: Secondary | ICD-10-CM

## 2020-03-11 NOTE — Therapy (Signed)
[facility] Physical Therapy [address] [place], [place], [address] Phone: [phone]   Fax:  [phone]  Physical Therapy Treatment  Patient Details  Name: Dennis Harris MRN: [id number] Date of Birth: 1966/05/09 Referring Provider (PT): Dr. Prince Rome   Encounter Date: 03/11/2020   PT End of Session - 03/11/20 0843    Visit Number 3    Number of Visits 12    Date for PT Re-Evaluation 03/31/20    Progress Note Due on Visit 10    PT Start Time 0844    PT Stop Time 0923    PT Time Calculation (min) 39 min    Activity Tolerance Patient tolerated treatment well    Behavior During Therapy [facility] for tasks assessed/performed           Past Medical History:  Diagnosis Date  . 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 Exertional shortness of breath    "at times" (11/14/2012)  . Hypercholesteremia   . Hypertension   . Pericarditis ~ 2009  . Pneumonia   . Rheumatoid arthritis(714.0)     Past Surgical History:  Procedure Laterality Date  . ANTERIOR CERVICAL DECOMP/DISCECTOMY FUSION N/A 11/14/2012   Procedure: ANTERIOR CERVICAL DECOMPRESSION/DISCECTOMY FUSION 1 LEVEL C5-6;  Surgeon: Venita Lick, MD;  Location: [facility] OR;  Service: Orthopedics;  Laterality: N/A;  . APPENDECTOMY  1970's  . BACK SURGERY    . CARDIAC CATHETERIZATION  May 2010   normal  . JOINT REPLACEMENT    . TONSILLECTOMY  1970's?  . 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 TOTAL HIP ARTHROPLASTY Right 12/13/2016   Procedure: RIGHT TOTAL HIP ARTHROPLASTY ANTERIOR APPROACH;  Surgeon: Kathryne Hitch, MD;  Location: [facility] OR;  Service: Orthopedics;  Laterality: Right;    There were no vitals filed for this visit.   Subjective Assessment - 03/11/20 0853    Subjective Pt. indicated feeling pain continued overall.  Pt. stated he though the last visit may have helped some.    Pertinent History Rt THA 2018    Limitations Walking;Lifting;Standing    Diagnostic tests MRI     Patient Stated Goals Reduce pain    Currently in Pain? Yes    Pain Score 8    at worst   Pain Orientation Anterior    Pain Descriptors / Indicators Aching;Tightness    Pain Onset More than a month ago    Aggravating Factors  prolonged sitting transfers, walking    Pain Relieving Factors movement after standing    Effect of Pain on Daily Activities Work/standing/walking                             [facility] Adult PT Treatment/Exercise - 03/11/20 0001      Knee/Hip Exercises: Stretches   Hip Flexor Stretch Right;5 reps;30 seconds   Thomas positioning     Knee/Hip Exercises: Aerobic   Nustep Lvl 6 10 mins      Knee/Hip Exercises: Machines for Strengthening   Total Gym Leg Press bilateral 100 lbs x 15, SL 62 lbs 2 x10 bilateral      Knee/Hip Exercises: Seated   Other Seated Knee/Hip Exercises slr 3 x 10 bilateral      Knee/Hip Exercises: Supine   Bridges 20 reps      Manual Therapy   Manual therapy comments skilled palpation and monitoring of soft tissue during DN    Soft tissue mobilization Rectus femoris  Trigger Point Dry Needling - 03/11/20 0001    Consent Given? Yes    Education Handout Provided Previously provided    Muscles Treated Lower Quadrant Rectus femoris    Rectus femoris Response Twitch response elicited                     PT Long Term Goals - 03/11/20 0855      PT LONG TERM GOAL #1   Title Patient will demonstrate/report pain at worst less than or equal to 2/10 to facilitate minimal limitation in daily activity secondary to pain symptoms.    Status On-going    Target Date 03/31/20      PT LONG TERM GOAL #2   Title Patient will demonstrate independent use of home exercise program to facilitate ability to maintain/progress functional gains from skilled physical therapy services.    Status On-going    Target Date 03/31/20      PT LONG TERM GOAL #3   Title Patient will demonstrate return to work/recreational  activity at previous level of function without limitations secondary due to condition    Status On-going    Target Date 03/31/20      PT LONG TERM GOAL #4   Title Pt. will demonstrate Rt hip AROM s symptoms to facilitate usual mobility at PLOF.    Status On-going    Target Date 03/31/20      PT LONG TERM GOAL #5   Title Pt. will demonstrate Rt LE MMT 5/5 throughout to facilitate usual standing, walking, stairs at PLOF s limitation.    Status On-going    Target Date 03/31/20      PT LONG TERM GOAL #6   Title Pt. will demonstrate reciprocal ascending/descending stairs at PLOF s limitation for forklift, household navigation.    Status On-going    Target Date 03/31/20                 Plan - 03/11/20 0856    Clinical Impression Statement Pt. indicated feeling concordant symptoms in anterior hip c myofascial palpation of proximal rectus femoris, pectineus today. Rt LE strength deficits evident in Pt. reporting and control noted in clinic.    Personal Factors and Comorbidities Other   Rt THA approx. 2 years   Examination-Activity Limitations Sit;Bend;Stairs;Squat;Stand;Transfers;Locomotion Level;Lift;Carry    Examination-Participation Restrictions Community Activity;Other   work   Stability/Clinical Decision Making Stable/Uncomplicated    Rehab Potential Good    PT Frequency --   1-2 x/week   PT Duration 6 weeks    PT Treatment/Interventions ADLs/Self Care Home Management;Cryotherapy;Electrical Stimulation;Iontophoresis 4mg /ml Dexamethasone;Balance training;Moist Heat;Therapeutic activities;Functional mobility training;Ultrasound;Taping;Dry needling;Passive range of motion;Spinal Manipulations;Joint Manipulations;Patient/family education;Manual techniques;Therapeutic exercise    PT Next Visit Plan DN prn based off results/symptoms, myofascial release techniques.  Improve hip extension on Rt, strength in Rt LE.    PT Home Exercise Plan    Consulted and Agree with Plan of Care  Patient           Patient will benefit from skilled therapeutic intervention in order to improve the following deficits and impairments:  Abnormal gait, Decreased endurance, Hypomobility, Decreased activity tolerance, Decreased strength, Pain, Difficulty walking, Decreased mobility, Decreased balance, Decreased range of motion, Impaired perceived functional ability, Impaired flexibility, Decreased coordination, Increased muscle spasms  Visit Diagnosis: Pain in right hip  Muscle weakness (generalized)  Difficulty in walking, not elsewhere classified     Problem List Patient Active Problem List   Diagnosis Date Noted  . Hyperlipidemia  11/29/2019  . Tobacco abuse 11/29/2019  . Avascular necrosis of hip, right (HCC) 12/13/2016  . 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 Avascular necrosis of bones of both hips (HCC) 12/07/2016  . Pain of left hip joint 12/07/2016  . Pain of right hip joint 12/07/2016  . SIRS (systemic inflammatory response syndrome) (HCC) 08/07/2014  . Cardiomyopathy- EF 40-45% presumed secondary to acute illness 08/07/2014  . CAP (community acquired pneumonia)   . Right upper quadrant pain   . Acute pericarditis 08/04/2014  . Acute hyponatremia 08/04/2014  . Protein-calorie malnutrition, severe (HCC) 08/04/2014  . Sepsis (HCC) 08/03/2014  . Legionella pneumonia (HCC) 08/03/2014  . Rheumatoid arthritis (HCC) 08/03/2014  . Chest pain 08/03/2014  . Elevated troponin- not fel to be secondary to MI 08/03/2014  . Nausea vomiting and diarrhea 08/03/2014  . Cervical spondylosis without myelopathy 11/06/2012    Chyrel Masson, PT, DPT, OCS, ATC 03/11/20  9:18 AM    [facility] Physical Therapy [address] [place], [place], [address] Phone: [phone]   Fax:  [phone]  Name: Dennis Harris MRN: [id number] Date of Birth: Nov 07, 1965

## 2020-03-18 ENCOUNTER — Telehealth: Payer: Self-pay | Admitting: Family Medicine

## 2020-03-18 ENCOUNTER — Ambulatory Visit (INDEPENDENT_AMBULATORY_CARE_PROVIDER_SITE_OTHER): Payer: BC Managed Care – PPO | Admitting: Physical Therapy

## 2020-03-18 ENCOUNTER — Other Ambulatory Visit: Payer: Self-pay

## 2020-03-18 DIAGNOSIS — R262 Difficulty in walking, not elsewhere classified: Secondary | ICD-10-CM

## 2020-03-18 DIAGNOSIS — M25551 Pain in right hip: Secondary | ICD-10-CM

## 2020-03-18 DIAGNOSIS — M6281 Muscle weakness (generalized): Secondary | ICD-10-CM

## 2020-03-18 NOTE — Telephone Encounter (Signed)
I called and left a detailed message on Dennis Harris's voice mail (he stated his full name on the message): the patient will be out of work until at least 04/06/20, when he comes back in to see Dr. Magnus Ivan --- he performed the patient's hip replacement in 2018. Any work update will need to come from Dr. Magnus Ivan.

## 2020-03-18 NOTE — Therapy (Signed)
[facility] Physical Therapy [address] [place], [place], [address] Phone: [phone]   Fax:  [phone]  Physical Therapy Treatment  Patient Details  Name: Dennis Harris MRN: [id number] Date of Birth: 1965/12/06 Referring Provider (PT): Dr. Prince Rome   Encounter Date: 03/18/2020   PT End of Session - 03/18/20 0919    Visit Number 4    Number of Visits 12    Date for PT Re-Evaluation 03/31/20    Progress Note Due on Visit 10    PT Start Time 0845    PT Stop Time 0923    PT Time Calculation (min) 38 min    Activity Tolerance Patient tolerated treatment well    Behavior During Therapy [facility] for tasks assessed/performed           Past Medical History:  Diagnosis Date  . 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 Exertional shortness of breath    "at times" (11/14/2012)  . Hypercholesteremia   . Hypertension   . Pericarditis ~ 2009  . Pneumonia   . Rheumatoid arthritis(714.0)     Past Surgical History:  Procedure Laterality Date  . ANTERIOR CERVICAL DECOMP/DISCECTOMY FUSION N/A 11/14/2012   Procedure: ANTERIOR CERVICAL DECOMPRESSION/DISCECTOMY FUSION 1 LEVEL C5-6;  Surgeon: Venita Lick, MD;  Location: [facility] OR;  Service: Orthopedics;  Laterality: N/A;  . APPENDECTOMY  1970's  . BACK SURGERY    . CARDIAC CATHETERIZATION  May 2010   normal  . JOINT REPLACEMENT    . TONSILLECTOMY  1970's?  . 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 TOTAL HIP ARTHROPLASTY Right 12/13/2016   Procedure: RIGHT TOTAL HIP ARTHROPLASTY ANTERIOR APPROACH;  Surgeon: Kathryne Hitch, MD;  Location: [facility] OR;  Service: Orthopedics;  Laterality: Right;    There were no vitals filed for this visit.   Subjective Assessment - 03/18/20 0903    Subjective Pt. indicated feeling a litte better but pain is still there about 6/10 overall    Pertinent History Rt THA 2018    Limitations Walking;Lifting;Standing    Diagnostic tests MRI    Patient Stated Goals Reduce  pain    Pain Onset More than a month ago             [facility] Adult PT Treatment/Exercise - 03/18/20 0001      Knee/Hip Exercises: Stretches   Hip Flexor Stretch Right;4 reps;30 seconds    Hip Flexor Stretch Limitations supine with strap leg off EOB      Knee/Hip Exercises: Aerobic   Recumbent Bike L3 X 7 min      Knee/Hip Exercises: Machines for Strengthening   Cybex Knee Extension bilat 15 lbs 2X15    Cybex Knee Flexion bilat 25 lbs 2X15    Total Gym Leg Press bilateral 106 lbs x 20, SL 68 lbs 2 x15 bilateral      Knee/Hip Exercises: Standing   Other Standing Knee Exercises lateral walking green band up/down counter X 5 reps      Knee/Hip Exercises: Seated   Sit to Sand without UE support;20 reps      Knee/Hip Exercises: Supine   Bridges 20 reps;Right    Straight Leg Raises Right;20 reps      Knee/Hip Exercises: Sidelying   Hip ABduction 20 reps                       PT Long Term Goals - 03/11/20 0855      PT LONG  TERM GOAL #1   Title Patient will demonstrate/report pain at worst less than or equal to 2/10 to facilitate minimal limitation in daily activity secondary to pain symptoms.    Status On-going    Target Date 03/31/20      PT LONG TERM GOAL #2   Title Patient will demonstrate independent use of home exercise program to facilitate ability to maintain/progress functional gains from skilled physical therapy services.    Status On-going    Target Date 03/31/20      PT LONG TERM GOAL #3   Title Patient will demonstrate return to work/recreational activity at previous level of function without limitations secondary due to condition    Status On-going    Target Date 03/31/20      PT LONG TERM GOAL #4   Title Pt. will demonstrate Rt hip AROM s symptoms to facilitate usual mobility at PLOF.    Status On-going    Target Date 03/31/20      PT LONG TERM GOAL #5   Title Pt. will demonstrate Rt LE MMT 5/5 throughout to facilitate usual standing,  walking, stairs at PLOF s limitation.    Status On-going    Target Date 03/31/20      PT LONG TERM GOAL #6   Title Pt. will demonstrate reciprocal ascending/descending stairs at PLOF s limitation for forklift, household navigation.    Status On-going    Target Date 03/31/20                 Plan - 03/18/20 7989    Clinical Impression Statement He has made good progress with activity tolerance since I last saw him. Continued with stretching and strengthening for Rt hip/knee and he was able to progress strengthening today with good tolerance. PT will continue to progress as tolerated.    Personal Factors and Comorbidities Other   Rt THA approx. 2 years   Examination-Activity Limitations Sit;Bend;Stairs;Squat;Stand;Transfers;Locomotion Level;Lift;Carry    Examination-Participation Restrictions Community Activity;Other   work   Stability/Clinical Decision Making Stable/Uncomplicated    Rehab Potential Good    PT Frequency --   1-2 x/week   PT Duration 6 weeks    PT Treatment/Interventions ADLs/Self Care Home Management;Cryotherapy;Electrical Stimulation;Iontophoresis 4mg /ml Dexamethasone;Balance training;Moist Heat;Therapeutic activities;Functional mobility training;Ultrasound;Taping;Dry needling;Passive range of motion;Spinal Manipulations;Joint Manipulations;Patient/family education;Manual techniques;Therapeutic exercise    PT Next Visit Plan DN prn based off results/symptoms, myofascial release techniques.  Improve hip extension on Rt, strength in Rt LE.    PT Home Exercise Plan    Consulted and Agree with Plan of Care Patient           Patient will benefit from skilled therapeutic intervention in order to improve the following deficits and impairments:  Abnormal gait, Decreased endurance, Hypomobility, Decreased activity tolerance, Decreased strength, Pain, Difficulty walking, Decreased mobility, Decreased balance, Decreased range of motion, Impaired perceived functional  ability, Impaired flexibility, Decreased coordination, Increased muscle spasms  Visit Diagnosis: Pain in right hip  Muscle weakness (generalized)  Difficulty in walking, not elsewhere classified     Problem List Patient Active Problem List   Diagnosis Date Noted  . Hyperlipidemia 11/29/2019  . Tobacco abuse 11/29/2019  . Avascular necrosis of hip, right (HCC) 12/13/2016  . 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 Avascular necrosis of bones of both hips (HCC) 12/07/2016  . Pain of left hip joint 12/07/2016  . Pain of right hip joint 12/07/2016  . SIRS (systemic inflammatory response syndrome) (HCC) 08/07/2014  . Cardiomyopathy-  EF 40-45% presumed secondary to acute illness 08/07/2014  . CAP (community acquired pneumonia)   . Right upper quadrant pain   . Acute pericarditis 08/04/2014  . Acute hyponatremia 08/04/2014  . Protein-calorie malnutrition, severe (HCC) 08/04/2014  . Sepsis (HCC) 08/03/2014  . Legionella pneumonia (HCC) 08/03/2014  . Rheumatoid arthritis (HCC) 08/03/2014  . Chest pain 08/03/2014  . Elevated troponin- not fel to be secondary to MI 08/03/2014  . Nausea vomiting and diarrhea 08/03/2014  . Cervical spondylosis without myelopathy 11/06/2012    Dennis Harris 03/18/2020, 9:24 AM  [facility] Physical Therapy [address] [place], [place], [address] Phone: [phone]   Fax:  [phone]  Name: DAVIDANTHONY MASINO MRN: [id number] Date of Birth: 1966/02/22

## 2020-03-18 NOTE — Telephone Encounter (Signed)
Brandon with [employer] called wanting to know how much longer the pt will be out of work? Apolinar Junes would like a CB or the answer written up and faxed over.   Apolinar Junes CB# [phone] Fax# [phone]

## 2020-03-25 ENCOUNTER — Encounter: Payer: BC Managed Care – PPO | Admitting: Rehabilitative and Restorative Service Providers"

## 2020-03-27 ENCOUNTER — Encounter: Payer: BC Managed Care – PPO | Admitting: Rehabilitative and Restorative Service Providers"

## 2020-03-31 ENCOUNTER — Other Ambulatory Visit (HOSPITAL_COMMUNITY): Payer: BC Managed Care – PPO

## 2020-04-01 ENCOUNTER — Encounter: Payer: Self-pay | Admitting: Rehabilitative and Restorative Service Providers"

## 2020-04-01 ENCOUNTER — Other Ambulatory Visit: Payer: Self-pay

## 2020-04-01 ENCOUNTER — Ambulatory Visit (INDEPENDENT_AMBULATORY_CARE_PROVIDER_SITE_OTHER): Payer: BC Managed Care – PPO | Admitting: Rehabilitative and Restorative Service Providers"

## 2020-04-01 DIAGNOSIS — R262 Difficulty in walking, not elsewhere classified: Secondary | ICD-10-CM | POA: Diagnosis not present

## 2020-04-01 DIAGNOSIS — M25551 Pain in right hip: Secondary | ICD-10-CM | POA: Diagnosis not present

## 2020-04-01 DIAGNOSIS — M6281 Muscle weakness (generalized): Secondary | ICD-10-CM | POA: Diagnosis not present

## 2020-04-01 NOTE — Therapy (Addendum)
[facility] [place], [place], [address] Phone: [phone]   Fax:  [phone]  Physical Therapy Treatment/Discharge  Patient Details  Name: Dennis Harris MRN: [id number] Date of Birth: 05/11/1966 Referring Provider (PT): Dr. Junius Roads   Encounter Date: 04/01/2020   PT End of Session - 04/01/20 0841    Visit Number 5    Number of Visits 12    Date for PT Re-Evaluation 03/31/20    Progress Note Due on Visit 10    PT Start Time 0843    PT Stop Time 0923    PT Time Calculation (min) 40 min    Activity Tolerance Patient tolerated treatment well    Behavior During Therapy [facility] for tasks assessed/performed           Past Medical History:  Diagnosis Date  . Avascular necrosis of bone of hip ([place])    right  . Exertional shortness of breath    "at times" (11/14/2012)  . Hypercholesteremia   . Hypertension   . Pericarditis ~ 2009  . Pneumonia   . Rheumatoid arthritis(714.0)     Past Surgical History:  Procedure Laterality Date  . ANTERIOR CERVICAL DECOMP/DISCECTOMY FUSION N/A 11/14/2012   Procedure: ANTERIOR CERVICAL DECOMPRESSION/DISCECTOMY FUSION 1 LEVEL C5-6;  Surgeon: Melina Schools, MD;  Location: [place];  Service: Orthopedics;  Laterality: N/A;  . APPENDECTOMY  1970's  . BACK SURGERY    . CARDIAC CATHETERIZATION  May 2010   normal  . JOINT REPLACEMENT    . TONSILLECTOMY  1970's?  . 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 TOTAL HIP ARTHROPLASTY Right 12/13/2016   Procedure: RIGHT TOTAL HIP ARTHROPLASTY ANTERIOR APPROACH;  Surgeon: Mcarthur Rossetti, MD;  Location: [place];  Service: Orthopedics;  Laterality: Right;    There were no vitals filed for this visit.   Subjective Assessment - 04/01/20 0845    Subjective Pt. indicated no pain today.  Pt. stated overall moving around better with leg.  Pt. stated some arthritis complaints in Lt wirst.    Pertinent History Rt THA 2018    Limitations  Walking;Lifting;Standing    Diagnostic tests MRI    Patient Stated Goals Reduce pain    Currently in Pain? No/denies    Pain Score 0-No pain    Pain Onset More than a month ago                             [facility] Adult PT Treatment/Exercise - 04/01/20 0001      Neuro Re-ed    Neuro Re-ed Details  tandem amb on foam in // bars 8 ft x 6 fwd/back, tandem stance on foam c occasional HHA 2 mins bilateral,       Knee/Hip Exercises: Aerobic   Recumbent Bike Lvl 4 10 mins      Knee/Hip Exercises: Machines for Strengthening   Total Gym Leg Press SL 75 lbs 3 x 10, performed bilateral      Knee/Hip Exercises: Standing   Other Standing Knee Exercises standing doorway SL c hip abd contralateral 5 sec x 15 bilateral      Knee/Hip Exercises: Seated   Sit to Sand without UE support   10 lbs 2 x 10                      PT Long Term Goals - 03/11/20 0855      PT LONG TERM GOAL #1  Title Patient will demonstrate/report pain at worst less than or equal to 2/10 to facilitate minimal limitation in daily activity secondary to pain symptoms.    Status On-going    Target Date 03/31/20      PT LONG TERM GOAL #2   Title Patient will demonstrate independent use of home exercise program to facilitate ability to maintain/progress functional gains from skilled physical therapy services.    Status On-going    Target Date 03/31/20      PT LONG TERM GOAL #3   Title Patient will demonstrate return to work/recreational activity at previous level of function without limitations secondary due to condition    Status On-going    Target Date 03/31/20      PT LONG TERM GOAL #4   Title Pt. will demonstrate Rt hip AROM s symptoms to facilitate usual mobility at PLOF.    Status On-going    Target Date 03/31/20      PT LONG TERM GOAL #5   Title Pt. will demonstrate Rt LE MMT 5/5 throughout to facilitate usual standing, walking, stairs at PLOF s limitation.    Status On-going     Target Date 03/31/20      PT LONG TERM GOAL #6   Title Pt. will demonstrate reciprocal ascending/descending stairs at PLOF s limitation for forklift, household navigation.    Status On-going    Target Date 03/31/20                 Plan - 04/01/20 0911    Clinical Impression Statement Pt. to benefit from skilled PT services to improve hip strength and movement coordination on compliant surfaces to improve stability in uneven ground ambulation.  Making continued gains in symptoms reduction.    Personal Factors and Comorbidities Other   Rt THA approx. 2 years   Examination-Activity Limitations Sit;Bend;Stairs;Squat;Stand;Transfers;Locomotion Level;Lift;Carry    Examination-Participation Restrictions Community Activity;Other   work   Stability/Clinical Decision Making Stable/Uncomplicated    Rehab Potential Good    PT Frequency --   1-2 x/week   PT Duration 6 weeks    PT Treatment/Interventions ADLs/Self Care Home Management;Cryotherapy;Electrical Stimulation;Iontophoresis 65m/ml Dexamethasone;Balance training;Moist Heat;Therapeutic activities;Functional mobility training;Ultrasound;Taping;Dry needling;Passive range of motion;Spinal Manipulations;Joint Manipulations;Patient/family education;Manual techniques;Therapeutic exercise    PT Next Visit Plan Hip strengthening, balance compliant surface.    PT Home Exercise Plan [id number]   Consulted and Agree with Plan of Care Patient           Patient will benefit from skilled therapeutic intervention in order to improve the following deficits and impairments:  Abnormal gait, Decreased endurance, Hypomobility, Decreased activity tolerance, Decreased strength, Pain, Difficulty walking, Decreased mobility, Decreased balance, Decreased range of motion, Impaired perceived functional ability, Impaired flexibility, Decreased coordination, Increased muscle spasms  Visit Diagnosis: Pain in right hip  Muscle weakness (generalized)  Difficulty in  walking, not elsewhere classified     Problem List Patient Active Problem List   Diagnosis Date Noted  . Hyperlipidemia 11/29/2019  . Tobacco abuse 11/29/2019  . Avascular necrosis of hip, right ([place] 12/13/2016  . 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 Avascular necrosis of bones of both hips ([place] 12/07/2016  . Pain of left hip joint 12/07/2016  . Pain of right hip joint 12/07/2016  . SIRS (systemic inflammatory response syndrome) ([place] 08/07/2014  . Cardiomyopathy- EF 40-45% presumed secondary to acute illness 08/07/2014  . CAP (community acquired pneumonia)   . Right upper quadrant pain   . Acute pericarditis  08/04/2014  . Acute hyponatremia 08/04/2014  . Protein-calorie malnutrition, severe ([place]) 08/04/2014  . Sepsis ([place]) 08/03/2014  . Legionella pneumonia ([place]) 08/03/2014  . Rheumatoid arthritis ([place]) 08/03/2014  . Chest pain 08/03/2014  . Elevated troponin- not fel to be secondary to MI 08/03/2014  . Nausea vomiting and diarrhea 08/03/2014  . Cervical spondylosis without myelopathy 11/06/2012   Scot Jun, PT, DPT, OCS, ATC 04/01/20  9:15 AM  PHYSICAL THERAPY DISCHARGE SUMMARY  Visits from Start of Care: 5  Current functional level related to goals / functional outcomes: See note   Remaining deficits: See note   Education / Equipment: HEP Plan: Patient agrees to discharge.  Patient goals were partially met. Patient is being discharged due to not returning since the last visit.  ?????    Scot Jun, PT, DPT, OCS, ATC 05/12/20  2:22 PM       [facility] [address] [place], [place], [address] Phone: [phone]   Fax:  [phone]  Name: Dennis Harris MRN: [id number] Date of Birth: Dec 11, 1965

## 2020-04-06 ENCOUNTER — Ambulatory Visit (INDEPENDENT_AMBULATORY_CARE_PROVIDER_SITE_OTHER): Payer: BC Managed Care – PPO | Admitting: Orthopaedic Surgery

## 2020-04-06 ENCOUNTER — Encounter: Payer: Self-pay | Admitting: Orthopaedic Surgery

## 2020-04-06 DIAGNOSIS — M25551 Pain in right hip: Secondary | ICD-10-CM

## 2020-04-06 DIAGNOSIS — Z96641 Presence of right artificial hip joint: Secondary | ICD-10-CM

## 2020-04-06 NOTE — Progress Notes (Signed)
The patient reports that his right hip pain is feeling much better overall and is close to subsiding.  He has a history of a right total hip arthroplasty that we did in 2018.  He said no evidence of mechanical loosening on plain films or the MRI.  These were basically normal.  He feels that physical therapy has helped him greatly.  They have even tried dry needling.  His right hip moves smoothly and fluidly with no pain at all.  He is would not walk with any type of limp and there is no instability on my exam of his right hip.  This point he can follow-up as needed.  He did see Dr. Prince Rome as well.  We both feel that if his pain returns, we may set him up for an intra-articular steroid injection around the right hip release the iliopsoas tendon area.  Otherwise, since he is doing well.  Follow-up is as needed.  All questions and concerns were answered and addressed.

## 2020-04-13 ENCOUNTER — Other Ambulatory Visit (HOSPITAL_COMMUNITY): Payer: BC Managed Care – PPO

## 2020-04-13 ENCOUNTER — Encounter (HOSPITAL_COMMUNITY): Payer: Self-pay | Admitting: Cardiovascular Disease

## 2020-04-14 ENCOUNTER — Ambulatory Visit: Payer: BC Managed Care – PPO | Admitting: Cardiovascular Disease

## 2020-04-20 ENCOUNTER — Telehealth: Payer: Self-pay

## 2020-04-20 NOTE — Telephone Encounter (Signed)
Patient called he needs a work note stating he can be out of work for another month he wants to come pick it up tomorrow. Call [phone]

## 2020-04-21 NOTE — Telephone Encounter (Signed)
That will certainly be fine to give him a note like that.  Thanks

## 2020-04-21 NOTE — Telephone Encounter (Signed)
Ok for this? 

## 2020-04-21 NOTE — Telephone Encounter (Signed)
Note completed. IC s/w patient and he will pick up at front desk this afternoon.

## 2020-04-23 ENCOUNTER — Telehealth: Payer: Self-pay | Admitting: Orthopaedic Surgery

## 2020-04-23 ENCOUNTER — Telehealth (HOSPITAL_COMMUNITY): Payer: Self-pay | Admitting: Cardiovascular Disease

## 2020-04-23 NOTE — Telephone Encounter (Signed)
Just an FYI. We have made several attempts to contact this patient including sending a letter to schedule or reschedule their echocardiogram. We will be removing the patient from the echo WQ.   04/13/20 MAILED Letter to reschedule No Show/LBW  04/13/20 PT NO SHOWED ECHO   Thank you

## 2020-04-23 NOTE — Telephone Encounter (Signed)
Received call from patient requesting copy of 9/13 ov note. Would like to pickup tomorrow. I will have ready. Advised need to sign release form when he comes to pickup.

## 2020-09-29 ENCOUNTER — Other Ambulatory Visit: Payer: Self-pay | Admitting: Family Medicine

## 2020-09-29 MED FILL — predniSONE 10 MG TABS: 10 | 6 days supply | Qty: 21 | Fill #0

## 2020-09-29 MED FILL — AMLODIPINE BESYLATE 5 MG TA: 5 | 30 days supply | Qty: 30 | Fill #0

## 2020-09-29 MED FILL — CHLORTHALIDONE 25 MG TAB: 25 | 30 days supply | Qty: 30 | Fill #0

## 2020-09-29 MED FILL — LOSARTAN POTASSIUM 25 MG TA: 25 | 15 days supply | Qty: 30 | Fill #0

## 2020-11-30 ENCOUNTER — Other Ambulatory Visit: Payer: Self-pay

## 2020-11-30 MED FILL — Amlodipine Besylate Tab 5 MG (Base Equivalent): ORAL | 30 days supply | Qty: 30 | Fill #0 | Status: CN

## 2020-11-30 MED FILL — Chlorthalidone Tab 25 MG: ORAL | 30 days supply | Qty: 30 | Fill #0 | Status: CN

## 2020-11-30 MED FILL — Losartan Potassium Tab 25 MG: ORAL | 15 days supply | Qty: 30 | Fill #0 | Status: CN

## 2020-12-01 ENCOUNTER — Other Ambulatory Visit: Payer: Self-pay

## 2020-12-07 ENCOUNTER — Other Ambulatory Visit: Payer: Self-pay

## 2021-04-12 ENCOUNTER — Other Ambulatory Visit: Payer: Self-pay

## 2021-04-12 MED ORDER — LOSARTAN POTASSIUM 25 MG PO TABS
ORAL_TABLET | ORAL | 3 refills | Status: DC
  Filled 2021-04-12: qty 60, 30d supply, fill #0

## 2021-04-12 MED ORDER — CHLORTHALIDONE 25 MG PO TABS
ORAL_TABLET | ORAL | 3 refills | Status: DC
  Filled 2021-04-12: qty 30, 30d supply, fill #0

## 2021-04-12 MED ORDER — PREDNISONE 10 MG PO TABS
ORAL_TABLET | ORAL | 3 refills | Status: DC
  Filled 2021-04-12: qty 21, 6d supply, fill #0

## 2021-04-12 MED ORDER — AMLODIPINE BESYLATE 5 MG PO TABS
ORAL_TABLET | ORAL | 3 refills | Status: DC
  Filled 2021-04-12 – 2021-11-08 (×3): qty 30, 30d supply, fill #0
  Filled 2022-01-21: qty 30, 30d supply, fill #1

## 2021-04-14 ENCOUNTER — Other Ambulatory Visit: Payer: Self-pay

## 2021-05-07 IMAGING — MR MR HIP*R* W/O CM
6 series · 40 of 40 positions shown · non-contrast
Comparison: None.

CLINICAL DATA: Right hip replacement in 0229. Right groin pain for
3 months.

EXAM:
MR OF THE RIGHT HIP WITHOUT CONTRAST
TECHNIQUE: Multiplanar, multisequence MR imaging was performed. No intravenous
contrast was administered.

[Series 4: T1 · axial · 4.5mm · 1.56mm/px · z∈[-97,+235]mm · 10 of 60 slices shown (1 of 2)]
[im 1/60]
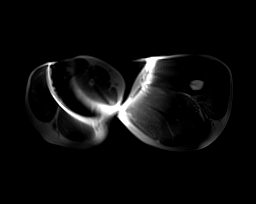
[im 7/60]
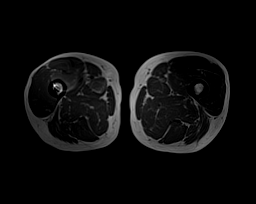
[im 14/60]
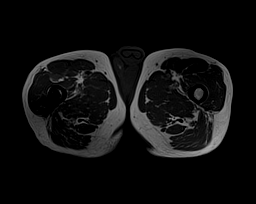
[im 20/60]
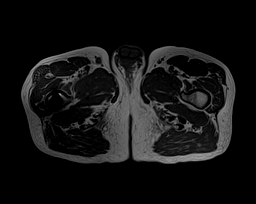
[im 27/60]
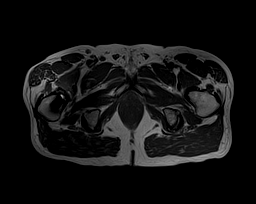
[im 33/60]
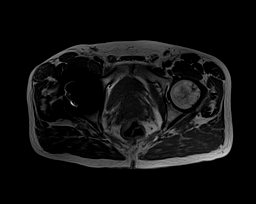
[im 40/60]
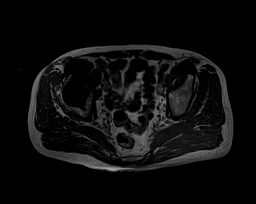
[im 46/60]
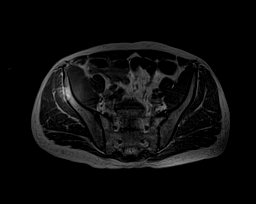
[im 53/60]
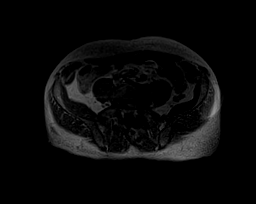
[im 60/60]
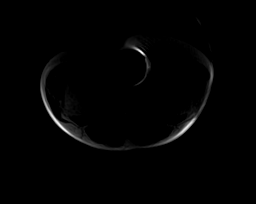

[Series 5: T2 · axial · 4.5mm · 0.78mm/px · z∈[-97,+235]mm · 9 of 60 slices shown (1 of 2)]
[im 1/60]
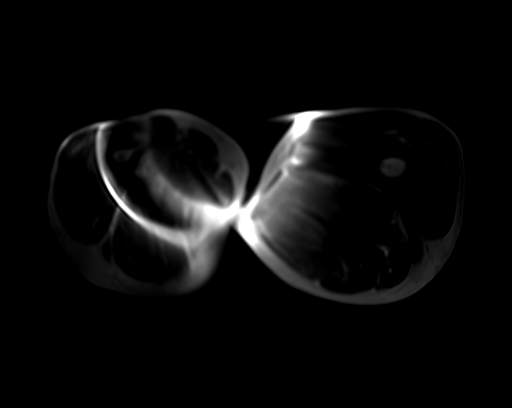
[im 8/60]
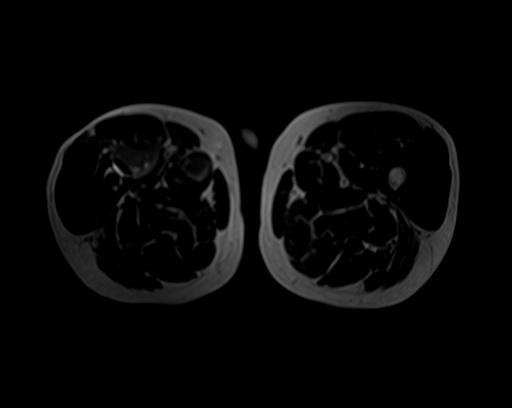
[im 15/60]
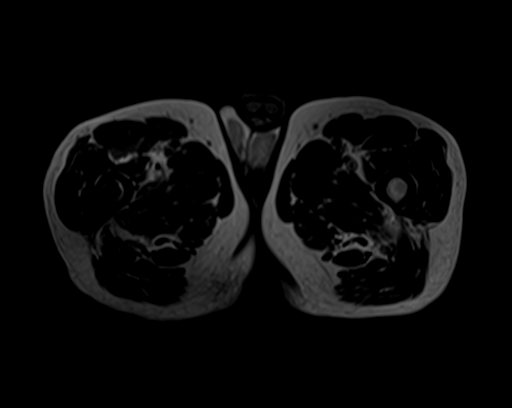
[im 23/60]
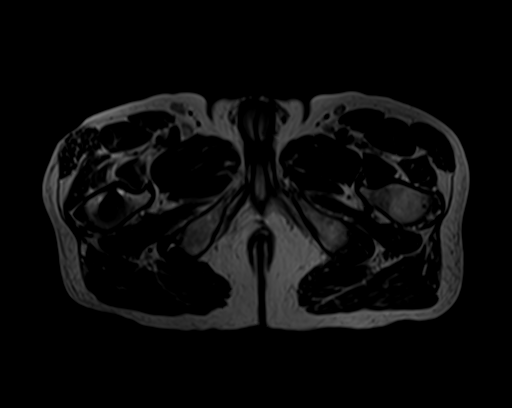
[im 30/60]
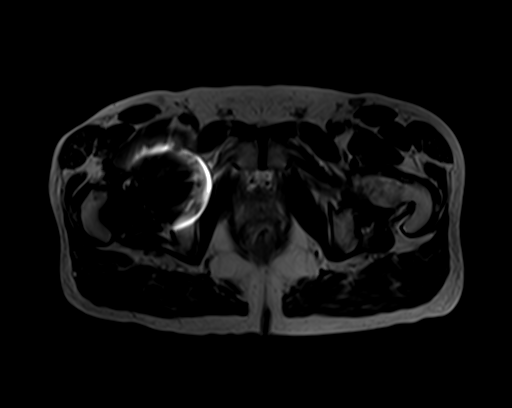
[im 37/60]
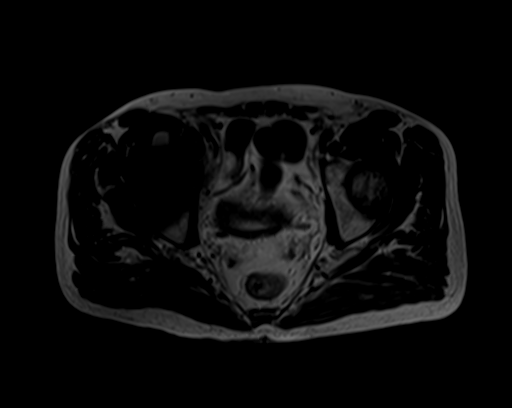
[im 45/60]
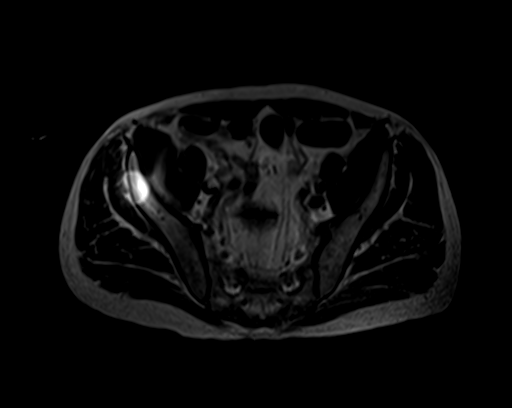
[im 52/60]
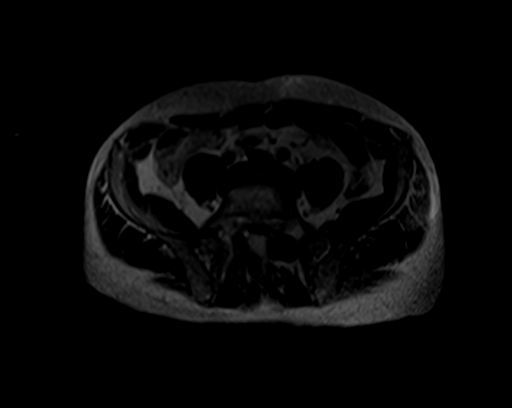
[im 60/60]
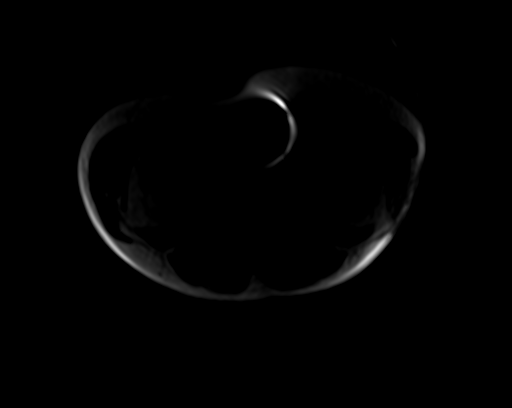

[Series 6: T1 · coronal · 4.0mm · 1.25mm/px · 4 of 27 slices shown (2 of 2)]
[im 1/27]
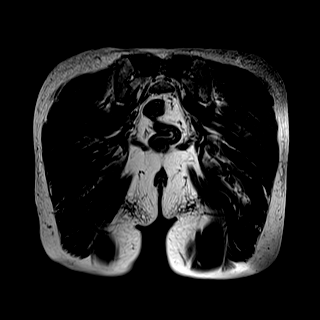
[im 9/27]
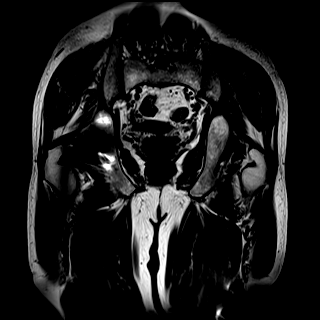
[im 18/27]
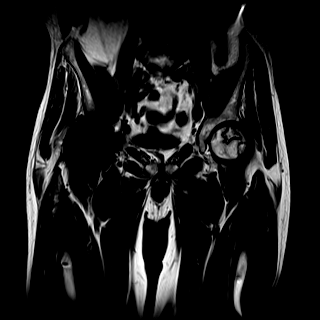
[im 27/27]
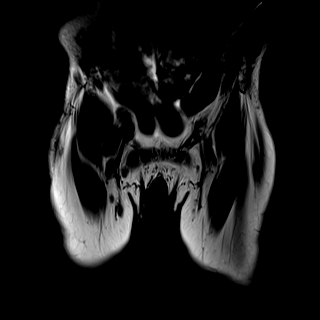

[Series 7: STIR · axial · 4.5mm · 1.56mm/px · z∈[-97,+235]mm · 9 of 60 slices shown (1 of 2)]
[im 1/60]
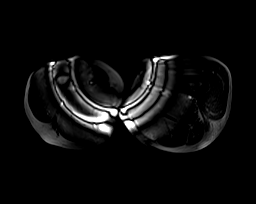
[im 8/60]
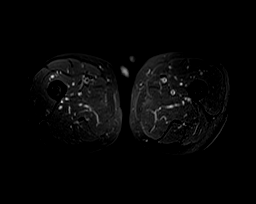
[im 15/60]
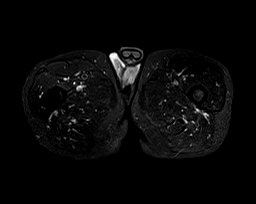
[im 23/60]
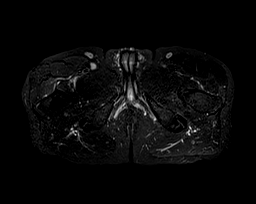
[im 30/60]
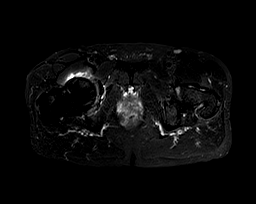
[im 37/60]
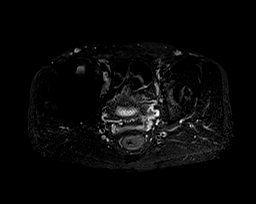
[im 45/60]
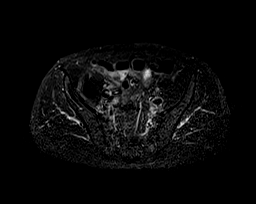
[im 52/60]
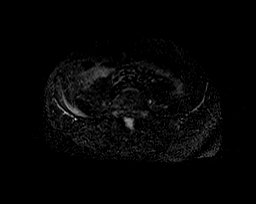
[im 60/60]
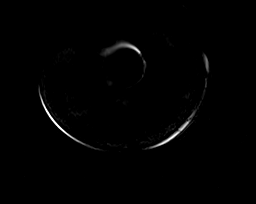

[Series 8: STIR · coronal · 4.0mm · 0.78mm/px · 4 of 27 slices shown (2 of 2)]
[im 1/27]
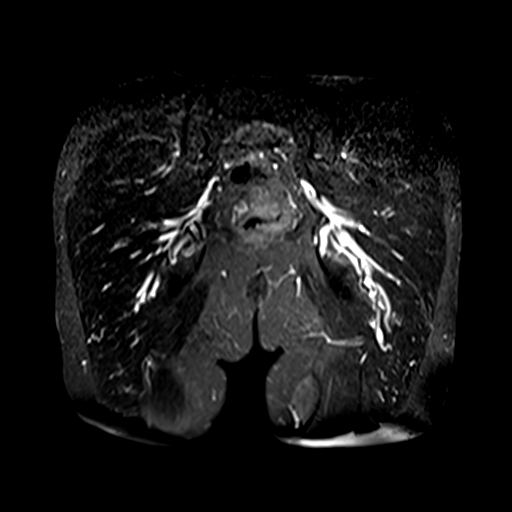
[im 9/27]
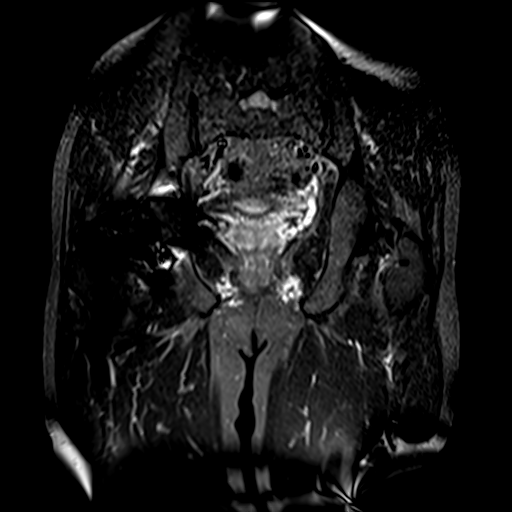
[im 18/27]
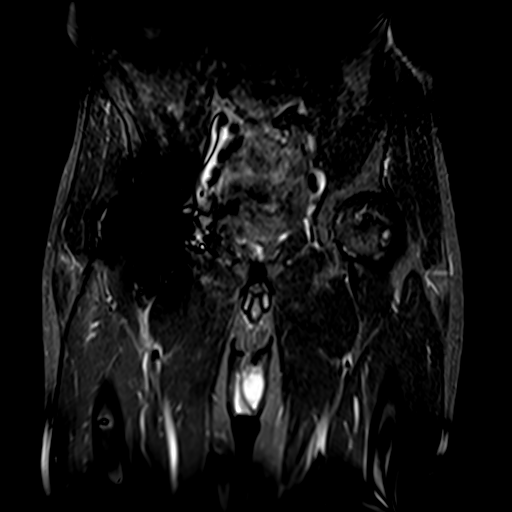
[im 27/27]
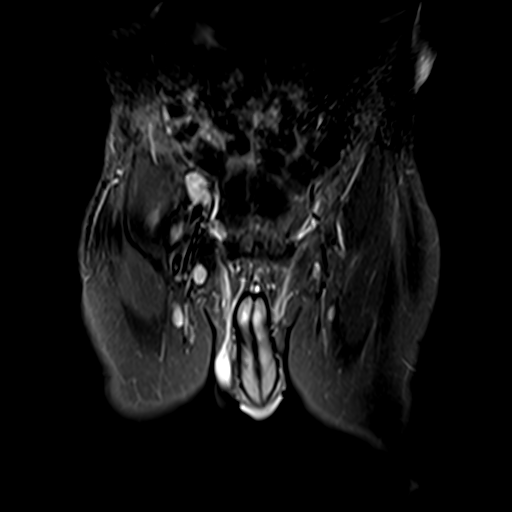

[Series 9: T2 · sagittal · 4.0mm · 0.98mm/px · 4 of 27 slices shown (2 of 2)]
[im 1/27]
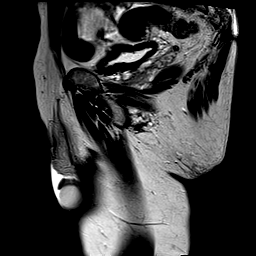
[im 9/27]
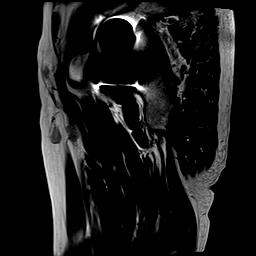
[im 18/27]
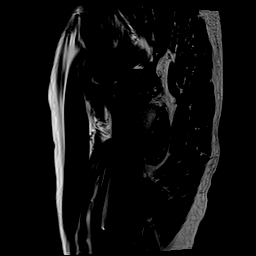
[im 27/27]
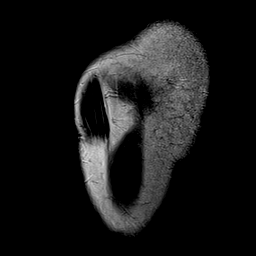

[40 of 40 positions shown; findings below may reference images not displayed]

FINDINGS: Bones:

Right total hip arthroplasty with susceptibility artifact obscuring
the adjacent soft tissue and osseous structures.

No hip fracture or dislocation. No osteolysis. No aggressive osseous
lesion. Serpiginous low signal abnormality in the left superior
femoral head without surrounding marrow edema most consistent with
chronic avascular necrosis. No articular surface collapse.

Normal sacrum and sacroiliac joints. No SI joint widening or erosive
changes. Lower lumbar spine spondylosis.

Articular cartilage and labrum

Articular cartilage:  No chondral defect.

Labrum: Grossly intact, but evaluation is limited by lack of
intraarticular fluid.

Joint or bursal effusion

Joint effusion:  No hip joint effusion.  No SI joint effusion.

Bursae: Small amount of fluid in the left iliopsoas bursa as can be
seen with iliopsoas bursitis.

Muscles and tendons

Flexors: Normal.

Extensors: Normal.

Abductors: Normal.

Adductors: Normal.

Gluteals: Normal.

Hamstrings: Mild tendinosis of the left hamstring origin.

Other findings

Miscellaneous: No pelvic free fluid. No fluid collection or
hematoma. No inguinal lymphadenopathy. No inguinal hernia.
IMPRESSION: 1. Right total hip arthroplasty with susceptibility artifact
obscuring the adjacent soft tissue and osseous structures. No hip
fracture or dislocation. No osteolysis.
2. Mild right iliopsoas bursitis.
3. Chronic avascular necrosis of the left femoral head without
articular surface collapse.
4. Mild tendinosis of the left hamstring origin.

## 2021-06-23 ENCOUNTER — Other Ambulatory Visit: Payer: Self-pay

## 2021-06-23 MED ORDER — AMLODIPINE BESYLATE 5 MG PO TABS
ORAL_TABLET | ORAL | 3 refills | Status: DC
  Filled 2021-06-23: qty 30, 30d supply, fill #0

## 2021-06-23 MED ORDER — CHLORTHALIDONE 25 MG PO TABS
ORAL_TABLET | ORAL | 3 refills | Status: DC
  Filled 2021-06-23 – 2021-11-08 (×3): qty 30, 30d supply, fill #0
  Filled 2022-01-21: qty 30, 30d supply, fill #1

## 2021-06-23 MED ORDER — LOSARTAN POTASSIUM 25 MG PO TABS
ORAL_TABLET | ORAL | 3 refills | Status: DC
  Filled 2021-06-23: qty 60, 30d supply, fill #0
  Filled 2021-09-15: qty 30, 15d supply, fill #0
  Filled 2021-11-08: qty 60, 30d supply, fill #0
  Filled 2021-11-08: qty 30, 15d supply, fill #0
  Filled 2022-01-21: qty 30, 15d supply, fill #1

## 2021-06-23 MED ORDER — PREDNISONE 10 MG PO TABS
ORAL_TABLET | ORAL | 0 refills | Status: DC
Start: 2021-06-23 — End: 2021-09-14
  Filled 2021-06-23: qty 30, 30d supply, fill #0

## 2021-06-24 ENCOUNTER — Other Ambulatory Visit: Payer: Self-pay

## 2021-09-14 ENCOUNTER — Emergency Department (HOSPITAL_COMMUNITY)
Admission: EM | Admit: 2021-09-14 | Discharge: 2021-09-14 | Disposition: A | Discharge status: Home / Self Care | Payer: Managed Care, Other (non HMO) | Attending: Emergency Medicine | Admitting: Emergency Medicine

## 2021-09-14 ENCOUNTER — Other Ambulatory Visit: Payer: Self-pay

## 2021-09-14 ENCOUNTER — Encounter (HOSPITAL_COMMUNITY): Payer: Self-pay | Admitting: Emergency Medicine

## 2021-09-14 DIAGNOSIS — M2550 Pain in unspecified joint: Secondary | ICD-10-CM | POA: Diagnosis not present

## 2021-09-14 MED ORDER — KETOROLAC TROMETHAMINE 60 MG/2ML IM SOLN
60.0000 mg | Freq: Once | INTRAMUSCULAR | Status: AC
  Administered 2021-09-14: 60 mg via INTRAMUSCULAR
  Filled 2021-09-14: qty 2

## 2021-09-14 MED ORDER — KETOROLAC TROMETHAMINE 30 MG/ML IJ SOLN
30.0000 mg | Freq: Once | INTRAMUSCULAR | Status: DC
  Filled 2021-09-14: qty 1

## 2021-09-14 MED ORDER — PREDNISONE 20 MG PO TABS
60.0000 mg | ORAL_TABLET | Freq: Every day | ORAL | 0 refills | Status: AC
  Filled 2021-09-14: qty 18, 6d supply, fill #0

## 2021-09-14 MED ORDER — PREDNISONE 20 MG PO TABS
60.0000 mg | ORAL_TABLET | Freq: Once | ORAL | Status: AC
  Administered 2021-09-14: 60 mg via ORAL
  Filled 2021-09-14: qty 3

## 2021-09-14 MED ORDER — OXYCODONE HCL 5 MG PO TABS
5.0000 mg | ORAL_TABLET | Freq: Once | ORAL | Status: AC
  Administered 2021-09-14: 5 mg via ORAL
  Filled 2021-09-14: qty 1

## 2021-09-14 NOTE — Discharge Instructions (Signed)
Follow-up with your primary care doctor if you are able to.  I have put in the referral to rheumatology.  They should call you for follow-up appointment to get you back on some medications for your rheumatoid arthritis.  I have prescribed you steroids to take for the next several days.  Continue Tylenol and ibuprofen for pain as well.

## 2021-09-14 NOTE — ED Triage Notes (Signed)
Patient with history of rheumatoid arthritis complains of pain in his fingers, hands, wrist, shoulders, knees, and feet. Patient states he was taking methotrexate but due to changes in insurance patient has not had any medication in approximately six months. Patient alert, oriented, and in no apparent distress at this time.

## 2021-09-14 NOTE — ED Provider Notes (Signed)
[facility] EMERGENCY DEPARTMENT Provider Note   CSN: [id number] Arrival date & time: 09/14/21  0732     History  Chief Complaint  Patient presents with   Joint Pain    Dennis Harris is a 56 y.o. male.  The history is provided by the patient.  Illness Location:  Hands Severity:  Mild Onset quality:  Gradual Duration:  3 days Timing:  Constant Progression:  Worsening Relieved by:  Nothing Worsened by:  Nothing Associated symptoms: no abdominal pain, no chest pain, no congestion, no cough, no diarrhea, no ear pain, no fatigue, no fever, no headaches, no loss of consciousness, no myalgias, no nausea, no rash, no rhinorrhea, no shortness of breath, no sore throat, no vomiting and no wheezing       Home Medications Prior to Admission medications   Medication Sig Start Date End Date Taking? Authorizing Provider  predniSONE (DELTASONE) 20 MG tablet Take 3 tablets (60 mg total) by mouth daily for 6 days. 09/14/21 09/20/21 Yes Ragan Reale, DO  amLODipine (NORVASC) 5 MG tablet Take 1 tablet (5 mg total) by mouth daily. 12/31/19   Runell Gess, MD  amLODipine (NORVASC) 5 MG tablet take 1 tablet (5 mg) by mouth once daily 04/12/21     amLODipine (NORVASC) 5 MG tablet Take 1 tablet (5 mg) by mouth once daily 06/23/21     amLODipine (NORVASC) 5 MG tablet TAKE 1 TABLET (5 MG) BY ORAL ROUTE ONCE DAILY 09/29/20 09/29/21  Franki Cabot, FNP  aspirin 81 MG chewable tablet Chew 1 tablet (81 mg total) by mouth 2 (two) times daily. Patient taking differently: Chew 81 mg by mouth daily.  06/22/17   Loletta Specter, PA-C  atorvastatin (LIPITOR) 40 MG tablet Take 1 tablet (40 mg total) by mouth daily. 12/31/19 03/30/20  Runell Gess, MD  carvedilol (COREG) 3.125 MG tablet Take 1 tablet (3.125 mg total) by mouth 2 (two) times daily. 12/31/19 03/30/20  Runell Gess, MD  chlorthalidone (HYGROTON) 25 MG tablet Take 1 tablet (25 mg total) by mouth daily. 12/31/19   Runell Gess, MD  chlorthalidone (HYGROTON) 25 MG tablet Take 1 tablet (25 mg) by mouth once daily 04/12/21     chlorthalidone (HYGROTON) 25 MG tablet TAKE 1 TABLET (25 MG) BY ORAL ROUTE ONCE DAILY 09/29/20 09/29/21  Franki Cabot, FNP  chlorthalidone (HYGROTON) 25 MG tablet Take 1 tablet (25 mg) by mouth once daily 06/23/21     Cholecalciferol (VITAMIN D-3) 125 MCG (5000 UT) TABS Take 1 tablet by mouth daily. 01/13/20   Hilts, Casimiro Needle, MD  ferrous sulfate 325 (65 FE) MG tablet Take 1 tablet (325 mg total) by mouth daily with breakfast. 11/20/19   [name], NP  folic acid (FOLVITE) 1 MG tablet Take 1 tablet (1 mg total) by mouth daily. 06/20/18   Loletta Specter, PA-C  HYDROCORTISONE EX Apply 1 application topically 2 (two) times daily as needed (for razor bumps/rash/itching.).    [provider]  losartan (COZAAR) 100 MG tablet Take 1 tablet (100 mg total) by mouth daily. 11/07/19   [name], NP  losartan (COZAAR) 25 MG tablet Take 1 tablet (25 mg) by mouth 2 times per day 04/12/21     losartan (COZAAR) 25 MG tablet Take 1 tablet (25 mg) by mouth 2 times per day 06/23/21     losartan (COZAAR) 25 MG tablet TAKE 1 TABLET (25 MG) BY ORAL ROUTE 2 TIMES PER  DAY 09/29/20 09/29/21  Franki Cabot, FNP  methotrexate (RHEUMATREX) 2.5 MG tablet Take 4 tablets (10 mg total) by mouth once a week. Caution:Chemotherapy. Protect from light. 07/27/18   Loletta Specter, PA-C  nabumetone (RELAFEN) 750 MG tablet Take 1 tablet (750 mg total) by mouth 2 (two) times daily as needed. 01/13/20   Hilts, Casimiro Needle, MD  nitroGLYCERIN (NITROSTAT) 0.4 MG SL tablet Place 1 tablet (0.4 mg total) under the tongue every 5 (five) minutes as needed for chest pain. 11/20/19   [name], NP  Omega-3 Fatty Acids (FISH OIL) 1000 MG CAPS Take 1,000 mg by mouth daily.    [provider]  tiZANidine (ZANAFLEX) 2 MG tablet Take 1-2 tablets (2-4 mg total) by mouth every 6 (six) hours as needed for  muscle spasms. 01/13/20   Hilts, Casimiro Needle, MD  traMADol (ULTRAM) 50 MG tablet Take 1-2 tablets (50-100 mg total) by mouth at bedtime as needed. 03/09/20   Hilts, Casimiro Needle, MD      Allergies    Bee venom and Shellfish allergy    Review of Systems   Review of Systems  Constitutional:  Negative for fatigue and fever.  HENT:  Negative for congestion, ear pain, rhinorrhea and sore throat.   Respiratory:  Negative for cough, shortness of breath and wheezing.   Cardiovascular:  Negative for chest pain.  Gastrointestinal:  Negative for abdominal pain, diarrhea, nausea and vomiting.  Musculoskeletal:  Negative for myalgias.  Skin:  Negative for rash.  Neurological:  Negative for loss of consciousness and headaches.   Physical Exam Updated Vital Signs  ED Triage Vitals  Enc Vitals Group     BP 09/14/21 0737 (!) 167/102     Pulse Rate 09/14/21 0737 (!) 111     Resp 09/14/21 0737 14     Temp 09/14/21 0737 98 F (36.7 C)     Temp Source 09/14/21 0737 Oral     SpO2 09/14/21 0737 99 %     Weight 09/14/21 0758 165 lb (74.8 kg)     Height 09/14/21 0758 5\' 10"  (1.778 m)     Head Circumference --      Peak Flow --      Pain Score 09/14/21 0737 10     Pain Loc --      Pain Edu? --      Excl. in GC? --     Physical Exam Vitals and nursing note reviewed.  Constitutional:      General: He is not in acute distress.    Appearance: He is well-developed. He is not ill-appearing.  HENT:     Head: Normocephalic and atraumatic.     Nose: Nose normal.     Mouth/Throat:     Mouth: Mucous membranes are moist.  Eyes:     Extraocular Movements: Extraocular movements intact.     Conjunctiva/sclera: Conjunctivae normal.     Pupils: Pupils are equal, round, and reactive to light.  Cardiovascular:     Rate and Rhythm: Normal rate and regular rhythm.     Pulses: Normal pulses.     Heart sounds: Normal heart sounds. No murmur heard. Pulmonary:     Effort: Pulmonary effort is normal. No respiratory  distress.     Breath sounds: Normal breath sounds.  Abdominal:     Palpations: Abdomen is soft.     Tenderness: There is no abdominal tenderness.  Musculoskeletal:        General: Tenderness present. No swelling.  Cervical back: Normal range of motion and neck supple.     Comments: Some tenderness to bilateral fingers, wrist  Skin:    General: Skin is warm and dry.     Capillary Refill: Capillary refill takes less than 2 seconds.  Neurological:     General: No focal deficit present.     Mental Status: He is alert and oriented to person, place, and time.     Cranial Nerves: No cranial nerve deficit.     Sensory: No sensory deficit.     Motor: No weakness.     Coordination: Coordination normal.     Comments: 5+ out of 5 strength, normal sensation,  Psychiatric:        Mood and Affect: Mood normal.    ED Results / Procedures / Treatments   Labs (all labs ordered are listed, but only abnormal results are displayed) Labs Reviewed - No data to display  EKG None  Radiology No results found.  Procedures Procedures    Medications Ordered in ED Medications  oxyCODONE (Oxy IR/ROXICODONE) immediate release tablet 5 mg (5 mg Oral Given 09/14/21 0756)  predniSONE (DELTASONE) tablet 60 mg (60 mg Oral Given 09/14/21 0756)    ED Course/ Medical Decision Making/ A&P                           Medical Decision Making Risk Prescription drug management.   [name] is here with joint pain.  Unremarkable vitals.  No fever.  History of rheumatoid arthritis.  Has not been taking his methotrexate due to insurance issues.  Joints starting to flareup on him here over the last several days.  Mostly pain in the hands and wrists but also some in the shoulders and knees.  He is got tenderness in mostly the hands and wrist on exam.  This is bilateral.  I have no concern for septic joint or other acute emergent process.  Overall suspect that this is a rheumatoid arthritis flare.  We  will put him on steroids.  Patient given dose of prednisone and narcotic pain medicine here in ED.  Recommend continued use of Tylenol and ibuprofen.  Ambulatory referral placed to rheumatologist.  Discharged in good condition.  Patient neurovascular neuromuscularly intact.  This chart was dictated using voice recognition software.  Despite best efforts to proofread,  errors can occur which can change the documentation meaning.         Final Clinical Impression(s) / ED Diagnoses Final diagnoses:  Polyarthralgia    Rx / DC Orders ED Discharge Orders          Ordered    Ambulatory referral to Rheumatology        09/14/21 0814    predniSONE (DELTASONE) 20 MG tablet  Daily        09/14/21 0815              Virgina Norfolk, DO 09/14/21 [phone]

## 2021-09-15 ENCOUNTER — Other Ambulatory Visit: Payer: Self-pay

## 2021-09-21 ENCOUNTER — Other Ambulatory Visit: Payer: Self-pay

## 2021-11-06 ENCOUNTER — Other Ambulatory Visit: Payer: Self-pay

## 2021-11-06 ENCOUNTER — Emergency Department (HOSPITAL_COMMUNITY): Admission: EM | Admit: 2021-11-06 | Payer: Managed Care, Other (non HMO) | Source: Home / Self Care

## 2021-11-07 ENCOUNTER — Ambulatory Visit
Admission: EM | Admit: 2021-11-07 | Discharge: 2021-11-07 | Disposition: A | Discharge status: Home / Self Care | Payer: Managed Care, Other (non HMO) | Attending: Physician Assistant | Admitting: Physician Assistant

## 2021-11-07 DIAGNOSIS — I10 Essential (primary) hypertension: Secondary | ICD-10-CM

## 2021-11-07 DIAGNOSIS — M2550 Pain in unspecified joint: Secondary | ICD-10-CM

## 2021-11-07 MED ORDER — HYDROCODONE-ACETAMINOPHEN 5-325 MG PO TABS: 1.0000 | ORAL_TABLET | Freq: Three times a day (TID) | ORAL | 0 refills | Status: DC | PRN

## 2021-11-07 MED ORDER — PREDNISONE 10 MG (21) PO TBPK: ORAL_TABLET | ORAL | 0 refills | Status: DC

## 2021-11-07 NOTE — ED Provider Notes (Addendum)
[facility]    CSN: [id number] Arrival date & time: 11/07/21  0810      History   Chief Complaint Chief Complaint  Patient presents with   Joint Pain   Joint Swelling    HPI Dennis Harris is a 56 y.o. male.   Patient here concerned with joint pain of multiple joints.  He reports history of RA, he has an appointment with rheumatologist in 2 weeks.  He reports he has joint pain of multiple small and large joints, admits swelling, RROM, stiffness, pain.  Pain is severe.  He went to ED last night and this morning but left due to wait times.  He did not take his blood pressure medications this morning.  He denies HA, vision changes, chest pain, SOB, LE edema, n/t, n/v.     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   Pain of left hip joint 12/07/2016   Pain of right hip joint 12/07/2016   SIRS (systemic inflammatory response syndrome) (HCC) 08/07/2014   Cardiomyopathy- EF 40-45% presumed secondary to acute illness 08/07/2014   CAP (community acquired pneumonia)    Right upper quadrant pain    Acute pericarditis 08/04/2014   Acute hyponatremia 08/04/2014   Protein-calorie malnutrition, severe (HCC) 08/04/2014   Sepsis (HCC) 08/03/2014   Legionella pneumonia (HCC) 08/03/2014   Rheumatoid arthritis (HCC) 08/03/2014   Chest pain 08/03/2014   Elevated troponin- not fel to be secondary to MI 08/03/2014   Nausea vomiting and diarrhea 08/03/2014   Cervical spondylosis without  myelopathy 11/06/2012    Past Surgical History:  Procedure Laterality Date   ANTERIOR CERVICAL DECOMP/DISCECTOMY FUSION N/A 11/14/2012   Procedure: ANTERIOR CERVICAL DECOMPRESSION/DISCECTOMY FUSION 1 LEVEL C5-6;  Surgeon: Venita Lick, MD;  Location: [facility] OR;  Service: Orthopedics;  Laterality: N/A;   APPENDECTOMY  1970's   BACK SURGERY     CARDIAC CATHETERIZATION  May 2010   normal   JOINT REPLACEMENT     TONSILLECTOMY  1970's?   TOTAL HIP ARTHROPLASTY Right 12/13/2016   TOTAL HIP ARTHROPLASTY Right 12/13/2016   Procedure: RIGHT TOTAL HIP ARTHROPLASTY ANTERIOR APPROACH;  Surgeon: Kathryne Hitch, MD;  Location: [facility] OR;  Service: Orthopedics;  Laterality: Right;       Home Medications    Prior to Admission medications   Medication Sig Start Date End Date Taking? Authorizing Provider  HYDROcodone-acetaminophen (NORCO/VICODIN) 5-325 MG tablet Take 1 tablet by mouth every 8 (eight) hours as needed. 11/07/21  Yes Evern Core, PA-C  predniSONE (STERAPRED UNI-PAK 21 TAB) 10 MG (21) TBPK tablet Take as directed on packaging 11/07/21  Yes Evern Core, PA-C  amLODipine (NORVASC) 5 MG tablet Take 1 tablet (5 mg total) by mouth daily. 12/31/19   Runell Gess, MD  amLODipine (NORVASC) 5 MG tablet take 1 tablet (5 mg) by mouth once daily 04/12/21     amLODipine (NORVASC) 5 MG tablet Take 1 tablet (5 mg)  by mouth once daily 06/23/21     amLODipine (NORVASC) 5 MG tablet TAKE 1 TABLET (5 MG) BY ORAL ROUTE ONCE DAILY 09/29/20 09/29/21  Franki Cabot, FNP  aspirin 81 MG chewable tablet Chew 1 tablet (81 mg total) by mouth 2 (two) times daily. Patient taking differently: Chew 81 mg by mouth daily.  06/22/17   Loletta Specter, PA-C  atorvastatin (LIPITOR) 40 MG tablet Take 1 tablet (40 mg total) by mouth daily. 12/31/19 03/30/20  Runell Gess, MD  carvedilol (COREG) 3.125 MG tablet Take 1 tablet (3.125 mg total) by mouth 2 (two) times daily. 12/31/19 03/30/20  Runell Gess, MD   chlorthalidone (HYGROTON) 25 MG tablet Take 1 tablet (25 mg total) by mouth daily. 12/31/19   Runell Gess, MD  chlorthalidone (HYGROTON) 25 MG tablet Take 1 tablet (25 mg) by mouth once daily 04/12/21     chlorthalidone (HYGROTON) 25 MG tablet TAKE 1 TABLET (25 MG) BY ORAL ROUTE ONCE DAILY 09/29/20 09/29/21  Franki Cabot, FNP  chlorthalidone (HYGROTON) 25 MG tablet Take 1 tablet (25 mg) by mouth once daily 06/23/21     Cholecalciferol (VITAMIN D-3) 125 MCG (5000 UT) TABS Take 1 tablet by mouth daily. 01/13/20   Hilts, Casimiro Needle, MD  ferrous sulfate 325 (65 FE) MG tablet Take 1 tablet (325 mg total) by mouth daily with breakfast. 11/20/19   [name], NP  folic acid (FOLVITE) 1 MG tablet Take 1 tablet (1 mg total) by mouth daily. 06/20/18   Loletta Specter, PA-C  HYDROCORTISONE EX Apply 1 application topically 2 (two) times daily as needed (for razor bumps/rash/itching.).    [provider]  losartan (COZAAR) 100 MG tablet Take 1 tablet (100 mg total) by mouth daily. 11/07/19   [name], NP  losartan (COZAAR) 25 MG tablet Take 1 tablet (25 mg) by mouth 2 times per day 04/12/21     losartan (COZAAR) 25 MG tablet Take 1 tablet (25 mg) by mouth 2 times per day 06/23/21     losartan (COZAAR) 25 MG tablet TAKE 1 TABLET (25 MG) BY ORAL ROUTE 2 TIMES PER DAY 09/29/20 09/29/21  Franki Cabot, FNP  methotrexate (RHEUMATREX) 2.5 MG tablet Take 4 tablets (10 mg total) by mouth once a week. Caution:Chemotherapy. Protect from light. 07/27/18   Loletta Specter, PA-C  nabumetone (RELAFEN) 750 MG tablet Take 1 tablet (750 mg total) by mouth 2 (two) times daily as needed. 01/13/20   Hilts, Casimiro Needle, MD  nitroGLYCERIN (NITROSTAT) 0.4 MG SL tablet Place 1 tablet (0.4 mg total) under the tongue every 5 (five) minutes as needed for chest pain. 11/20/19   [name], NP  Omega-3 Fatty Acids (FISH OIL) 1000 MG CAPS Take 1,000 mg by mouth daily.    [provider]   tiZANidine (ZANAFLEX) 2 MG tablet Take 1-2 tablets (2-4 mg total) by mouth every 6 (six) hours as needed for muscle spasms. 01/13/20   Hilts, Casimiro Needle, MD  traMADol (ULTRAM) 50 MG tablet Take 1-2 tablets (50-100 mg total) by mouth at bedtime as needed. 03/09/20   Hilts, Casimiro Needle, MD    Family History Family History  Problem Relation Age of Onset   Cirrhosis Father    Lupus Sister    Lupus Sister    Hypertension Mother    Stroke Mother    Diabetes Mother     Social History Social History   Tobacco Use   Smoking status: Every Day  Packs/day: 0.50    Years: 37.00    Pack years: 18.50    Types: Cigarettes   Smokeless tobacco: Never  Vaping Use   Vaping Use: Never used  Substance Use Topics   Alcohol use: Yes    Alcohol/week: 14.0 standard drinks    Types: 14 Cans of beer per week    Comment: 2-3 12 ounce cans of beer per day   Drug use: Yes    Types: Marijuana    Comment: 12/13/2016 "nothing in years"     Allergies   Bee venom and Shellfish allergy   Review of Systems Review of Systems  Constitutional:  Negative for chills, fatigue and fever.  HENT:  Negative for congestion, ear pain, nosebleeds, postnasal drip, rhinorrhea, sinus pressure, sinus pain and sore throat.   Eyes:  Negative for photophobia, pain, redness and visual disturbance.  Respiratory:  Negative for cough, shortness of breath and wheezing.   Cardiovascular:  Negative for chest pain, palpitations and leg swelling.  Gastrointestinal:  Negative for abdominal pain, diarrhea, nausea and vomiting.  Musculoskeletal:  Positive for arthralgias, gait problem and joint swelling. Negative for myalgias.  Skin:  Negative for color change and rash.  Neurological:  Negative for dizziness, syncope, speech difficulty, weakness, light-headedness, numbness and headaches.  Hematological:  Negative for adenopathy. Does not bruise/bleed easily.  Psychiatric/Behavioral:  Positive for sleep disturbance. Negative for  confusion.     Physical Exam Triage Vital Signs ED Triage Vitals  Enc Vitals Group     BP 11/07/21 0829 (S) (!) 211/113     Pulse Rate 11/07/21 0829 91     Resp 11/07/21 0829 18     Temp 11/07/21 0829 97.9 F (36.6 C)     Temp Source 11/07/21 0829 Oral     SpO2 11/07/21 0829 98 %     Weight --      Height --      Head Circumference --      Peak Flow --      Pain Score 11/07/21 0830 10     Pain Loc --      Pain Edu? --      Excl. in GC? --    No data found.  Updated Vital Signs BP (S) (!) 211/113 (BP Location: Left Arm) Comment: did not take med this morning  Pulse 91   Temp 97.9 F (36.6 C) (Oral)   Resp 18   SpO2 98%   Visual Acuity Right Eye Distance:   Left Eye Distance:   Bilateral Distance:    Right Eye Near:   Left Eye Near:    Bilateral Near:     Physical Exam Vitals and nursing note reviewed.  Constitutional:      General: He is not in acute distress.    Appearance: Normal appearance. He is not ill-appearing.  HENT:     Head: Normocephalic and atraumatic.     Right Ear: Tympanic membrane and ear canal normal.     Left Ear: Tympanic membrane and ear canal normal.     Nose: No congestion or rhinorrhea.     Mouth/Throat:     Pharynx: No oropharyngeal exudate or posterior oropharyngeal erythema.  Eyes:     General: No scleral icterus.    Extraocular Movements: Extraocular movements intact.     Conjunctiva/sclera: Conjunctivae normal.  Cardiovascular:     Rate and Rhythm: Normal rate and regular rhythm.     Heart sounds: No murmur heard. Pulmonary:     Effort:  Pulmonary effort is normal. No respiratory distress.     Breath sounds: Normal breath sounds. No wheezing or rales.  Musculoskeletal:     Cervical back: Normal range of motion. No rigidity.     Comments: Swelling, pain, tenderness along b/l ankles, knees, elbows, wrists and multiple finger joints.  RROM, grip strength 3/5  Lymphadenopathy:     Cervical: No cervical adenopathy.  Skin:     Capillary Refill: Capillary refill takes less than 2 seconds.     Coloration: Skin is not jaundiced.     Findings: No rash.  Neurological:     General: No focal deficit present.     Mental Status: He is alert and oriented to person, place, and time.     GCS: GCS eye subscore is 4. GCS verbal subscore is 5. GCS motor subscore is 6.     Cranial Nerves: Cranial nerves 2-12 are intact.     Motor: No weakness.     Gait: Gait normal.  Psychiatric:        Mood and Affect: Mood normal.        Behavior: Behavior normal.     UC Treatments / Results  Labs (all labs ordered are listed, but only abnormal results are displayed) Labs Reviewed - No data to display  EKG   Radiology No results found.  Procedures Procedures (including critical care time)  Medications Ordered in UC Medications - No data to display  Initial Impression / Assessment and Plan / UC Course  I have reviewed the triage vital signs and the nursing notes.  Pertinent labs & imaging results that were available during my care of the patient were reviewed by me and considered in my medical decision making (see chart for details).     Go to ED if you have no improvement or with worsening symptoms Take medication as prescribed Red flag symptoms discussed regarding his blood pressure - take blood pressure medications as prescribed Discussed side effects of prednisone include fluid retention and elevated blood pressure, however, he would like to start it to ease his joint point.  He reports he'll monitor his blood pressure and go to ED with worsening symptoms Follow up with your rheumatologist  ER note reviewed from 2/23.  Final Clinical Impressions(s) / UC Diagnoses   Final diagnoses:  Polyarthralgia  Hypertension, unspecified type     Discharge Instructions      Follow up with rheumatologist Monitor blood pressure carefully Steroids will cause blood pressure to rise - take your blood pressure medication Go  to ED if no improvement or any worsening symptoms   ED Prescriptions     Medication Sig Dispense Auth. Provider   predniSONE (STERAPRED UNI-PAK 21 TAB) 10 MG (21) TBPK tablet Take as directed on packaging 1 each Evern Core, PA-C   HYDROcodone-acetaminophen (NORCO/VICODIN) 5-325 MG tablet Take 1 tablet by mouth every 8 (eight) hours as needed. 3 tablet Evern Core, PA-C      I have reviewed the PDMP during this encounter.   Evern Core, PA-C 11/07/21 0908    Evern Core, PA-C 11/07/21 [phone]

## 2021-11-07 NOTE — Discharge Instructions (Addendum)
Follow up with rheumatologist ?Monitor blood pressure carefully ?Steroids will cause blood pressure to rise - take your blood pressure medication ?Go to ED if no improvement or any worsening symptoms ?

## 2021-11-07 NOTE — ED Triage Notes (Signed)
Patient presents to Urgent Care with complaints of generalized joint pain and swelling since Thursday. Has a hx of RA. Has an appt with rheumatologist May 1st. Treating symptoms with ibuprofen, tumeric, and osteo bi flex.  ? ?Denies fever.  ?

## 2021-11-08 ENCOUNTER — Other Ambulatory Visit: Payer: Self-pay

## 2021-11-09 ENCOUNTER — Other Ambulatory Visit: Payer: Self-pay

## 2021-11-21 NOTE — Progress Notes (Signed)
? ?Office Visit Note ? ?Patient: Dennis Harris             ?Date of Birth: [birth year]           ?MRN: [id number]             ?PCP: Pcp, No ?Referring: Virgina Norfolk, DO ?Visit Date: 11/22/2021 ? ? ?Subjective:  ?New Patient (Initial Visit) (RA flares) ? ? ?History of Present Illness: Dennis Harris is a 56 y.o. male here for joint pains he has history of RA treated with methotrexate and recent ED visits due to flare ups treated with prednisone.  He was diagnosed sometime around 2007 to 2009 with rheumatoid arthritis due to joint inflammation in multiple areas.  He was on oral methotrexate for years for treatment which had been effective.  Also treated episodically with steroids for active inflammation which was generally beneficial and no major side effects.  Most recent other rheumatology provider with Dr. Nickola Major but has not seen her for more than a year and due to loss of insurance when he lost his previous employment.  As result he has been off any maintenance treatment throughout last year.  He also has a history of degenerative arthritis and AVN and had anterior cervical spine fusion and right hip replacement. ?He developed skin rashes on his chest years ago saw dermatology a decade or more ago was not thought to represent any significant ongoing process.  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 ? ?Labs reviewed ?05/2018 ?ANA neg ?RF 168 ?CCP >250 ? ?Activities of Daily Living:  ?Patient reports morning stiffness for 1 hour.   ?Patient Denies nocturnal pain.  ?Difficulty dressing/grooming: Reports ?Difficulty climbing stairs: Denies ?Difficulty getting out of chair: Denies ?Difficulty using hands for taps, buttons, cutlery, and/or writing: Reports ? ?Review of Systems  ?Constitutional:  Negative for fatigue.  ?HENT:  Negative for mouth dryness.   ?Eyes:  Negative for dryness.  ?Respiratory:  Negative for shortness of breath.   ?Cardiovascular:   Positive for swelling in legs/feet.  ?Gastrointestinal:  Negative for constipation.  ?Endocrine: Negative for excessive thirst.  ?Genitourinary:  Negative for difficulty urinating.  ?Musculoskeletal:  Positive for joint pain, joint pain, joint swelling, morning stiffness and muscle tenderness.  ?Skin:  Positive for rash.  ?Allergic/Immunologic: Negative for susceptible to infections.  ?Neurological:  Negative for numbness.  ?Hematological:  Negative for bruising/bleeding tendency.  ?Psychiatric/Behavioral:  Positive for sleep disturbance.   ? ?PMFS History:  ?Patient Active Problem List  ? Diagnosis Date Noted  ? High risk medication use 11/22/2021  ? 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 Hyperlipidemia 11/29/2019  ? Tobacco abuse 11/29/2019  ? Avascular necrosis of hip, right (HCC) 12/13/2016  ? 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 Avascular necrosis of bones of both hips (HCC) 12/07/2016  ? Pain of left hip joint 12/07/2016  ? Pain of right hip joint 12/07/2016  ? SIRS (systemic inflammatory response syndrome) (HCC) 08/07/2014  ? Cardiomyopathy- EF 40-45% presumed secondary to acute illness 08/07/2014  ? CAP (community acquired pneumonia)   ? Right upper quadrant pain   ? Acute pericarditis 08/04/2014  ? Acute hyponatremia 08/04/2014  ? Protein-calorie malnutrition, severe (HCC) 08/04/2014  ? Sepsis (HCC) 08/03/2014  ? Legionella pneumonia (HCC) 08/03/2014  ? Rheumatoid arthritis (HCC) 08/03/2014  ? Chest pain 08/03/2014  ? Elevated troponin- not fel to be secondary to MI 08/03/2014  ? Nausea vomiting and diarrhea  08/03/2014  ? Cervical spondylosis without myelopathy 11/06/2012  ?  ?Past Medical History:  ?Diagnosis Date  ? Avascular necrosis of bone of hip (HCC)   ? right  ? Exertional shortness of breath   ? "at times" (11/14/2012)  ? Hypercholesteremia   ? Hypertension   ? Pericarditis ~ 2009  ? Pneumonia   ? Rheumatoid arthritis(714.0)   ?  ?Family History  ?Problem Relation  Age of Onset  ? Cancer Mother   ? Hypertension Mother   ? Stroke Mother   ? Diabetes Mother   ? Cirrhosis Father   ? Lupus Sister   ? Lupus Sister   ? Breast cancer Sister   ? ?Past Surgical History:  ?Procedure Laterality Date  ? ANTERIOR CERVICAL DECOMP/DISCECTOMY FUSION N/A 11/14/2012  ? Procedure: ANTERIOR CERVICAL DECOMPRESSION/DISCECTOMY FUSION 1 LEVEL C5-6;  Surgeon: Venita Lick, MD;  Location: [facility] OR;  Service: Orthopedics;  Laterality: N/A;  ? APPENDECTOMY  1970's  ? BACK SURGERY    ? CARDIAC CATHETERIZATION  May 2010  ? normal  ? JOINT REPLACEMENT    ? TONSILLECTOMY  1970's?  ? TOTAL HIP ARTHROPLASTY Right 12/13/2016  ? TOTAL HIP ARTHROPLASTY Right 12/13/2016  ? Procedure: RIGHT TOTAL HIP ARTHROPLASTY ANTERIOR APPROACH;  Surgeon: Kathryne Hitch, MD;  Location: [facility] OR;  Service: Orthopedics;  Laterality: Right;  ? ?Social History  ? ?Social History Narrative  ? Not on file  ? ?Immunization History  ?Administered Date(s) Administered  ? Influenza,inj,Quad PF,6+ Mos 08/08/2014  ? PFIZER(Purple Top)SARS-COV-2 Vaccination 10/11/2019, 11/01/2019  ? PPD Test 06/18/2018  ? Pneumococcal Polysaccharide-23 08/08/2014  ? Tdap 11/15/2016  ?  ? ?Objective: ?Vital Signs: BP (!) 175/91 (BP Location: Right Arm, Patient Position: Sitting, Cuff Size: Normal)   Pulse 83   Resp 15   Ht 5' 10.5" (1.791 m)   Wt 156 lb (70.8 kg)   BMI 22.07 kg/m?   ? ?Physical Exam ?HENT:  ?   Mouth/Throat:  ?   Mouth: Mucous membranes are moist.  ?   Pharynx: Oropharynx is clear.  ?Eyes:  ?   Conjunctiva/sclera: Conjunctivae normal.  ?Cardiovascular:  ?   Rate and Rhythm: Normal rate and regular rhythm.  ?Pulmonary:  ?   Effort: Pulmonary effort is normal.  ?   Breath sounds: Normal breath sounds.  ?Musculoskeletal:  ?   Right lower leg: No edema.  ?   Left lower leg: No edema.  ?Skin: ?   General: Skin is warm and dry.  ?   Findings: Rash present.  ?   Comments: Patchy skin rashes on chest some areas with hyperpigmented edges, no  induration no warmth no tenderness  ?Neurological:  ?   Mental Status: He is alert.  ?Psychiatric:     ?   Mood and Affect: Mood normal.  ?  ? ?Musculoskeletal Exam:  ?Shoulders full ROM no tenderness or swelling ?Elbows full ROM, no tenderness, left elbow small swelling present and firm mobile nodules present ?Wrists full ROM chronic soft tissue change left side mild swelling present ?Fingers full ROM no tenderness or swelling, swelling with cyst at right 2nd digit between PIP and DIP joints ?Knees full ROM no tenderness or swelling ?Left ankle swelling and warmth, pain with pressure and ROM, right ankle normal ?Negative MTP squeeze tenderness ? ? ?CDAI Exam: ?CDAI Score: 13  ?Patient Global: 50 mm; Provider Global: 50 mm ?Swollen: 4 ; Tender: 1  ?Joint Exam 11/22/2021  ? ?   Right  Left  ?  Elbow     Swollen   ?Wrist     Swollen   ?PIP 2  Swollen      ?Ankle     Swollen Tender  ? ? ? ?Investigation: ?No additional findings. ? ?Imaging: ?XR Hand 2 View Left ? ?Result Date: 11/22/2021 ?X-ray left hand 2 views Radiocarpal joint space appears normal.  Mild first CMC joint degenerative changes few carpal bone subchondral cysts.  There are minimal degenerative changes in MCP PIP and DIP joint spaces.  No abnormal calcifications or erosions seen bone mineralization.  Normal. Impression Very mild degenerative changes present no erosive disease seen ? ?XR Hand 2 View Right ? ?Result Date: 11/22/2021 ?X-ray right hand 2 views Some osteoarthritis changes in radial ulnar joint.  Possible degenerative versus very old inflammatory and radial styloid.  There is a free body or ossicle on radial aspect of the joint.  Moderately advanced first CMC joint osteoarthritis and cystic changes in multiple carpal bones.  MCP joint spaces are preserved.  Minimal degenerative change in DIP joints.  No abnormal calcifications or joint erosions seen. Impression Moderately advanced degenerative arthritis in the wrist and first [employer] joint otherwise  very mild no erosive changes or visible  ? ?Recent Labs: ?Lab Results  ?Component Value Date  ? WBC 9.2 02/05/2020  ? HGB 11.8 (L) 02/05/2020  ? PLT 387 02/05/2020  ? NA 132 (L) 11/18/2019  ? K 3.7 11/18/2019  ? CL 96 (

## 2021-11-22 ENCOUNTER — Ambulatory Visit: Payer: Self-pay

## 2021-11-22 ENCOUNTER — Other Ambulatory Visit: Payer: Self-pay

## 2021-11-22 ENCOUNTER — Ambulatory Visit (INDEPENDENT_AMBULATORY_CARE_PROVIDER_SITE_OTHER): Payer: Managed Care, Other (non HMO)

## 2021-11-22 ENCOUNTER — Ambulatory Visit (INDEPENDENT_AMBULATORY_CARE_PROVIDER_SITE_OTHER): Payer: Managed Care, Other (non HMO) | Admitting: Internal Medicine

## 2021-11-22 ENCOUNTER — Encounter: Payer: Self-pay | Admitting: Internal Medicine

## 2021-11-22 VITALS — BP 175/91 | HR 83 | Resp 15 | Ht 70.5 in | Wt 156.0 lb

## 2021-11-22 DIAGNOSIS — Z79899 Other long term (current) drug therapy: Secondary | ICD-10-CM | POA: Diagnosis not present

## 2021-11-22 DIAGNOSIS — M069 Rheumatoid arthritis, unspecified: Secondary | ICD-10-CM

## 2021-11-22 DIAGNOSIS — M06322 Rheumatoid nodule, left elbow: Secondary | ICD-10-CM | POA: Diagnosis not present

## 2021-11-22 MED ORDER — METHOTREXATE 2.5 MG PO TABS
15.0000 mg | ORAL_TABLET | ORAL | 0 refills | Status: DC
  Filled 2021-11-22: qty 24, 28d supply, fill #0

## 2021-11-22 MED ORDER — PREDNISONE 10 MG PO TABS
10.0000 mg | ORAL_TABLET | Freq: Every day | ORAL | 0 refills | Status: DC
  Filled 2021-11-22: qty 14, 14d supply, fill #0

## 2021-11-22 MED ORDER — FOLIC ACID 1 MG PO TABS
1.0000 mg | ORAL_TABLET | Freq: Every day | ORAL | 0 refills | Status: DC
  Filled 2021-11-22: qty 30, 30d supply, fill #0
  Filled 2022-01-21 – 2022-02-07 (×2): qty 30, 30d supply, fill #1

## 2021-11-22 NOTE — Patient Instructions (Signed)
Methotrexate Tablets What is this medication? METHOTREXATE (METH oh TREX ate) treats inflammatory conditions such as arthritis and psoriasis. It works by decreasing inflammation, which can reduce pain and prevent long-term injury to the joints and skin. It may also be used to treat some types of cancer. It works by slowing down the growth of cancer cells. This medicine may be used for other purposes; ask your health care provider or pharmacist if you have questions. COMMON BRAND NAME(S): Rheumatrex, Trexall What should I tell my care team before I take this medication? They need to know if you have any of these conditions: Fluid in the stomach area or lungs If you often drink alcohol Infection or immune system problems Kidney disease or on hemodialysis Liver disease Low blood counts, like low white cell, platelet, or red cell counts Lung disease Radiation therapy Stomach ulcers Ulcerative colitis An unusual or allergic reaction to methotrexate, other medications, foods, dyes, or preservatives Pregnant or trying to get pregnant Breast-feeding How should I use this medication? Take this medication by mouth with a glass of water. Follow the directions on the prescription label. Take your medication at regular intervals. Do not take it more often than directed. Do not stop taking except on your care team's advice. Make sure you know why you are taking this medication and how often you should take it. If this medication is used for a condition that is not cancer, like arthritis or psoriasis, it should be taken weekly, NOT daily. Taking this medication more often than directed can cause serious side effects, even death. Talk to your care team about safe handling and disposal of this medication. You may need to take special precautions. Talk to your care team about the use of this medication in children. While this medication may be prescribed for selected conditions, precautions do  apply. Overdosage: If you think you have taken too much of this medicine contact a poison control center or emergency room at once. NOTE: This medicine is only for you. Do not share this medicine with others. What if I miss a dose? If you miss a dose, talk with your care team. Do not take double or extra doses. What may interact with this medication? Do not take this medication with any of the following: Acitretin This medication may also interact with the following: Aspirin and aspirin-like medications including salicylates Azathioprine Certain antibiotics like penicillins, tetracycline, and chloramphenicol Certain medications that treat or prevent blood clots like warfarin, apixaban, dabigatran, and rivaroxaban Certain medications for stomach problems like esomeprazole, omeprazole, pantoprazole Cyclosporine Dapsone Diuretics Gold Hydroxychloroquine Live virus vaccines Medications for infection like acyclovir, adefovir, amphotericin B, bacitracin, cidofovir, foscarnet, ganciclovir, gentamicin, pentamidine, vancomycin Mercaptopurine NSAIDs, medications for pain and inflammation, like ibuprofen or naproxen Other cytotoxic agents Pamidronate Pemetrexed Penicillamine Phenylbutazone Phenytoin Probenecid Pyrimethamine Retinoids such as isotretinoin and tretinoin Steroid medications like prednisone or cortisone Sulfonamides like sulfasalazine and trimethoprim/sulfamethoxazole Theophylline Zoledronic acid This list may not describe all possible interactions. Give your health care provider a list of all the medicines, herbs, non-prescription drugs, or dietary supplements you use. Also tell them if you smoke, drink alcohol, or use illegal drugs. Some items may interact with your medicine. What should I watch for while using this medication? Avoid alcoholic drinks. This medication can make you more sensitive to the sun. Keep out of the sun. If you cannot avoid being in the sun, wear  protective clothing and use sunscreen. Do not use sun lamps or tanning beds/booths. You may need   blood work done while you are taking this medication. Call your care team for advice if you get a fever, chills or sore throat, or other symptoms of a cold or flu. Do not treat yourself. This medication decreases your body's ability to fight infections. Try to avoid being around people who are sick. This medication may increase your risk to bruise or bleed. Call your care team if you notice any unusual bleeding. Be careful brushing or flossing your teeth or using a toothpick because you may get an infection or bleed more easily. If you have any dental work done, tell your dentist you are receiving this medication. Check with your care team if you get an attack of severe diarrhea, nausea and vomiting, or if you sweat a lot. The loss of too much body fluid can make it dangerous for you to take this medication. Talk to your care team about your risk of cancer. You may be more at risk for certain types of cancers if you take this medication. Do not become pregnant while taking this medication or for 6 months after stopping it. Women should inform their care team if they wish to become pregnant or think they might be pregnant. Men should not father a child while taking this medication and for 3 months after stopping it. There is potential for serious harm to an unborn child. Talk to your care team for more information. Do not breast-feed an infant while taking this medication or for 1 week after stopping it. This medication may make it more difficult to get pregnant or father a child. Talk to your care team if you are concerned about your fertility. What side effects may I notice from receiving this medication? Side effects that you should report to your care team as soon as possible: Allergic reactions--skin rash, itching, hives, swelling of the face, lips, tongue, or throat Blood clot--pain, swelling, or warmth  in the leg, shortness of breath, chest pain Dry cough, shortness of breath or trouble breathing Infection--fever, chills, cough, sore throat, wounds that don't heal, pain or trouble when passing urine, general feeling of discomfort or being unwell Kidney injury--decrease in the amount of urine, swelling of the ankles, hands, or feet Liver injury--right upper belly pain, loss of appetite, nausea, light-colored stool, dark yellow or brown urine, yellowing of the skin or eyes, unusual weakness or fatigue Low red blood cell count--unusual weakness or fatigue, dizziness, headache, trouble breathing Redness, blistering, peeling, or loosening of the skin, including inside the mouth Seizures Unusual bruising or bleeding Side effects that usually do not require medical attention (report to your care team if they continue or are bothersome): Diarrhea Dizziness Hair loss Nausea Pain, redness, or swelling with sores inside the mouth or throat Vomiting This list may not describe all possible side effects. Call your doctor for medical advice about side effects. You may report side effects to FDA at 1-800-FDA-1088. Where should I keep my medication? Keep out of the reach of children and pets. Store at room temperature between 20 and 25 degrees C (68 and 77 degrees F). Protect from light. Get rid of any unused medication after the expiration date. Talk to your care team about how to dispose of unused medication. Special directions may apply. NOTE: This sheet is a summary. It may not cover all possible information. If you have questions about this medicine, talk to your doctor, pharmacist, or health care provider.  2023 Elsevier/Gold Standard (2020-09-14 00:00:00)  

## 2021-11-23 LAB — CBC WITH DIFFERENTIAL/PLATELET
Absolute Monocytes: 890 cells/uL (ref 200–950)
Basophils Absolute: 80 cells/uL (ref 0–200)
Basophils Relative: 0.9 %
Eosinophils Absolute: 18 cells/uL (ref 15–500)
Eosinophils Relative: 0.2 %
HCT: 37.6 % — ABNORMAL LOW (ref 38.5–50.0)
Hemoglobin: 11.6 g/dL — ABNORMAL LOW (ref 13.2–17.1)
Lymphs Abs: 2314 cells/uL (ref 850–3900)
MCH: 25.1 pg — ABNORMAL LOW (ref 27.0–33.0)
MCHC: 30.9 g/dL — ABNORMAL LOW (ref 32.0–36.0)
MCV: 81.2 fL (ref 80.0–100.0)
MPV: 9.1 fL (ref 7.5–12.5)
Monocytes Relative: 10 %
Neutro Abs: 5598 cells/uL (ref 1500–7800)
Neutrophils Relative %: 62.9 %
Platelets: 470 10*3/uL — ABNORMAL HIGH (ref 140–400)
RBC: 4.63 10*6/uL (ref 4.20–5.80)
RDW: 12.7 % (ref 11.0–15.0)
Total Lymphocyte: 26 %
WBC: 8.9 10*3/uL (ref 3.8–10.8)

## 2021-11-23 LAB — COMPLETE METABOLIC PANEL WITH GFR
AG Ratio: 0.8 (calc) — ABNORMAL LOW (ref 1.0–2.5)
ALT: 12 U/L (ref 9–46)
AST: 12 U/L (ref 10–35)
Albumin: 3.6 g/dL (ref 3.6–5.1)
Alkaline phosphatase (APISO): 87 U/L (ref 35–144)
BUN: 10 mg/dL (ref 7–25)
CO2: 28 mmol/L (ref 20–32)
Calcium: 9.7 mg/dL (ref 8.6–10.3)
Chloride: 103 mmol/L (ref 98–110)
Creat: 1.01 mg/dL (ref 0.70–1.30)
Globulin: 4.7 g/dL (calc) — ABNORMAL HIGH (ref 1.9–3.7)
Glucose, Bld: 83 mg/dL (ref 65–99)
Potassium: 4.6 mmol/L (ref 3.5–5.3)
Sodium: 138 mmol/L (ref 135–146)
Total Bilirubin: 0.3 mg/dL (ref 0.2–1.2)
Total Protein: 8.3 g/dL — ABNORMAL HIGH (ref 6.1–8.1)
eGFR: 87 mL/min/{1.73_m2} (ref >=60)

## 2021-11-23 LAB — HEPATITIS B SURFACE ANTIGEN: Hepatitis B Surface Ag: NONREACTIVE

## 2021-11-23 LAB — RHEUMATOID FACTOR: Rheumatoid fact SerPl-aCnc: >1000 IU/mL — ABNORMAL HIGH (ref <14)

## 2021-11-23 LAB — HEPATITIS C ANTIBODY
Hepatitis C Ab: NONREACTIVE
SIGNAL TO CUT-OFF: 0.08 (ref <1.00)

## 2021-11-23 LAB — URIC ACID: Uric Acid, Serum: 7.4 mg/dL (ref 4.0–8.0)

## 2021-11-23 LAB — SEDIMENTATION RATE: Sed Rate: 119 mm/h — ABNORMAL HIGH (ref 0–20)

## 2021-11-23 LAB — HEPATITIS B CORE ANTIBODY, IGM: Hep B C IgM: NONREACTIVE

## 2021-11-23 NOTE — Progress Notes (Signed)
Labs are all fine for taking the methotrexate as planned. His sed rate and rheumatoid factor are very high so that's consistent with active RA.

## 2021-12-14 ENCOUNTER — Telehealth: Payer: Self-pay | Admitting: Internal Medicine

## 2021-12-14 DIAGNOSIS — M069 Rheumatoid arthritis, unspecified: Secondary | ICD-10-CM

## 2021-12-14 NOTE — Telephone Encounter (Signed)
Patient called the office stating he started Methotrexate about a month ago and the swelling in his left ankle has gotten much worse. Patient states he is having to call out of  work because he can't walk. Patient states he can't move and it swells up to the size of his thigh muscles everyday. Patient requests a call back with what he can do.

## 2021-12-15 ENCOUNTER — Other Ambulatory Visit: Payer: Self-pay

## 2021-12-15 MED ORDER — METHOTREXATE 2.5 MG PO TABS
15.0000 mg | ORAL_TABLET | ORAL | 0 refills | Status: DC
  Filled 2021-12-15: qty 30, 30d supply, fill #0
  Filled 2021-12-23: qty 24, 28d supply, fill #0

## 2021-12-15 MED ORDER — TRAMADOL HCL 50 MG PO TABS
50.0000 mg | ORAL_TABLET | Freq: Four times a day (QID) | ORAL | 0 refills | Status: AC | PRN
  Filled 2021-12-15 – 2021-12-23 (×2): qty 20, 5d supply, fill #0

## 2021-12-15 NOTE — Telephone Encounter (Signed)
Patient returned call to the office. Patient states he spoke with Dr. Dimple Casey and he sent in two prescriptions for him. Patient states he has a follow up in about 2 weeks.

## 2021-12-15 NOTE — Telephone Encounter (Signed)
Attempted to return call no answer and voice mailbox is not set up. Since when did symptoms get worse? Was he seeing any improvement on the prednisone for first 2 weeks? If so we could continue this longer if methotrexate is not yet seeing any improvement.

## 2021-12-15 NOTE — Telephone Encounter (Signed)
Attempted to contact patent, no answer and voice mailbox is not set up.

## 2021-12-21 NOTE — Progress Notes (Signed)
Office Visit Note  Patient: Dennis Harris             Date of Birth: 1965-10-30           MRN: [id number]             PCP: Pcp, No Referring: No ref. provider found Visit Date: 12/28/2021   Subjective:  Follow-up (Left ankle pain and swelling)   History of Present Illness: Dennis Harris is a 56 y.o. male here for follow up for  history of RA after starting methotrexate 15 mg PO weekly. He feels his joint pain and swelling are improved in most areas but the left ankle is not getting any better. He took prednisone initially but even while on this his ankle did not decrease swelling noticeably. 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  Previous HPI 11/22/2021 Dennis Harris is a 56 y.o. male here for joint pains he has history of RA treated with methotrexate and recent ED visits due to flare ups treated with prednisone.  He was diagnosed sometime around 2007 to 2009 with rheumatoid arthritis due to joint inflammation in multiple areas.  He was on oral methotrexate for years for treatment which had been effective.  Also treated episodically with steroids for active inflammation which was generally beneficial and no major side effects.  Most recent other rheumatology provider with Dr. Nickola Major but has not seen her for more than a year and due to loss of insurance when he lost his previous employment.  As result he has been off any maintenance treatment throughout last year.  He also has a history of degenerative arthritis and AVN and had anterior cervical spine fusion and right hip replacement. He developed skin rashes on his chest years ago saw dermatology a decade or more ago was not thought to represent any significant ongoing process.  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   Labs reviewed 05/2018 ANA neg RF 168 CCP >250   Review  of Systems  Constitutional:  Negative for fatigue.  HENT:  Negative for mouth dryness.   Eyes:  Negative for dryness.  Respiratory:  Negative for shortness of breath.   Cardiovascular:  Positive for swelling in legs/feet.  Gastrointestinal:  Negative for constipation.  Endocrine: Negative for increased urination.  Genitourinary:  Negative for difficulty urinating.  Musculoskeletal:  Positive for joint pain, joint pain, joint swelling and morning stiffness.  Skin:  Negative for rash.  Allergic/Immunologic: Negative for susceptible to infections.  Neurological:  Negative for numbness.  Hematological:  Negative for bruising/bleeding tendency.  Psychiatric/Behavioral:  Positive for sleep disturbance.    PMFS History:  Patient Active Problem List   Diagnosis Date Noted   Pain in left ankle and joints of left foot 12/28/2021   High risk medication use 11/22/2021   Rheumatoid nodule of left elbow (HCC) 11/22/2021   Hyperlipidemia 11/29/2019   Tobacco abuse 11/29/2019   Avascular necrosis of hip, right (HCC) 12/13/2016   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   Avascular necrosis of bones of both hips (HCC) 12/07/2016   Pain of left hip joint 12/07/2016   Pain of right hip joint 12/07/2016   SIRS (systemic inflammatory response syndrome) (HCC) 08/07/2014   Cardiomyopathy- EF 40-45% presumed secondary to acute illness 08/07/2014   CAP (community acquired pneumonia)    Right upper  quadrant pain    Acute pericarditis 08/04/2014   Acute hyponatremia 08/04/2014   Protein-calorie malnutrition, severe ([place]) 08/04/2014   Sepsis ([place]) 08/03/2014   Legionella pneumonia ([place]) 08/03/2014   Rheumatoid arthritis ([place]) 08/03/2014   Chest pain 08/03/2014   Elevated troponin- not fel to be secondary to MI 08/03/2014   Nausea vomiting and diarrhea 08/03/2014   Cervical spondylosis without myelopathy 11/06/2012    Past Medical History:  Diagnosis Date   Avascular necrosis of bone of  hip (HCC)    right   Exertional shortness of breath    "at times" (11/14/2012)   Hypercholesteremia    Hypertension    Pericarditis ~ 2009   Pneumonia    Rheumatoid arthritis(714.0)     Family History  Problem Relation Age of Onset   Cancer Mother    Hypertension Mother    Stroke Mother    Diabetes Mother    Cirrhosis Father    Lupus Sister    Lupus Sister    Breast cancer Sister    Past Surgical History:  Procedure Laterality Date   ANTERIOR CERVICAL DECOMP/DISCECTOMY FUSION N/A 11/14/2012   Procedure: ANTERIOR CERVICAL DECOMPRESSION/DISCECTOMY FUSION 1 LEVEL C5-6;  Surgeon: Melina Schools, MD;  Location: [facility];  Service: Orthopedics;  Laterality: N/A;   APPENDECTOMY  1970's   BACK SURGERY     CARDIAC CATHETERIZATION  May 2010   normal   JOINT REPLACEMENT     TONSILLECTOMY  1970's?   TOTAL HIP ARTHROPLASTY Right 12/13/2016   TOTAL HIP ARTHROPLASTY Right 12/13/2016   Procedure: RIGHT TOTAL HIP ARTHROPLASTY ANTERIOR APPROACH;  Surgeon: Mcarthur Rossetti, MD;  Location: [facility];  Service: Orthopedics;  Laterality: Right;   Social History   Social History Narrative   Not on file   Immunization History  Administered Date(s) Administered   Influenza,inj,Quad PF,6+ Mos 08/08/2014   PFIZER(Purple Top)SARS-COV-2 Vaccination 10/11/2019, 11/01/2019   PPD Test 06/18/2018   Pneumococcal Polysaccharide-23 08/08/2014   Tdap 11/15/2016     Objective: Vital Signs: BP (!) 141/79 (BP Location: Left Arm, Patient Position: Sitting, Cuff Size: Normal)   Pulse 86   Resp 15   Ht 5' 10.5" (1.791 m)   Wt 151 lb 6.4 oz (68.7 kg)   BMI 21.42 kg/m    Physical Exam Cardiovascular:     Rate and Rhythm: Normal rate and regular rhythm.  Pulmonary:     Effort: Pulmonary effort is normal.     Breath sounds: Normal breath sounds.  Musculoskeletal:     Right lower leg: No edema.     Left lower leg: No edema.  Skin:    General: Skin is warm and dry.     Findings: No rash.   Neurological:     Mental Status: He is alert.  Psychiatric:        Mood and Affect: Mood normal.     Musculoskeletal Exam:  Shoulders full ROM no tenderness or swelling Left elbow nodule present over olecranon process Wrists full ROM chronic nodules present nontender Fingers with full ROM, no swelling, right 2nd digit cyst Knees full ROM no tenderness or swelling Left ankle swelling, tenderness to pressure and with movement, nodule on achilles tendon Limited ultrasound inspection showing effusion and soft tissue swelling appears to be around talonavicular and subtalar joints   CDAI Exam: CDAI Score: 10  Patient Global: 40 mm; Provider Global: 40 mm Swollen: 3 ; Tender: 1  Joint Exam 12/28/2021      Right  Left  Elbow  Swollen   Wrist     Swollen   Subtalar     Swollen Tender     Investigation: No additional findings.  Imaging: No results found.  Recent Labs: Lab Results  Component Value Date   WBC 8.9 11/22/2021   HGB 11.6 (L) 11/22/2021   PLT 470 (H) 11/22/2021   NA 138 11/22/2021   K 4.6 11/22/2021   CL 103 11/22/2021   CO2 28 11/22/2021   GLUCOSE 83 11/22/2021   BUN 10 11/22/2021   CREATININE 1.01 11/22/2021   BILITOT 0.3 11/22/2021   ALKPHOS 103 11/29/2019   AST 12 11/22/2021   ALT 12 11/22/2021   PROT 8.3 (H) 11/22/2021   ALBUMIN 3.9 11/29/2019   CALCIUM 9.7 11/22/2021   GFRAA >60 11/18/2019    Speciality Comments: No specialty comments available.  Procedures:  No procedures performed Allergies: Bee venom and Shellfish allergy   Assessment / Plan:     Visit Diagnoses: Rheumatoid arthritis involving multiple sites, unspecified whether rheumatoid factor present ([place]) - Plan: Sedimentation rate, Uric acid  Seropositive, nonerosive RA with nodulosis symptoms appear better than before outside of his foot problems. Will recheck sed rate that was very high. With symptoms in a pattern of flares, multiple nodules, large ankle soft tissue swelling he  has concern about gout, repeating uric acid but was not significantly high last visit. If results are okay can titrate methotrexate dose to 20 mg weekly continue folic acid daily.  High risk medication use - methotrexate at 15 mg p.o. weekly with folic acid 1 mg daily, prednisone 10mg  by mouth daily.  - Plan: CBC with Differential/Platelet, COMPLETE METABOLIC PANEL WITH GFR  Checking CBC and CMP for methotrexate toxicity monitoring after 4 weeks. He denies any noticeable side effects taking it so far.  Rheumatoid nodule of left elbow (HCC) - History and symptoms to be consistent with longstanding rheumatoid arthritis suspect nodules in the left elbow and forearm are rheumatoid nodules.  Stable nodules compared to last visit, rechecking uric acid again due to concerns for possible tophaceous changes but levels were okay at initial visit.  Pain in left ankle and joints of left foot - Plan: Ambulatory referral to Podiatry  Left ankle pain is out of proportion to other joint symptoms, maybe still the RA but question if this is having overuse/sprain problem too. I offered local joint injection today which he declined. I think he needs to rest this ankle joint, he has a brace that has not helped much. Wrote work note recommending he stay out to avoid exacerbating this for about 2 weeks till 6/19. Will refer to podiatry as well can see if they think he needs any additional management.  Orders: Orders Placed This Encounter  Procedures   Sedimentation rate   CBC with Differential/Platelet   COMPLETE METABOLIC PANEL WITH GFR   Uric acid   Ambulatory referral to Podiatry   No orders of the defined types were placed in this encounter.    Follow-Up Instructions: Return in about 3 months (around 03/30/2022) for RA on MTX f/u 77mos.   Collier Salina, MD  Note - This record has been created using [employer].  Chart creation errors have been sought, but may not always  have been located. Such  creation errors do not reflect on  the standard of medical care.

## 2021-12-22 ENCOUNTER — Other Ambulatory Visit: Payer: Self-pay

## 2021-12-23 ENCOUNTER — Other Ambulatory Visit: Payer: Self-pay

## 2021-12-27 ENCOUNTER — Other Ambulatory Visit: Payer: Self-pay

## 2021-12-28 ENCOUNTER — Ambulatory Visit (INDEPENDENT_AMBULATORY_CARE_PROVIDER_SITE_OTHER): Payer: Managed Care, Other (non HMO) | Admitting: Internal Medicine

## 2021-12-28 ENCOUNTER — Encounter: Payer: Self-pay | Admitting: Internal Medicine

## 2021-12-28 VITALS — BP 141/79 | HR 86 | Resp 15 | Ht 70.5 in | Wt 151.4 lb

## 2021-12-28 DIAGNOSIS — M069 Rheumatoid arthritis, unspecified: Secondary | ICD-10-CM

## 2021-12-28 DIAGNOSIS — M25572 Pain in left ankle and joints of left foot: Secondary | ICD-10-CM | POA: Diagnosis not present

## 2021-12-28 DIAGNOSIS — M06322 Rheumatoid nodule, left elbow: Secondary | ICD-10-CM | POA: Diagnosis not present

## 2021-12-28 DIAGNOSIS — Z79899 Other long term (current) drug therapy: Secondary | ICD-10-CM

## 2021-12-29 ENCOUNTER — Other Ambulatory Visit: Payer: Self-pay

## 2021-12-29 ENCOUNTER — Other Ambulatory Visit: Payer: Self-pay | Admitting: *Deleted

## 2021-12-29 DIAGNOSIS — M069 Rheumatoid arthritis, unspecified: Secondary | ICD-10-CM

## 2021-12-29 LAB — SEDIMENTATION RATE: Sed Rate: >130 mm/h — ABNORMAL HIGH (ref 0–20)

## 2021-12-29 LAB — CBC WITH DIFFERENTIAL/PLATELET
Absolute Monocytes: 754 cells/uL (ref 200–950)
Basophils Absolute: 70 cells/uL (ref 0–200)
Basophils Relative: 1.2 %
Eosinophils Absolute: 29 cells/uL (ref 15–500)
Eosinophils Relative: 0.5 %
HCT: 36.7 % — ABNORMAL LOW (ref 38.5–50.0)
Hemoglobin: 11.4 g/dL — ABNORMAL LOW (ref 13.2–17.1)
Lymphs Abs: 1253 cells/uL (ref 850–3900)
MCH: 25.2 pg — ABNORMAL LOW (ref 27.0–33.0)
MCHC: 31.1 g/dL — ABNORMAL LOW (ref 32.0–36.0)
MCV: 81 fL (ref 80.0–100.0)
MPV: 8.8 fL (ref 7.5–12.5)
Monocytes Relative: 13 %
Neutro Abs: 3695 cells/uL (ref 1500–7800)
Neutrophils Relative %: 63.7 %
Platelets: 450 10*3/uL — ABNORMAL HIGH (ref 140–400)
RBC: 4.53 10*6/uL (ref 4.20–5.80)
RDW: 14.4 % (ref 11.0–15.0)
Total Lymphocyte: 21.6 %
WBC: 5.8 10*3/uL (ref 3.8–10.8)

## 2021-12-29 LAB — COMPLETE METABOLIC PANEL WITH GFR
AG Ratio: 0.8 (calc) — ABNORMAL LOW (ref 1.0–2.5)
ALT: 15 U/L (ref 9–46)
AST: 21 U/L (ref 10–35)
Albumin: 3.8 g/dL (ref 3.6–5.1)
Alkaline phosphatase (APISO): 120 U/L (ref 35–144)
BUN: 10 mg/dL (ref 7–25)
CO2: 24 mmol/L (ref 20–32)
Calcium: 9.5 mg/dL (ref 8.6–10.3)
Chloride: 101 mmol/L (ref 98–110)
Creat: 1 mg/dL (ref 0.70–1.30)
Globulin: 4.9 g/dL (calc) — ABNORMAL HIGH (ref 1.9–3.7)
Glucose, Bld: 92 mg/dL (ref 65–99)
Potassium: 4.5 mmol/L (ref 3.5–5.3)
Sodium: 134 mmol/L — ABNORMAL LOW (ref 135–146)
Total Bilirubin: 0.3 mg/dL (ref 0.2–1.2)
Total Protein: 8.7 g/dL — ABNORMAL HIGH (ref 6.1–8.1)
eGFR: 88 mL/min/{1.73_m2} (ref >=60)

## 2021-12-29 LAB — URIC ACID: Uric Acid, Serum: 7.2 mg/dL (ref 4.0–8.0)

## 2021-12-29 MED ORDER — METHOTREXATE 2.5 MG PO TABS
20.0000 mg | ORAL_TABLET | ORAL | 2 refills | Status: DC
  Filled 2021-12-29: qty 40, 35d supply, fill #0

## 2021-12-29 NOTE — Progress Notes (Signed)
Lab results look fine after starting methotrexate. He can try increasing the dose to 8 tablets weekly like we discussed. His uric acid level is 7.2 this is normal so I do not think gout is a concern.

## 2022-01-04 ENCOUNTER — Ambulatory Visit (INDEPENDENT_AMBULATORY_CARE_PROVIDER_SITE_OTHER): Payer: Managed Care, Other (non HMO)

## 2022-01-04 ENCOUNTER — Ambulatory Visit (INDEPENDENT_AMBULATORY_CARE_PROVIDER_SITE_OTHER): Payer: Managed Care, Other (non HMO) | Admitting: Podiatry

## 2022-01-04 ENCOUNTER — Encounter: Payer: Self-pay | Admitting: Podiatry

## 2022-01-04 ENCOUNTER — Other Ambulatory Visit: Payer: Self-pay

## 2022-01-04 ENCOUNTER — Telehealth: Payer: Self-pay

## 2022-01-04 DIAGNOSIS — M25572 Pain in left ankle and joints of left foot: Secondary | ICD-10-CM

## 2022-01-04 MED ORDER — HYDROCODONE-ACETAMINOPHEN 5-325 MG PO TABS
1.0000 | ORAL_TABLET | Freq: Four times a day (QID) | ORAL | 0 refills | Status: DC | PRN
  Filled 2022-01-04: qty 15, 4d supply, fill #0

## 2022-01-04 MED ORDER — GABAPENTIN 300 MG PO CAPS
300.0000 mg | ORAL_CAPSULE | Freq: Every day | ORAL | 0 refills | Status: DC
  Filled 2022-01-04: qty 30, 30d supply, fill #0
  Filled 2022-02-07: qty 30, 30d supply, fill #1

## 2022-01-04 NOTE — Telephone Encounter (Signed)
Called patient to let him know Dr. Ardelle Anton sent gabapentin over to his pharmacy

## 2022-01-04 NOTE — Patient Instructions (Signed)
Unna Boot Care-remove in 5 days or sooner if there is any swelling or discoloration or pain. An Unna boot is a type of bandage (dressing) for the foot and leg. The dressing is a gauze wrap that is soaked with a type of medicine called zinc oxide. The gauze may also include other lotions and medicines that help in wound healing, such as calamine. An Unna boot may be used to treat: Open sores (ulcers) on the foot, heel, or leg. Swelling from disorders that affect the veins or lymphatic system (lymphedema). Skin conditions such as chronic inflammation caused by poor blood flow (stasis dermatitis). The dressing is applied by a health care provider. The gauze is wrapped around your lower extremity in several layers, usually starting at the toes and going upward to the knee. A dry outer wrap goes over the medicated wrap for support and compression.  Before applying the [employer], your health care provider will clean your leg and foot and may apply an antibiotic ointment. You may be asked to raise (elevate) your leg for a while to reduce swelling before the boot is applied. The boot will dry and harden after it is applied. The boot may need to be changed or replaced about twice a week. Follow these instructions at home: [place] as told by your health care provider. You may need to wear a slipper or shoe over the boot that is one or two sizes larger than normal. Check the skin around the boot every day. Tell your health care provider about any concerns. Do not stick anything inside the boot to scratch your skin. Doing that increases your risk of infection. Keep your [employer] clean and dry. Check every day for signs of infection. Check for: Redness, swelling, or pain in your foot or toes. Fluid or blood coming from the boot. Pus or a bad smell coming from the boot. Remove the boot and call your health care provider if you have signs of poor blood flow, such as: Your toes tingle or  become numb. Your toes turn cold or turn blue or pale. Your toes are more swollen or painful. You are unable to move your toes. Activity You may walk with the boot once it has dried. Ask your health care provider how much walking is safe for you. Avoid sitting for a long time without moving. Get up to take short walks as told by your health care provider. This is important to improve blood flow. Bathing Do not take baths, swim, or use a hot tub until your health care provider approves. Ask your health care provider if you may take showers. If your health care provider approves a bath or a shower, do not let the Unna boot get wet. If you take a shower, cover the boot with a watertight covering. If you take a bath, keep your leg with the boot out of the tub. General instructions Keep your leg elevated above the level of your heart while you are sitting or lying down. This will decrease swelling. Do not sit with your knee bent for long periods of time. Take over-the-counter and prescription medicines only as told by your health care provider. Do not use any products that contain nicotine or tobacco, such as cigarettes, e-cigarettes, and chewing tobacco. These can delay healing. If you need help quitting, ask your health care provider. Keep all follow-up visits as told by your health care provider. This is important. Contact a health care provider  if: Your skin feels itchy inside the boot. You have a burning sensation, a rash, or itchy, red, swollen areas of skin (hives) in the boot area. You have a fever or chills. You have any signs of infection, such as: New redness, swelling, or pain. More fluid or blood coming from the boot. Pus or a bad smell coming from the boot. You have increased numbness or pain in your foot or toes. You have any changes in skin color on your foot or toes, such as the skin turning blue or pale or developing patchy areas with spots. Your boot has been damaged or  feels like it is no longer fitting properly. Summary An [place] boot is a type of bandage (dressing) system for the foot and leg. The dressing is a gauze wrap that is soaked with a type of medicine (zinc oxide) to treat foot, heel, or leg ulcers, swelling from disorders that affect the veins or lymphatic system (lymphedema), and skin conditions caused by poor blood flow (stasis dermatitis). This dressing is applied by a health care provider. After it is applied, the boot will dry and harden. The boot may need to be changed or replaced about twice a week. Let your health care provider know if you have any signs of poor blood flow or infection. This information is not intended to replace advice given to you by your health care provider. Make sure you discuss any questions you have with your health care provider. Document Revised: 05/06/2021 Document Reviewed: 05/06/2021  Walking Boot, Adult  A walking boot holds your foot or ankle in place after an injury or a medical procedure. This helps with healing and prevents further injury. It has a hard, rigid outer frame that limits movement and supports your foot and leg. The inner lining is a layer of padded material. Walking boots also have adjustable straps to secure them over the foot and leg. A walking boot may be prescribed if you can put weight (bear weight) on your injured foot. How much you can walk while wearing the boot will depend on the type and severity of your injury. How to put on a walking boot There are different types of walking boots. Each type has specific instructions about how to wear it properly. Follow instructions from your health care provider, such as: Ask someone to help you put on the boot, if needed. Sit to put on your boot. Doing this is more comfortable and helps to prevent falls. Open up the boot fully. Place your foot into the boot so your heel rests against the back. Your toes should be supported by the base of the boot.  They should not hang over the front edge. Adjust the straps so the boot fits securely but is not too tight. Do not bend the hard frame of the boot to get a good fit. How to walk with a walking boot How much you can walk will depend on your injury. Some tips for managing with a boot include: Do not try to walk without wearing the boot unless your health care provider approves. Use other assistive walking devices, including crutches or canes, as told by your health care provider. On your uninjured foot, wear a shoe with a heel that is close to the height of the walking boot. Be careful when walking on surfaces that are uneven or wet. How to reduce swelling while using a walking boot  Rest your injured foot or leg as much as possible. If directed, put  ice on the injured area. To do this: Put ice in a plastic bag. Place a towel between your skin and the bag. Leave the ice on for 20 minutes, 2-3 times a day. Remove the ice if your skin turns bright red. This is very important. If you cannot feel pain, heat, or cold, you have a greater risk of damage to the area. Keep your injured foot or leg raised (elevated) above the level of your heart for at least 2?3 hours each day or as told by your health care provider. If swelling gets worse, loosen the boot. Rest and elevate your foot and leg. How to care for your skin and foot while using a walking boot Wear a long sock to protect your foot and leg from rubbing inside the boot. Take off the boot one time each day to check the injured area. Check your foot, the surrounding skin, and your leg to make sure there are no sores, rashes, swelling, or wounds. The skin should be a healthy color, not pale or blue. Try to notice if your walking pattern (gait) in the boot is fairly normal and that you are walking without a noticeable limp. Follow instructions from your health care provider about taking care of your incision or wound, if this applies. Clean and wash  the injured area as told by your health care provider. Gently dry your foot and leg before putting the boot back on. Removing your walking boot Follow directions from your health care provider for removing the walking boot. Generally, it is okay to remove your walking boot: When you are resting or sleeping. To clean your foot and leg. How to keep the walking boot clean Do not put any part of the boot in a washing machine or dryer. Do not use chemical cleaning products. These could irritate your skin, especially if you have a wound or an incision. Do not soak the liner of the boot. Use a washcloth with mild soap and water to clean the frame and the liner of the boot by hand. Allow the boot to air-dry completely before you put it back on your foot. Follow these instructions at home: Activity Your activity will be restricted depending on the type and severity of your injury. Follow instructions from your health care provider. Also: Bathe and shower as told by your health care provider. Do not do any activities that could make your injury worse. Do not drive if your affected foot is the one that you use for driving. Contact a health care provider if: The boot is cracked or damaged. The boot does not fit properly. Your foot or leg hurts. You have a rash, sore, or open sore (ulcer) on your foot or leg. The skin on your foot or leg is pale. You have a wound or incision on the foot and it is getting worse. Your skin becomes painful, red, or irritated. Your swelling does not get better or it gets worse. Get help right away if: You have numbness in your foot or leg. The skin on your foot or leg is cold, blue, or gray. Summary A walking boot holds your foot or ankle in place after an injury or a medical procedure. There are different types of walking boots. Follow instructions about how to correctly wear your boot. Ask someone to help you put on the boot, if needed. It is important to check  your skin and foot every day. Call your health care provider if you notice a rash  or sore on your foot or leg. This information is not intended to replace advice given to you by your health care provider. Make sure you discuss any questions you have with your health care provider. Document Revised: 05/04/2020 Document Reviewed: 05/04/2020 Elsevier Patient Education  [place] Patient Education  [place].

## 2022-01-05 ENCOUNTER — Other Ambulatory Visit: Payer: Self-pay

## 2022-01-11 ENCOUNTER — Telehealth: Payer: Self-pay | Admitting: Internal Medicine

## 2022-01-11 ENCOUNTER — Other Ambulatory Visit: Payer: Self-pay

## 2022-01-11 ENCOUNTER — Telehealth: Payer: Self-pay | Admitting: *Deleted

## 2022-01-11 DIAGNOSIS — M069 Rheumatoid arthritis, unspecified: Secondary | ICD-10-CM

## 2022-01-11 MED ORDER — METHOTREXATE 2.5 MG PO TABS
20.0000 mg | ORAL_TABLET | ORAL | 2 refills | Status: DC
  Filled 2022-01-11: qty 50, 30d supply, fill #0
  Filled 2022-01-18: qty 50, 42d supply, fill #0
  Filled 2022-01-21: qty 32, 28d supply, fill #0

## 2022-01-11 MED ORDER — PREDNISONE 10 MG PO TABS
10.0000 mg | ORAL_TABLET | Freq: Every day | ORAL | 0 refills | Status: DC
  Filled 2022-01-11: qty 14, 14d supply, fill #0

## 2022-01-11 NOTE — Telephone Encounter (Signed)
Patient is calling because he took his unna boot off because his toes were swelling. He is now in a lot of pain after removal and would like to make another appointment to see physician.He is scheduled for the 3 st. Spoke with patient and told to elevate the foot until speaking with physician, verbalized understanding.

## 2022-01-11 NOTE — Telephone Encounter (Signed)
Patient advised of the following.   1) Sent new Rx for the MTX it is supposed to fill for 40 tablets as written, which was also the case for order placed 6/7.   2) Will repeat the prednisone 10 mg x2 weeks he took last month for current symptoms.   3) Patient advised we do not have any FMLA paperwork to complete for him. Patient advised if he will have another copy sent to our office we will be happy to work on those for him. Patient expressed understanding.

## 2022-01-11 NOTE — Telephone Encounter (Signed)
1) Sent new Rx for the MTX it is supposed to fill for 40 tablets as written, which was also the case for order placed 6/7.  2) Will repeat the prednisone 10 mg x2 weeks he took last month for current symptoms.  3) FYI - I don't see FMLA document in my folder do we have this somewhere else?

## 2022-01-11 NOTE — Telephone Encounter (Signed)
(  1) Patient called stating Dr. Dimple Casey increased his Methotrexate to 8 tablets per week and the last refill he picked up they only gave him 24 tablets.    (2) Patient states he is also experiencing pain and swelling in his shoulders and fingers and requesting a prescription of Prednisone.  (3) Patient states he also dropped off FML paperwork and is checking when it might be completed.

## 2022-01-12 ENCOUNTER — Other Ambulatory Visit: Payer: Self-pay

## 2022-01-12 ENCOUNTER — Ambulatory Visit (INDEPENDENT_AMBULATORY_CARE_PROVIDER_SITE_OTHER): Payer: Managed Care, Other (non HMO) | Admitting: Podiatry

## 2022-01-12 ENCOUNTER — Encounter: Payer: Self-pay | Admitting: Podiatry

## 2022-01-12 ENCOUNTER — Other Ambulatory Visit: Payer: Self-pay | Admitting: Podiatry

## 2022-01-12 DIAGNOSIS — M25572 Pain in left ankle and joints of left foot: Secondary | ICD-10-CM | POA: Diagnosis not present

## 2022-01-12 DIAGNOSIS — M7752 Other enthesopathy of left foot: Secondary | ICD-10-CM

## 2022-01-12 DIAGNOSIS — M069 Rheumatoid arthritis, unspecified: Secondary | ICD-10-CM | POA: Diagnosis not present

## 2022-01-12 DIAGNOSIS — M25472 Effusion, left ankle: Secondary | ICD-10-CM

## 2022-01-12 NOTE — Addendum Note (Signed)
Addended by: Hedy Jacob on: 01/12/2022 03:19 PM   Modules accepted: Orders

## 2022-01-12 NOTE — Progress Notes (Signed)
  Subjective:  Patient ID: Dennis Harris, male    DOB: 07/08/1966,   MRN: [id number]  Chief Complaint  Patient presents with   Ankle Pain    Ankle swelling - left ankle swelling     56 y.o. male presents for concern of continued ankle pain and swelling. He saw Dr. Ardelle Anton last week for ongoing ankle pain and swelling. States it has been ongoing for about 3 months. He does have a history of RA and has been having flares in hands as well. He was placed in an unna boot at last visit and told if toes swelled to remove so he did and relates the pain was worse in boot. States since removing it has improved. Still concerned as to why he continues to have pain . Denies any other pedal complaints. Denies n/v/f/c.   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   Exertional shortness of breath    "at times" (11/14/2012)   Hypercholesteremia    Hypertension    Pericarditis ~ 2009   Pneumonia    Rheumatoid arthritis(714.0)     Objective:  Physical Exam: Vascular: DP/PT pulses 2/4 bilateral. CFT <3 seconds. Normal hair growth on digits. Mild edema noted to left ankle. No erythema or edema noted Skin. No lacerations or abrasions bilateral feet.  Musculoskeletal: MMT 5/5 bilateral lower extremities in DF, PF, Inversion and Eversion. Deceased ROM in DF of ankle joint. Tenderness with ROM of the ankle. Pain to anterior ankle joint line with palpation. No fluctuance or crepitation noted.  Neurological: Sensation intact to light touch.   Assessment:   1. Pain in left ankle and joints of left foot   2. Rheumatoid arthritis involving multiple sites, unspecified whether rheumatoid factor present [facility])      Plan:  Patient was evaluated and treated and all questions answered. Discussed  inflammation of joint and treatment options with patient.  Radiographs reviewed from prior.  Discussed attemptin aspiration of joint today and send off for pathology to  determine possible cause for continued swelling. Patient in agreement. Procedure below. Will follow results.  Continue in CAM boot for now.  Has prednisone prescribed a couple days ago from Rheumatology continue with this.  If not improvement discussed need for possible MRI.  Patient to return as scheduled with Dr. Ardelle Anton.    Procedure: Aspiration ankle joint  Discussed alternatives, risks, complications and verbal consent was obtained.  Location: Left ankle. Skin Prep: Betadine. Injectate: 1 cc of lidocaine over injection site.  Following this 18 gauge needle used to aspirate about 5 cc of thick yellow fluid from ankle. Syringe sent for pathology and one sent for culture.  Disposition: Patient tolerated procedure well. Injection site dressed with a band-aid.  Post-injection care was discussed and return precautions discussed.    Louann Sjogren, DPM

## 2022-01-12 NOTE — Telephone Encounter (Signed)
Will do!

## 2022-01-12 NOTE — Progress Notes (Signed)
Subjective:   Patient ID: Dennis Harris, male   DOB: 56 y.o.   MRN: [id number]   HPI 56 year old male presents the office with concerns of left foot and ankle swelling which started about 2 and half months ago.  He states he gets pain all over and is throbbing.  He states this started about 2 months ago when he went to rheumatoid flare.  He was given prednisone and on rheumatoid medications and all the other joint issues resolved except for the ankle.  He has been given tramadol for pain which has not been helping.  No injuries that he reports.  He states he has been out of work because of this issue.   Review of Systems  All other systems reviewed and are negative.  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   Exertional shortness of breath    "at times" (11/14/2012)   Hypercholesteremia    Hypertension    Pericarditis ~ 2009   Pneumonia    Rheumatoid arthritis(714.0)     Past Surgical History:  Procedure Laterality Date   ANTERIOR CERVICAL DECOMP/DISCECTOMY FUSION N/A 11/14/2012   Procedure: ANTERIOR CERVICAL DECOMPRESSION/DISCECTOMY FUSION 1 LEVEL C5-6;  Surgeon: Venita Lick, MD;  Location: [facility] OR;  Service: Orthopedics;  Laterality: N/A;   APPENDECTOMY  1970's   BACK SURGERY     CARDIAC CATHETERIZATION  May 2010   normal   JOINT REPLACEMENT     TONSILLECTOMY  1970's?   TOTAL HIP ARTHROPLASTY Right 12/13/2016   TOTAL HIP ARTHROPLASTY Right 12/13/2016   Procedure: RIGHT TOTAL HIP ARTHROPLASTY ANTERIOR APPROACH;  Surgeon: Kathryne Hitch, MD;  Location: [facility] OR;  Service: Orthopedics;  Laterality: Right;     Current Outpatient Medications:    gabapentin (NEURONTIN) 300 MG capsule, Take 1 capsule (300 mg total) by mouth once nightly at bedtime., Disp: 90 capsule, Rfl: 0   HYDROcodone-acetaminophen (NORCO/VICODIN) 5-325 MG tablet, Take 1 tablet by mouth every 6 (six) hours as needed., Disp: 15 tablet, Rfl: 0   amLODipine  (NORVASC) 5 MG tablet, take 1 tablet (5 mg) by mouth once daily, Disp: 90 tablet, Rfl: 3   aspirin 81 MG chewable tablet, Chew 1 tablet (81 mg total) by mouth 2 (two) times daily. (Patient not taking: Reported on 11/22/2021), Disp: 90 tablet, Rfl: 3   atorvastatin (LIPITOR) 40 MG tablet, Take 1 tablet (40 mg total) by mouth daily. (Patient not taking: Reported on 11/22/2021), Disp: 90 tablet, Rfl: 3   carvedilol (COREG) 3.125 MG tablet, Take 1 tablet (3.125 mg total) by mouth 2 (two) times daily. (Patient not taking: Reported on 11/22/2021), Disp: 180 tablet, Rfl: 3   chlorthalidone (HYGROTON) 25 MG tablet, Take 1 tablet (25 mg) by mouth once daily (Patient not taking: Reported on 11/22/2021), Disp: 30 tablet, Rfl: 3   Cholecalciferol (VITAMIN D-3) 125 MCG (5000 UT) TABS, Take 1 tablet by mouth daily., Disp: 90 tablet, Rfl: 3   ferrous sulfate 325 (65 FE) MG tablet, Take 1 tablet (325 mg total) by mouth daily with breakfast., Disp: 90 tablet, Rfl: 3   folic acid (FOLVITE) 1 MG tablet, Take 1 tablet (1 mg total) by mouth daily., Disp: 90 tablet, Rfl: 0   HYDROCORTISONE EX, Apply 1 application topically 2 (two) times daily as needed (for razor bumps/rash/itching.)., Disp: , Rfl:    losartan (COZAAR) 25 MG tablet, Take 1 tablet (25 mg) by mouth 2 times per day (Patient  not taking: Reported on 11/22/2021), Disp: 30 tablet, Rfl: 3   methotrexate (RHEUMATREX) 2.5 MG tablet, Take 8 tablets (20 mg total) by mouth once a week. Caution:Chemotherapy. Protect from light., Disp: 50 tablet, Rfl: 2   nabumetone (RELAFEN) 750 MG tablet, Take 1 tablet (750 mg total) by mouth 2 (two) times daily as needed. (Patient not taking: Reported on 11/22/2021), Disp: 60 tablet, Rfl: 6   nitroGLYCERIN (NITROSTAT) 0.4 MG SL tablet, Place 1 tablet (0.4 mg total) under the tongue every 5 (five) minutes as needed for chest pain., Disp: 50 tablet, Rfl: 3   Omega-3 Fatty Acids (FISH OIL) 1000 MG CAPS, Take 1,000 mg by mouth daily., Disp: , Rfl:     predniSONE (DELTASONE) 10 MG tablet, Take 1 tablet (10 mg total) by mouth daily with breakfast., Disp: 14 tablet, Rfl: 0   tiZANidine (ZANAFLEX) 2 MG tablet, Take 1-2 tablets (2-4 mg total) by mouth every 6 (six) hours as needed for muscle spasms. (Patient not taking: Reported on 11/22/2021), Disp: 60 tablet, Rfl: 1  Allergies  Allergen Reactions   Bee Venom Anaphylaxis   Shellfish Allergy Anaphylaxis and Hives          Objective:  Physical Exam  General: AAO x3, NAD  Dermatological: No open lesions are noted bilaterally.  Vascular: Dorsalis Pedis artery and Posterior Tibial artery pedal pulses are 2/4 bilateral with immedate capillary fill time.There is no pain with calf compression, swelling, warmth, erythema.   Neruologic: Grossly intact via light touch bilateral.  Negative Tinel sign.  After discussion he does have some burning and tingling.  Musculoskeletal: There is edema present left ankle there is no erythema or warmth.  There is some pain with ankle joint range of motion.  There is diffuse tenderness to the ankle and foot but no specific area pinpoint tenderness.  There is no areas of fluctuation or crepitation.  Muscular strength 5/5 in all groups tested bilateral.  Gait: Unassisted, Nonantalgic.       Assessment:   Left ankle capsulitis, chronic pain     Plan:  -Treatment options discussed including all alternatives, risks, and complications -Etiology of symptoms were discussed -X-rays were obtained and reviewed with the patient.  3 views of the foot, ankle were obtained.  There is edema noted to the ankle with no area of fracture.  No cortical erosion suggestive of osteomyelitis. -I discussed with him joint aspiration or injection but he declined this.  I did wrap his foot and ankle without any improvement recommend immobilization in a cam boot.  Prescribed Vicodin for pain.  No improvement or MRI. -Uric acid level within normal limits.  Sed rate is greater than 130  but has been chronically elevated for the last year. -After the patient left he came back asking the medical assistant about gabapentin.  I prescribed this for him.  Vivi Barrack DPM

## 2022-01-13 ENCOUNTER — Telehealth: Payer: Self-pay | Admitting: Internal Medicine

## 2022-01-13 NOTE — Telephone Encounter (Unsigned)
FYI: Patient called to let the office know that his FMLA paperwork should be faxed in the next 24-48 hours.  Patient confirmed fax number.

## 2022-01-14 ENCOUNTER — Other Ambulatory Visit: Payer: Self-pay

## 2022-01-14 NOTE — Telephone Encounter (Signed)
FMLA paperwork received and given to Dr. Dimple Casey to complete. Will fax once completed.

## 2022-01-18 ENCOUNTER — Other Ambulatory Visit: Payer: Self-pay | Admitting: Podiatry

## 2022-01-18 ENCOUNTER — Other Ambulatory Visit: Payer: Self-pay

## 2022-01-18 MED ORDER — DOXYCYCLINE HYCLATE 100 MG PO TABS
100.0000 mg | ORAL_TABLET | Freq: Two times a day (BID) | ORAL | 0 refills | Status: DC
  Filled 2022-01-18 – 2022-01-26 (×2): qty 28, 14d supply, fill #0

## 2022-01-18 NOTE — Progress Notes (Signed)
Results from ankle aspiration showing growth of stentrophomonas maltophilia that is susceptible to doxycycline. Doxycycline sent to pharmacy. Attempted to call patient to notify of results. Left message about antibiotics. States if he is having any worsening pain or systemic symptoms in that ankle to report to the ER. Has MRI on 7/2 and follow-up with Dr. Ardelle Anton on 7/14

## 2022-01-19 ENCOUNTER — Telehealth: Payer: Self-pay | Admitting: Podiatry

## 2022-01-19 DIAGNOSIS — M79676 Pain in unspecified toe(s): Secondary | ICD-10-CM

## 2022-01-19 NOTE — Telephone Encounter (Signed)
I received a message from Dr. Ralene Cork as below. I have tried to call the patient but no answer. Left VM to call back.   ---  I just got the aspiration results on this gentleman's ankle. Showing stentorophomonas maltophillia growth and susceptibility to doxycycline. I sent over the antibiotics and tried to call and left a message. Just wanted to make you aware of his results and see if you can reach out to him.   Thanks

## 2022-01-20 NOTE — Telephone Encounter (Signed)
Pt returned call and would like someone to call back to go over the results. He states he didn't have his phone on last night but will have it with him today.  Please advise

## 2022-01-23 ENCOUNTER — Ambulatory Visit
Admission: RE | Admit: 2022-01-23 | Discharge: 2022-01-23 | Disposition: A | Discharge status: Home / Self Care | Payer: Managed Care, Other (non HMO) | Source: Ambulatory Visit | Attending: Podiatry | Admitting: Podiatry

## 2022-01-23 DIAGNOSIS — M25472 Effusion, left ankle: Secondary | ICD-10-CM

## 2022-01-23 DIAGNOSIS — M7752 Other enthesopathy of left foot: Secondary | ICD-10-CM

## 2022-01-23 MED ORDER — GADOBENATE DIMEGLUMINE 529 MG/ML IV SOLN
15.0000 mL | Freq: Once | INTRAVENOUS | Status: AC | PRN
  Administered 2022-01-23: 15 mL via INTRAVENOUS

## 2022-01-24 ENCOUNTER — Other Ambulatory Visit: Payer: Self-pay

## 2022-01-26 ENCOUNTER — Other Ambulatory Visit: Payer: Self-pay

## 2022-01-26 ENCOUNTER — Other Ambulatory Visit: Payer: Managed Care, Other (non HMO)

## 2022-01-26 ENCOUNTER — Telehealth: Payer: Self-pay | Admitting: Podiatry

## 2022-01-26 NOTE — Telephone Encounter (Signed)
Pt called and got the mri results from my chart and is not understanding them. He is scheduled to follow up with you on 7.14 and I told him you were out of the office this week and would discuss at the appt but would send you a message as well.

## 2022-01-27 ENCOUNTER — Telehealth: Payer: Self-pay | Admitting: Podiatry

## 2022-01-27 ENCOUNTER — Other Ambulatory Visit: Payer: Self-pay

## 2022-01-27 ENCOUNTER — Ambulatory Visit (INDEPENDENT_AMBULATORY_CARE_PROVIDER_SITE_OTHER): Payer: Managed Care, Other (non HMO) | Admitting: Podiatry

## 2022-01-27 DIAGNOSIS — M00872 Arthritis due to other bacteria, left ankle and foot: Secondary | ICD-10-CM | POA: Diagnosis not present

## 2022-01-27 MED ORDER — OXYCODONE-ACETAMINOPHEN 5-325 MG PO TABS
1.0000 | ORAL_TABLET | ORAL | 0 refills | Status: DC | PRN
  Filled 2022-01-27: qty 30, 5d supply, fill #0

## 2022-01-27 NOTE — Telephone Encounter (Signed)
Patient was seen today in office -  he states he went to the pharmacy and his pain medication has not been called in .  He uses the [facility] [employer] Please advise

## 2022-01-27 NOTE — Progress Notes (Signed)
Subjective:  Patient ID: Dennis Harris, male    DOB: 1966/07/04,  MRN: [id number]  Chief Complaint  Patient presents with   cellulitis and abscess    Follow up right ankle MRI    56 y.o. male presents with the above complaint.  Patient presents with a follow-up of right ankle MRI for concern for septic arthritis/osteomyelitis.  Patient is known to Dr. Edgar Frisk. Ralene Cork.  He he had an ankle result aspiration done that shows bacterial growth.  Doxycycline was sent to the pharmacy.  He states he has not taken any antibiotics.  I encouraged him to take it.  He denies any nausea fever chills vomiting or any systemic signs of infection.  He does not have any redness to the right lower extremity just swelling around the ankle.  And pain   Review of Systems: Negative except as noted in the HPI. Denies N/V/F/Ch.  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   Exertional shortness of breath    "at times" (11/14/2012)   Hypercholesteremia    Hypertension    Pericarditis ~ 2009   Pneumonia    Rheumatoid arthritis(714.0)     Current Outpatient Medications:    amLODipine (NORVASC) 5 MG tablet, take 1 tablet (5 mg) by mouth once daily, Disp: 90 tablet, Rfl: 3   aspirin 81 MG chewable tablet, Chew 1 tablet (81 mg total) by mouth 2 (two) times daily. (Patient not taking: Reported on 11/22/2021), Disp: 90 tablet, Rfl: 3   atorvastatin (LIPITOR) 40 MG tablet, Take 1 tablet (40 mg total) by mouth daily. (Patient not taking: Reported on 11/22/2021), Disp: 90 tablet, Rfl: 3   carvedilol (COREG) 3.125 MG tablet, Take 1 tablet (3.125 mg total) by mouth 2 (two) times daily. (Patient not taking: Reported on 11/22/2021), Disp: 180 tablet, Rfl: 3   chlorthalidone (HYGROTON) 25 MG tablet, Take 1 tablet (25 mg) by mouth once daily (Patient not taking: Reported on 11/22/2021), Disp: 30 tablet, Rfl: 3   Cholecalciferol (VITAMIN D-3) 125 MCG (5000 UT) TABS, Take 1 tablet by  mouth daily., Disp: 90 tablet, Rfl: 3   doxycycline (VIBRA-TABS) 100 MG tablet, Take 1 tablet (100 mg total) by mouth 2 (two) times daily for 14 days., Disp: 28 tablet, Rfl: 0   ferrous sulfate 325 (65 FE) MG tablet, Take 1 tablet (325 mg total) by mouth daily with breakfast., Disp: 90 tablet, Rfl: 3   folic acid (FOLVITE) 1 MG tablet, Take 1 tablet (1 mg total) by mouth daily., Disp: 90 tablet, Rfl: 0   gabapentin (NEURONTIN) 300 MG capsule, Take 1 capsule (300 mg total) by mouth once nightly at bedtime., Disp: 90 capsule, Rfl: 0   HYDROcodone-acetaminophen (NORCO/VICODIN) 5-325 MG tablet, Take 1 tablet by mouth every 6 (six) hours as needed., Disp: 15 tablet, Rfl: 0   HYDROCORTISONE EX, Apply 1 application topically 2 (two) times daily as needed (for razor bumps/rash/itching.)., Disp: , Rfl:    losartan (COZAAR) 25 MG tablet, Take 1 tablet (25 mg) by mouth 2 times per day (Patient not taking: Reported on 11/22/2021), Disp: 30 tablet, Rfl: 3   methotrexate (RHEUMATREX) 2.5 MG tablet, Take 8 tablets (20 mg total) by mouth once a week. Caution:Chemotherapy. Protect from light., Disp: 50 tablet, Rfl: 2   nabumetone (RELAFEN) 750 MG tablet, Take 1 tablet (750 mg total) by mouth 2 (two) times daily as needed. (Patient not taking: Reported on 11/22/2021), Disp: 60 tablet,  Rfl: 6   nitroGLYCERIN (NITROSTAT) 0.4 MG SL tablet, Place 1 tablet (0.4 mg total) under the tongue every 5 (five) minutes as needed for chest pain., Disp: 50 tablet, Rfl: 3   Omega-3 Fatty Acids (FISH OIL) 1000 MG CAPS, Take 1,000 mg by mouth daily., Disp: , Rfl:    predniSONE (DELTASONE) 10 MG tablet, Take 1 tablet (10 mg total) by mouth daily with breakfast., Disp: 14 tablet, Rfl: 0   tiZANidine (ZANAFLEX) 2 MG tablet, Take 1-2 tablets (2-4 mg total) by mouth every 6 (six) hours as needed for muscle spasms. (Patient not taking: Reported on 11/22/2021), Disp: 60 tablet, Rfl: 1  Social History   Tobacco Use  Smoking Status Every Day    Packs/day: 0.50   Years: 37.00   Total pack years: 18.50   Types: Cigarettes  Smokeless Tobacco Never    Allergies  Allergen Reactions   Bee Venom Anaphylaxis   Shellfish Allergy Anaphylaxis and Hives   Objective:  There were no vitals filed for this visit. There is no height or weight on file to calculate BMI. Constitutional Well developed. Well nourished.  Vascular Dorsalis pedis pulses palpable bilaterally. Posterior tibial pulses palpable bilaterally. Capillary refill normal to all digits.  No cyanosis or clubbing noted. Pedal hair growth normal.  Neurologic Normal speech. Oriented to person, place, and time. Epicritic sensation to light touch grossly present bilaterally.  Dermatologic Nails well groomed and normal in appearance. No open wounds. No skin lesions.  Orthopedic: Pain on palpation to the subtalar joint talonavicular joint with range of motion of the ankle joint.  No pain in the forefoot or in the midfoot.  Pain with range of motion of the subtalar joint and the talonavicular joint.   Radiographs: IMPRESSION 1. MR findings consistent with septic arthritis and osteomyelitis involving the subtalar and talonavicular joints as detailed above. Small adjacent abscesses. 2. No definite MR findings for septic arthritis at the tibiotalar joint. 3. Mild myositis and cellulitis. 4. Tendinopathy and tenosynovitis involving the posterior tibialis tendon could not exclude septic tenosynovitis. Assessment:   1. Arthritis of left foot due to other bacteria [facility])    Plan:  Patient was evaluated and treated and all questions answered.  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 -He does  have history of rheumatoid arthritis for which she is being was being seen by rheumatologist. -He will benefit from admission into the hospital for IV antibiotics with infectious disease consult and surgical washout with biopsies. -He states that he cannot go to the emergency room until Monday.  I encouraged him that it is in his best benefit to go to the emergency room or the infection can worsen and can enter bloodstream.  He states understanding.  If he feels worse he states he can go earlier but for now he would like to go on Monday. -Weightbearing as tolerated in cam boot  No follow-ups on file.

## 2022-01-28 ENCOUNTER — Encounter: Payer: Self-pay | Admitting: Podiatry

## 2022-01-28 ENCOUNTER — Telehealth: Payer: Self-pay | Admitting: Podiatry

## 2022-01-28 ENCOUNTER — Other Ambulatory Visit: Payer: Self-pay

## 2022-01-28 NOTE — Telephone Encounter (Signed)
I spoke with Dennis Harris this morning and he was a little confused about his admission in the Emergency room. I told him that I suggest that he goes to the Emergency room, asap, not to put it off. He explained that its his 64 wedding anniversary and I told him, "Happy Anniversary", but he really needs to listen to his Physician and go to the Emergency room.  Patient states", he will try to go on Sunday instead of Monday. He then stated,"to have a great day and not to forget about the disability". I told him I am working on it, as we speak and to have a great day.

## 2022-01-31 ENCOUNTER — Inpatient Hospital Stay (HOSPITAL_COMMUNITY)
Admission: EM | Admit: 2022-01-31 | Discharge: 2022-02-05 | DRG: 478 | Disposition: A | Discharge status: Home / Self Care | Payer: Managed Care, Other (non HMO) | Attending: Internal Medicine | Admitting: Internal Medicine

## 2022-01-31 ENCOUNTER — Telehealth: Payer: Self-pay | Admitting: *Deleted

## 2022-01-31 ENCOUNTER — Other Ambulatory Visit: Payer: Self-pay

## 2022-01-31 DIAGNOSIS — Z79631 Long term (current) use of antimetabolite agent: Secondary | ICD-10-CM

## 2022-01-31 DIAGNOSIS — M069 Rheumatoid arthritis, unspecified: Secondary | ICD-10-CM | POA: Diagnosis present

## 2022-01-31 DIAGNOSIS — M009 Pyogenic arthritis, unspecified: Secondary | ICD-10-CM | POA: Diagnosis not present

## 2022-01-31 DIAGNOSIS — E7800 Pure hypercholesterolemia, unspecified: Secondary | ICD-10-CM | POA: Diagnosis present

## 2022-01-31 DIAGNOSIS — Z833 Family history of diabetes mellitus: Secondary | ICD-10-CM | POA: Diagnosis not present

## 2022-01-31 DIAGNOSIS — I10 Essential (primary) hypertension: Secondary | ICD-10-CM | POA: Diagnosis present

## 2022-01-31 DIAGNOSIS — M19072 Primary osteoarthritis, left ankle and foot: Secondary | ICD-10-CM | POA: Diagnosis not present

## 2022-01-31 DIAGNOSIS — Z7952 Long term (current) use of systemic steroids: Secondary | ICD-10-CM

## 2022-01-31 DIAGNOSIS — D75838 Other thrombocytosis: Secondary | ICD-10-CM | POA: Diagnosis present

## 2022-01-31 DIAGNOSIS — Z803 Family history of malignant neoplasm of breast: Secondary | ICD-10-CM | POA: Diagnosis not present

## 2022-01-31 DIAGNOSIS — E8809 Other disorders of plasma-protein metabolism, not elsewhere classified: Secondary | ICD-10-CM | POA: Diagnosis present

## 2022-01-31 DIAGNOSIS — M00872 Arthritis due to other bacteria, left ankle and foot: Secondary | ICD-10-CM | POA: Diagnosis not present

## 2022-01-31 DIAGNOSIS — Z91013 Allergy to seafood: Secondary | ICD-10-CM

## 2022-01-31 DIAGNOSIS — Z832 Family history of diseases of the blood and blood-forming organs and certain disorders involving the immune mechanism: Secondary | ICD-10-CM | POA: Diagnosis not present

## 2022-01-31 DIAGNOSIS — M659 Synovitis and tenosynovitis, unspecified: Secondary | ICD-10-CM | POA: Diagnosis present

## 2022-01-31 DIAGNOSIS — Z8249 Family history of ischemic heart disease and other diseases of the circulatory system: Secondary | ICD-10-CM

## 2022-01-31 DIAGNOSIS — L03116 Cellulitis of left lower limb: Secondary | ICD-10-CM | POA: Diagnosis present

## 2022-01-31 DIAGNOSIS — D509 Iron deficiency anemia, unspecified: Secondary | ICD-10-CM | POA: Diagnosis not present

## 2022-01-31 DIAGNOSIS — Z79899 Other long term (current) drug therapy: Secondary | ICD-10-CM | POA: Diagnosis not present

## 2022-01-31 DIAGNOSIS — N179 Acute kidney failure, unspecified: Secondary | ICD-10-CM | POA: Diagnosis present

## 2022-01-31 DIAGNOSIS — M05772 Rheumatoid arthritis with rheumatoid factor of left ankle and foot without organ or systems involvement: Secondary | ICD-10-CM | POA: Diagnosis not present

## 2022-01-31 DIAGNOSIS — E871 Hypo-osmolality and hyponatremia: Secondary | ICD-10-CM | POA: Diagnosis present

## 2022-01-31 DIAGNOSIS — D75839 Thrombocytosis, unspecified: Secondary | ICD-10-CM

## 2022-01-31 DIAGNOSIS — E782 Mixed hyperlipidemia: Secondary | ICD-10-CM | POA: Diagnosis not present

## 2022-01-31 DIAGNOSIS — Z823 Family history of stroke: Secondary | ICD-10-CM | POA: Diagnosis not present

## 2022-01-31 DIAGNOSIS — M869 Osteomyelitis, unspecified: Secondary | ICD-10-CM | POA: Diagnosis present

## 2022-01-31 DIAGNOSIS — F17210 Nicotine dependence, cigarettes, uncomplicated: Secondary | ICD-10-CM | POA: Diagnosis present

## 2022-01-31 DIAGNOSIS — A419 Sepsis, unspecified organism: Secondary | ICD-10-CM | POA: Diagnosis not present

## 2022-01-31 DIAGNOSIS — E785 Hyperlipidemia, unspecified: Secondary | ICD-10-CM | POA: Diagnosis present

## 2022-01-31 LAB — C-REACTIVE PROTEIN: CRP: 8.4 mg/dL — ABNORMAL HIGH (ref <1.0)

## 2022-01-31 LAB — COMPREHENSIVE METABOLIC PANEL
ALT: 15 U/L (ref 0–44)
AST: 18 U/L (ref 15–41)
Albumin: 2.8 g/dL — ABNORMAL LOW (ref 3.5–5.0)
Alkaline Phosphatase: 99 U/L (ref 38–126)
Anion gap: 15 (ref 5–15)
BUN: 5 mg/dL — ABNORMAL LOW (ref 6–20)
CO2: 20 mmol/L — ABNORMAL LOW (ref 22–32)
Calcium: 9.1 mg/dL (ref 8.9–10.3)
Chloride: 97 mmol/L — ABNORMAL LOW (ref 98–111)
Creatinine, Ser: 0.86 mg/dL (ref 0.61–1.24)
GFR, Estimated: >60 mL/min (ref >60)
Glucose, Bld: 92 mg/dL (ref 70–99)
Potassium: 3.8 mmol/L (ref 3.5–5.1)
Sodium: 132 mmol/L — ABNORMAL LOW (ref 135–145)
Total Bilirubin: 0.4 mg/dL (ref 0.3–1.2)
Total Protein: 8.9 g/dL — ABNORMAL HIGH (ref 6.5–8.1)

## 2022-01-31 LAB — CBC WITH DIFFERENTIAL/PLATELET
Abs Immature Granulocytes: 0.06 10*3/uL (ref 0.00–0.07)
Basophils Absolute: 0.1 10*3/uL (ref 0.0–0.1)
Basophils Relative: 1 %
Eosinophils Absolute: 0.1 10*3/uL (ref 0.0–0.5)
Eosinophils Relative: 1 %
HCT: 32.3 % — ABNORMAL LOW (ref 39.0–52.0)
Hemoglobin: 10.1 g/dL — ABNORMAL LOW (ref 13.0–17.0)
Immature Granulocytes: 1 %
Lymphocytes Relative: 24 %
Lymphs Abs: 2.5 10*3/uL (ref 0.7–4.0)
MCH: 24.9 pg — ABNORMAL LOW (ref 26.0–34.0)
MCHC: 31.3 g/dL (ref 30.0–36.0)
MCV: 79.6 fL — ABNORMAL LOW (ref 80.0–100.0)
Monocytes Absolute: 0.7 10*3/uL (ref 0.1–1.0)
Monocytes Relative: 7 %
Neutro Abs: 7.3 10*3/uL (ref 1.7–7.7)
Neutrophils Relative %: 66 %
Platelets: 464 10*3/uL — ABNORMAL HIGH (ref 150–400)
RBC: 4.06 MIL/uL — ABNORMAL LOW (ref 4.22–5.81)
RDW: 14 % (ref 11.5–15.5)
WBC: 10.7 10*3/uL — ABNORMAL HIGH (ref 4.0–10.5)
nRBC: 0 % (ref 0.0–0.2)

## 2022-01-31 LAB — LACTIC ACID, PLASMA: Lactic Acid, Venous: 1.3 mmol/L (ref 0.5–1.9)

## 2022-01-31 LAB — SEDIMENTATION RATE: Sed Rate: 120 mm/hr — ABNORMAL HIGH (ref 0–16)

## 2022-01-31 MED ORDER — HYDRALAZINE HCL 20 MG/ML IJ SOLN: 10.0000 mg | INTRAMUSCULAR | Status: DC | PRN

## 2022-01-31 MED ORDER — ACETAMINOPHEN 325 MG PO TABS
650.0000 mg | ORAL_TABLET | Freq: Four times a day (QID) | ORAL | Status: DC | PRN
  Administered 2022-02-05: 650 mg via ORAL
  Filled 2022-01-31: qty 2

## 2022-01-31 MED ORDER — ONDANSETRON HCL 4 MG PO TABS: 4.0000 mg | ORAL_TABLET | Freq: Four times a day (QID) | ORAL | Status: DC | PRN

## 2022-01-31 MED ORDER — MORPHINE SULFATE (PF) 2 MG/ML IV SOLN
1.0000 mg | INTRAVENOUS | Status: DC | PRN
  Administered 2022-02-01 – 2022-02-03 (×2): 1 mg via INTRAVENOUS
  Filled 2022-01-31 (×2): qty 1

## 2022-01-31 MED ORDER — HEPARIN SODIUM (PORCINE) 5000 UNIT/ML IJ SOLN
5000.0000 [IU] | Freq: Three times a day (TID) | INTRAMUSCULAR | Status: DC
Start: 2022-01-31 — End: 2022-02-06
  Administered 2022-01-31 – 2022-02-03 (×7): 5000 [IU] via SUBCUTANEOUS
  Filled 2022-01-31 (×9): qty 1

## 2022-01-31 MED ORDER — ONDANSETRON HCL 4 MG/2ML IJ SOLN: 4.0000 mg | Freq: Four times a day (QID) | INTRAMUSCULAR | Status: DC | PRN

## 2022-01-31 MED ORDER — SENNOSIDES-DOCUSATE SODIUM 8.6-50 MG PO TABS: 1.0000 | ORAL_TABLET | Freq: Every evening | ORAL | Status: DC | PRN

## 2022-01-31 MED ORDER — GABAPENTIN 300 MG PO CAPS
300.0000 mg | ORAL_CAPSULE | Freq: Every day | ORAL | Status: DC
  Administered 2022-01-31 – 2022-02-04 (×5): 300 mg via ORAL
  Filled 2022-01-31 (×5): qty 1

## 2022-01-31 MED ORDER — CHLORTHALIDONE 25 MG PO TABS
25.0000 mg | ORAL_TABLET | Freq: Every day | ORAL | Status: DC
  Administered 2022-02-01 – 2022-02-03 (×2): 25 mg via ORAL
  Filled 2022-01-31 (×5): qty 1

## 2022-01-31 MED ORDER — OXYCODONE HCL 5 MG PO TABS
5.0000 mg | ORAL_TABLET | ORAL | Status: DC | PRN
  Administered 2022-01-31 – 2022-02-01 (×3): 5 mg via ORAL
  Filled 2022-01-31 (×3): qty 1

## 2022-01-31 MED ORDER — LOSARTAN POTASSIUM 50 MG PO TABS
25.0000 mg | ORAL_TABLET | Freq: Every day | ORAL | Status: DC
  Administered 2022-02-01 – 2022-02-03 (×2): 25 mg via ORAL
  Filled 2022-01-31 (×2): qty 1

## 2022-01-31 MED ORDER — AMLODIPINE BESYLATE 5 MG PO TABS
5.0000 mg | ORAL_TABLET | Freq: Every day | ORAL | Status: DC
  Administered 2022-02-01 – 2022-02-05 (×4): 5 mg via ORAL
  Filled 2022-01-31 (×4): qty 1

## 2022-01-31 MED ORDER — ACETAMINOPHEN 650 MG RE SUPP: 650.0000 mg | Freq: Four times a day (QID) | RECTAL | Status: DC | PRN

## 2022-01-31 NOTE — Assessment & Plan Note (Addendum)
-  Patient not currently taking Atorvastatin per med reconciliation.

## 2022-01-31 NOTE — Assessment & Plan Note (Addendum)
-  Continue home Amlodipine 5 mg po Daily and increase to 10 mg p.o. daily for discharge but will hold Chlorthalidone 25 mg po Daily and Losartan 25 mg po Daily for discharge given his AKI -Continue to Monitor BP per Protocol -Last BP reading was a little elevated at 145/70 at the time of discharge

## 2022-01-31 NOTE — ED Provider Triage Note (Signed)
Emergency Medicine Provider Triage Evaluation Note  Dennis Harris , a 56 y.o. male  was evaluated in triage.  Pt complains of sent by Dr. Allena Katz. 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 -He does have history of rheumatoid arthritis for which she is being was being seen by rheumatologist. -He will benefit from admission into the hospital for IV antibiotics with infectious disease consult and surgical washout with biopsies. -He states that he cannot go to the emergency room until Monday.  I encouraged him that it is in his best benefit to go to the emergency room or the infection can worsen and can enter bloodstream.  He states understanding.  If he feels worse he states he can go earlier but for now he would like to go on Monday. -Weightbearing as tolerated in cam boot"  He isn't wearing the CAM walker  Patient denies fevers.  Still having pain.     Physical Exam  BP (!) 166/90   Pulse 92   Temp 98.4 F (36.9 C) (Oral)   Resp 16   SpO2 98%  Gen:   Awake, no distress   Resp:  Normal effort  MSK:   Moves extremities without difficulty  Other:  Mild edema of left ankle.  2+ left DP pulse  Medical Decision Making  Medically screening exam initiated at 1:24 PM.  Appropriate orders placed.  Dennis Harris was informed that the remainder of the evaluation will be completed by another provider, this initial triage assessment does not replace that evaluation, and the importance of remaining in the ED until their evaluation is complete.     Cristina Gong, [place] 01/31/22 1329

## 2022-01-31 NOTE — Assessment & Plan Note (Addendum)
-  Ongoing issue for about 1 month.   -MRI left ankle 7/2 showed changes consistent with septic arthritis and osteomyelitis.   -Podiatry planning for I&D with washout and bone biopsy 7/12 and the patient is status post irrigation and debridement of left foot and ankle with biopsy and per my discussion with the podiatrist did not feel that patient had septic arthritis but they did feel that the patient had some inflammatory fluid that was decompressed and drained and cultures were obtained -CRP is elevated 8.4 -Given chronicity of infection without signs of systemic illness will hold off on initiating IV antibiotics for now but low threshold to hold for any signs of developing sepsis.  Per podiatry documentation ankle aspiration performed 6/21 grew Stenotrophomonas maltophilia. -VTE Prophylaxis with Heparin 5,000 units sq q8h -Podiatry planning for washout on 7/12; anticipate collection of wound cultures and bone biopsy; per podiatry they may do a staged surgery and they are planning to take him back to the OR but given lack of purulence they feel that he is stable from this perspective -Hold IV antibiotics for now, start vancomycin and ceftriaxone if any signs of developing sepsis -Continue analgesics as needed IV morphine 1 mg every 4 as needed severe pain as well as p.o. oxycodone 5 to 10 mg every 4 as needed moderate pain -Bowel regimen with senna docusate 1 tab p.o. nightly as needed for mild constipation -Consulted infectious diseases after operative procedure and will follow-up on cultures and have them adjust and recommend antibiotics; they have now started the patient on minocycline as well as IV Bactrim we will continue further recommendations once sensitivities were available -Patient is now back on diet and podiatry recommending no dressing changes until he follows up in outpatient setting -We will defer weightbearing recommendations to the podiatrist

## 2022-01-31 NOTE — H&P (Signed)
History and Physical    [name] OIB:[id number] DOB: 03-Jul-1966 DOA: 01/31/2022  PCP: Pcp, No  Patient coming from: Home  I have personally briefly reviewed patient's old medical records in [place]  Chief Complaint: Left ankle infection  HPI: Dennis Harris is a 56 y.o. male with medical history significant for rheumatoid arthritis on methotrexate, HTN, HLD who presented to the ED for evaluation of left ankle infection.  [name] patient has been following with podiatry for management of left ankle pain and swelling.  Underwent aspiration of ankle joint on 01/12/2022.  Per office note, 5 cc of thick yellow fluid were aspirated and sent for pathology and culture.  Per podiatry note, aspiration showed growth of stenotrophomonas maltophilia susceptible to doxycycline.  He was prescribed a 2-week course of doxycycline but states that he did not take it.  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  On 7/6 his podiatrist recommended patient go to the ER for admission to undergo surgery for washout and bone biopsy however patient declined to do so until today (01/31/2022).  He has continued pain in slightly diminished ROM in the left ankle.  He has not seen any open wound or drainage.  He denies any injury to the ankle.  He denies any subjective fevers, chills, diaphoresis, chest pain, dyspnea,.  ED Course  Labs/Imaging on admission: I have personally reviewed following labs and imaging studies.  Initial vitals showed BP 166/90, pulse 92, RR 16, temp 98.4 F, SPO2 98% on room air.  Labs show WBC 10.7, hemoglobin 10.1, platelets 464,000, sodium 132, potassium 3.8, bicarb 20, BUN 5, creatinine 0.86, serum glucose 92, LFTs within normal limits, CRP 8.4, ESR 120, lactic acid 1.3.  Blood  cultures in process.  Podiatry were consulted and recommended medical admission.  Plan is for surgical washout on 02/02/2022.  The hospitalist service was consulted to admit for further evaluation and management.  Review of Systems: All systems reviewed and are negative except as documented in history of present illness above.   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   TOTAL HIP ARTHROPLASTY Right 12/13/2016   TOTAL HIP ARTHROPLASTY Right 12/13/2016   Procedure: RIGHT TOTAL HIP ARTHROPLASTY ANTERIOR APPROACH;  Surgeon: Mcarthur Rossetti, MD;  Location: [facility];  Service: Orthopedics;  Laterality: Right;    Social History:  reports that he has been smoking cigarettes. He has a 18.50 pack-year smoking history. He has never used smokeless tobacco. He reports current alcohol use of about 14.0 standard drinks of alcohol per week. He reports that he does not currently use drugs after having used the following drugs: Marijuana.  Allergies  Allergen Reactions   Bee Venom Anaphylaxis   Shellfish Allergy Anaphylaxis and Hives    Family History  Problem Relation Age of Onset   Cancer Mother    Hypertension Mother    Stroke Mother    Diabetes Mother    Cirrhosis Father    Lupus Sister    Lupus Sister    Breast cancer Sister      Prior to Admission medications    Medication Sig Start Date End Date Taking? Authorizing Provider  amLODipine (NORVASC) 5 MG tablet take 1 tablet (5 mg) by mouth once daily 04/12/21     aspirin 81 MG chewable tablet Chew 1 tablet (81 mg total) by mouth 2 (two) times daily. Patient not taking: Reported on 11/22/2021 06/22/17   Clent Demark, PA-C  atorvastatin (LIPITOR) 40 MG tablet Take 1 tablet (40 mg total) by mouth daily. Patient not taking: Reported on 11/22/2021 12/31/19 03/30/20  Lorretta Harp, MD  carvedilol (COREG) 3.125 MG tablet Take 1 tablet (3.125 mg total) by mouth 2 (two) times daily. Patient not taking: Reported on 11/22/2021 12/31/19 03/30/20  Lorretta Harp, MD  chlorthalidone (HYGROTON) 25 MG tablet Take 1 tablet (25 mg) by mouth once daily Patient not taking: Reported on 11/22/2021 06/23/21     Cholecalciferol (VITAMIN D-3) 125 MCG (5000 UT) TABS Take 1 tablet by mouth daily. 01/13/20   Hilts, Legrand Como, MD  doxycycline (VIBRA-TABS) 100 MG tablet Take 1 tablet (100 mg total) by mouth 2 (two) times daily for 14 days. 01/18/22 02/01/22  Lorenda Peck, DPM  ferrous sulfate 325 (65 FE) MG tablet Take 1 tablet (325 mg total) by mouth daily with breakfast. 11/20/19   Kerin Perna, NP  folic acid (FOLVITE) 1 MG tablet Take 1 tablet (1 mg total) by mouth daily. 11/22/21   Rice, [name], MD  gabapentin (NEURONTIN) 300 MG capsule Take 1 capsule (300 mg total) by mouth once nightly at bedtime. 01/04/22   Trula Slade, DPM  HYDROcodone-acetaminophen (NORCO/VICODIN) 5-325 MG tablet Take 1 tablet by mouth every 6 (six) hours as needed. 01/04/22   Trula Slade, DPM  HYDROCORTISONE EX Apply 1 application topically 2 (two) times daily as needed (for razor bumps/rash/itching.).    [provider]  losartan (COZAAR) 25 MG tablet Take 1 tablet (25 mg) by mouth 2 times per day Patient not taking: Reported on 11/22/2021 06/23/21     methotrexate (RHEUMATREX) 2.5 MG tablet Take 8 tablets (20 mg total) by mouth  once a week. Caution:Chemotherapy. Protect from light. 01/11/22   Rice, [name], MD  nabumetone (RELAFEN) 750 MG tablet Take 1 tablet (750 mg total) by mouth 2 (two) times daily as needed. Patient not taking: Reported on 11/22/2021 01/13/20   Hilts, Legrand Como, MD  nitroGLYCERIN (NITROSTAT) 0.4 MG SL tablet Place 1 tablet (0.4 mg total) under the tongue every 5 (five) minutes as needed for chest pain. 11/20/19   Kerin Perna, NP  Omega-3 Fatty Acids (FISH OIL) 1000 MG CAPS Take 1,000 mg by mouth daily.    [provider]  oxyCODONE-acetaminophen (PERCOCET) 5-325 MG tablet Take 1 tablet by mouth every 4 (four) hours as needed for severe pain. 01/27/22   Felipa Furnace, DPM  predniSONE (DELTASONE) 10 MG tablet Take 1 tablet (10 mg total) by mouth daily with breakfast. 01/11/22   Rice, [name], MD  tiZANidine (ZANAFLEX) 2 MG tablet Take 1-2 tablets (2-4 mg total) by mouth every 6 (six) hours  as needed for muscle spasms. Patient not taking: Reported on 11/22/2021 01/13/20   Eunice Blase, MD    Physical Exam: Vitals:   01/31/22 1930 01/31/22 2101 01/31/22 2200 01/31/22 2300  BP: (!) 178/104 (!) 175/90 (!) 165/86 (!) 149/88  Pulse: 82  91   Resp: (!) 21 16 15 20   Temp:   98.5 F (36.9 C)   TempSrc:   Oral   SpO2: 99%  99%    Constitutional: Resting in bed, NAD, calm, comfortable Eyes: EOMI, lids and conjunctivae normal ENMT: Mucous membranes are moist. Posterior pharynx clear of any exudate or lesions. Neck: normal, supple, no masses. Respiratory: clear to auscultation bilaterally, no wheezing, no crackles. Normal respiratory effort. No accessory muscle use.  Cardiovascular: Regular rate and rhythm, no murmurs / rubs / gallops. No extremity edema. 2+ pedal pulses. Abdomen: no tenderness, no masses palpated. No hepatosplenomegaly. Bowel sounds positive.  Musculoskeletal: no clubbing / cyanosis. No joint deformity upper and lower extremities.  Slightly diminished ROM left ankle,  no contractures. Normal muscle tone.  Skin: no rashes, lesions, ulcers. No induration.  Mild erythema and increased warmth to touch left ankle. Neurologic: Sensation intact. Strength 5/5 in all 4.  Psychiatric: Alert and oriented x 3. Normal mood.   EKG: Not performed.  Assessment/Plan Principal Problem:   Septic arthritis of ankle ([facility]) Active Problems:   Hypertension   Rheumatoid arthritis ([place])   Hyperlipidemia   ROSAIRE CUETO is a 56 y.o. male with medical history significant for rheumatoid arthritis on methotrexate, HTN, HLD who is admitted for left ankle septic arthritis/osteomyelitis.  Assessment and Plan: * Septic arthritis of ankle ([place]) Ongoing issue for about 1 month.  MRI left ankle 7/2 showed changes consistent with septic arthritis and osteomyelitis.  Podiatry planning for I&D 7/12.  Given chronicity of infection without signs of systemic illness will hold off on initiating IV antibiotics for now but low threshold to hold for any signs of developing sepsis.  Per podiatry documentation ankle aspiration performed 6/21 grew Stenotrophomonas maltophilia. -Podiatry planning for washout on 7/12; anticipate collection of wound cultures and bone biopsy -Hold IV antibiotics for now, start vancomycin and ceftriaxone if any signs of developing sepsis -Continue analgesics as needed  Hypertension Continue home amlodipine, chlorthalidone, losartan.  Hyperlipidemia Patient not currently taking atorvastatin per med reconciliation.  Rheumatoid arthritis ([facility]) Hold methotrexate.  DVT prophylaxis: heparin injection 5,000 Units Start: 01/31/22 2230 Code Status: Full code, confirmed with patient on admission Family Communication: Discussed with patient, he has discussed with family Disposition Plan: From home and likely discharge to home pending podiatry surgical intervention Consults called: Podiatry Severity of Illness: The appropriate patient status for this patient is  INPATIENT. Inpatient status is judged to be reasonable and necessary in order to provide the required intensity of service to ensure the patient's safety. The patient's presenting symptoms, physical exam findings, and initial radiographic and laboratory data in the context of their chronic comorbidities is felt to place them at high risk for further clinical deterioration. Furthermore, it is not anticipated that the patient will be medically stable for discharge from the hospital within 2 midnights of admission.   * I certify that at the point of admission it is my clinical judgment that the patient will require inpatient hospital care spanning beyond 2 midnights from the point of admission due to high intensity of service, high risk for further deterioration and high frequency of surveillance required.Zada Finders MD [facility]  If 7PM-7AM, please  contact night-coverage [email or web address]  01/31/2022, 11:25 PM

## 2022-01-31 NOTE — Hospital Course (Addendum)
Dennis Harris is a 56 y.o. 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  Patient started having left ankle pain for about a month and has been followed by podiatry.  He underwent aspiration of the ankle joint on 01/12/2021 which grew out stenotrophomonas maltophilia and was treated with doxycycline.  MRI on 01/23/2022 showed septic arthritis and osteomyelitis.  Podiatry recommended hospitalization but he only showed up to the hospital on 01/31/2022.  He continues have significant pain and diminished range of motion.  He currently has no open wound and he is admitted for inpatient surgery currently hemodynamically stable.  Podiatry is planning on taking patient for an incision and drainage with washout with bone biopsy and they are planning on opening of the subtalar joint as well as the talonavicular joint.    Currently still holding off antibiotics for better culture yield and infectious diseases has been consulted for further evaluation recommendations after his podiatric intervention.  After further discussion with the podiatrist the podiatrist feels there is no signs of septic arthritis will need to come to the OR but he did have some inflammatory fluid that was decompressed and drained and cultures were obtained.  Podiatry recommending consulting infectious diseases for antibiotic management and they felt the patient could be discharged under standpoint once cultures came back.  The infectious disease team evaluated and recommending starting IV Bactrim and minocycline to cover for Stenotrophomonas and follow the OR cultures.  They are going to call the patient's rheumatologist in regards to holding the methotrexate for now so now is on antibiotic.  And per podiatry no dressing changes until the patient is following up in the outpatient setting.  Unfortunately  patient's renal function slightly worsened in the setting of antibiotics and his home medication and his sodium dropped so we will stop his chlorthalidone and his ARB and start gentle IV fluid hydration.

## 2022-01-31 NOTE — Telephone Encounter (Signed)
01/28/2022  1:48 pm.  "I'm calling regarding [employer].  We're calling regarding an Oxycodone written by Nicholes Rough.  We were instructed that this patient was not to pick up this medication on it's own.  So, we're calling to get verbal communication to release this medication to the patient.  He doesn't want to pick up any of his other maintenance drugs.  He just wants the Oxycodone today.  Please give [place] a call back.  The patient is here waiting."

## 2022-01-31 NOTE — ED Provider Notes (Signed)
[facility] EMERGENCY DEPARTMENT Provider Note   CSN: [id number] Arrival date & time: 01/31/22  1227     History  Chief Complaint  Patient presents with   left ankle pain     Dennis Harris is a 56 y.o. male.  Presents emerged part due to concern for left ankle pain.  Has been having chronic pain and swelling to the left ankle for the last few months.  His podiatrist had an outpatient MRI which is concerning for infection and he was told to go to ER for admission, surgery.  Pain is currently well controlled, resting comfortably.  No fevers or chills.  HPI     Home Medications Prior to Admission medications   Medication Sig Start Date End Date Taking? Authorizing Provider  amLODipine (NORVASC) 5 MG tablet take 1 tablet (5 mg) by mouth once daily Patient taking differently: Take 5 mg by mouth daily. 04/12/21  Yes   chlorthalidone (HYGROTON) 25 MG tablet Take 1 tablet (25 mg) by mouth once daily Patient taking differently: Take 25 mg by mouth daily. 06/23/21  Yes   folic acid (FOLVITE) 1 MG tablet Take 1 tablet (1 mg total) by mouth daily. 11/22/21  Yes Rice, Jamesetta Orleans, MD  gabapentin (NEURONTIN) 300 MG capsule Take 1 capsule (300 mg total) by mouth once nightly at bedtime. 01/04/22  Yes Vivi Barrack, DPM  HYDROCORTISONE EX Apply 1 application topically 2 (two) times daily as needed (for razor bumps/rash/itching.).   Yes [provider]  ibuprofen (ADVIL) 200 MG tablet Take 800 mg by mouth every 6 (six) hours as needed for moderate pain.   Yes [provider]  losartan (COZAAR) 25 MG tablet Take 1 tablet (25 mg) by mouth 2 times per day Patient taking differently: Take 25 mg by mouth daily. 06/23/21  Yes   methotrexate (RHEUMATREX) 2.5 MG tablet Take 8 tablets (20 mg total) by mouth once a week. Caution:Chemotherapy. Protect from light. Patient taking differently: Take 20 mg by mouth once a week. Caution:Chemotherapy. Protect from light.  mondays 01/11/22  Yes Rice, Jamesetta Orleans, MD  nitroGLYCERIN (NITROSTAT) 0.4 MG SL tablet Place 1 tablet (0.4 mg total) under the tongue every 5 (five) minutes as needed for chest pain. 11/20/19  Yes [name], NP  oxyCODONE-acetaminophen (PERCOCET) 5-325 MG tablet Take 1 tablet by mouth every 4 (four) hours as needed for severe pain. 01/27/22  Yes Candelaria Stagers, DPM  aspirin 81 MG chewable tablet Chew 1 tablet (81 mg total) by mouth 2 (two) times daily. Patient not taking: Reported on 11/22/2021 06/22/17   Loletta Specter, PA-C  atorvastatin (LIPITOR) 40 MG tablet Take 1 tablet (40 mg total) by mouth daily. Patient not taking: Reported on 11/22/2021 12/31/19 03/30/20  Runell Gess, MD  carvedilol (COREG) 3.125 MG tablet Take 1 tablet (3.125 mg total) by mouth 2 (two) times daily. Patient not taking: Reported on 11/22/2021 12/31/19 03/30/20  Runell Gess, MD  Cholecalciferol (VITAMIN D-3) 125 MCG (5000 UT) TABS Take 1 tablet by mouth daily. Patient not taking: Reported on 01/31/2022 01/13/20   Hilts, Casimiro Needle, MD  ferrous sulfate 325 (65 FE) MG tablet Take 1 tablet (325 mg total) by mouth daily with breakfast. Patient not taking: Reported on 01/31/2022 11/20/19   [name], NP      Allergies    Bee venom and Shellfish allergy    Review of Systems   Review of Systems  Constitutional:  Negative for  chills and fever.  HENT:  Negative for ear pain and sore throat.   Eyes:  Negative for pain and visual disturbance.  Respiratory:  Negative for cough and shortness of breath.   Cardiovascular:  Negative for chest pain and palpitations.  Gastrointestinal:  Negative for abdominal pain and vomiting.  Genitourinary:  Negative for dysuria and hematuria.  Musculoskeletal:  Positive for arthralgias. Negative for back pain.  Skin:  Negative for color change and rash.  Neurological:  Negative for seizures and syncope.  All other systems reviewed and are negative.   Physical Exam Updated  Vital Signs BP (!) 149/88   Pulse 91   Temp 98.5 F (36.9 C) (Oral)   Resp 20   SpO2 99%  Physical Exam Vitals and nursing note reviewed.  Constitutional:      General: He is not in acute distress.    Appearance: He is well-developed.  HENT:     Head: Normocephalic and atraumatic.  Eyes:     Conjunctiva/sclera: Conjunctivae normal.  Cardiovascular:     Rate and Rhythm: Normal rate and regular rhythm.     Heart sounds: No murmur heard. Pulmonary:     Effort: Pulmonary effort is normal. No respiratory distress.     Breath sounds: Normal breath sounds.  Abdominal:     Palpations: Abdomen is soft.     Tenderness: There is no abdominal tenderness.  Musculoskeletal:        General: No swelling.     Cervical back: Neck supple.     Comments: There is some swelling to the left ankle, joint range of motion is mildly decreased, no overlying erythema  Skin:    General: Skin is warm and dry.     Capillary Refill: Capillary refill takes less than 2 seconds.  Neurological:     Mental Status: He is alert.  Psychiatric:        Mood and Affect: Mood normal.     ED Results / Procedures / Treatments   Labs (all labs ordered are listed, but only abnormal results are displayed) Labs Reviewed  COMPREHENSIVE METABOLIC PANEL - Abnormal; Notable for the following components:      Result Value   Sodium 132 (*)    Chloride 97 (*)    CO2 20 (*)    BUN 5 (*)    Total Protein 8.9 (*)    Albumin 2.8 (*)    All other components within normal limits  CBC WITH DIFFERENTIAL/PLATELET - Abnormal; Notable for the following components:   WBC 10.7 (*)    RBC 4.06 (*)    Hemoglobin 10.1 (*)    HCT 32.3 (*)    MCV 79.6 (*)    MCH 24.9 (*)    Platelets 464 (*)    All other components within normal limits  SEDIMENTATION RATE - Abnormal; Notable for the following components:   Sed Rate 120 (*)    All other components within normal limits  C-REACTIVE PROTEIN - Abnormal; Notable for the following  components:   CRP 8.4 (*)    All other components within normal limits  CULTURE, BLOOD (ROUTINE X 2)  CULTURE, BLOOD (ROUTINE X 2)  LACTIC ACID, PLASMA  HIV ANTIBODY (ROUTINE TESTING W REFLEX)  CBC  BASIC METABOLIC PANEL    EKG None  Radiology No results found.  Procedures Procedures    Medications Ordered in ED Medications  heparin injection 5,000 Units (5,000 Units Subcutaneous Given 01/31/22 2249)  acetaminophen (TYLENOL) tablet 650 mg (has no  administration in time range)    Or  acetaminophen (TYLENOL) suppository 650 mg (has no administration in time range)  oxyCODONE (Oxy IR/ROXICODONE) immediate release tablet 5 mg (5 mg Oral Given 01/31/22 2254)  morphine (PF) 2 MG/ML injection 1 mg (has no administration in time range)  ondansetron (ZOFRAN) tablet 4 mg (has no administration in time range)    Or  ondansetron (ZOFRAN) injection 4 mg (has no administration in time range)  senna-docusate (Senokot-S) tablet 1 tablet (has no administration in time range)  amLODipine (NORVASC) tablet 5 mg (has no administration in time range)  chlorthalidone (HYGROTON) tablet 25 mg (has no administration in time range)  gabapentin (NEURONTIN) capsule 300 mg (300 mg Oral Given 01/31/22 2249)  losartan (COZAAR) tablet 25 mg (has no administration in time range)  hydrALAZINE (APRESOLINE) injection 10 mg (has no administration in time range)    ED Course/ Medical Decision Making/ A&P                           Medical Decision Making Risk Decision regarding hospitalization.   56 year old male presents to ER due to concern for abnormal MRI.  His podiatrist had an outpatient MRI which was concerning for septic arthritis, osteomyelitis.  I discussed the case with Dr. Allena Katz on-call for podiatry.  He advises admission to medicine, hold antibiotics for now, likely surgery Wednesday.       Final Clinical Impression(s) / ED Diagnoses Final diagnoses:  Septic arthritis of left ankle, due to  unspecified organism [facility])    Rx / DC Orders ED Discharge Orders     None         Milagros Loll, MD 01/31/22 2328

## 2022-01-31 NOTE — Assessment & Plan Note (Addendum)
-  Continue to Hold methotrexate for now  -Dr. Thedore Mins has contacted the patient's primary rheumatologist Dr. Dimple Casey for further evaluation and recommendations for the methotrexate and prednisone

## 2022-01-31 NOTE — ED Triage Notes (Signed)
Pt reports being seen by [facility] MD on 01/28/09.Per pt, he was told to come to the ED. MD told him he had infection in his left ankle that would require surgery. Pt endorses 10/10 pain. Denies fevers/chills. HX: HTN

## 2022-01-31 NOTE — ED Notes (Signed)
Report received from [place], [place]. All questions answered. Patient placed in room, cardiac monitor in place. NAD noted. Patient denies any needs at this time. Call light in reach.

## 2022-02-01 ENCOUNTER — Encounter (HOSPITAL_COMMUNITY): Payer: Self-pay | Admitting: Internal Medicine

## 2022-02-01 DIAGNOSIS — M05772 Rheumatoid arthritis with rheumatoid factor of left ankle and foot without organ or systems involvement: Secondary | ICD-10-CM | POA: Diagnosis not present

## 2022-02-01 DIAGNOSIS — I10 Essential (primary) hypertension: Secondary | ICD-10-CM

## 2022-02-01 DIAGNOSIS — M009 Pyogenic arthritis, unspecified: Secondary | ICD-10-CM

## 2022-02-01 LAB — SURGICAL PCR SCREEN
MRSA, PCR: NEGATIVE
Staphylococcus aureus: NEGATIVE

## 2022-02-01 LAB — CBC
HCT: 32.9 % — ABNORMAL LOW (ref 39.0–52.0)
Hemoglobin: 10.6 g/dL — ABNORMAL LOW (ref 13.0–17.0)
MCH: 25.3 pg — ABNORMAL LOW (ref 26.0–34.0)
MCHC: 32.2 g/dL (ref 30.0–36.0)
MCV: 78.5 fL — ABNORMAL LOW (ref 80.0–100.0)
Platelets: 472 10*3/uL — ABNORMAL HIGH (ref 150–400)
RBC: 4.19 MIL/uL — ABNORMAL LOW (ref 4.22–5.81)
RDW: 14 % (ref 11.5–15.5)
WBC: 7.6 10*3/uL (ref 4.0–10.5)
nRBC: 0 % (ref 0.0–0.2)

## 2022-02-01 LAB — BASIC METABOLIC PANEL
Anion gap: 16 — ABNORMAL HIGH (ref 5–15)
BUN: 10 mg/dL (ref 6–20)
CO2: 21 mmol/L — ABNORMAL LOW (ref 22–32)
Calcium: 9.1 mg/dL (ref 8.9–10.3)
Chloride: 101 mmol/L (ref 98–111)
Creatinine, Ser: 1.11 mg/dL (ref 0.61–1.24)
GFR, Estimated: >60 mL/min (ref >60)
Glucose, Bld: 100 mg/dL — ABNORMAL HIGH (ref 70–99)
Potassium: 4.4 mmol/L (ref 3.5–5.1)
Sodium: 138 mmol/L (ref 135–145)

## 2022-02-01 LAB — HIV ANTIBODY (ROUTINE TESTING W REFLEX): HIV Screen 4th Generation wRfx: NONREACTIVE

## 2022-02-01 MED ORDER — ORAL CARE MOUTH RINSE: 15.0000 mL | OROMUCOSAL | Status: DC | PRN

## 2022-02-01 MED ORDER — MUPIROCIN 2 % EX OINT
1.0000 | TOPICAL_OINTMENT | Freq: Two times a day (BID) | CUTANEOUS | Status: DC
  Administered 2022-02-01 – 2022-02-04 (×6): 1 via NASAL
  Filled 2022-02-01 (×2): qty 22

## 2022-02-01 NOTE — ED Notes (Signed)
Pt A&O x4 in room. PT consumed 100% of breakfast .c/o pain in L ankle. Pt denies any SOB or CP. Updated on plan of care.

## 2022-02-01 NOTE — Progress Notes (Signed)
PROGRESS NOTE    Dennis Harris  UXN:[id number] DOB: 1965-08-16 DOA: 01/31/2022 PCP: Pcp, No    Brief Narrative:  56 year old gentleman with history of rheumatoid arthritis on methotrexate, hypertension hyperlipoidemia who is having about 1 month of left ankle pain and followed by podiatry, underwent aspiration of ankle joint on 6/21 growing stenotrophomonas maltophilia and treated with doxycycline, MRI 7/2 showing septic arthritis and osteomyelitis, podiatry recommended hospitalization since then but he only showed up on 7/10.  Continues to have pain and diminished range of motion.  No open wound.  Admitted for inpatient surgery.  Hemodynamically stable.   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  Currently hemodynamically stable.  Treated with doxycycline.  Blood cultures drawn. In order to improve micro biology yield and patient's stability, holding off on antibiotics until surgery. Incision drainage and debridement planned by podiatry tomorrow 7/12.  They may do staged surgery. We will consult ID after procedure and cultures. Weightbearing as tolerated.  Analgesics.  Essential hypertension: Blood pressure is stable on amlodipine, chlorthalidone and losartan.  Rheumatoid arthritis: Holding methotrexate.   DVT prophylaxis: heparin injection 5,000 Units Start: 01/31/22 2230   Code Status: Full code Family Communication: None Disposition Plan: Status is: Inpatient Remains inpatient appropriate because: Inpatient surgery planned.     Consultants:  Podiatry  Procedures:  None  Antimicrobials:  None   Subjective: Patient seen and examined.  No complaints at rest.  He is looking forward to surgery tomorrow.  He did talk to Dr. Allena Katz and was satisfied.  Objective: Vitals:   02/01/22 0400 02/01/22 0500 02/01/22 0600 02/01/22 0755  BP: (!) 167/103 (!) 137/101 (!) 171/77 (!) 157/93  Pulse: 84  84 100  Resp: 19 (!) 24  15 (!) 23  Temp:   98.1 F (36.7 C)   TempSrc:   Oral   SpO2: 98% 99% 99% 100%   No intake or output data in the 24 hours ending 02/01/22 1212 There were no vitals filed for this visit.  Examination:  General exam: Appears calm and comfortable.  On room air.  Looks comfortable. Alert and oriented x4.  Not in distress. Respiratory system: Clear to auscultation. Respiratory effort normal. Cardiovascular system: S1 & S2 heard, RRR. No JVD, murmurs, rubs, gallops or clicks. No pedal edema. Gastrointestinal system: Soft.  Nontender.  Bowel sound present.   Central nervous system: Alert and oriented. No focal neurological deficits. Extremities:  Diminished left ankle range of motion, mild tenderness.  Mild erythema and increased warmth.  Of the left ankle.    Data Reviewed: I have personally reviewed following labs and imaging studies  CBC: Recent Labs  Lab 01/31/22 1349 02/01/22 0520  WBC 10.7* 7.6  NEUTROABS 7.3  --   HGB 10.1* 10.6*  HCT 32.3* 32.9*  MCV 79.6* 78.5*  PLT 464* 472*   Basic Metabolic Panel: Recent Labs  Lab 01/31/22 1349 02/01/22 0520  NA 132* 138  K 3.8 4.4  CL 97* 101  CO2 20* 21*  GLUCOSE 92 100*  BUN 5* 10  CREATININE 0.86 1.11  CALCIUM 9.1 9.1   GFR: CrCl cannot be calculated (Unknown ideal weight.). Liver Function Tests: Recent Labs  Lab 01/31/22 1349  AST 18  ALT 15  ALKPHOS 99  BILITOT 0.4  PROT 8.9*  ALBUMIN 2.8*   No results for input(s): "LIPASE", "AMYLASE" in the last 168 hours. No results for input(s): "AMMONIA" in the last 168 hours. Coagulation Profile:  No results for input(s): "INR", "PROTIME" in the last 168 hours. Cardiac Enzymes: No results for input(s): "CKTOTAL", "CKMB", "CKMBINDEX", "TROPONINI" in the last 168 hours. BNP (last 3 results) No results for input(s): "PROBNP" in the last 8760 hours. HbA1C: No results for input(s): "HGBA1C" in the last 72 hours. CBG: No results for input(s): "GLUCAP" in the last 168  hours. Lipid Profile: No results for input(s): "CHOL", "HDL", "LDLCALC", "TRIG", "CHOLHDL", "LDLDIRECT" in the last 72 hours. Thyroid Function Tests: No results for input(s): "TSH", "T4TOTAL", "FREET4", "T3FREE", "THYROIDAB" in the last 72 hours. Anemia Panel: No results for input(s): "VITAMINB12", "FOLATE", "FERRITIN", "TIBC", "IRON", "RETICCTPCT" in the last 72 hours. Sepsis Labs: Recent Labs  Lab 01/31/22 1349  LATICACIDVEN 1.3    Recent Results (from the past 240 hour(s))  Culture, blood (routine x 2)     Status: None (Preliminary result)   Collection Time: 01/31/22  1:47 PM   Specimen: BLOOD  Result Value Ref Range Status   Specimen Description BLOOD RIGHT ARM  Final   Special Requests   Final    BOTTLES DRAWN AEROBIC AND ANAEROBIC Blood Culture results may not be optimal due to an inadequate volume of blood received in culture bottles   Culture   Final    NO GROWTH < 24 HOURS Performed at [facility], [address] [place], [place] [address]    Report Status PENDING  Incomplete  Culture, blood (routine x 2)     Status: None (Preliminary result)   Collection Time: 01/31/22  5:53 PM   Specimen: BLOOD  Result Value Ref Range Status   Specimen Description BLOOD LEFT ANTECUBITAL  Final   Special Requests   Final    BOTTLES DRAWN AEROBIC AND ANAEROBIC Blood Culture results may not be optimal due to an inadequate volume of blood received in culture bottles   Culture   Final    NO GROWTH < 12 HOURS Performed at [facility], [address], [place], [place] [address]    Report Status PENDING  Incomplete         Radiology Studies: No results found.      Scheduled Meds:  amLODipine  5 mg Oral Daily   chlorthalidone  25 mg Oral Daily   gabapentin  300 mg Oral QHS   heparin  5,000 Units Subcutaneous Q8H   losartan  25 mg Oral Daily   Continuous Infusions:   LOS: 1 day    Time spent: 35 minutes    Dorcas Carrow, MD [facility] Pager  [phone]

## 2022-02-01 NOTE — ED Notes (Signed)
Blood collected and sent to lab, patient sitting up on side of bed. Denies any needs at this time. Call light in reach. Patient refuses heparin shot, states it hurts too bad, discussed need with patient, patient stated he still didn't want it at this time.

## 2022-02-01 NOTE — ED Notes (Signed)
Patient asleep in bed at this time. NAD noted. Call light in reach.

## 2022-02-01 NOTE — ED Notes (Signed)
Pt in room resting NAD chest and falling

## 2022-02-01 NOTE — ED Notes (Signed)
Patient asleep at this time. NAD noted. Call light in reach.

## 2022-02-01 NOTE — Consult Note (Signed)
Subjective:  Patient ID: Dennis Harris, male    DOB: 28-Jul-1965,  MRN: [id number]  Chief Complaint  Patient presents with   left ankle pain     56 y.o. male presents medical history significant for rheumatoid arthritis on methotrexate, HTN, HLD who presented to the ED for evaluation of left ankle arthritis.  Patient underwent aspiration joint on 01/12/2022 which showed stenotrophomonas maltophilia.  Patient had an MRI done which showed septic arthritis/osteomyelitis involving subtalar and talonavicular joint.  Patient is known to Dr. Altamease Oiler.  He denies any nausea fever chills vomiting.  He does not have a white count.  No acute complaints   Review of Systems: Negative except as noted in the HPI. Denies N/V/F/Ch.  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   Exertional shortness of breath    "at times" (11/14/2012)   Hypercholesteremia    Hypertension    Pericarditis ~ 2009   Pneumonia    Rheumatoid arthritis(714.0)     Current Facility-Administered Medications:    acetaminophen (TYLENOL) tablet 650 mg, 650 mg, Oral, Q6H PRN **OR** acetaminophen (TYLENOL) suppository 650 mg, 650 mg, Rectal, Q6H PRN, Allena Katz, Vishal R, MD   amLODipine (NORVASC) tablet 5 mg, 5 mg, Oral, Daily, Mackinsey Pelland, Vishal R, MD   chlorthalidone (HYGROTON) tablet 25 mg, 25 mg, Oral, Daily, Ahonesty Woodfin, Vishal R, MD   gabapentin (NEURONTIN) capsule 300 mg, 300 mg, Oral, QHS, Dejuan Elman, Vishal R, MD, 300 mg at 01/31/22 2249   heparin injection 5,000 Units, 5,000 Units, Subcutaneous, Q8H, Charlsie Quest, MD, 5,000 Units at 01/31/22 2249   hydrALAZINE (APRESOLINE) injection 10 mg, 10 mg, Intravenous, Q4H PRN, Charlsie Quest, MD   losartan (COZAAR) tablet 25 mg, 25 mg, Oral, Daily, Herold Salguero, Vishal R, MD   morphine (PF) 2 MG/ML injection 1 mg, 1 mg, Intravenous, Q4H PRN, Allena Katz, Vishal R, MD   ondansetron (ZOFRAN) tablet 4 mg, 4 mg, Oral, Q6H PRN **OR** ondansetron (ZOFRAN)  injection 4 mg, 4 mg, Intravenous, Q6H PRN, Allena Katz, Vishal R, MD   oxyCODONE (Oxy IR/ROXICODONE) immediate release tablet 5 mg, 5 mg, Oral, Q4H PRN, Darreld Mclean R, MD, 5 mg at 01/31/22 2254   senna-docusate (Senokot-S) tablet 1 tablet, 1 tablet, Oral, QHS PRN, Charlsie Quest, MD  Current Outpatient Medications:    amLODipine (NORVASC) 5 MG tablet, take 1 tablet (5 mg) by mouth once daily (Patient taking differently: Take 5 mg by mouth daily.), Disp: 90 tablet, Rfl: 3   chlorthalidone (HYGROTON) 25 MG tablet, Take 1 tablet (25 mg) by mouth once daily (Patient taking differently: Take 25 mg by mouth daily.), Disp: 30 tablet, Rfl: 3   folic acid (FOLVITE) 1 MG tablet, Take 1 tablet (1 mg total) by mouth daily., Disp: 90 tablet, Rfl: 0   gabapentin (NEURONTIN) 300 MG capsule, Take 1 capsule (300 mg total) by mouth once nightly at bedtime., Disp: 90 capsule, Rfl: 0   HYDROCORTISONE EX, Apply 1 application topically 2 (two) times daily as needed (for razor bumps/rash/itching.)., Disp: , Rfl:    ibuprofen (ADVIL) 200 MG tablet, Take 800 mg by mouth every 6 (six) hours as needed for moderate pain., Disp: , Rfl:    losartan (COZAAR) 25 MG tablet, Take 1 tablet (25 mg) by mouth 2 times per day (Patient taking differently: Take 25 mg by mouth daily.), Disp: 30 tablet, Rfl: 3   methotrexate (RHEUMATREX) 2.5 MG tablet, Take 8 tablets (20 mg total)  by mouth once a week. Caution:Chemotherapy. Protect from light. (Patient taking differently: Take 20 mg by mouth once a week. Caution:Chemotherapy. Protect from light. mondays), Disp: 50 tablet, Rfl: 2   nitroGLYCERIN (NITROSTAT) 0.4 MG SL tablet, Place 1 tablet (0.4 mg total) under the tongue every 5 (five) minutes as needed for chest pain., Disp: 50 tablet, Rfl: 3   oxyCODONE-acetaminophen (PERCOCET) 5-325 MG tablet, Take 1 tablet by mouth every 4 (four) hours as needed for severe pain., Disp: 30 tablet, Rfl: 0   aspirin 81 MG chewable tablet, Chew 1 tablet (81 mg  total) by mouth 2 (two) times daily. (Patient not taking: Reported on 11/22/2021), Disp: 90 tablet, Rfl: 3   atorvastatin (LIPITOR) 40 MG tablet, Take 1 tablet (40 mg total) by mouth daily. (Patient not taking: Reported on 11/22/2021), Disp: 90 tablet, Rfl: 3   carvedilol (COREG) 3.125 MG tablet, Take 1 tablet (3.125 mg total) by mouth 2 (two) times daily. (Patient not taking: Reported on 11/22/2021), Disp: 180 tablet, Rfl: 3   Cholecalciferol (VITAMIN D-3) 125 MCG (5000 UT) TABS, Take 1 tablet by mouth daily. (Patient not taking: Reported on 01/31/2022), Disp: 90 tablet, Rfl: 3   ferrous sulfate 325 (65 FE) MG tablet, Take 1 tablet (325 mg total) by mouth daily with breakfast. (Patient not taking: Reported on 01/31/2022), Disp: 90 tablet, Rfl: 3  Social History   Tobacco Use  Smoking Status Every Day   Packs/day: 0.50   Years: 37.00   Total pack years: 18.50   Types: Cigarettes  Smokeless Tobacco Never    Allergies  Allergen Reactions   Bee Venom Anaphylaxis   Shellfish Allergy Anaphylaxis and Hives   Objective:   Vitals:   02/01/22 0500 02/01/22 0600  BP: (!) 137/101 (!) 171/77  Pulse:  84  Resp: (!) 24 15  Temp:  98.1 F (36.7 C)  SpO2: 99% 99%   There is no height or weight on file to calculate BMI. Constitutional Well developed. Well nourished.  Vascular Dorsalis pedis pulses palpable bilaterally. Posterior tibial pulses palpable bilaterally. Capillary refill normal to all digits.  No cyanosis or clubbing noted. Pedal hair growth normal.  Neurologic Normal speech. Oriented to person, place, and time. Epicritic sensation to light touch grossly present bilaterally.  Dermatologic Nails well groomed and normal in appearance. No open wounds. No skin lesions.  Orthopedic: Pain on palpation to the subtalar joint talonavicular joint with range of motion of the ankle joint.  No pain in the forefoot or in the midfoot.  Pain with range of motion of the subtalar joint and the  talonavicular joint.   Radiographs: IMPRESSION 1. MR findings consistent with septic arthritis and osteomyelitis involving the subtalar and talonavicular joints as detailed above. Small adjacent abscesses. 2. No definite MR findings for septic arthritis at the tibiotalar joint. 3. Mild myositis and cellulitis. 4. Tendinopathy and tenosynovitis involving the posterior tibialis tendon could not exclude septic tenosynovitis. Assessment:   1. Septic arthritis of left ankle, due to unspecified organism [facility])    Plan:  Patient was evaluated and treated and all questions answered.  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  I will plan on opening of the subtalar joint as well as the talonavicular joint. -We will hold off on antibiotics for better culture yield. -  Patient will benefit from infectious disease consult for further management -N.p.o. after midnight -This will likely be a staged procedure and I will plan on keeping it open and take him back to the OR on Friday versus Saturday based on culture results.  No follow-ups on file.

## 2022-02-01 NOTE — ED Notes (Signed)
Breakfast order placed ?

## 2022-02-02 ENCOUNTER — Encounter (HOSPITAL_COMMUNITY): Payer: Self-pay | Admitting: Internal Medicine

## 2022-02-02 ENCOUNTER — Inpatient Hospital Stay (HOSPITAL_COMMUNITY): Payer: Managed Care, Other (non HMO) | Admitting: Certified Registered Nurse Anesthetist

## 2022-02-02 ENCOUNTER — Other Ambulatory Visit: Payer: Self-pay

## 2022-02-02 ENCOUNTER — Encounter (HOSPITAL_COMMUNITY)
Admission: EM | Disposition: A | Discharge status: Home / Self Care | Payer: Self-pay | Source: Home / Self Care | Attending: Internal Medicine

## 2022-02-02 DIAGNOSIS — M19072 Primary osteoarthritis, left ankle and foot: Secondary | ICD-10-CM | POA: Diagnosis not present

## 2022-02-02 DIAGNOSIS — I10 Essential (primary) hypertension: Secondary | ICD-10-CM

## 2022-02-02 DIAGNOSIS — D75839 Thrombocytosis, unspecified: Secondary | ICD-10-CM

## 2022-02-02 DIAGNOSIS — M009 Pyogenic arthritis, unspecified: Secondary | ICD-10-CM

## 2022-02-02 DIAGNOSIS — E782 Mixed hyperlipidemia: Secondary | ICD-10-CM

## 2022-02-02 DIAGNOSIS — E871 Hypo-osmolality and hyponatremia: Secondary | ICD-10-CM | POA: Diagnosis not present

## 2022-02-02 DIAGNOSIS — D509 Iron deficiency anemia, unspecified: Secondary | ICD-10-CM

## 2022-02-02 DIAGNOSIS — A419 Sepsis, unspecified organism: Secondary | ICD-10-CM

## 2022-02-02 DIAGNOSIS — F17210 Nicotine dependence, cigarettes, uncomplicated: Secondary | ICD-10-CM

## 2022-02-02 HISTORY — PX: I & D EXTREMITY: SHX5045

## 2022-02-02 LAB — CBC WITH DIFFERENTIAL/PLATELET
Abs Immature Granulocytes: 0.06 10*3/uL (ref 0.00–0.07)
Basophils Absolute: 0 10*3/uL (ref 0.0–0.1)
Basophils Relative: 0 %
Eosinophils Absolute: 0.1 10*3/uL (ref 0.0–0.5)
Eosinophils Relative: 1 %
HCT: 32 % — ABNORMAL LOW (ref 39.0–52.0)
Hemoglobin: 10.3 g/dL — ABNORMAL LOW (ref 13.0–17.0)
Immature Granulocytes: 1 %
Lymphocytes Relative: 18 %
Lymphs Abs: 1.7 10*3/uL (ref 0.7–4.0)
MCH: 25.5 pg — ABNORMAL LOW (ref 26.0–34.0)
MCHC: 32.2 g/dL (ref 30.0–36.0)
MCV: 79.2 fL — ABNORMAL LOW (ref 80.0–100.0)
Monocytes Absolute: 0.8 10*3/uL (ref 0.1–1.0)
Monocytes Relative: 9 %
Neutro Abs: 6.8 10*3/uL (ref 1.7–7.7)
Neutrophils Relative %: 71 %
Platelets: 414 10*3/uL — ABNORMAL HIGH (ref 150–400)
RBC: 4.04 MIL/uL — ABNORMAL LOW (ref 4.22–5.81)
RDW: 13.7 % (ref 11.5–15.5)
WBC: 9.5 10*3/uL (ref 4.0–10.5)
nRBC: 0 % (ref 0.0–0.2)

## 2022-02-02 LAB — COMPREHENSIVE METABOLIC PANEL
ALT: 13 U/L (ref 0–44)
AST: 15 U/L (ref 15–41)
Albumin: 2.4 g/dL — ABNORMAL LOW (ref 3.5–5.0)
Alkaline Phosphatase: 83 U/L (ref 38–126)
Anion gap: 10 (ref 5–15)
BUN: 8 mg/dL (ref 6–20)
CO2: 22 mmol/L (ref 22–32)
Calcium: 9.1 mg/dL (ref 8.9–10.3)
Chloride: 99 mmol/L (ref 98–111)
Creatinine, Ser: 0.99 mg/dL (ref 0.61–1.24)
GFR, Estimated: >60 mL/min (ref >60)
Glucose, Bld: 98 mg/dL (ref 70–99)
Potassium: 4.1 mmol/L (ref 3.5–5.1)
Sodium: 131 mmol/L — ABNORMAL LOW (ref 135–145)
Total Bilirubin: 0.4 mg/dL (ref 0.3–1.2)
Total Protein: 8.5 g/dL — ABNORMAL HIGH (ref 6.5–8.1)

## 2022-02-02 LAB — MAGNESIUM: Magnesium: 1.8 mg/dL (ref 1.7–2.4)

## 2022-02-02 LAB — PHOSPHORUS: Phosphorus: 4.9 mg/dL — ABNORMAL HIGH (ref 2.5–4.6)

## 2022-02-02 SURGERY — IRRIGATION AND DEBRIDEMENT EXTREMITY
Anesthesia: General | Laterality: Left

## 2022-02-02 MED ORDER — MIDAZOLAM HCL 2 MG/2ML IJ SOLN
INTRAMUSCULAR | Status: DC | PRN
  Administered 2022-02-02 (×2): 1 mg via INTRAVENOUS

## 2022-02-02 MED ORDER — LIDOCAINE HCL (PF) 1 % IJ SOLN
INTRAMUSCULAR | Status: AC
  Filled 2022-02-02: qty 30

## 2022-02-02 MED ORDER — PROPOFOL 10 MG/ML IV BOLUS
INTRAVENOUS | Status: AC
  Filled 2022-02-02: qty 20

## 2022-02-02 MED ORDER — BUPIVACAINE HCL (PF) 0.5 % IJ SOLN
INTRAMUSCULAR | Status: AC
  Filled 2022-02-02: qty 30

## 2022-02-02 MED ORDER — FENTANYL CITRATE (PF) 250 MCG/5ML IJ SOLN
INTRAMUSCULAR | Status: DC | PRN
  Administered 2022-02-02 (×6): 25 ug via INTRAVENOUS

## 2022-02-02 MED ORDER — LIDOCAINE-EPINEPHRINE 1 %-1:100000 IJ SOLN
INTRAMUSCULAR | Status: AC
  Filled 2022-02-02: qty 1

## 2022-02-02 MED ORDER — HYDROMORPHONE HCL 1 MG/ML IJ SOLN
0.2500 mg | INTRAMUSCULAR | Status: DC | PRN
  Administered 2022-02-02: 0.5 mg via INTRAVENOUS

## 2022-02-02 MED ORDER — CEFAZOLIN SODIUM-DEXTROSE 2-3 GM-%(50ML) IV SOLR
INTRAVENOUS | Status: DC | PRN
  Administered 2022-02-02: 2 g via INTRAVENOUS

## 2022-02-02 MED ORDER — FENTANYL CITRATE (PF) 250 MCG/5ML IJ SOLN
INTRAMUSCULAR | Status: AC
  Filled 2022-02-02: qty 5

## 2022-02-02 MED ORDER — 0.9 % SODIUM CHLORIDE (POUR BTL) OPTIME
TOPICAL | Status: DC | PRN
  Administered 2022-02-02: 1000 mL

## 2022-02-02 MED ORDER — PHENYLEPHRINE 80 MCG/ML (10ML) SYRINGE FOR IV PUSH (FOR BLOOD PRESSURE SUPPORT)
PREFILLED_SYRINGE | INTRAVENOUS | Status: AC
  Filled 2022-02-02: qty 10

## 2022-02-02 MED ORDER — LACTATED RINGERS IV SOLN: INTRAVENOUS | Status: DC | PRN

## 2022-02-02 MED ORDER — LIDOCAINE 2% (20 MG/ML) 5 ML SYRINGE
INTRAMUSCULAR | Status: AC
  Filled 2022-02-02: qty 15

## 2022-02-02 MED ORDER — LIDOCAINE HCL (PF) 1 % IJ SOLN
INTRAMUSCULAR | Status: DC | PRN
  Administered 2022-02-02: 15 mL

## 2022-02-02 MED ORDER — OXYCODONE HCL 5 MG PO TABS
5.0000 mg | ORAL_TABLET | ORAL | Status: DC | PRN
  Administered 2022-02-02 – 2022-02-05 (×11): 10 mg via ORAL
  Filled 2022-02-02 (×11): qty 2

## 2022-02-02 MED ORDER — LIDOCAINE 2% (20 MG/ML) 5 ML SYRINGE
INTRAMUSCULAR | Status: DC | PRN
  Administered 2022-02-02: 100 mg via INTRAVENOUS

## 2022-02-02 MED ORDER — SODIUM CHLORIDE (PF) 0.9 % IJ SOLN
INTRAMUSCULAR | Status: AC
  Filled 2022-02-02: qty 10

## 2022-02-02 MED ORDER — ONDANSETRON HCL 4 MG/2ML IJ SOLN
INTRAMUSCULAR | Status: DC | PRN
  Administered 2022-02-02: 4 mg via INTRAVENOUS

## 2022-02-02 MED ORDER — MIDAZOLAM HCL 2 MG/2ML IJ SOLN
INTRAMUSCULAR | Status: AC
  Filled 2022-02-02: qty 2

## 2022-02-02 MED ORDER — PROPOFOL 10 MG/ML IV BOLUS
INTRAVENOUS | Status: DC | PRN
  Administered 2022-02-02: 100 mg via INTRAVENOUS
  Administered 2022-02-02: 30 mg via INTRAVENOUS

## 2022-02-02 MED ORDER — PHENYLEPHRINE 80 MCG/ML (10ML) SYRINGE FOR IV PUSH (FOR BLOOD PRESSURE SUPPORT)
PREFILLED_SYRINGE | INTRAVENOUS | Status: DC | PRN
  Administered 2022-02-02 (×2): 80 ug via INTRAVENOUS
  Administered 2022-02-02 (×4): 160 ug via INTRAVENOUS

## 2022-02-02 MED ORDER — SODIUM CHLORIDE 0.9 % IR SOLN
Status: DC | PRN
  Administered 2022-02-02: 2000 mL

## 2022-02-02 MED ORDER — DEXAMETHASONE SODIUM PHOSPHATE 10 MG/ML IJ SOLN
INTRAMUSCULAR | Status: DC | PRN
  Administered 2022-02-02: 4 mg via INTRAVENOUS

## 2022-02-02 MED ORDER — BUPIVACAINE HCL (PF) 0.5 % IJ SOLN
INTRAMUSCULAR | Status: DC | PRN
  Administered 2022-02-02: 15 mL

## 2022-02-02 MED ORDER — CEFAZOLIN SODIUM-DEXTROSE 2-4 GM/100ML-% IV SOLN
INTRAVENOUS | Status: AC
  Filled 2022-02-02: qty 100

## 2022-02-02 MED ORDER — CHLORHEXIDINE GLUCONATE 0.12 % MT SOLN
OROMUCOSAL | Status: AC
  Filled 2022-02-02: qty 15

## 2022-02-02 MED ORDER — HYDROMORPHONE HCL 1 MG/ML IJ SOLN
INTRAMUSCULAR | Status: AC
  Filled 2022-02-02: qty 1

## 2022-02-02 SURGICAL SUPPLY — 22 items
APL PRP STRL LF DISP 70% ISPRP (MISCELLANEOUS) ×1
BLADE SURG 15 STRL LF DISP TIS (BLADE) IMPLANT
BLADE SURG 15 STRL SS (BLADE) ×4
BNDG CMPR 9X4 STRL LF SNTH (GAUZE/BANDAGES/DRESSINGS) ×1
BNDG ELASTIC 4X5.8 VLCR STR LF (GAUZE/BANDAGES/DRESSINGS) ×1 IMPLANT
BNDG ESMARK 4X9 LF (GAUZE/BANDAGES/DRESSINGS) ×1 IMPLANT
BNDG GAUZE DERMACEA FLUFF (GAUZE/BANDAGES/DRESSINGS) ×1
BNDG GAUZE DERMACEA FLUFF 4 (GAUZE/BANDAGES/DRESSINGS) IMPLANT
BNDG GZE DERMACEA 4 6PLY (GAUZE/BANDAGES/DRESSINGS) ×1
CHLORAPREP W/TINT 26 (MISCELLANEOUS) ×1 IMPLANT
CNTNR URN SCR LID CUP LEK RST (MISCELLANEOUS) IMPLANT
CONT SPEC 4OZ STRL OR WHT (MISCELLANEOUS) ×8
DRSG PAD ABDOMINAL 8X10 ST (GAUZE/BANDAGES/DRESSINGS) ×1 IMPLANT
GAUZE PAD ABD 8X10 STRL (GAUZE/BANDAGES/DRESSINGS) ×1 IMPLANT
GAUZE SPONGE 4X4 12PLY STRL (GAUZE/BANDAGES/DRESSINGS) ×1 IMPLANT
GAUZE XEROFORM 1X8 LF (GAUZE/BANDAGES/DRESSINGS) ×1 IMPLANT
KIT BASIN OR (CUSTOM PROCEDURE TRAY) ×1 IMPLANT
PACK GENERAL/GYN (CUSTOM PROCEDURE TRAY) ×1 IMPLANT
SUT MNCRL AB 3-0 PS2 18 (SUTURE) ×1 IMPLANT
SWAB CULTURE ESWAB REG 1ML (MISCELLANEOUS) ×2 IMPLANT
TUBE CONNECTING 20X1/4 (TUBING) ×1 IMPLANT
YANKAUER SUCT BULB TIP NO VENT (SUCTIONS) ×1 IMPLANT

## 2022-02-02 NOTE — Assessment & Plan Note (Addendum)
-  Patient's sodium went from 132 -> 138 -> 131 x2 and dropped down to 128 so we held his chlorthalidone and started gentle IV fluid hydration and is improved to 130 -> 131 -May need to hold chlorthalidone if continues to drop -Repeat and trend sodium level within 1 week

## 2022-02-02 NOTE — Transfer of Care (Signed)
Immediate Anesthesia Transfer of Care Note  Patient: Dennis Harris  Procedure(s) Performed: IRRIGATION AND DEBRIDEMENT LEFT FOOT/ANKLE (Left)  Patient Location: PACU  Anesthesia Type:General  Level of Consciousness: awake and alert   Airway & Oxygen Therapy: Patient Spontanous Breathing  Post-op Assessment: Report given to RN and Post -op Vital signs reviewed and stable  Post vital signs: Reviewed and stable  Last Vitals:  Vitals Value Taken Time  BP 120/80   Temp    Pulse 100 02/02/22 1711  Resp 19 02/02/22 1711  SpO2 98 % 02/02/22 1711  Vitals shown include unvalidated device data.  Last Pain:  Vitals:   02/02/22 1605  TempSrc:   PainSc: 8       Patients Stated Pain Goal: 0 (02/02/22 0556)  Complications: No notable events documented.

## 2022-02-02 NOTE — Anesthesia Procedure Notes (Addendum)
Procedure Name: LMA Insertion Date/Time: 02/02/2022 4:17 PM  Performed by: Audie Pinto, CRNAPre-anesthesia Checklist: Patient identified, Emergency Drugs available, Suction available and Patient being monitored Patient Re-evaluated:Patient Re-evaluated prior to induction Oxygen Delivery Method: Circle system utilized Preoxygenation: Pre-oxygenation with 100% oxygen Induction Type: IV induction LMA: LMA inserted LMA Size: 4.0 Placement Confirmation: positive ETCO2 Dental Injury: Teeth and Oropharynx as per pre-operative assessment

## 2022-02-02 NOTE — Progress Notes (Signed)
PROGRESS NOTE    Dennis LAURENZI  Harris:[id number] DOB: [birth year] DOA: 01/31/2022 PCP: Pcp, No   Brief Narrative:  Dennis Harris is a 56 y.o. 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  Patient started having left ankle pain for about a month and has been followed by podiatry.  He underwent aspiration of the ankle joint on 01/12/2021 which grew out stenotrophomonas maltophilia and was treated with doxycycline.  MRI on 01/23/2022 showed septic arthritis and osteomyelitis.  Podiatry recommended hospitalization but he only showed up to the hospital on 01/31/2022.  He continues have significant pain and diminished range of motion.  He currently has no open wound and he is admitted for inpatient surgery currently hemodynamically stable.  Podiatry is planning on taking patient for an incision and drainage with washout with bone biopsy and they are planning on opening of the subtalar joint as well as the talonavicular joint.  Currently still holding off antibiotics for better culture yield and infectious diseases has been consulted for further evaluation recommendations after his podiatric intervention.    Assessment and Plan: * Septic arthritis of ankle (HCC) -Ongoing issue for about 1 month.   -MRI left ankle 7/2 showed changes consistent with septic arthritis and osteomyelitis.   -Podiatry planning for I&D with washout and bone biopsy 7/12.   -CRP is elevated 8.4 -Given chronicity of infection without signs of systemic illness will hold off on initiating IV antibiotics for now but low threshold to hold for any signs of developing sepsis.  Per podiatry documentation ankle aspiration performed 6/21 grew Stenotrophomonas maltophilia. -VTE Prophylaxis with Heparin 5,000 units sq q8h -Podiatry planning for washout on 7/12; anticipate collection of wound  cultures and bone biopsy; per podiatry they may do a staged surgery and they are planning to keep it open and take him back to the OR on Friday versus Saturday based on his culture results -Hold IV antibiotics for now, start vancomycin and ceftriaxone if any signs of developing sepsis -Continue analgesics as needed IV morphine 1 mg every 4 as needed severe pain as well as p.o. oxycodone 5 to 10 mg every 4 as needed moderate pain -Bowel regimen with senna docusate 1 tab p.o. nightly as needed for mild constipation -Consulted infectious diseases operative procedure and will follow-up on cultures -Currently patient is n.p.o. and weightbearing status per Podiatric Surgery   Hypertension -Continue home Amlodipine 5 mg po Daily , Chlorthalidone 25 mg po Daily and Losartan 25 mg po Daily -Continue to Monitor BP per Protocol -Last BP reading was a little elevated at 167/85  Hyponatremia -Patient's sodium went from 132 -> 138 -> 131 -May need to hold chlorthalidone if continues to drop -Repeat and trend sodium level in the a.m.  Thrombocytosis - Patient's platelet count went from 464 -> 472 -> 414 -Likely reactive in the setting of above -Continue to monitor and trend and repeat CBC in a.m.  Microcytic anemia - His hemoglobin/hematocrit went from 10.1/32.3 and trended up to 10.6/22.9 yesterday and is now 10.3/32.0 with an MCV of 79.2 -Check anemia panel in the a.m. -Continue to monitor for signs and symptoms bleeding; no overt bleeding noted -Repeat CBC in a.m.   Hyperlipidemia -Patient not currently taking atorvastatin per med reconciliation.  Rheumatoid arthritis (HCC) -Continue to Hold methotrexate.  DVT prophylaxis: heparin injection 5,000 Units Start: 01/31/22 2230    Code Status: Full Code  Family Communication: No family currently at bedside  Disposition Plan:  Level of care: Med-Surg Status is: Inpatient Remains inpatient appropriate because: Needs further podiatric  intervention and evaluation by infectious diseases   Consultants:  Podiatry Infectious Diseases  Procedures:  Patient will be going to the OR for I&D and washout with bone biopsy  Antimicrobials:  Anti-infectives (From admission, onward)    None       Subjective: Seen and examined at bedside and was complaining of some pain in his foot and tenderness.  States that he has been dealing with this for a long time.  No nausea or vomiting.  Denies any lightheadedness or dizziness.  No other concerns or plaints at this time.  Objective: Vitals:   02/01/22 1200 02/01/22 2015 02/02/22 0005 02/02/22 0444  BP: (!) 160/90 (!) 162/84 (!) 164/85 (!) 167/85  Pulse: 86 87 90 84  Resp:  16 16 18   Temp: 98.2 F (36.8 C) 98.1 F (36.7 C)  98.2 F (36.8 C)  TempSrc: Oral Oral  Oral  SpO2: 100% 100% 96% 93%  Weight: 72.6 kg     Height: 5\' 11"  (1.803 m)       Intake/Output Summary (Last 24 hours) at 02/02/2022 1407 Last data filed at 02/01/2022 2027 Gross per 24 hour  Intake --  Output 700 ml  Net -700 ml   Filed Weights   02/01/22 1200  Weight: 72.6 kg   Examination: Physical Exam:  Constitutional: WN/WD African-American male currently no acute distress Respiratory: Diminished to auscultation bilaterally with coarse breath sounds, no wheezing, rales, rhonchi or crackles. Normal respiratory effort and patient is not tachypenic. No accessory muscle use.  Unlabored breathing Cardiovascular: RRR, no murmurs / rubs / gallops. S1 and S2 auscultated.  Abdomen: Soft, non-tender, non-distended. Bowel sounds positive.  GU: Deferred. Musculoskeletal: No clubbing / cyanosis of digits/nails.  Has some tenderness on palpation of his left ankle Skin: No rashes, lesions, limited skin evaluation and skin is warm and dry. Neurologic: CN 2-12 grossly intact with no focal deficits. Romberg sign and cerebellar reflexes not assessed.  Psychiatric: Normal judgment and insight. Alert and oriented x 3.  Normal mood and appropriate affect.   Data Reviewed: I have personally reviewed following labs and imaging studies  CBC: Recent Labs  Lab 01/31/22 1349 02/01/22 0520 02/02/22 0844  WBC 10.7* 7.6 9.5  NEUTROABS 7.3  --  6.8  HGB 10.1* 10.6* 10.3*  HCT 32.3* 32.9* 32.0*  MCV 79.6* 78.5* 79.2*  PLT 464* 472* 414*   Basic Metabolic Panel: Recent Labs  Lab 01/31/22 1349 02/01/22 0520 02/02/22 0844  NA 132* 138 131*  K 3.8 4.4 4.1  CL 97* 101 99  CO2 20* 21* 22  GLUCOSE 92 100* 98  BUN 5* 10 8  CREATININE 0.86 1.11 0.99  CALCIUM 9.1 9.1 9.1  MG  --   --  1.8  PHOS  --   --  4.9*   GFR: Estimated Creatinine Clearance: 84.5 mL/min (by C-G formula based on SCr of 0.99 mg/dL). Liver Function Tests: Recent Labs  Lab 01/31/22 1349 02/02/22 0844  AST 18 15  ALT 15 13  ALKPHOS 99 83  BILITOT 0.4 0.4  PROT 8.9* 8.5*  ALBUMIN 2.8* 2.4*   No results for input(s): "LIPASE", "AMYLASE" in the last 168 hours. No results for input(s): "AMMONIA" in the last 168 hours. Coagulation Profile: No results for input(s): "INR", "PROTIME" in the last 168 hours. Cardiac Enzymes: No results for input(s): "CKTOTAL", "  CKMB", "CKMBINDEX", "TROPONINI" in the last 168 hours. BNP (last 3 results) No results for input(s): "PROBNP" in the last 8760 hours. HbA1C: No results for input(s): "HGBA1C" in the last 72 hours. CBG: No results for input(s): "GLUCAP" in the last 168 hours. Lipid Profile: No results for input(s): "CHOL", "HDL", "LDLCALC", "TRIG", "CHOLHDL", "LDLDIRECT" in the last 72 hours. Thyroid Function Tests: No results for input(s): "TSH", "T4TOTAL", "FREET4", "T3FREE", "THYROIDAB" in the last 72 hours. Anemia Panel: No results for input(s): "VITAMINB12", "FOLATE", "FERRITIN", "TIBC", "IRON", "RETICCTPCT" in the last 72 hours. Sepsis Labs: Recent Labs  Lab 01/31/22 1349  LATICACIDVEN 1.3    Recent Results (from the past 240 hour(s))  Culture, blood (routine x 2)     Status:  None (Preliminary result)   Collection Time: 01/31/22  1:47 PM   Specimen: BLOOD  Result Value Ref Range Status   Specimen Description BLOOD RIGHT ARM  Final   Special Requests   Final    BOTTLES DRAWN AEROBIC AND ANAEROBIC Blood Culture results may not be optimal due to an inadequate volume of blood received in culture bottles   Culture   Final    NO GROWTH 2 DAYS Performed at [facility], [address] [place], [place] [address]    Report Status PENDING  Incomplete  Culture, blood (routine x 2)     Status: None (Preliminary result)   Collection Time: 01/31/22  5:53 PM   Specimen: BLOOD  Result Value Ref Range Status   Specimen Description BLOOD LEFT ANTECUBITAL  Final   Special Requests   Final    BOTTLES DRAWN AEROBIC AND ANAEROBIC Blood Culture results may not be optimal due to an inadequate volume of blood received in culture bottles   Culture   Final    NO GROWTH 2 DAYS Performed at [facility], [address], [place], [place] [address]    Report Status PENDING  Incomplete  Surgical PCR screen     Status: None   Collection Time: 02/01/22  2:01 PM   Specimen: Nasal Mucosa; Nasal Swab  Result Value Ref Range Status   MRSA, PCR NEGATIVE NEGATIVE Final   Staphylococcus aureus NEGATIVE NEGATIVE Final    Comment: (NOTE) The Xpert SA Assay (FDA approved for NASAL specimens in patients 102 years of age and older), is one component of a comprehensive surveillance program. It is not intended to diagnose infection nor to guide or monitor treatment. Performed at [facility], [address], [place], [place] [address]     Radiology Studies: No results found.  Scheduled Meds:  amLODipine  5 mg Oral Daily   chlorthalidone  25 mg Oral Daily   gabapentin  300 mg Oral QHS   heparin  5,000 Units Subcutaneous Q8H   losartan  25 mg Oral Daily   mupirocin ointment  1 Application Nasal BID   Continuous Infusions:   LOS: 2 days   Marguerita Merles, DO [facility] Available via Epic secure chat 7am-7pm After these hours, please refer to coverage provider listed on [email or web address] 02/02/2022, 2:07 PM

## 2022-02-02 NOTE — Anesthesia Preprocedure Evaluation (Addendum)
Anesthesia Evaluation  Patient identified by MRN, date of birth, ID band Patient awake    Reviewed: Allergy & Precautions, H&P , NPO status , Patient's Chart, lab work & pertinent test results  Airway Mallampati: III  TM Distance: >3 FB Neck ROM: Full    Dental no notable dental hx. (+) Teeth Intact, Dental Advisory Given   Pulmonary Current Smoker and Patient abstained from smoking.,    Pulmonary exam normal breath sounds clear to auscultation       Cardiovascular hypertension, Pt. on medications  Rhythm:Regular Rate:Normal     Neuro/Psych negative neurological ROS  negative psych ROS   GI/Hepatic negative GI ROS, Neg liver ROS,   Endo/Other  negative endocrine ROS  Renal/GU negative Renal ROS  negative genitourinary   Musculoskeletal  (+) Arthritis , Osteoarthritis and Rheumatoid disorders,    Abdominal   Peds  Hematology  (+) Blood dyscrasia, anemia ,   Anesthesia Other Findings   Reproductive/Obstetrics negative OB ROS                             Anesthesia Physical Anesthesia Plan  ASA: 3  Anesthesia Plan: General   Post-op Pain Management: Ofirmev IV (intra-op)*   Induction: Intravenous  PONV Risk Score and Plan: 2 and Ondansetron, Dexamethasone and Midazolam  Airway Management Planned: LMA  Additional Equipment:   Intra-op Plan:   Post-operative Plan: Extubation in OR  Informed Consent: I have reviewed the patients History and Physical, chart, labs and discussed the procedure including the risks, benefits and alternatives for the proposed anesthesia with the patient or authorized representative who has indicated his/her understanding and acceptance.     Dental advisory given  Plan Discussed with: CRNA  Anesthesia Plan Comments:         Anesthesia Quick Evaluation

## 2022-02-02 NOTE — Assessment & Plan Note (Addendum)
-   Patient's platelet count went from 464 -> 472 -> 414 -> 416 -> 394 -Likely reactive in the setting of above -Continue to monitor and trend and repeat CBC in a.m.

## 2022-02-02 NOTE — Interval H&P Note (Signed)
History and Physical Interval Note:  02/02/2022 3:10 PM  Dennis Harris  has presented today for surgery, with the diagnosis of INFECTION SEPSIS ARTHRITIS.  The various methods of treatment have been discussed with the patient and family. After consideration of risks, benefits and other options for treatment, the patient has consented to  Procedure(s): IRRIGATION AND DEBRIDEMENT LEFT FOOT/ANKLE (Left) as a surgical intervention.  The patient's history has been reviewed, patient examined, no change in status, stable for surgery.  I have reviewed the patient's chart and labs.  Questions were answered to the patient's satisfaction.     Candelaria Stagers

## 2022-02-02 NOTE — Assessment & Plan Note (Addendum)
-   His hemoglobin/hematocrit went from 10.1/32.3 -> 10.6/22.9 -> 10.3/32.0 -> 10.7/33.7 and is now 10.0/31.7 yesterday and today is 9.0/20 7.7 with an MCV of 78.0 -Checked anemia panel and showed an iron level of 32, U IBC of 263, TIBC of 295, saturation ratios of 11%, ferritin level 306, folate level of 8.3, vitamin B12 of 339 -Continue to monitor for signs and symptoms bleeding; no overt bleeding noted -Repeat CBC within 1 week

## 2022-02-02 NOTE — Op Note (Signed)
Surgeon: Surgeon(s): Candelaria Stagers, DPM  Assistants: None Pre-operative diagnosis: INFECTION SEPSIS ARTHRITIS  Post-operative diagnosis: same Procedure: Procedure(s) (LRB): IRRIGATION AND DEBRIDEMENT LEFT FOOT/ANKLE (Left) With biopsy Pathology:  ID Type Source Tests Collected by Time Destination  1 : Talus Tissue PATH Bone biopsy SURGICAL PATHOLOGY Candelaria Stagers, DPM 02/02/2022 1645   2 : Calcaneus Tissue PATH Bone biopsy SURGICAL PATHOLOGY Candelaria Stagers, DPM 02/02/2022 1651   A : Talus Tissue PATH Bone biopsy FUNGUS CULTURE WITH STAIN, AEROBIC/ANAEROBIC CULTURE W GRAM STAIN (SURGICAL/DEEP WOUND), ACID FAST CULTURE WITH REFLEXED SENSITIVITIES (MYCOBACTERIA), ACID FAST SMEAR (AFB, MYCOBACTERIA) Candelaria Stagers, DPM 02/02/2022 1645   B : Calcaneus Tissue PATH Bone biopsy FUNGUS CULTURE WITH STAIN, AEROBIC/ANAEROBIC CULTURE W GRAM STAIN (SURGICAL/DEEP WOUND), ACID FAST CULTURE WITH REFLEXED SENSITIVITIES (MYCOBACTERIA), ACID FAST SMEAR (AFB, MYCOBACTERIA) Candelaria Stagers, DPM 02/02/2022 1655     Pertinent Intra-op findings: No purulent drainage noted.  No signs of septic arthritis noted.  Normal yellow fluid was drained from the talonavicular joint as well as subtalar joint.  Cultures were obtained Anesthesia: Choice  Hemostasis: * Missing tourniquet times found for documented tourniquets in log: [id number] * EBL: Minimal Materials: 3-0 Monocryl skin staples Injectables: 30 cc of half percent Marcaine plain Complications: None  Indications for surgery: A 56 y.o. male presents with left ankle septic arthritis. Patient has failed all conservative therapy including but not limited to outpatient care antibiotics. He wishes to have surgical correction of the foot/deformity. It was determined that patient would benefit from left ankle incision and drainage washout with biopsies. Informed surgical risk consent was reviewed and read aloud to the patient.  I reviewed the films.  I have discussed my findings  with the patient in great detail.  I have discussed all risks including but not limited to infection, stiffness, scarring, limp, disability, deformity, damage to blood vessels and nerves, numbness, poor healing, need for braces, arthritis, chronic pain, amputation, death.  All benefits and realistic expectations discussed in great detail.  I have made no promises as to the outcome.  I have provided realistic expectations.  I have offered the patient a 2nd opinion, which they have declined and assured me they preferred to proceed despite the risks   Procedure in detail: The patient was both verbally and visually identified by myself, the nursing staff, and anesthesia staff in the preoperative holding area. They were then transferred to the operating room and placed on the operative table in supine position.  Attention was directed to the left TN joint as well as left subtalar joint, using #15 blade incision was made carried down to epidermal dermal junction down to the talonavicular joint.  At this time the talonavicular joint was manually opened and decompressed.  It is important note that yellow inflammatory fluid was noted.  No frank pus or purulent drainage was noted.  Same findings were for the subtalar joint as well.  Cultures were obtained prelavage.  At this time the joints were thoroughly irrigated with normal saline solution 3 L.  Post lavage cultures and bone biopsies were obtained in standard technique and sent to pathology stat for antibiotic tailoring and management.  The wound was determined to be primarily closed as there was no clear signs of infection likely inflammatory.  The wound was closed 3-0 Monocryl skin staples and wrapped with Xeroform Kerlix Ace 4 x 4 gauze.   Disposition Patient will benefit from infectious disease consult for antibiotic management.  No dressing change until follow-up  patient is okay to be discharged from our standpoint however we will continue to follow.  At  the conclusion of the procedure the patient was awoken from anesthesia and found to have tolerated the procedure well any complications. There were transferred to PACU with vital signs stable and vascular status intact.  Nicholes Rough, DPM

## 2022-02-02 NOTE — Progress Notes (Signed)
Mobility Specialist Progress Note   02/02/22 1216  Mobility  Activity Ambulated independently in hallway  Level of Assistance Independent after set-up  Assistive Device None  Distance Ambulated (ft) 446 ft  Activity Response Tolerated well  $Mobility charge 1 Mobility   Received pt in bed having c/o L ankle pain 9/10 but agreeable to mobility. Other than the pain, pt was asymptomatic throughout ambulation w/ an antalgic like gait to compensate weight beard on L side. Returned to room w/o fault and left in bed w/ call bell in reach and all needs met.  Holland Falling Mobility Specialist MS [facility] #:  [phone] Acute Rehab Office:  [phone]

## 2022-02-03 ENCOUNTER — Telehealth: Payer: Self-pay | Admitting: Podiatry

## 2022-02-03 ENCOUNTER — Encounter (HOSPITAL_COMMUNITY): Payer: Self-pay | Admitting: Podiatry

## 2022-02-03 DIAGNOSIS — M00872 Arthritis due to other bacteria, left ankle and foot: Secondary | ICD-10-CM | POA: Diagnosis not present

## 2022-02-03 DIAGNOSIS — E871 Hypo-osmolality and hyponatremia: Secondary | ICD-10-CM | POA: Diagnosis not present

## 2022-02-03 DIAGNOSIS — M009 Pyogenic arthritis, unspecified: Secondary | ICD-10-CM | POA: Diagnosis not present

## 2022-02-03 DIAGNOSIS — I10 Essential (primary) hypertension: Secondary | ICD-10-CM | POA: Diagnosis not present

## 2022-02-03 DIAGNOSIS — E782 Mixed hyperlipidemia: Secondary | ICD-10-CM | POA: Diagnosis not present

## 2022-02-03 LAB — COMPREHENSIVE METABOLIC PANEL
ALT: 13 U/L (ref 0–44)
AST: 16 U/L (ref 15–41)
Albumin: 2.7 g/dL — ABNORMAL LOW (ref 3.5–5.0)
Alkaline Phosphatase: 90 U/L (ref 38–126)
Anion gap: 10 (ref 5–15)
BUN: 13 mg/dL (ref 6–20)
CO2: 25 mmol/L (ref 22–32)
Calcium: 9.3 mg/dL (ref 8.9–10.3)
Chloride: 96 mmol/L — ABNORMAL LOW (ref 98–111)
Creatinine, Ser: 1.16 mg/dL (ref 0.61–1.24)
GFR, Estimated: >60 mL/min (ref >60)
Glucose, Bld: 102 mg/dL — ABNORMAL HIGH (ref 70–99)
Potassium: 3.8 mmol/L (ref 3.5–5.1)
Sodium: 131 mmol/L — ABNORMAL LOW (ref 135–145)
Total Bilirubin: 0.4 mg/dL (ref 0.3–1.2)
Total Protein: 9.3 g/dL — ABNORMAL HIGH (ref 6.5–8.1)

## 2022-02-03 LAB — CBC WITH DIFFERENTIAL/PLATELET
Abs Immature Granulocytes: 0.03 10*3/uL (ref 0.00–0.07)
Basophils Absolute: 0 10*3/uL (ref 0.0–0.1)
Basophils Relative: 0 %
Eosinophils Absolute: 0.1 10*3/uL (ref 0.0–0.5)
Eosinophils Relative: 1 %
HCT: 33.7 % — ABNORMAL LOW (ref 39.0–52.0)
Hemoglobin: 10.7 g/dL — ABNORMAL LOW (ref 13.0–17.0)
Immature Granulocytes: 0 %
Lymphocytes Relative: 22 %
Lymphs Abs: 2.1 10*3/uL (ref 0.7–4.0)
MCH: 25.4 pg — ABNORMAL LOW (ref 26.0–34.0)
MCHC: 31.8 g/dL (ref 30.0–36.0)
MCV: 79.9 fL — ABNORMAL LOW (ref 80.0–100.0)
Monocytes Absolute: 0.7 10*3/uL (ref 0.1–1.0)
Monocytes Relative: 8 %
Neutro Abs: 6.4 10*3/uL (ref 1.7–7.7)
Neutrophils Relative %: 69 %
Platelets: 416 10*3/uL — ABNORMAL HIGH (ref 150–400)
RBC: 4.22 MIL/uL (ref 4.22–5.81)
RDW: 13.5 % (ref 11.5–15.5)
WBC: 9.4 10*3/uL (ref 4.0–10.5)
nRBC: 0 % (ref 0.0–0.2)

## 2022-02-03 LAB — PHOSPHORUS: Phosphorus: 4.1 mg/dL (ref 2.5–4.6)

## 2022-02-03 LAB — MAGNESIUM: Magnesium: 2.1 mg/dL (ref 1.7–2.4)

## 2022-02-03 MED ORDER — DIPHENHYDRAMINE HCL 50 MG/ML IJ SOLN
12.5000 mg | Freq: Three times a day (TID) | INTRAMUSCULAR | Status: DC | PRN
  Administered 2022-02-03: 12.5 mg via INTRAVENOUS
  Filled 2022-02-03: qty 1

## 2022-02-03 MED ORDER — MINOCYCLINE HCL 50 MG PO CAPS
200.0000 mg | ORAL_CAPSULE | Freq: Two times a day (BID) | ORAL | Status: DC
  Administered 2022-02-03 – 2022-02-05 (×4): 200 mg via ORAL
  Filled 2022-02-03: qty 4
  Filled 2022-02-03: qty 2
  Filled 2022-02-03 (×2): qty 4
  Filled 2022-02-03: qty 2
  Filled 2022-02-03 (×2): qty 4
  Filled 2022-02-03: qty 2

## 2022-02-03 MED ORDER — SULFAMETHOXAZOLE-TRIMETHOPRIM 400-80 MG/5ML IV SOLN
12.0000 mg/kg/d | Freq: Three times a day (TID) | INTRAVENOUS | Status: DC
  Administered 2022-02-03 – 2022-02-04 (×3): 290.4 mg via INTRAVENOUS
  Filled 2022-02-03 (×5): qty 18.15

## 2022-02-03 NOTE — Telephone Encounter (Signed)
Sent a message to [facility] as they should have been scanned into the chart. They were in my folder on 6/27. Hopfully we can find and get them for them

## 2022-02-03 NOTE — Telephone Encounter (Signed)
We have the molecular pathogen report, is that it?

## 2022-02-03 NOTE — Telephone Encounter (Signed)
Yes that should be it thanks. If we can also make sure it is scanned into media tab as well the Dr. Was asking for that.

## 2022-02-03 NOTE — Telephone Encounter (Signed)
Ok, thanks.

## 2022-02-03 NOTE — Progress Notes (Signed)
PROGRESS NOTE    KAIDYNN SPIEKER  [id number] DOB: [birth year] DOA: 01/31/2022 PCP: Pcp, No   Brief Narrative:  LEEAM SANDAU is a 56 y.o. 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  Patient started having left ankle pain for about a month and has been followed by podiatry.  He underwent aspiration of the ankle joint on 01/12/2021 which grew out stenotrophomonas maltophilia and was treated with doxycycline.  MRI on 01/23/2022 showed septic arthritis and osteomyelitis.  Podiatry recommended hospitalization but he only showed up to the hospital on 01/31/2022.  He continues have significant pain and diminished range of motion.  He currently has no open wound and he is admitted for inpatient surgery currently hemodynamically stable.  Podiatry is planning on taking patient for an incision and drainage with washout with bone biopsy and they are planning on opening of the subtalar joint as well as the talonavicular joint.    Currently still holding off antibiotics for better culture yield and infectious diseases has been consulted for further evaluation recommendations after his podiatric intervention.  After further discussion with the podiatrist the podiatrist feels there is no signs of septic arthritis will need to come to the OR but he did have some inflammatory fluid that was decompressed and drained and cultures were obtained.  Podiatry recommending consulting infectious diseases for antibiotic management and they felt the patient could be discharged under standpoint once cultures came back.  Currently awaiting ID evaluation and per podiatry no dressing changes until the patient is following up in the outpatient setting.    Assessment and Plan: * Septic arthritis of ankle ([place]) -Ongoing issue for about 1 month.   -MRI left ankle 7/2 showed  changes consistent with septic arthritis and osteomyelitis.   -Podiatry planning for I&D with washout and bone biopsy 7/12 and the patient is status post irrigation and debridement of left foot and ankle with biopsy and per my discussion with the podiatrist did not feel that patient had septic arthritis but they did feel that the patient had some inflammatory fluid that was decompressed and drained and cultures were obtained -CRP is elevated 8.4 -Given chronicity of infection without signs of systemic illness will hold off on initiating IV antibiotics for now but low threshold to hold for any signs of developing sepsis.  Per podiatry documentation ankle aspiration performed 6/21 grew Stenotrophomonas maltophilia. -VTE Prophylaxis with Heparin 5,000 units sq q8h -Podiatry planning for washout on 7/12; anticipate collection of wound cultures and bone biopsy; per podiatry they may do a staged surgery and they are planning to keep it open and take him back to the OR on Friday versus Saturday based on his culture results -Hold IV antibiotics for now, start vancomycin and ceftriaxone if any signs of developing sepsis -Continue analgesics as needed IV morphine 1 mg every 4 as needed severe pain as well as p.o. oxycodone 5 to 10 mg every 4 as needed moderate pain -Bowel regimen with senna docusate 1 tab p.o. nightly as needed for mild constipation -Consulted infectious diseases after operative procedure and will follow-up on cultures and have them adjust and recommend antibiotics -Patient is now back on diet and podiatry recommending no dressing changes until he follows up in outpatient setting -We will defer weightbearing recommendations to the podiatrist  Hypertension -Continue home Amlodipine 5 mg po Daily , Chlorthalidone 25 mg po Daily and Losartan  25 mg po Daily -Continue to Monitor BP per Protocol -Last BP reading was a little elevated at 157/75  Hyponatremia -Patient's sodium went from 132 -> 138  -> 131 on last check with repeat pending  -May need to hold chlorthalidone if continues to drop -Repeat and trend sodium level in the a.m.  Thrombocytosis - Patient's platelet count went from 464 -> 472 -> 414 with repeat pending  -Likely reactive in the setting of above -Continue to monitor and trend and repeat CBC in a.m.  Microcytic anemia - His hemoglobin/hematocrit went from 10.1/32.3 and trended up to 10.6/22.9 yesterday and is now 10.3/32.0 with an MCV of 79.2 and repeat is pending today  -Check anemia panel in the a.m. -Continue to monitor for signs and symptoms bleeding; no overt bleeding noted -Repeat CBC in a.m.   Hyperlipidemia -Patient not currently taking atorvastatin per med reconciliation.  Rheumatoid arthritis (HCC) -Continue to Hold methotrexate for now    DVT prophylaxis: heparin injection 5,000 Units Start: 01/31/22 2230    Code Status: Full Code Family Communication: No family currently at bedside  Disposition Plan:  Level of care: Med-Surg Status is: Inpatient Remains inpatient appropriate because: Pending further clinical improvement and clearance by podiatry in ID for discharge antibiotic recommendations   Consultants:  Podiatry Infectious diseases  Procedures:  IRRIGATION AND DEBRIDEMENT LEFT FOOT/ANKLE (Left) With biopsy  Antimicrobials:  Anti-infectives (From admission, onward)    Start     Dose/Rate Route Frequency Ordered Stop   02/02/22 1602  ceFAZolin (ANCEF) 2-4 GM/100ML-% IVPB       Note to Pharmacy: [facility] [place] L: cabinet override      02/02/22 1602 02/03/22 0414       Subjective: Seen and examined at bedside and he is ambulating in the room with a walker and albuterol.  He states that he continues to have some pain in his foot.  No nausea or vomiting.  Denies any chest pain or shortness of breath.  No other concerns or complaints at this time.  Objective: Vitals:   02/02/22 1745 02/02/22 2032 02/03/22 0442 02/03/22 0857   BP: 120/73 (!) 150/83 (!) 175/94 (!) 157/75  Pulse: 91 90 84 90  Resp: 12 16 18 18   Temp:  98.5 F (36.9 C)  98.1 F (36.7 C)  TempSrc:  Oral  Oral  SpO2: 95% 97% 100% 100%  Weight:      Height:        Intake/Output Summary (Last 24 hours) at 02/03/2022 1249 Last data filed at 02/02/2022 2000 Gross per 24 hour  Intake 620 ml  Output --  Net 620 ml   Filed Weights   02/01/22 1200  Weight: 72.6 kg   Examination: Physical Exam:  Constitutional: WN/WD thin African-American male currently no acute distress Respiratory: Diminished to auscultation bilaterally with coarse breath sounds, no wheezing, rales, rhonchi or crackles. Normal respiratory effort and patient is not tachypenic. No accessory muscle use.  Cardiovascular: RRR, no murmurs / rubs / gallops. S1 and S2 auscultated. No extremity edema.  Abdomen: Soft, non-tender, non-distended. Bowel sounds positive.  GU: Deferred. Musculoskeletal: No clubbing / cyanosis of digits/nails. No joint deformity upper and lower extremities but does have a boot on his left foot Skin: No rashes, lesions, ulcers on limited skin evaluation. No induration; Warm and dry.  Neurologic: CN 2-12 grossly intact with no focal deficits. Romberg sign and cerebellar reflexes not assessed.  Psychiatric: Normal judgment and insight. Alert and oriented x 3. Normal mood and  appropriate affect.   Data Reviewed: I have personally reviewed following labs and imaging studies  CBC: Recent Labs  Lab 01/31/22 1349 02/01/22 0520 02/02/22 0844  WBC 10.7* 7.6 9.5  NEUTROABS 7.3  --  6.8  HGB 10.1* 10.6* 10.3*  HCT 32.3* 32.9* 32.0*  MCV 79.6* 78.5* 79.2*  PLT 464* 472* [id number]*   Basic Metabolic Panel: Recent Labs  Lab 01/31/22 1349 02/01/22 0520 02/02/22 0844  NA 132* 138 131*  K 3.8 4.4 4.1  CL 97* 101 99  CO2 20* 21* 22  GLUCOSE 92 100* 98  BUN 5* 10 8  CREATININE 0.86 1.11 0.99  CALCIUM 9.1 9.1 9.1  MG  --   --  1.8  PHOS  --   --  4.9*    GFR: Estimated Creatinine Clearance: 84.5 mL/min (by C-G formula based on SCr of 0.99 mg/dL). Liver Function Tests: Recent Labs  Lab 01/31/22 1349 02/02/22 0844  AST 18 15  ALT 15 13  ALKPHOS 99 83  BILITOT 0.4 0.4  PROT 8.9* 8.5*  ALBUMIN 2.8* 2.4*   No results for input(s): "LIPASE", "AMYLASE" in the last 168 hours. No results for input(s): "AMMONIA" in the last 168 hours. Coagulation Profile: No results for input(s): "INR", "PROTIME" in the last 168 hours. Cardiac Enzymes: No results for input(s): "CKTOTAL", "CKMB", "CKMBINDEX", "TROPONINI" in the last 168 hours. BNP (last 3 results) No results for input(s): "PROBNP" in the last 8760 hours. HbA1C: No results for input(s): "HGBA1C" in the last 72 hours. CBG: No results for input(s): "GLUCAP" in the last 168 hours. Lipid Profile: No results for input(s): "CHOL", "HDL", "LDLCALC", "TRIG", "CHOLHDL", "LDLDIRECT" in the last 72 hours. Thyroid Function Tests: No results for input(s): "TSH", "T4TOTAL", "FREET4", "T3FREE", "THYROIDAB" in the last 72 hours. Anemia Panel: No results for input(s): "VITAMINB12", "FOLATE", "FERRITIN", "TIBC", "IRON", "RETICCTPCT" in the last 72 hours. Sepsis Labs: Recent Labs  Lab 01/31/22 1349  LATICACIDVEN 1.3    Recent Results (from the past 240 hour(s))  Culture, blood (routine x 2)     Status: None (Preliminary result)   Collection Time: 01/31/22  1:47 PM   Specimen: BLOOD  Result Value Ref Range Status   Specimen Description BLOOD RIGHT ARM  Final   Special Requests   Final    BOTTLES DRAWN AEROBIC AND ANAEROBIC Blood Culture results may not be optimal due to an inadequate volume of blood received in culture bottles   Culture   Final    NO GROWTH 3 DAYS Performed at [facility], [place] [address] [place], [place] [address]    Report Status PENDING  Incomplete  Culture, blood (routine x 2)     Status: None (Preliminary result)   Collection Time: 01/31/22  5:53 PM   Specimen:  BLOOD  Result Value Ref Range Status   Specimen Description BLOOD LEFT ANTECUBITAL  Final   Special Requests   Final    BOTTLES DRAWN AEROBIC AND ANAEROBIC Blood Culture results may not be optimal due to an inadequate volume of blood received in culture bottles   Culture   Final    NO GROWTH 3 DAYS Performed at [facility], [place] [address], [place], [place] [address]    Report Status PENDING  Incomplete  Surgical PCR screen     Status: None   Collection Time: 02/01/22  2:01 PM   Specimen: Nasal Mucosa; Nasal Swab  Result Value Ref Range Status   MRSA, PCR NEGATIVE NEGATIVE Final  Staphylococcus aureus NEGATIVE NEGATIVE Final    Comment: (NOTE) The Xpert SA Assay (FDA approved for NASAL specimens in patients 45 years of age and older), is one component of a comprehensive surveillance program. It is not intended to diagnose infection nor to guide or monitor treatment. Performed at [facility], [address] [place], [place] [address]   Aerobic/Anaerobic Culture w Gram Stain (surgical/deep wound)     Status: None (Preliminary result)   Collection Time: 02/02/22  5:00 PM   Specimen: PATH Bone biopsy; Tissue  Result Value Ref Range Status   Specimen Description BONE  Final   Special Requests TATUS  Final   Gram Stain   Final    RARE WBC PRESENT, PREDOMINANTLY MONONUCLEAR NO ORGANISMS SEEN Gram Stain Report Called to,Read Back By and Verified With: RN I. WISE [id number] @2050  FH    Culture   Final    NO GROWTH < 24 HOURS Performed at [facility], [address] [place], [place]    Report Status PENDING  Incomplete  Aerobic/Anaerobic Culture w Gram Stain (surgical/deep wound)     Status: None (Preliminary result)   Collection Time: 02/02/22  5:00 PM   Specimen: PATH Bone biopsy; Tissue  Result Value Ref Range Status   Specimen Description BONE  Final   Special Requests CALCANEUS  Final   Gram Stain   Final    NO WBC SEEN NO ORGANISMS SEEN Gram  Stain Report Called to,Read Back By and Verified With: RN I. WISE 04/05/22 @2050  FH    Culture   Final    NO GROWTH < 24 HOURS Performed at [facility], [address], [place], [address] [place]    Report Status PENDING  Incomplete  Aerobic/Anaerobic Culture w Gram Stain (surgical/deep wound)     Status: None (Preliminary result)   Collection Time: 02/02/22  5:01 PM   Specimen: PATH Other; Tissue  Result Value Ref Range Status   Specimen Description WOUND  Final   Special Requests PRELAVAGE SWAB OF TM JNT  Final   Gram Stain   Final    MODERATE WBC PRESENT,BOTH PMN AND MONONUCLEAR NO ORGANISMS SEEN Gram Stain Report Called to,Read Back By and Verified With: RN I. WISE [id number] @2050  FH    Culture   Final    NO GROWTH < 24 HOURS Performed at [facility], [address], [place], [facility] [address]    Report Status PENDING  Incomplete  Aerobic/Anaerobic Culture w Gram Stain (surgical/deep wound)     Status: None (Preliminary result)   Collection Time: 02/02/22  5:07 PM   Specimen: PATH Other; Tissue  Result Value Ref Range Status   Specimen Description WOUND  Final   Special Requests POST LAVAGE SWAB OF TM JNT  Final   Gram Stain   Final    RARE WBC PRESENT, PREDOMINANTLY PMN NO ORGANISMS SEEN Gram Stain Report Called to,Read Back By and Verified With: RN I. WISE [place] @2050  FH    Culture   Final    NO GROWTH < 24 HOURS Performed at [facility], [address], [place], [facility]    Report Status PENDING  Incomplete     Radiology Studies: No results found.  Scheduled Meds:  amLODipine  5 mg Oral Daily   chlorthalidone  25 mg Oral Daily   gabapentin  300 mg Oral QHS   heparin  5,000 Units Subcutaneous Q8H   losartan  25 mg Oral Daily   mupirocin ointment  1 Application Nasal BID   Continuous Infusions:   LOS: 3 days   Raiford Noble, DO [facility] Available via Epic secure chat 7am-7pm After these hours, please refer to coverage  provider listed on [email or web address] 02/03/2022, 12:49 PM

## 2022-02-03 NOTE — Anesthesia Postprocedure Evaluation (Signed)
Anesthesia Post Note  Patient: Dennis Harris  Procedure(s) Performed: IRRIGATION AND DEBRIDEMENT LEFT FOOT/ANKLE (Left)     Patient location during evaluation: PACU Anesthesia Type: General Level of consciousness: awake Pain management: pain level controlled Vital Signs Assessment: post-procedure vital signs reviewed and stable Respiratory status: spontaneous breathing Cardiovascular status: stable Postop Assessment: no apparent nausea or vomiting Anesthetic complications: no   No notable events documented.  Last Vitals:  Vitals:   02/03/22 0442 02/03/22 0857  BP: (!) 175/94 (!) 157/75  Pulse: 84 90  Resp: 18 18  Temp:  36.7 C  SpO2: 100% 100%    Last Pain:  Vitals:   02/03/22 1021  TempSrc:   PainSc: 8                  Raeana Blinn

## 2022-02-03 NOTE — Plan of Care (Signed)
  Problem: Pain Managment: Goal: General experience of comfort will improve Outcome: Progressing   Problem: Safety: Goal: Ability to remain free from injury will improve Outcome: Progressing   

## 2022-02-03 NOTE — Progress Notes (Signed)
  Subjective:  Patient ID: Dennis Harris, male    DOB: 12/21/1965,  MRN: [id number]  A 56 y.o. male s/p Left foot incision/drainage, washout with biopsies. He is doing well at bedside. Pain controlled. Banage clean dry intact.   Objective:   Vitals:   02/02/22 2032 02/03/22 0442  BP: (!) 150/83 (!) 175/94  Pulse: 90 84  Resp: 16 18  Temp: 98.5 F (36.9 C)   SpO2: 97% 100%   General AA&O x3. Normal mood and affect.  Vascular Dorsalis pedis and posterior tibial pulses 2/4 bilat. Brisk capillary refill to all digits. Pedal hair present.  Neurologic Epicritic sensation grossly intact.  Dermatologic No calf pain. Motor/sensory functions are intact. Dressing is clean  Orthopedic: MMT 5/5 in dorsiflexion, plantarflexion, inversion, and eversion. Normal joint ROM without pain or crepitus.    Assessment & Plan:  Patient was evaluated and treated and all questions answered.  Left TN and STJ  septic arthritis s/p  s/p Left foot incision/drainage, washout with biopsies -All questions and concerns were discussed. -Await ID for abx recommendations -Patient is okay to be discharge from podiatric standpoint. WBAT in CAM boot. No dressing change until follow up.  -F/u in clinic one week from DC -Cultures: No growth   Candelaria Stagers, DPM  Accessible via secure chat for questions or concerns.

## 2022-02-03 NOTE — Telephone Encounter (Signed)
Dennis Harris with [facility] is wanting a copy of the patients ankle aspiration test that showed growth faxed to [phone].  Please advise

## 2022-02-03 NOTE — Consult Note (Signed)
[facility]    Date of Admission:  01/31/2022   Total days of inpatient antibiotics 1        Reason for Consult: Left ankle septic arthritis   Principal Problem:   Septic arthritis of ankle (HCC) Active Problems:   Rheumatoid arthritis (HCC)   Hyperlipidemia   Hypertension   Microcytic anemia   Thrombocytosis   Hyponatremia   Assessment: 56 year old male with the history of left ankle infection admitted for washout. 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 Bronwen Betters office on 6/21 for you underwent left ankle aspiration, noted 5 cc of thick yellow fluid.  Per documentation cultures returned positive Stenotrophomonas maltophilia, prescribed Droxy.  -MRI left ankle 7/2 showed septic arthritis and osteomyelitis involving the subtalar and talonavicular joints with small adjacent abscesses, mild myositis and cellulitis. -Per op note-no purulence noted, did note yellow fluid.  Post lavage cultures and bone biopsy sent. -Pt afebrile without leukocytosis on admission -Antibiotics currently held, He has not picked up doxycyline.  Or cultures off of antibiotics. - I called Dr. Gregary Cromer office(Rheumatology) in regards to holding MTX and pred while pt is on antibiotics for left ankle  - Dr. Ralene Cork contacted per [facility] sensitivities. Recommendations:  -Plan to start TMP-SMX 8mg /kg/day, minocycline to cover for stenotrophomonas once sensitivities are avialable Will opt to treat with double coverage in the setting of significant infection of ankle.  -Follow OR Cx -I will follow up on Rheumatology in regards to holding MTX and prednisone while on antibiotics  I spent more than 85 minutes for this patient encounter including reviewing data/chart, and coordinating care and >50% direct face to  face time providing counseling/discussing diagnostics/treatment plan with patient  Microbiology:   Antibiotics: Cefazolin 7/12  Cultures: 6/21 Cx+ stenotrophamonas moltiphlia 7/12 OR Cx Aeorbic/anaerobic Cx: GS: no organisms  Fungal Cx with stian pending AFB Cx with stain pending   HPI: Dennis Harris is a 56 y.o. male rheumatoid arthritis on methotrexate and prednisone, hypertension hyperlipidemia, left ankle pain resented for evaluation of left ankle infection.  Patient is by podiatry, Dr. Ralene Cork in the past.  He underwent aspiration of left ankle on 6/21 and yellow fluid was aspirated cultures positive for stenotrophamonas maltophilia.  He was sent doxycycline.  MRI of left ankle on 7/2 showed septic arthritis/osteomyelitis involving subtalar, talonavicular joints with small adjacent abscesses and mild myositis situs cellulitis.  On 7/6 podiatrist recommended patient go to the ED for washout, patient presented on 7/10.  On arrival to the ED vitals stable, WBC 10K.  OR with podiatry Dr. Allena Katz for I&D.  Per OR note no frank pus or purulent drainage noted there was yellow inflammatory fluid.  Post lavage cultures and bone biopsies were sent. Today, patient is resting in bed denies fevers and chills.  Reports he did not pick up doxycycline.   Review of Systems: Review of Systems  All other systems reviewed and are negative.   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   Exertional shortness of breath    "at times" (11/14/2012)   Hypercholesteremia    Hypertension    Pericarditis ~ 2009   Pneumonia    Rheumatoid arthritis(714.0)     Social History   Tobacco Use  Smoking status: Every Day    Packs/day: 0.50    Years: 37.00    Total pack years: 18.50    Types: Cigarettes   Smokeless tobacco: Never  Vaping Use   Vaping Use: Never used  Substance Use Topics   Alcohol use: Yes    Alcohol/week: 14.0 standard drinks of alcohol     Types: 14 Cans of beer per week    Comment: 2-3 12 ounce cans of beer per day   Drug use: Not Currently    Types: Marijuana    Comment: 12/13/2016 "nothing in years"    Family History  Problem Relation Age of Onset   Cancer Mother    Hypertension Mother    Stroke Mother    Diabetes Mother    Cirrhosis Father    Lupus Sister    Lupus Sister    Breast cancer Sister    Scheduled Meds:  amLODipine  5 mg Oral Daily   chlorthalidone  25 mg Oral Daily   gabapentin  300 mg Oral QHS   heparin  5,000 Units Subcutaneous Q8H   losartan  25 mg Oral Daily   mupirocin ointment  1 Application Nasal BID   Continuous Infusions: PRN Meds:.acetaminophen **OR** acetaminophen, hydrALAZINE, morphine injection, ondansetron **OR** ondansetron (ZOFRAN) IV, mouth rinse, oxyCODONE, senna-docusate Allergies  Allergen Reactions   Bee Venom Anaphylaxis   Shellfish Allergy Anaphylaxis and Hives    OBJECTIVE: Blood pressure (!) 175/94, pulse 84, temperature 98.5 F (36.9 C), temperature source Oral, resp. rate 18, height 5\' 11"  (1.803 m), weight 72.6 kg, SpO2 100 %.  Physical Exam Constitutional:      General: He is not in acute distress.    Appearance: He is normal weight. He is not toxic-appearing.  HENT:     Head: Normocephalic and atraumatic.     Right Ear: External ear normal.     Left Ear: External ear normal.     Nose: No congestion or rhinorrhea.     Mouth/Throat:     Mouth: Mucous membranes are moist.     Pharynx: Oropharynx is clear.  Eyes:     Extraocular Movements: Extraocular movements intact.     Conjunctiva/sclera: Conjunctivae normal.     Pupils: Pupils are equal, round, and reactive to light.  Cardiovascular:     Rate and Rhythm: Normal rate and regular rhythm.     Heart sounds: No murmur heard.    No friction rub. No gallop.  Pulmonary:     Effort: Pulmonary effort is normal.     Breath sounds: Normal breath sounds.  Abdominal:     General: Abdomen is flat. Bowel  sounds are normal.     Palpations: Abdomen is soft.  Musculoskeletal:        General: No swelling.     Cervical back: Normal range of motion and neck supple.     Comments: Left ankle bandaged  Skin:    General: Skin is warm and dry.  Neurological:     General: No focal deficit present.     Mental Status: He is oriented to person, place, and time.  Psychiatric:        Mood and Affect: Mood normal.     Lab Results Lab Results  Component Value Date   WBC 9.5 02/02/2022   HGB 10.3 (L) 02/02/2022   HCT 32.0 (L) 02/02/2022   MCV 79.2 (L) 02/02/2022   PLT 414 (H) 02/02/2022    Lab Results  Component Value Date   CREATININE  0.99 02/02/2022   BUN 8 02/02/2022   NA 131 (L) 02/02/2022   K 4.1 02/02/2022   CL 99 02/02/2022   CO2 22 02/02/2022    Lab Results  Component Value Date   ALT 13 02/02/2022   AST 15 02/02/2022   ALKPHOS 83 02/02/2022   BILITOT 0.4 02/02/2022       Danelle Earthly, MD [facility] 02/03/2022, 7:32 AM

## 2022-02-03 NOTE — Progress Notes (Signed)
OT Cancellation Note  Patient Details Name: Dennis Harris MRN: [id number] DOB: [birth year]   Cancelled Treatment:    Reason Eval/Treat Not Completed: OT screened, no needs identified, will sign off Per PT, pt independent with ADLs/mobility with no formal OT eval needed.  Lorre Munroe 02/03/2022, 12:29 PM

## 2022-02-03 NOTE — Progress Notes (Signed)
Orthopedic Tech Progress Note Patient Details:  Dennis Harris 05/22/1966 [id number]  Ortho Devices Type of Ortho Device: CAM walker Ortho Device/Splint Location: LLE Ortho Device/Splint Interventions: Application   Post Interventions Patient Tolerated: Well Instructions Provided: Adjustment of device  Aldridge Krzyzanowski E Keiasia Christianson 02/03/2022, 10:00 AM

## 2022-02-03 NOTE — Telephone Encounter (Signed)
Will fax that to them

## 2022-02-03 NOTE — Evaluation (Signed)
Physical Therapy Evaluation and Discharge Patient Details Name: Dennis Harris MRN: [id number] DOB: 06/27/1966 Today's Date: 02/03/2022  History of Present Illness  56 y.o. male admitted 7/10 with septic Lt ankle. s/p I&D Left ankle 7/12. PMH: HLD, RA, HTN.  Clinical Impression  Patient evaluated by Physical Therapy with no further acute PT needs identified. All education has been completed and the patient has no further questions. Safely ambulating with RW in room and hallways Mod I wearing CAM boot appropriately. Navigates flight of steps safely with supervision (has a rail on stairs at home,) this was practiced and patient feels very confident with ability to continue at home. See below for any follow-up Physical Therapy or equipment needs. PT is signing off. Thank you for this referral.        Recommendations for follow up therapy are one component of a multi-disciplinary discharge planning process, led by the attending physician.  Recommendations may be updated based on patient status, additional functional criteria and insurance authorization.  Follow Up Recommendations Outpatient PT (Once cleared by podiatry - will need to be able tolerate hours on feet at work.)      Assistance Recommended at Discharge PRN  Patient can return home with the following  Assist for transportation;Help with stairs or ramp for entrance    Equipment Recommendations Rolling walker (2 wheels)  Recommendations for Other Services       Functional Status Assessment Patient has had a recent decline in their functional status and demonstrates the ability to make significant improvements in function in a reasonable and predictable amount of time.     Precautions / Restrictions Precautions Precautions: None Required Braces or Orthoses: Other Brace (CAM boot) Other Brace: wear while ambulating Restrictions Weight Bearing Restrictions: Yes LLE Weight Bearing: Weight bearing as tolerated       Mobility  Bed Mobility Overal bed mobility: Independent                  Transfers Overall transfer level: Modified independent Equipment used: Rolling walker (2 wheels)               General transfer comment: Mod I    Ambulation/Gait Ambulation/Gait assistance: Modified independent (Device/Increase time) Gait Distance (Feet): 160 Feet Assistive device: Rolling walker (2 wheels) Gait Pattern/deviations: Step-through pattern, Decreased stride length, Antalgic Gait velocity: decreased     General Gait Details: Amb well with RW, no physical assist required. Mildly antalgic, wearing CAM boot  Stairs Stairs: Yes Stairs assistance: Supervision Stair Management: One rail Left, Step to pattern, Forwards, Sideways Number of Stairs: 15 General stair comments: Navigates flight of steps safely after initially demonstrating multimodal techniques. Pt prefers using rail on Lt step-to pattern for ascent, and side step approach with bil hands on rail for descent. Cues for sequencing. supervision for safety. No LOB.  Wheelchair Mobility    Modified Rankin (Stroke Patients Only)       Balance Overall balance assessment: Mild deficits observed, not formally tested                                           Pertinent Vitals/Pain Pain Assessment Pain Assessment: 0-10 Pain Score: 8  Pain Location: left ankle Pain Descriptors / Indicators: Aching Pain Intervention(s): Monitored during session, Repositioned    Home Living Family/patient expects to be discharged to:: Private residence Living Arrangements: Spouse/significant other Available Help at  Discharge: Family;Available PRN/intermittently Type of Home: Apartment Home Access: Stairs to enter Entrance Stairs-Rails: Doctor, general practice of Steps: 13 (x2)   Home Layout: One level Home Equipment: Grab bars - tub/shower      Prior Function Prior Level of Function : Independent/Modified  Independent;Working/employed;Driving (fork lift, works in [employer])             Mobility Comments: ind worked in [employer] ADLs Comments: ind     Higher education careers adviser   Dominant Hand: Right    Extremity/Trunk Assessment   Upper Extremity Assessment Upper Extremity Assessment: Defer to OT evaluation    Lower Extremity Assessment Lower Extremity Assessment: LLE deficits/detail LLE: Unable to fully assess due to immobilization (bandaged)       Communication   Communication: No difficulties  Cognition Arousal/Alertness: Awake/alert Behavior During Therapy: WFL for tasks assessed/performed Overall Cognitive Status: Within Functional Limits for tasks assessed                                          General Comments General comments (skin integrity, edema, etc.): Reviewed cam boot use.    Exercises Other Exercises Other Exercises: Encourged gentle AROM of ankle with cam boot off.   Assessment/Plan    PT Assessment Patient does not need any further PT services  PT Problem List         PT Treatment Interventions      PT Goals (Current goals can be found in the Care Plan section)  Acute Rehab PT Goals Patient Stated Goal: go home PT Goal Formulation: All assessment and education complete, DC therapy    Frequency       Co-evaluation               AM-PAC PT "6 Clicks" Mobility  Outcome Measure Help needed turning from your back to your side while in a flat bed without using bedrails?: None Help needed moving from lying on your back to sitting on the side of a flat bed without using bedrails?: None Help needed moving to and from a bed to a chair (including a wheelchair)?: None Help needed standing up from a chair using your arms (e.g., wheelchair or bedside chair)?: None Help needed to walk in hospital room?: None Help needed climbing 3-5 steps with a railing? : A Little 6 Click Score: 23    End of Session Equipment Utilized During  Treatment: Gait belt (CAM boot) Activity Tolerance: Patient tolerated treatment well Patient left: in bed;with call bell/phone within reach   PT Visit Diagnosis: Difficulty in walking, not elsewhere classified (R26.2)    Time: 9169-4503 PT Time Calculation (min) (ACUTE ONLY): 17 min   Charges:   PT Evaluation $PT Eval Low Complexity: 1 Low          Kathlyn Sacramento, PT   Berton Mount 02/03/2022, 12:52 PM

## 2022-02-03 NOTE — Progress Notes (Signed)
OT Cancellation Note  Patient Details Name: DEVONNE LALANI MRN: [id number] DOB: 1966-04-19   Cancelled Treatment:    Reason Eval/Treat Not Completed: Other (comment) Per podiatry, pt to be WBAT in cam boot though orthotic not in pt room. Communicated with MD for order placement and ortho tech notified. Will follow up for therapy evaluations once cam boot delivered.   Lorre Munroe 02/03/2022, 9:43 AM

## 2022-02-03 NOTE — Plan of Care (Signed)

## 2022-02-04 ENCOUNTER — Ambulatory Visit: Payer: Managed Care, Other (non HMO) | Admitting: Podiatry

## 2022-02-04 DIAGNOSIS — E871 Hypo-osmolality and hyponatremia: Secondary | ICD-10-CM | POA: Diagnosis not present

## 2022-02-04 DIAGNOSIS — I10 Essential (primary) hypertension: Secondary | ICD-10-CM | POA: Diagnosis not present

## 2022-02-04 DIAGNOSIS — E8809 Other disorders of plasma-protein metabolism, not elsewhere classified: Secondary | ICD-10-CM

## 2022-02-04 DIAGNOSIS — E782 Mixed hyperlipidemia: Secondary | ICD-10-CM | POA: Diagnosis not present

## 2022-02-04 DIAGNOSIS — M009 Pyogenic arthritis, unspecified: Secondary | ICD-10-CM | POA: Diagnosis not present

## 2022-02-04 DIAGNOSIS — N179 Acute kidney failure, unspecified: Secondary | ICD-10-CM

## 2022-02-04 DIAGNOSIS — M00872 Arthritis due to other bacteria, left ankle and foot: Secondary | ICD-10-CM | POA: Diagnosis not present

## 2022-02-04 LAB — COMPREHENSIVE METABOLIC PANEL
ALT: 14 U/L (ref 0–44)
AST: 15 U/L (ref 15–41)
Albumin: 2.5 g/dL — ABNORMAL LOW (ref 3.5–5.0)
Alkaline Phosphatase: 78 U/L (ref 38–126)
Anion gap: 9 (ref 5–15)
BUN: 14 mg/dL (ref 6–20)
CO2: 22 mmol/L (ref 22–32)
Calcium: 8.7 mg/dL — ABNORMAL LOW (ref 8.9–10.3)
Chloride: 97 mmol/L — ABNORMAL LOW (ref 98–111)
Creatinine, Ser: 1.34 mg/dL — ABNORMAL HIGH (ref 0.61–1.24)
GFR, Estimated: >60 mL/min (ref >60)
Glucose, Bld: 100 mg/dL — ABNORMAL HIGH (ref 70–99)
Potassium: 4 mmol/L (ref 3.5–5.1)
Sodium: 128 mmol/L — ABNORMAL LOW (ref 135–145)
Total Bilirubin: 0.5 mg/dL (ref 0.3–1.2)
Total Protein: 8.5 g/dL — ABNORMAL HIGH (ref 6.5–8.1)

## 2022-02-04 LAB — IRON AND TIBC
Iron: 32 ug/dL — ABNORMAL LOW (ref 45–182)
Saturation Ratios: 11 % — ABNORMAL LOW (ref 17.9–39.5)
TIBC: 295 ug/dL (ref 250–450)
UIBC: 263 ug/dL

## 2022-02-04 LAB — CBC WITH DIFFERENTIAL/PLATELET
Abs Immature Granulocytes: 0.05 10*3/uL (ref 0.00–0.07)
Basophils Absolute: 0.1 10*3/uL (ref 0.0–0.1)
Basophils Relative: 1 %
Eosinophils Absolute: 0.2 10*3/uL (ref 0.0–0.5)
Eosinophils Relative: 3 %
HCT: 31.7 % — ABNORMAL LOW (ref 39.0–52.0)
Hemoglobin: 10 g/dL — ABNORMAL LOW (ref 13.0–17.0)
Immature Granulocytes: 1 %
Lymphocytes Relative: 23 %
Lymphs Abs: 1.5 10*3/uL (ref 0.7–4.0)
MCH: 25 pg — ABNORMAL LOW (ref 26.0–34.0)
MCHC: 31.5 g/dL (ref 30.0–36.0)
MCV: 79.3 fL — ABNORMAL LOW (ref 80.0–100.0)
Monocytes Absolute: 0.6 10*3/uL (ref 0.1–1.0)
Monocytes Relative: 8 %
Neutro Abs: 4.2 10*3/uL (ref 1.7–7.7)
Neutrophils Relative %: 64 %
Platelets: 394 10*3/uL (ref 150–400)
RBC: 4 MIL/uL — ABNORMAL LOW (ref 4.22–5.81)
RDW: 13.2 % (ref 11.5–15.5)
WBC: 6.6 10*3/uL (ref 4.0–10.5)
nRBC: 0.3 % — ABNORMAL HIGH (ref 0.0–0.2)

## 2022-02-04 LAB — RETICULOCYTES
Immature Retic Fract: 14.5 % (ref 2.3–15.9)
RBC.: 3.95 MIL/uL — ABNORMAL LOW (ref 4.22–5.81)
Retic Count, Absolute: 50.2 10*3/uL (ref 19.0–186.0)
Retic Ct Pct: 1.3 % (ref 0.4–3.1)

## 2022-02-04 LAB — PHOSPHORUS: Phosphorus: 4.4 mg/dL (ref 2.5–4.6)

## 2022-02-04 LAB — ACID FAST SMEAR (AFB, MYCOBACTERIA)
Acid Fast Smear: NEGATIVE
Acid Fast Smear: NEGATIVE

## 2022-02-04 LAB — FOLATE: Folate: 8.3 ng/mL (ref >5.9)

## 2022-02-04 LAB — FERRITIN: Ferritin: 306 ng/mL (ref 24–336)

## 2022-02-04 LAB — MAGNESIUM: Magnesium: 1.9 mg/dL (ref 1.7–2.4)

## 2022-02-04 LAB — SURGICAL PATHOLOGY

## 2022-02-04 LAB — VITAMIN B12: Vitamin B-12: 339 pg/mL (ref 180–914)

## 2022-02-04 MED ORDER — SODIUM CHLORIDE 0.9 % IV SOLN: INTRAVENOUS | Status: DC

## 2022-02-04 MED ORDER — SULFAMETHOXAZOLE-TRIMETHOPRIM 800-160 MG PO TABS
2.0000 | ORAL_TABLET | Freq: Three times a day (TID) | ORAL | Status: DC
  Administered 2022-02-04 – 2022-02-05 (×4): 2 via ORAL
  Filled 2022-02-04 (×4): qty 2

## 2022-02-04 NOTE — Progress Notes (Signed)
PROGRESS NOTE    Dennis Harris  KHT:[id number] DOB: 1966-04-11 DOA: 01/31/2022 PCP: Pcp, No   Brief Narrative:  Dennis Harris is a 56 y.o. 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  Patient started having left ankle pain for about a month and has been followed by podiatry.  He underwent aspiration of the ankle joint on 01/12/2021 which grew out stenotrophomonas maltophilia and was treated with doxycycline.  MRI on 01/23/2022 showed septic arthritis and osteomyelitis.  Podiatry recommended hospitalization but he only showed up to the hospital on 01/31/2022.  He continues have significant pain and diminished range of motion.  He currently has no open wound and he is admitted for inpatient surgery currently hemodynamically stable.  Podiatry is planning on taking patient for an incision and drainage with washout with bone biopsy and they are planning on opening of the subtalar joint as well as the talonavicular joint.    Currently still holding off antibiotics for better culture yield and infectious diseases has been consulted for further evaluation recommendations after his podiatric intervention.  After further discussion with the podiatrist the podiatrist feels there is no signs of septic arthritis will need to come to the OR but he did have some inflammatory fluid that was decompressed and drained and cultures were obtained.  Podiatry recommending consulting infectious diseases for antibiotic management and they felt the patient could be discharged under standpoint once cultures came back.  The infectious disease team evaluated and recommending starting IV Bactrim and minocycline to cover for Stenotrophomonas and follow the OR cultures.  They are going to call the patient's rheumatologist in regards to holding the methotrexate for now so now is on  antibiotic.  And per podiatry no dressing changes until the patient is following up in the outpatient setting.  Unfortunately patient's renal function slightly worsened in the setting of antibiotics and his home medication and his sodium dropped so we will stop his chlorthalidone and his ARB and start gentle IV fluid hydration.    Assessment and Plan: * Septic arthritis of ankle (HCC) -Ongoing issue for about 1 month.   -MRI left ankle 7/2 showed changes consistent with septic arthritis and osteomyelitis.   -Podiatry planning for I&D with washout and bone biopsy 7/12 and the patient is status post irrigation and debridement of left foot and ankle with biopsy and per my discussion with the podiatrist did not feel that patient had septic arthritis but they did feel that the patient had some inflammatory fluid that was decompressed and drained and cultures were obtained -CRP is elevated 8.4 -Given chronicity of infection without signs of systemic illness will hold off on initiating IV antibiotics for now but low threshold to hold for any signs of developing sepsis.  Per podiatry documentation ankle aspiration performed 6/21 grew Stenotrophomonas maltophilia. -VTE Prophylaxis with Heparin 5,000 units sq q8h -Podiatry planning for washout on 7/12; anticipate collection of wound cultures and bone biopsy; per podiatry they may do a staged surgery and they are planning to take him back to the OR but given lack of purulence they feel that he is stable from this perspective -Hold IV antibiotics for now, start vancomycin and ceftriaxone if any signs of developing sepsis -Continue analgesics as needed IV morphine 1 mg every 4 as needed severe pain as well as p.o. oxycodone 5 to 10 mg every 4 as needed moderate pain -  Bowel regimen with senna docusate 1 tab p.o. nightly as needed for mild constipation -Consulted infectious diseases after operative procedure and will follow-up on cultures and have them adjust and  recommend antibiotics; they have now started the patient on minocycline as well as IV Bactrim we will continue further recommendations once sensitivities were available -Patient is now back on diet and podiatry recommending no dressing changes until he follows up in outpatient setting -We will defer weightbearing recommendations to the podiatrist  Hypertension -Continue home Amlodipine 5 mg po Daily  But will hold Chlorthalidone 25 mg po Daily and Losartan 25 mg po Daily -Continue to Monitor BP per Protocol -Last BP reading was a little elevated at 124/78  Hypoalbuminemia -Patient's Albumin Level went from 2.4 -> 2.7 -> 2.5 -Continue to Monitor and Trend and repeat CMP in the AM   AKI (acute kidney injury) (HCC) - In the setting of Bactrim, chlorthalidone and his ARB -Patient's BUNs/creatinine went from 8/0.99 -> 13/1.16 -> 14/1.34 -Hold his ARB and his chlorthalidone and start gentle IV fluid hydration with normal saline at 75 mils per hour -Avoid further nephrotoxic medications, contrast dyes, hypotension and dehydration and will need to renally adjust medications -Repeat CMP in a.m. and continue to monitor and trend renal function carefully  Hyponatremia -Patient's sodium went from 132 -> 138 -> 131 x2 and dropped down to 128 or hold his chlorthalidone and start gentle IV fluid hydration -May need to hold chlorthalidone if continues to drop -Repeat and trend sodium level in the a.m.  Thrombocytosis - Patient's platelet count went from 464 -> 472 -> 414 -> 416 -> 394 -Likely reactive in the setting of above -Continue to monitor and trend and repeat CBC in a.m.  Microcytic anemia - His hemoglobin/hematocrit went from 10.1/32.3 -> 10.6/22.9 -> 10.3/32.0 -> 10.7/33.7 and is now 10.0/31.7 with an MCV of 79.3 -Checked anemia panel and showed an iron level of 32, U IBC of 263, TIBC of 295, saturation ratios of 11%, ferritin level 306, folate level of 8.3, vitamin B12 of 339 -Continue to  monitor for signs and symptoms bleeding; no overt bleeding noted -Repeat CBC in a.m.   Hyperlipidemia -Patient not currently taking atorvastatin per med reconciliation.  Rheumatoid arthritis (HCC) -Continue to Hold methotrexate for now  -Dr. Thedore Mins has contacted the patient's primary rheumatologist Dr. Dimple Casey for further evaluation and recommendations for the methotrexate and prednisone  DVT prophylaxis: heparin injection 5,000 Units Start: 01/31/22 2230    Code Status: Full Code Family Communication: No family currently at bedside  Disposition Plan:  Level of care: Med-Surg Status is: Inpatient Remains inpatient appropriate because: We will await further cultures and sensitivities to determine antibiotics for disposition and discharge and will need to ensure that his renal function improves given that his renal function is now worsened   Consultants:  Infectious Diseases Podiatry  Procedures:  IRRIGATION AND DEBRIDEMENT LEFT FOOT/ANKLE (Left) With biopsy  Antimicrobials:  Anti-infectives (From admission, onward)    Start     Dose/Rate Route Frequency Ordered Stop   02/04/22 1400  sulfamethoxazole-trimethoprim (BACTRIM DS) 800-160 MG per tablet 2 tablet        2 tablet Oral Every 8 hours 02/04/22 0957     02/03/22 1515  sulfamethoxazole-trimethoprim (BACTRIM) 290.4 mg of trimethoprim in dextrose 5 % 500 mL IVPB  Status:  Discontinued        12 mg/kg/day of trimethoprim  72.6 kg 345.4 mL/hr over 90 Minutes Intravenous Every 8 hours 02/03/22 1420 02/04/22  0957   02/03/22 1515  minocycline (MINOCIN) capsule 200 mg        200 mg Oral 2 times daily 02/03/22 1420     02/02/22 1602  ceFAZolin (ANCEF) 2-4 GM/100ML-% IVPB       Note to Pharmacy: Susy [place] L: cabinet override      02/02/22 1602 02/03/22 0414       Subjective: Seen and examined at bedside and he is sitting in chair appears calm and states that his foot was doing okay.  No nausea or vomiting.  States his pain  can get as bad as 10 out of 10 but currently is out of 4 or 5 in severity.  No chest pain or shortness of breath.  Objective: Vitals:   02/03/22 1343 02/03/22 2118 02/04/22 0406 02/04/22 0742  BP: 134/73 122/61 132/70 124/78  Pulse: 86 98 80 86  Resp: 18 20 16 16   Temp: 98.5 F (36.9 C) 99.1 F (37.3 C)  98.3 F (36.8 C)  TempSrc: Oral Oral  Oral  SpO2: 99% 98% 100% 97%  Weight:      Height:        Intake/Output Summary (Last 24 hours) at 02/04/2022 1532 Last data filed at 02/04/2022 0900 Gross per 24 hour  Intake 1156 ml  Output --  Net 1156 ml   Filed Weights   02/01/22 1200  Weight: 72.6 kg   Examination: Physical Exam:  Constitutional: WN/WD thin African-American male currently in no acute distress appears calm Respiratory: Diminished to auscultation bilaterally with coarse breath sounds, no wheezing, rales, rhonchi or crackles. Normal respiratory effort and patient is not tachypenic. No accessory muscle use.  Cardiovascular: RRR, no murmurs / rubs / gallops. S1 and S2 auscultated. No extremity edema.  Abdomen: Soft, non-tender, non-distended. Bowel sounds positive.  GU: Deferred. Musculoskeletal: No clubbing / cyanosis of digits/nails.  Right foot and ankle and is in a cam boot Skin: No rashes, lesions, ulcers on limited skin evaluation. No induration; Warm and dry.  Neurologic: CN 2-12 grossly intact with no focal deficits. Romberg sign and cerebellar reflexes not assessed.  Psychiatric: Normal judgment and insight. Alert and oriented x 3. Normal mood and appropriate affect.   Data Reviewed: I have personally reviewed following labs and imaging studies  CBC: Recent Labs  Lab 01/31/22 1349 02/01/22 0520 02/02/22 0844 02/03/22 1245 02/04/22 0242  WBC 10.7* 7.6 9.5 9.4 6.6  NEUTROABS 7.3  --  6.8 6.4 4.2  HGB 10.1* 10.6* 10.3* 10.7* 10.0*  HCT 32.3* 32.9* 32.0* 33.7* 31.7*  MCV 79.6* 78.5* 79.2* 79.9* 79.3*  PLT 464* 472* 414* 416* 394   Basic Metabolic  Panel: Recent Labs  Lab 01/31/22 1349 02/01/22 0520 02/02/22 0844 02/03/22 1245 02/04/22 0242  NA 132* 138 131* 131* 128*  K 3.8 4.4 4.1 3.8 4.0  CL 97* 101 99 96* 97*  CO2 20* 21* 22 25 22   GLUCOSE 92 100* 98 102* 100*  BUN 5* 10 8 13 14   CREATININE 0.86 1.11 0.99 1.16 1.34*  CALCIUM 9.1 9.1 9.1 9.3 8.7*  MG  --   --  1.8 2.1 1.9  PHOS  --   --  4.9* 4.1 4.4   GFR: Estimated Creatinine Clearance: 62.5 mL/min (A) (by C-G formula based on SCr of 1.34 mg/dL (H)). Liver Function Tests: Recent Labs  Lab 01/31/22 1349 02/02/22 0844 02/03/22 1245 02/04/22 0242  AST 18 15 16 15   ALT 15 13 13 14   ALKPHOS 99 83 90 78  BILITOT 0.4 0.4 0.4 0.5  PROT 8.9* 8.5* 9.3* 8.5*  ALBUMIN 2.8* 2.4* 2.7* 2.5*   No results for input(s): "LIPASE", "AMYLASE" in the last 168 hours. No results for input(s): "AMMONIA" in the last 168 hours. Coagulation Profile: No results for input(s): "INR", "PROTIME" in the last 168 hours. Cardiac Enzymes: No results for input(s): "CKTOTAL", "CKMB", "CKMBINDEX", "TROPONINI" in the last 168 hours. BNP (last 3 results) No results for input(s): "PROBNP" in the last 8760 hours. HbA1C: No results for input(s): "HGBA1C" in the last 72 hours. CBG: No results for input(s): "GLUCAP" in the last 168 hours. Lipid Profile: No results for input(s): "CHOL", "HDL", "LDLCALC", "TRIG", "CHOLHDL", "LDLDIRECT" in the last 72 hours. Thyroid Function Tests: No results for input(s): "TSH", "T4TOTAL", "FREET4", "T3FREE", "THYROIDAB" in the last 72 hours. Anemia Panel: Recent Labs    02/04/22 0242  VITAMINB12 339  FOLATE 8.3  FERRITIN 306  TIBC 295  IRON 32*  RETICCTPCT 1.3   Sepsis Labs: Recent Labs  Lab 01/31/22 1349  LATICACIDVEN 1.3    Recent Results (from the past 240 hour(s))  Culture, blood (routine x 2)     Status: None (Preliminary result)   Collection Time: 01/31/22  1:47 PM   Specimen: BLOOD  Result Value Ref Range Status   Specimen Description  BLOOD RIGHT ARM  Final   Special Requests   Final    BOTTLES DRAWN AEROBIC AND ANAEROBIC Blood Culture results may not be optimal due to an inadequate volume of blood received in culture bottles   Culture   Final    NO GROWTH 4 DAYS Performed at [facility], [address], [place], [place] [address]    Report Status PENDING  Incomplete  Culture, blood (routine x 2)     Status: None (Preliminary result)   Collection Time: 01/31/22  5:53 PM   Specimen: BLOOD  Result Value Ref Range Status   Specimen Description BLOOD LEFT ANTECUBITAL  Final   Special Requests   Final    BOTTLES DRAWN AEROBIC AND ANAEROBIC Blood Culture results may not be optimal due to an inadequate volume of blood received in culture bottles   Culture   Final    NO GROWTH 4 DAYS Performed at [facility], [address] [place], [place] [address]    Report Status PENDING  Incomplete  Surgical PCR screen     Status: None   Collection Time: 02/01/22  2:01 PM   Specimen: Nasal Mucosa; Nasal Swab  Result Value Ref Range Status   MRSA, PCR NEGATIVE NEGATIVE Final   Staphylococcus aureus NEGATIVE NEGATIVE Final    Comment: (NOTE) The Xpert SA Assay (FDA approved for NASAL specimens in patients 70 years of age and older), is one component of a comprehensive surveillance program. It is not intended to diagnose infection nor to guide or monitor treatment. Performed at [facility], [address], [place], [place] [address]   Aerobic/Anaerobic Culture w Gram Stain (surgical/deep wound)     Status: None (Preliminary result)   Collection Time: 02/02/22  5:00 PM   Specimen: PATH Bone biopsy; Tissue  Result Value Ref Range Status   Specimen Description BONE  Final   Special Requests TATUS  Final   Gram Stain   Final    RARE WBC PRESENT, PREDOMINANTLY MONONUCLEAR NO ORGANISMS SEEN Gram Stain Report Called to,Read Back By and Verified With: RN I. WISE [phone]  FH    Culture   Final  NO GROWTH  2 DAYS Performed at [facility], [address], [place], [place] [address]    Report Status PENDING  Incomplete  Aerobic/Anaerobic Culture w Gram Stain (surgical/deep wound)     Status: None (Preliminary result)   Collection Time: 02/02/22  5:00 PM   Specimen: PATH Bone biopsy; Tissue  Result Value Ref Range Status   Specimen Description BONE  Final   Special Requests CALCANEUS  Final   Gram Stain   Final    NO WBC SEEN NO ORGANISMS SEEN Gram Stain Report Called to,Read Back By and Verified With: RN I. WISE [phone]  FH    Culture   Final    NO GROWTH 2 DAYS Performed at [facility], [address] [place], [place] [address]    Report Status PENDING  Incomplete  Acid Fast Smear (AFB)     Status: None   Collection Time: 02/02/22  5:00 PM   Specimen: PATH Bone biopsy; Tissue  Result Value Ref Range Status   AFB Specimen Processing Comment  Final    Comment: Tissue Grinding and Digestion/Decontamination   Acid Fast Smear Negative  Final    Comment: (NOTE) Performed At: [facility] [address] [place], [place] [id number] Jolene Schimke MD ZH:[id number]    Source (AFB) BONE  Final    Comment: TATUS Performed at [facility], [address] [place], [place] [address]   Acid Fast Smear (AFB)     Status: None   Collection Time: 02/02/22  5:00 PM   Specimen: PATH Bone biopsy; Tissue  Result Value Ref Range Status   AFB Specimen Processing Comment  Final    Comment: Tissue Grinding and Digestion/Decontamination   Acid Fast Smear Negative  Final    Comment: (NOTE) Performed At: [facility] [address] [place], [place] [id number] Jolene Schimke MD UV:[id number]    Source (AFB) BONE  Final    Comment: CALCANEUS Performed at [facility], [address] [place], [place] [address]   Aerobic/Anaerobic Culture w Gram Stain (surgical/deep wound)     Status: None (Preliminary result)   Collection Time: 02/02/22  5:01 PM    Specimen: PATH Other; Tissue  Result Value Ref Range Status   Specimen Description WOUND  Final   Special Requests PRELAVAGE SWAB OF TM JNT  Final   Gram Stain   Final    MODERATE WBC PRESENT,BOTH PMN AND MONONUCLEAR NO ORGANISMS SEEN Gram Stain Report Called to,Read Back By and Verified With: RN I. WISE [phone]  FH    Culture   Final    NO GROWTH 2 DAYS Performed at [facility], [address], [place], [place] [address]    Report Status PENDING  Incomplete  Aerobic/Anaerobic Culture w Gram Stain (surgical/deep wound)     Status: None (Preliminary result)   Collection Time: 02/02/22  5:07 PM   Specimen: PATH Other; Tissue  Result Value Ref Range Status   Specimen Description WOUND  Final   Special Requests POST LAVAGE SWAB OF TM JNT  Final   Gram Stain   Final    RARE WBC PRESENT, PREDOMINANTLY PMN NO ORGANISMS SEEN Gram Stain Report Called to,Read Back By and Verified With: RN I. WISE [phone]  FH    Culture   Final    NO GROWTH 2 DAYS Performed at [facility], [address], [place], [place] [address]    Report Status PENDING  Incomplete  Radiology Studies: No results found.   Scheduled Meds:  amLODipine  5 mg Oral Daily   gabapentin  300 mg Oral QHS   heparin  5,000 Units Subcutaneous Q8H   minocycline  200 mg Oral BID   mupirocin ointment  1 Application Nasal BID   sulfamethoxazole-trimethoprim  2 tablet Oral Q8H   Continuous Infusions:  sodium chloride 75 mL/hr at 02/04/22 1110    LOS: 4 days   Marguerita Merles, DO [facility] Available via Epic secure chat 7am-7pm After these hours, please refer to coverage provider listed on [email or web address] 02/04/2022, 3:32 PM

## 2022-02-04 NOTE — Plan of Care (Signed)
  Problem: Clinical Measurements: Goal: Ability to maintain clinical measurements within normal limits will improve Outcome: Progressing Goal: Will remain free from infection Outcome: Progressing Goal: Diagnostic test results will improve Outcome: Progressing   Problem: Activity: Goal: Risk for activity intolerance will decrease Outcome: Progressing   

## 2022-02-04 NOTE — Progress Notes (Signed)
Mobility Specialist Progress Note   02/04/22 1040  Mobility  Activity Ambulated independently in hallway  Level of Assistance Modified independent, requires aide device or extra time  Assistive Device  (IV pole)  LLE Weight Bearing WBAT  Distance Ambulated (ft) 550 ft  Activity Response Tolerated well  $Mobility charge 1 Mobility   Received pt at EOB c/o 7/10 pain in L foot but agreeable to mobility. Pt was asymptomatic throughout ambulation w/ antalgic like gait but steady. Returned to room w/o fault left at EOB w/ call bell in reach and all needs met.  Holland Falling Mobility Specialist MS [facility] #:  [phone] Acute Rehab Office:  [phone]

## 2022-02-04 NOTE — Progress Notes (Signed)
[facility]  Date of Admission:  01/31/2022   Total days of inpatient antibiotics 2  Principal Problem:   Septic arthritis of ankle (HCC) Active Problems:   Rheumatoid arthritis (HCC)   Hyperlipidemia   Hypertension   Microcytic anemia   Thrombocytosis   Hyponatremia   AKI (acute kidney injury) (HCC)   Hypoalbuminemia          Assessment: 56 year old male with the history of left ankle infection admitted for washout. 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 Bronwen Betters office on 6/21 for you underwent left ankle aspiration, noted 5 cc of thick yellow fluid.  Per documentation cultures returned positive Stenotrophomonas maltophilia, prescribed Droxy.  -MRI left ankle 7/2 showed septic arthritis and osteomyelitis involving the subtalar and talonavicular joints with small adjacent abscesses, mild myositis and cellulitis. -Per op note-no purulence noted, did note yellow fluid.  Post lavage cultures and bone biopsy sent. -Pt afebrile without leukocytosis on admission -Antibiotics currently held, He has not picked up doxycyline.  Or cultures off of antibiotics. - I communicated with Dr. Dimple Casey Rheumatology,and ok to hold mtx and pred for 6 weeks in the setting of infection - Dr. Ralene Cork contacted per [facility] sensitivities. Recommendations:  -Transitioned bactrim to PO 2DS q8h, and continue minocycline PO.  Given frequent dosing of IV bactrim, will see if pt tolerates bactrim PO while inpatient -Follow OR Cx. Aspirate Cx + steno on 6/21 do not have sensitivities.  -Hold MTX and prednisone while on antibiotics   Microbiology:   Antibiotics: Cefazolin 7/12   Cultures: 6/21 Cx+ stenotrophamonas moltiphlia 7/12 OR Cx Aeorbic/anaerobic Cx: GS: no organisms  Fungal Cx with stian  pending AFB Cx with stain pending  SUBJECTIVE: Resting in bed. No new complaints.  Interval: Afebrile. Wbc 6.6K Review of Systems: Review of Systems  All other systems reviewed and are negative.    Scheduled Meds:  amLODipine  5 mg Oral Daily   gabapentin  300 mg Oral QHS   heparin  5,000 Units Subcutaneous Q8H   minocycline  200 mg Oral BID   mupirocin ointment  1 Application Nasal BID   sulfamethoxazole-trimethoprim  2 tablet Oral Q8H   Continuous Infusions:  sodium chloride 75 mL/hr at 02/04/22 1110   PRN Meds:.acetaminophen **OR** acetaminophen, diphenhydrAMINE, hydrALAZINE, morphine injection, ondansetron **OR** ondansetron (ZOFRAN) IV, mouth rinse, oxyCODONE, senna-docusate Allergies  Allergen Reactions   Bee Venom Anaphylaxis   Shellfish Allergy Anaphylaxis and Hives    OBJECTIVE: Vitals:   02/03/22 2118 02/04/22 0406 02/04/22 0742 02/04/22 1547  BP: 122/61 132/70 124/78 134/74  Pulse: 98 80 86 (!) 101  Resp: 20 16 16    Temp: 99.1 F (37.3 C)  98.3 F (36.8 C) 98.4 F (36.9 C)  TempSrc: Oral  Oral Oral  SpO2: 98% 100% 97% 93%  Weight:      Height:       Body mass index is 22.32 kg/m.  Physical Exam Constitutional:      General: He is not in acute distress.    Appearance: He is normal weight. He is not toxic-appearing.  HENT:     Head: Normocephalic and atraumatic.     Right Ear: External ear normal.     Left Ear: External ear normal.     Nose: No congestion or rhinorrhea.  Mouth/Throat:     Mouth: Mucous membranes are moist.     Pharynx: Oropharynx is clear.  Eyes:     Extraocular Movements: Extraocular movements intact.     Conjunctiva/sclera: Conjunctivae normal.     Pupils: Pupils are equal, round, and reactive to light.  Cardiovascular:     Rate and Rhythm: Normal rate and regular rhythm.     Heart sounds: No murmur heard.    No friction rub. No gallop.  Pulmonary:     Effort: Pulmonary effort is normal.     Breath sounds: Normal  breath sounds.  Abdominal:     General: Abdomen is flat. Bowel sounds are normal.     Palpations: Abdomen is soft.  Musculoskeletal:        General: No swelling.     Cervical back: Normal range of motion and neck supple.     Comments: Left foot bandaged  Skin:    General: Skin is warm and dry.  Neurological:     General: No focal deficit present.     Mental Status: He is oriented to person, place, and time.  Psychiatric:        Mood and Affect: Mood normal.       Lab Results Lab Results  Component Value Date   WBC 6.6 02/04/2022   HGB 10.0 (L) 02/04/2022   HCT 31.7 (L) 02/04/2022   MCV 79.3 (L) 02/04/2022   PLT 394 02/04/2022    Lab Results  Component Value Date   CREATININE 1.34 (H) 02/04/2022   BUN 14 02/04/2022   NA 128 (L) 02/04/2022   K 4.0 02/04/2022   CL 97 (L) 02/04/2022   CO2 22 02/04/2022    Lab Results  Component Value Date   ALT 14 02/04/2022   AST 15 02/04/2022   ALKPHOS 78 02/04/2022   BILITOT 0.5 02/04/2022        Danelle Earthly, MD [facility] 02/04/2022, 4:39 PM

## 2022-02-04 NOTE — Assessment & Plan Note (Addendum)
-  Patient's Albumin Level went from 2.4 -> 2.7 -> 2.5 and at the time of discharge was 2.3 -Continue to Monitor and Trend and repeat CMP within 1 week

## 2022-02-04 NOTE — Plan of Care (Signed)
  Problem: Activity: Goal: Risk for activity intolerance will decrease Outcome: Progressing   Problem: Nutrition: Goal: Adequate nutrition will be maintained Outcome: Progressing   Problem: Coping: Goal: Level of anxiety will decrease Outcome: Progressing   Problem: Pain Managment: Goal: General experience of comfort will improve Outcome: Progressing   

## 2022-02-04 NOTE — Assessment & Plan Note (Addendum)
-   In the setting of Bactrim, chlorthalidone and his ARB -Patient's BUNs/creatinine went from 8/0.99 -> 13/1.16 -> 14/1.34 -Hold his ARB and his chlorthalidone and start gentle IV fluid hydration with normal saline at 75 mils per hour -Avoid further nephrotoxic medications, contrast dyes, hypotension and dehydration and will need to renally adjust medications -Repeat CMP in a.m. and continue to monitor and trend renal function carefully

## 2022-02-05 DIAGNOSIS — E8809 Other disorders of plasma-protein metabolism, not elsewhere classified: Secondary | ICD-10-CM | POA: Diagnosis not present

## 2022-02-05 DIAGNOSIS — E785 Hyperlipidemia, unspecified: Secondary | ICD-10-CM

## 2022-02-05 DIAGNOSIS — I10 Essential (primary) hypertension: Secondary | ICD-10-CM | POA: Diagnosis not present

## 2022-02-05 DIAGNOSIS — M00872 Arthritis due to other bacteria, left ankle and foot: Secondary | ICD-10-CM | POA: Diagnosis not present

## 2022-02-05 DIAGNOSIS — N179 Acute kidney failure, unspecified: Secondary | ICD-10-CM | POA: Diagnosis not present

## 2022-02-05 LAB — CBC WITH DIFFERENTIAL/PLATELET
Abs Immature Granulocytes: 0.05 10*3/uL (ref 0.00–0.07)
Basophils Absolute: 0.1 10*3/uL (ref 0.0–0.1)
Basophils Relative: 1 %
Eosinophils Absolute: 0.1 10*3/uL (ref 0.0–0.5)
Eosinophils Relative: 2 %
HCT: 27.7 % — ABNORMAL LOW (ref 39.0–52.0)
Hemoglobin: 9 g/dL — ABNORMAL LOW (ref 13.0–17.0)
Immature Granulocytes: 1 %
Lymphocytes Relative: 17 %
Lymphs Abs: 1.2 10*3/uL (ref 0.7–4.0)
MCH: 25.4 pg — ABNORMAL LOW (ref 26.0–34.0)
MCHC: 32.5 g/dL (ref 30.0–36.0)
MCV: 78 fL — ABNORMAL LOW (ref 80.0–100.0)
Monocytes Absolute: 1.1 10*3/uL — ABNORMAL HIGH (ref 0.1–1.0)
Monocytes Relative: 16 %
Neutro Abs: 4.5 10*3/uL (ref 1.7–7.7)
Neutrophils Relative %: 63 %
Platelets: 350 10*3/uL (ref 150–400)
RBC: 3.55 MIL/uL — ABNORMAL LOW (ref 4.22–5.81)
RDW: 13 % (ref 11.5–15.5)
WBC: 7.1 10*3/uL (ref 4.0–10.5)
nRBC: 0.3 % — ABNORMAL HIGH (ref 0.0–0.2)

## 2022-02-05 LAB — BASIC METABOLIC PANEL
Anion gap: 10 (ref 5–15)
BUN: 16 mg/dL (ref 6–20)
CO2: 22 mmol/L (ref 22–32)
Calcium: 9.3 mg/dL (ref 8.9–10.3)
Chloride: 99 mmol/L (ref 98–111)
Creatinine, Ser: 1.49 mg/dL — ABNORMAL HIGH (ref 0.61–1.24)
GFR, Estimated: 54 mL/min — ABNORMAL LOW (ref >60)
Glucose, Bld: 97 mg/dL (ref 70–99)
Potassium: 4 mmol/L (ref 3.5–5.1)
Sodium: 131 mmol/L — ABNORMAL LOW (ref 135–145)

## 2022-02-05 LAB — COMPREHENSIVE METABOLIC PANEL
ALT: 15 U/L (ref 0–44)
AST: 15 U/L (ref 15–41)
Albumin: 2.3 g/dL — ABNORMAL LOW (ref 3.5–5.0)
Alkaline Phosphatase: 72 U/L (ref 38–126)
Anion gap: 8 (ref 5–15)
BUN: 15 mg/dL (ref 6–20)
CO2: 20 mmol/L — ABNORMAL LOW (ref 22–32)
Calcium: 8.7 mg/dL — ABNORMAL LOW (ref 8.9–10.3)
Chloride: 102 mmol/L (ref 98–111)
Creatinine, Ser: 1.34 mg/dL — ABNORMAL HIGH (ref 0.61–1.24)
GFR, Estimated: >60 mL/min (ref >60)
Glucose, Bld: 90 mg/dL (ref 70–99)
Potassium: 4.5 mmol/L (ref 3.5–5.1)
Sodium: 130 mmol/L — ABNORMAL LOW (ref 135–145)
Total Bilirubin: 0.5 mg/dL (ref 0.3–1.2)
Total Protein: 7.8 g/dL (ref 6.5–8.1)

## 2022-02-05 LAB — CULTURE, BLOOD (ROUTINE X 2)
Culture: NO GROWTH
Culture: NO GROWTH

## 2022-02-05 LAB — PHOSPHORUS: Phosphorus: 4.4 mg/dL (ref 2.5–4.6)

## 2022-02-05 LAB — MAGNESIUM: Magnesium: 1.8 mg/dL (ref 1.7–2.4)

## 2022-02-05 MED ORDER — DIPHENHYDRAMINE HCL 12.5 MG/5ML PO ELIX
12.5000 mg | ORAL_SOLUTION | Freq: Three times a day (TID) | ORAL | Status: DC | PRN
Start: 2022-02-05 — End: 2022-02-06

## 2022-02-05 MED ORDER — AMLODIPINE BESYLATE 10 MG PO TABS: 10.0000 mg | ORAL_TABLET | Freq: Every day | ORAL | 0 refills | Status: DC

## 2022-02-05 MED ORDER — ACETAMINOPHEN 325 MG PO TABS: 650.0000 mg | ORAL_TABLET | Freq: Four times a day (QID) | ORAL | 0 refills | Status: DC | PRN

## 2022-02-05 MED ORDER — ONDANSETRON HCL 4 MG PO TABS: 4.0000 mg | ORAL_TABLET | Freq: Four times a day (QID) | ORAL | 0 refills | Status: DC | PRN

## 2022-02-05 MED ORDER — SULFAMETHOXAZOLE-TRIMETHOPRIM 800-160 MG PO TABS: 2.0000 | ORAL_TABLET | Freq: Three times a day (TID) | ORAL | 0 refills | Status: AC

## 2022-02-05 MED ORDER — OXYCODONE HCL 5 MG PO TABS: 5.0000 mg | ORAL_TABLET | Freq: Four times a day (QID) | ORAL | 0 refills | Status: AC | PRN

## 2022-02-05 MED ORDER — SENNOSIDES-DOCUSATE SODIUM 8.6-50 MG PO TABS: 1.0000 | ORAL_TABLET | Freq: Every evening | ORAL | 0 refills | Status: DC | PRN

## 2022-02-05 MED ORDER — MINOCYCLINE HCL 100 MG PO CAPS: 200.0000 mg | ORAL_CAPSULE | Freq: Two times a day (BID) | ORAL | 0 refills | Status: AC

## 2022-02-05 NOTE — Progress Notes (Signed)
[facility]  Date of Admission:  01/31/2022   Total days of inpatient antibiotics 4  Principal Problem:   Septic arthritis of ankle (HCC) Active Problems:   Rheumatoid arthritis (HCC)   Hyperlipidemia   Hypertension   Microcytic anemia   Thrombocytosis   Hyponatremia   AKI (acute kidney injury) (HCC)   Hypoalbuminemia          Assessment: 56 year old male with the history of left ankle infection admitted for washout. 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 Bronwen Betters office on 6/21 for you underwent left ankle aspiration, noted 5 cc of thick yellow fluid.  Per documentation cultures returned positive Stenotrophomonas maltophilia, prescribed Droxy.  -MRI left ankle 7/2 showed septic arthritis and osteomyelitis involving the subtalar and talonavicular joints with small adjacent abscesses, mild myositis and cellulitis. -Per op note-no purulence noted, did note yellow fluid.  Post lavage cultures and bone biopsy sent. -Pt afebrile without leukocytosis on admission -Antibiotics currently held, He has not picked up doxycyline.  Or cultures off of antibiotics. - I communicated with Dr. Dimple Casey Rheumatology,and ok to hold mtx and pred for 6 weeks in the setting of infection - Dr. Ralene Cork contacted per [facility] sensitivities. Recommendations:  -Transitioned bactrim to PO 2DS q8h, and continue minocycline PO EOT 03/01/22.  Pathology showed no osteomyelitis as such will treat with 4 weeks of antibiotics from OR for septic arthritis. -Follow OR Cx. Aspirate Cx + steno on 6/21 do not have sensitivities.  -Hold MTX and prednisone while on antibiotics. Follow-up with Rheumatology following discharge.  -Follow-up with ID on 7/24  Microbiology:   Antibiotics: Cefazolin 7/12   Cultures: 6/21 Cx+  stenotrophamonas moltiphlia 7/12 OR Cx Aeorbic/anaerobic Cx: GS: no organisms  Fungal Cx with stian pending AFB Cx with stain pending  SUBJECTIVE: Resting in bed. No new complaints.  Interval: Afebrile. Wbc 7.1K Review of Systems: Review of Systems  All other systems reviewed and are negative.    Scheduled Meds:  amLODipine  5 mg Oral Daily   gabapentin  300 mg Oral QHS   heparin  5,000 Units Subcutaneous Q8H   minocycline  200 mg Oral BID   mupirocin ointment  1 Application Nasal BID   sulfamethoxazole-trimethoprim  2 tablet Oral Q8H   Continuous Infusions:  sodium chloride 75 mL/hr at 02/05/22 1245   PRN Meds:.acetaminophen **OR** acetaminophen, diphenhydrAMINE, hydrALAZINE, morphine injection, ondansetron **OR** ondansetron (ZOFRAN) IV, mouth rinse, oxyCODONE, senna-docusate Allergies  Allergen Reactions   Bee Venom Anaphylaxis   Shellfish Allergy Anaphylaxis and Hives    OBJECTIVE: Vitals:   02/04/22 2054 02/05/22 0408 02/05/22 0812 02/05/22 1528  BP: 118/79 (!) 150/72 125/67 (!) 145/70  Pulse: (!) 102 94 82 84  Resp: 18 18    Temp: 99.2 F (37.3 C) 98.7 F (37.1 C) 97.7 F (36.5 C)   TempSrc: Oral Oral Oral Oral  SpO2: 97% 96% 100% 100%  Weight:      Height:       Body mass index is 22.32 kg/m.  Physical Exam Constitutional:      General: He is not in acute distress.    Appearance: He is normal weight. He is not toxic-appearing.  HENT:     Head: Normocephalic and atraumatic.     Right Ear: External ear normal.  Left Ear: External ear normal.     Nose: No congestion or rhinorrhea.     Mouth/Throat:     Mouth: Mucous membranes are moist.     Pharynx: Oropharynx is clear.  Eyes:     Extraocular Movements: Extraocular movements intact.     Conjunctiva/sclera: Conjunctivae normal.     Pupils: Pupils are equal, round, and reactive to light.  Cardiovascular:     Rate and Rhythm: Normal rate and regular rhythm.     Heart sounds: No murmur heard.     No friction rub. No gallop.  Pulmonary:     Effort: Pulmonary effort is normal.     Breath sounds: Normal breath sounds.  Abdominal:     General: Abdomen is flat. Bowel sounds are normal.     Palpations: Abdomen is soft.  Musculoskeletal:        General: No swelling.     Cervical back: Normal range of motion and neck supple.     Comments: Left foot bandaged  Skin:    General: Skin is warm and dry.  Neurological:     General: No focal deficit present.     Mental Status: He is oriented to person, place, and time.  Psychiatric:        Mood and Affect: Mood normal.       Lab Results Lab Results  Component Value Date   WBC 7.1 02/05/2022   HGB 9.0 (L) 02/05/2022   HCT 27.7 (L) 02/05/2022   MCV 78.0 (L) 02/05/2022   PLT 350 02/05/2022    Lab Results  Component Value Date   CREATININE 1.49 (H) 02/05/2022   BUN 16 02/05/2022   NA 131 (L) 02/05/2022   K 4.0 02/05/2022   CL 99 02/05/2022   CO2 22 02/05/2022    Lab Results  Component Value Date   ALT 15 02/05/2022   AST 15 02/05/2022   ALKPHOS 72 02/05/2022   BILITOT 0.5 02/05/2022        Danelle Earthly, MD [facility] 02/05/2022, 7:44 PM

## 2022-02-05 NOTE — Progress Notes (Signed)
  Subjective:  Patient ID: Dennis Harris, male    DOB: May 09, 1966,  MRN: [id number]  A 55 y.o. male s/p Left foot incision/drainage, washout with biopsies. He is doing well at bedside. Pain controlled. Banage clean dry intact.   Objective:   Vitals:   02/05/22 0408 02/05/22 0812  BP: (!) 150/72 125/67  Pulse: 94 82  Resp: 18   Temp: 98.7 F (37.1 C) 97.7 F (36.5 C)  SpO2: 96% 100%   General AA&O x3. Normal mood and affect.  Vascular Dorsalis pedis and posterior tibial pulses 2/4 bilat. Brisk capillary refill to all digits. Pedal hair present.  Neurologic Epicritic sensation grossly intact.  Dermatologic No calf pain. Motor/sensory functions are intact. Dressing is clean  Orthopedic: MMT 5/5 in dorsiflexion, plantarflexion, inversion, and eversion. Normal joint ROM without pain or crepitus.    Assessment & Plan:  Patient was evaluated and treated and all questions answered.  Left TN and STJ  septic arthritis s/p  s/p Left foot incision/drainage, washout with biopsies -All questions and concerns were discussed. -Antibiotics per ID  -Patient is okay to be discharge from podiatric standpoint. WBAT in CAM boot. No dressing change until follow up.  -F/u in clinic one week from DC -Cultures: No growth thus far  Candelaria Stagers, DPM  Accessible via secure chat for questions or concerns.

## 2022-02-05 NOTE — TOC Transition Note (Signed)
Transition of Care [facility]) - CM/SW Discharge Note   Patient Details  Name: Dennis Harris MRN: [id number] Date of Birth: Jan 08, 1966  [facility] CM/SW Contact:  Bess Kinds, RN Phone Number: [phone] 02/05/2022, 4:48 PM   Clinical Narrative:     Notified by MD of patient transitioning home today, and needed PCP and RW. Referral to [facility] for delivery to the room. Contact number for [facility] placed on AVS for patient to call next week and schedule hospital f/u.   Final next level of care: Home/Self Care Barriers to Discharge: No Barriers Identified   Patient Goals and CMS Choice        Discharge Placement                       Discharge Plan and Services                DME Arranged: Walker rolling DME Agency: [employer] Date DME Agency Contacted: 02/05/22 Time DME Agency Contacted: [phone] Representative spoke with at DME Agency: Elease Hashimoto            Social Determinants of Health (SDOH) Interventions     Readmission Risk Interventions     No data to display

## 2022-02-05 NOTE — Discharge Summary (Signed)
Physician Discharge Summary   Patient: Dennis Harris MRN: [id number] DOB: 10-07-1965  Admit date:     01/31/2022  Discharge date: 02/05/22  Discharge Physician: Raiford Noble, DO   PCP: Pcp, No   Follow-up and establish with PCP within 1 to 2 weeks and repeat CBC, CMP, mag, Phos within 1 week to monitor renal function carefully Follow-up with podiatry within 1 to 2 weeks Follow-up with infectious disease within 1 to 2 weeks Avoid nephrotoxic medications including NSAIDs given that Bactrim is affecting the kidneys  Discharge Diagnoses: Principal Problem:   Septic arthritis of ankle ([place]) Active Problems:   Hypertension   Rheumatoid arthritis ([place])   Hyperlipidemia   Microcytic anemia   Thrombocytosis   Hyponatremia   AKI (acute kidney injury) ([place])   Hypoalbuminemia  Resolved Problems:   * No resolved hospital problems. *  Hospital Course: Dennis Harris is a 56 y.o. 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  Patient started having left ankle pain for about a month and has been followed by podiatry.  He underwent aspiration of the ankle joint on 01/12/2021 which grew out stenotrophomonas maltophilia and was treated with doxycycline.  MRI on 01/23/2022 showed septic arthritis and osteomyelitis.  Podiatry recommended hospitalization but he only showed up to the hospital on 01/31/2022.  He continues have significant pain and diminished range of motion.  He currently has no open wound and he is admitted for inpatient surgery currently hemodynamically stable.  Podiatry is planning on taking patient for an incision and drainage with washout with bone biopsy and they are planning on opening of the subtalar joint as well as the talonavicular joint.    Currently still holding off antibiotics for better culture yield and infectious diseases  has been consulted for further evaluation recommendations after his podiatric intervention.  After further discussion with the podiatrist the podiatrist feels there is no signs of septic arthritis will need to come to the OR but he did have some inflammatory fluid that was decompressed and drained and cultures were obtained.  Podiatry recommending consulting infectious diseases for antibiotic management and they felt the patient could be discharged under standpoint once cultures came back.  The infectious disease team evaluated and recommending starting IV Bactrim and minocycline to cover for Stenotrophomonas and follow the OR cultures.  They are going to call the patient's rheumatologist in regards to holding the methotrexate for now so now is on antibiotic.  And per podiatry no dressing changes until the patient is following up in the outpatient setting.  Unfortunately patient's renal function slightly worsened in the setting of antibiotics and his home medication and his sodium dropped so we will stop his chlorthalidone and his ARB and start gentle IV fluid hydration.  Assessment and Plan: * Septic arthritis of ankle ([place]) -Ongoing issue for about 1 month.   -MRI left ankle 7/2 showed changes consistent with septic arthritis and osteomyelitis.   -Podiatry planning for I&D with washout and bone biopsy 7/12 and the patient is status post irrigation and debridement of left foot and ankle with biopsy and per my discussion with the podiatrist did not feel that patient had septic arthritis but they did feel that the patient had some inflammatory fluid that was decompressed and drained and cultures were obtained -CRP is elevated 8.4 -Given chronicity of infection without signs of systemic illness will hold off on  initiating IV antibiotics for now but low threshold to hold for any signs of developing sepsis.  Per podiatry documentation ankle aspiration performed 6/21 grew Stenotrophomonas maltophilia. -VTE  Prophylaxis with Heparin 5,000 units sq q8h -Podiatry planning for washout on 7/12; anticipate collection of wound cultures and bone biopsy; per podiatry they may do a staged surgery and they are planning to take him back to the OR but given lack of purulence they feel that he is stable from this perspective -Hold IV antibiotics for now, start vancomycin and ceftriaxone if any signs of developing sepsis -Continue analgesics as needed IV morphine 1 mg every 4 as needed severe pain as well as p.o. oxycodone 5 to 10 mg every 4 as needed moderate pain -Bowel regimen with senna docusate 1 tab p.o. nightly as needed for mild constipation -Consulted infectious diseases after operative procedure and will follow-up on cultures and have them adjust and recommend antibiotics; they have now started the patient on minocycline as well as IV Bactrim we will continue further recommendations once sensitivities were available -Patient is now back on diet and podiatry recommending no dressing changes until he follows up in outpatient setting -We will defer weightbearing recommendations to the podiatrist  Hypertension -Continue home Amlodipine 5 mg po Daily  But will hold Chlorthalidone 25 mg po Daily and Losartan 25 mg po Daily -Continue to Monitor BP per Protocol -Last BP reading was a little elevated at 124/78  Hypoalbuminemia -Patient's Albumin Level went from 2.4 -> 2.7 -> 2.5 -Continue to Monitor and Trend and repeat CMP in the AM   AKI (acute kidney injury) ([place]) - In the setting of Bactrim, chlorthalidone and his ARB -Patient's BUNs/creatinine went from 8/0.99 -> 13/1.16 -> 14/1.34 -Hold his ARB and his chlorthalidone and start gentle IV fluid hydration with normal saline at 75 mils per hour -Avoid further nephrotoxic medications, contrast dyes, hypotension and dehydration and will need to renally adjust medications -Repeat CMP in a.m. and continue to monitor and trend renal function  carefully  Hyponatremia -Patient's sodium went from 132 -> 138 -> 131 x2 and dropped down to 128 or hold his chlorthalidone and start gentle IV fluid hydration -May need to hold chlorthalidone if continues to drop -Repeat and trend sodium level in the a.m.  Thrombocytosis - Patient's platelet count went from 464 -> 472 -> 414 -> 416 -> 394 -Likely reactive in the setting of above -Continue to monitor and trend and repeat CBC in a.m.  Microcytic anemia - His hemoglobin/hematocrit went from 10.1/32.3 -> 10.6/22.9 -> 10.3/32.0 -> 10.7/33.7 and is now 10.0/31.7 with an MCV of 79.3 -Checked anemia panel and showed an iron level of 32, U IBC of 263, TIBC of 295, saturation ratios of 11%, ferritin level 306, folate level of 8.3, vitamin B12 of 339 -Continue to monitor for signs and symptoms bleeding; no overt bleeding noted -Repeat CBC in a.m.   Hyperlipidemia -Patient not currently taking atorvastatin per med reconciliation.  Rheumatoid arthritis ([facility]) -Continue to Hold methotrexate for now  -Dr. Candiss Norse has contacted the patient's primary rheumatologist Dr. Benjamine Mola for further evaluation and recommendations for the methotrexate and prednisone  Pain control - [place] was reviewed. and patient was instructed, not to drive, operate heavy machinery, perform activities at heights, swimming or participation in water activities or provide baby-sitting services while on Pain, Sleep and Anxiety Medications; until their outpatient Physician has advised to do so again. Also recommended to not to take more than  prescribed Pain, Sleep and Anxiety Medications.   Consultants: ID, Podiatry  Procedures performed: IRRIGATION AND DEBRIDEMENT LEFT FOOT/ANKLE (Left) With biopsy    Disposition: Home Diet recommendation:  Discharge Diet Orders (From admission, onward)     Start     Ordered   02/05/22 0000  Diet - low sodium heart healthy        02/05/22 1832            Cardiac diet DISCHARGE MEDICATION: Allergies as of 02/05/2022       Reactions   Bee Venom Anaphylaxis   Shellfish Allergy Anaphylaxis, Hives        Medication List     STOP taking these medications    aspirin 81 MG chewable tablet   chlorthalidone 25 MG tablet Commonly known as: HYGROTON   ibuprofen 200 MG tablet Commonly known as: ADVIL   losartan 25 MG tablet Commonly known as: COZAAR   methotrexate 2.5 MG tablet   oxyCODONE-acetaminophen 5-325 MG tablet Commonly known as: Percocet       TAKE these medications    acetaminophen 325 MG tablet Commonly known as: TYLENOL Take 2 tablets (650 mg total) by mouth every 6 (six) hours as needed for mild pain (or Fever >/= 101).   amLODipine 10 MG tablet Commonly known as: NORVASC Take 1 tablet (10 mg total) by mouth daily. What changed:  medication strength how much to take how to take this when to take this   atorvastatin 40 MG tablet Commonly known as: LIPITOR Take 1 tablet (40 mg total) by mouth daily.   carvedilol 3.125 MG tablet Commonly known as: COREG Take 1 tablet (3.125 mg total) by mouth 2 (two) times daily.   ferrous sulfate 325 (65 FE) MG tablet Take 1 tablet (325 mg total) by mouth daily with breakfast.   folic acid 1 MG tablet Commonly known as: FOLVITE Take 1 tablet (1 mg total) by mouth daily.   gabapentin 300 MG capsule Commonly known as: NEURONTIN Take 1 capsule (300 mg total) by mouth once nightly at bedtime.   HYDROCORTISONE EX Apply 1 application topically 2 (two) times daily as needed (for razor bumps/rash/itching.).   minocycline 100 MG capsule Commonly known as: MINOCIN Take 2 capsules (200 mg total) by mouth 2 (two) times daily for 28 days.   nitroGLYCERIN 0.4 MG SL tablet Commonly known as: NITROSTAT Place 1 tablet (0.4 mg total) under the tongue every 5 (five) minutes as needed for chest pain.   ondansetron 4 MG tablet Commonly known as: ZOFRAN Take 1  tablet (4 mg total) by mouth every 6 (six) hours as needed for nausea.   oxyCODONE 5 MG immediate release tablet Commonly known as: Oxy IR/ROXICODONE Take 1 tablet (5 mg total) by mouth every 6 (six) hours as needed for up to 3 days for moderate pain.   senna-docusate 8.6-50 MG tablet Commonly known as: Senokot-S Take 1 tablet by mouth at bedtime as needed for mild constipation.   sulfamethoxazole-trimethoprim 800-160 MG tablet Commonly known as: BACTRIM DS Take 2 tablets by mouth every 8 (eight) hours for 28 days.   Vitamin D-3 125 MCG (5000 UT) Tabs Take 1 tablet by mouth daily.               Durable Medical Equipment  (From admission, onward)           Start     Ordered   02/05/22 1629  For home use only DME Walker rolling  Once  Question Answer Comment  Walker: With [place]   Patient needs a walker to treat with the following condition Pain      02/05/22 1628            Follow-up Information     [facility]. Schedule an appointment as soon as possible for a visit in 2 week(s).   Why: Call Monday to schedule a hospital follow up appointment OR you can call your insurance company and request list of in network providers. Contact information: [address] [place] [phone]               Discharge Exam: Danley Danker Weights   02/01/22 1200  Weight: 72.6 kg   Vitals:   02/05/22 0812 02/05/22 1528  BP: 125/67 (!) 145/70  Pulse: 82 84  Resp:    Temp: 97.7 F (36.5 C)   SpO2: 100% 100%   Examination: Physical Exam:  Constitutional: WN/WD, NAD and appears calm and comfortable Eyes: PERRL, lids and conjunctivae normal, sclerae anicteric  ENMT: External Ears, Nose appear normal. Grossly normal hearing. Mucous membranes are moist. Posterior pharynx clear of any exudate or lesions. Normal dentition.  Neck: Appears normal, supple, no cervical masses, normal ROM, no  appreciable thyromegaly Respiratory: Clear to auscultation bilaterally, no wheezing, rales, rhonchi or crackles. Normal respiratory effort and patient is not tachypenic. No accessory muscle use.  Cardiovascular: RRR, no murmurs / rubs / gallops. S1 and S2 auscultated. No extremity edema. 2+ pedal pulses. No carotid bruits.  Abdomen: Soft, non-tender, non-distended. No masses palpated. No appreciable hepatosplenomegaly. Bowel sounds positive.  GU: Deferred. Musculoskeletal: No clubbing / cyanosis of digits/nails. No joint deformity upper and lower extremities. Good ROM, no contractures. Normal strength and muscle tone.  Skin: No rashes, lesions, ulcers. No induration; Warm and dry.  Neurologic: CN 2-12 grossly intact with no focal deficits. Sensation intact in all 4 Extremities, DTR normal. Strength 5/5 in all 4. Romberg sign cerebellar reflexes not assessed.  Psychiatric: Normal judgment and insight. Alert and oriented x 3. Normal mood and appropriate affect.    Condition at discharge: stable  The results of significant diagnostics from this hospitalization (including imaging, microbiology, ancillary and laboratory) are listed below for reference.   Imaging Studies: MR ANKLE LEFT W WO CONTRAST  Result Date: 01/25/2022 CLINICAL DATA:  Lateral ankle pain and swelling for 3 and half months. Septic arthritis. EXAM: MRI OF THE LEFT ANKLE WITHOUT AND WITH CONTRAST TECHNIQUE: Multiplanar, multisequence MR imaging of the ankle was performed before and after the administration of intravenous contrast. CONTRAST:  58mL MULTIHANCE GADOBENATE DIMEGLUMINE 529 MG/ML IV SOLN COMPARISON:  Radiographs 01/04/2022 FINDINGS: TENDONS Peroneal: Intact.  No significant tendinopathy or tenosynovitis. Posteromedial: Moderate tendinopathy and tenosynovitis involving the posterior tibialis tendon. Could not exclude septic tendinopathy. Anterior: Intact.  No tendinopathy or tenosynovitis. Achilles: No tear or significant  tendinopathy. Distal attachment calcifications. Plantar Fascia: Intact.  No findings for plantar fasciitis. LIGAMENTS Lateral: Suspect remote anterior talofibular ligament tear with a discoid lesion. Medial: Intact CARTILAGE Ankle Joint: There is a small ankle joint effusion but the articular cartilage appears to be intact and I do not see any marrow edema in the talus or tibia to suggest septic arthritis. Subtalar Joints/Sinus Tarsi: Joint space narrowing at the middle talocalcaneal facet with moderate cartilage loss and marrow edema. Similar findings involving the talonavicular joint. Adjacent rim enhancing fluid collection measuring 2 cm along the lateral aspect of the  talar Baker joint most consistent with an abscess. There also small rim enhancing fluid collections surrounding the middle talocalcaneal facet and sinus tarsi worrisome for small abscesses. Marked inflammation/edema and enhancement in the sinus tarsi and in the surrounding soft tissues. Bones: Moderate edema like signal changes and enhancement in the talus, navicular bone and calcaneus as above. Other: Mild myositis and cellulitis. IMPRESSION: IMPRESSION 1. MR findings consistent with septic arthritis and osteomyelitis involving the subtalar and talonavicular joints as detailed above. Small adjacent abscesses. 2. No definite MR findings for septic arthritis at the tibiotalar joint. 3. Mild myositis and cellulitis. 4. Tendinopathy and tenosynovitis involving the posterior tibialis tendon could not exclude septic tenosynovitis. Electronically Signed   By: Marijo Sanes M.D.   On: 01/25/2022 10:23   DG Foot Complete Left  Result Date: 01/21/2022 Please see detailed radiograph report in office note.  DG Ankle 2 Views Left  Result Date: 01/21/2022 Please see detailed radiograph report in office note.   Microbiology: Results for orders placed or performed during the hospital encounter of 01/31/22  Culture, blood (routine x 2)     Status: None    Collection Time: 01/31/22  1:47 PM   Specimen: BLOOD  Result Value Ref Range Status   Specimen Description BLOOD RIGHT ARM  Final   Special Requests   Final    BOTTLES DRAWN AEROBIC AND ANAEROBIC Blood Culture results may not be optimal due to an inadequate volume of blood received in culture bottles   Culture   Final    NO GROWTH 5 DAYS Performed at [facility], [place] [address] [place], [place] [address]    Report Status 02/05/2022 FINAL  Final  Culture, blood (routine x 2)     Status: None   Collection Time: 01/31/22  5:53 PM   Specimen: BLOOD  Result Value Ref Range Status   Specimen Description BLOOD LEFT ANTECUBITAL  Final   Special Requests   Final    BOTTLES DRAWN AEROBIC AND ANAEROBIC Blood Culture results may not be optimal due to an inadequate volume of blood received in culture bottles   Culture   Final    NO GROWTH 5 DAYS Performed at [facility], [place] [address] [place], [place] [address]    Report Status 02/05/2022 FINAL  Final  Surgical PCR screen     Status: None   Collection Time: 02/01/22  2:01 PM   Specimen: Nasal Mucosa; Nasal Swab  Result Value Ref Range Status   MRSA, PCR NEGATIVE NEGATIVE Final   Staphylococcus aureus NEGATIVE NEGATIVE Final    Comment: (NOTE) The Xpert SA Assay (FDA approved for NASAL specimens in patients 32 years of age and older), is one component of a comprehensive surveillance program. It is not intended to diagnose infection nor to guide or monitor treatment. Performed at [facility], [place] [address] [place], [place] [address]   Aerobic/Anaerobic Culture w Gram Stain (surgical/deep wound)     Status: None (Preliminary result)   Collection Time: 02/02/22  5:00 PM   Specimen: PATH Bone biopsy; Tissue  Result Value Ref Range Status   Specimen Description BONE  Final   Special Requests TATUS  Final   Gram Stain   Final    RARE WBC PRESENT, PREDOMINANTLY MONONUCLEAR NO ORGANISMS SEEN Gram Stain Report  Called to,Read Back By and Verified With: RN I. WISE [id number] @2050  FH    Culture   Final    NO GROWTH 3 DAYS NO  ANAEROBES ISOLATED; CULTURE IN PROGRESS FOR 5 DAYS Performed at [facility], [place] [address] [place], [place] [address]    Report Status PENDING  Incomplete  Aerobic/Anaerobic Culture w Gram Stain (surgical/deep wound)     Status: None (Preliminary result)   Collection Time: 02/02/22  5:00 PM   Specimen: PATH Bone biopsy; Tissue  Result Value Ref Range Status   Specimen Description BONE  Final   Special Requests CALCANEUS  Final   Gram Stain   Final    NO WBC SEEN NO ORGANISMS SEEN Gram Stain Report Called to,Read Back By and Verified With: RN I. WISE [id number] @2050  FH    Culture   Final    NO GROWTH 3 DAYS NO ANAEROBES ISOLATED; CULTURE IN PROGRESS FOR 5 DAYS Performed at [facility], [place] [address], [place], [place] [address]    Report Status PENDING  Incomplete  Acid Fast Smear (AFB)     Status: None   Collection Time: 02/02/22  5:00 PM   Specimen: PATH Bone biopsy; Tissue  Result Value Ref Range Status   AFB Specimen Processing Comment  Final    Comment: Tissue Grinding and Digestion/Decontamination   Acid Fast Smear Negative  Final    Comment: (NOTE) Performed At: [facility] [place], [place] [id number] Rush Farmer MD [id number]    Source (AFB) BONE  Final    Comment: TATUS Performed at [facility], [place] [address] [place], [place] [address]   Acid Fast Smear (AFB)     Status: None   Collection Time: 02/02/22  5:00 PM   Specimen: PATH Bone biopsy; Tissue  Result Value Ref Range Status   AFB Specimen Processing Comment  Final    Comment: Tissue Grinding and Digestion/Decontamination   Acid Fast Smear Negative  Final    Comment: (NOTE) Performed At: [facility] [place], [place] [id number] Rush Farmer MD [id number]    Source (AFB) BONE  Final    Comment: CALCANEUS Performed  at [facility], [place] [address] [place], [place] [address]   Aerobic/Anaerobic Culture w Gram Stain (surgical/deep wound)     Status: None (Preliminary result)   Collection Time: 02/02/22  5:01 PM   Specimen: PATH Other; Tissue  Result Value Ref Range Status   Specimen Description WOUND  Final   Special Requests PRELAVAGE SWAB OF TM JNT  Final   Gram Stain   Final    MODERATE WBC PRESENT,BOTH PMN AND MONONUCLEAR NO ORGANISMS SEEN Gram Stain Report Called to,Read Back By and Verified With: RN I. WISE [id number] @2050  FH    Culture   Final    NO GROWTH 3 DAYS NO ANAEROBES ISOLATED; CULTURE IN PROGRESS FOR 5 DAYS Performed at [facility], [place] [address] [place], [place] [address]    Report Status PENDING  Incomplete  Aerobic/Anaerobic Culture w Gram Stain (surgical/deep wound)     Status: None (Preliminary result)   Collection Time: 02/02/22  5:07 PM   Specimen: PATH Other; Tissue  Result Value Ref Range Status   Specimen Description WOUND  Final   Special Requests POST LAVAGE SWAB OF TM JNT  Final   Gram Stain   Final    RARE WBC PRESENT, PREDOMINANTLY PMN NO ORGANISMS SEEN Gram Stain Report Called to,Read Back By and Verified With: RN I. WISE [id number] @2050  FH    Culture   Final    NO GROWTH 3 DAYS NO  ANAEROBES ISOLATED; CULTURE IN PROGRESS FOR 5 DAYS Performed at [facility], [address], [place], [place] [address]    Report Status PENDING  Incomplete    Labs: CBC: Recent Labs  Lab 01/31/22 1349 02/01/22 0520 02/02/22 0844 02/03/22 1245 02/04/22 0242 02/05/22 0210  WBC 10.7* 7.6 9.5 9.4 6.6 7.1  NEUTROABS 7.3  --  6.8 6.4 4.2 4.5  HGB 10.1* 10.6* 10.3* 10.7* 10.0* 9.0*  HCT 32.3* 32.9* 32.0* 33.7* 31.7* 27.7*  MCV 79.6* 78.5* 79.2* 79.9* 79.3* 78.0*  PLT 464* 472* 414* 416* 394 350   Basic Metabolic Panel: Recent Labs  Lab 02/02/22 0844 02/03/22 1245 02/04/22 0242 02/05/22 0210 02/05/22 1202  NA 131* 131* 128* 130* 131*  K 4.1 3.8 4.0  4.5 4.0  CL 99 96* 97* 102 99  CO2 22 25 22  20* 22  GLUCOSE 98 102* 100* 90 97  BUN 8 13 14 15 16   CREATININE 0.99 1.16 1.34* 1.34* 1.49*  CALCIUM 9.1 9.3 8.7* 8.7* 9.3  MG 1.8 2.1 1.9 1.8  --   PHOS 4.9* 4.1 4.4 4.4  --    Liver Function Tests: Recent Labs  Lab 01/31/22 1349 02/02/22 0844 02/03/22 1245 02/04/22 0242 02/05/22 0210  AST 18 15 16 15 15   ALT 15 13 13 14 15   ALKPHOS 99 83 90 78 72  BILITOT 0.4 0.4 0.4 0.5 0.5  PROT 8.9* 8.5* 9.3* 8.5* 7.8  ALBUMIN 2.8* 2.4* 2.7* 2.5* 2.3*   CBG: No results for input(s): "GLUCAP" in the last 168 hours.  Discharge time spent: greater than 30 minutes.  Signed: 02/06/22, DO [facility] 02/05/2022

## 2022-02-05 NOTE — Plan of Care (Signed)
  Problem: Education: Goal: Knowledge of General Education information will improve Description: Including pain rating scale, medication(s)/side effects and non-pharmacologic comfort measures Outcome: Progressing   Problem: Clinical Measurements: Goal: Ability to maintain clinical measurements within normal limits will improve Outcome: Progressing Goal: Will remain free from infection Outcome: Progressing   Problem: Activity: Goal: Risk for activity intolerance will decrease Outcome: Progressing   Problem: Coping: Goal: Level of anxiety will decrease Outcome: Progressing   Problem: Pain Managment: Goal: General experience of comfort will improve Outcome: Progressing   Problem: Safety: Goal: Ability to remain free from injury will improve Outcome: Progressing   Problem: Skin Integrity: Goal: Risk for impaired skin integrity will decrease Outcome: Progressing   Problem: Clinical Measurements: Goal: Respiratory complications will improve Outcome: Adequate for Discharge   Problem: Nutrition: Goal: Adequate nutrition will be maintained Outcome: Adequate for Discharge

## 2022-02-07 ENCOUNTER — Ambulatory Visit (INDEPENDENT_AMBULATORY_CARE_PROVIDER_SITE_OTHER): Payer: Managed Care, Other (non HMO) | Admitting: Podiatry

## 2022-02-07 DIAGNOSIS — M86172 Other acute osteomyelitis, left ankle and foot: Secondary | ICD-10-CM

## 2022-02-07 DIAGNOSIS — R609 Edema, unspecified: Secondary | ICD-10-CM

## 2022-02-07 LAB — ACID FAST SMEAR (AFB, MYCOBACTERIA)
Acid Fast Smear: NEGATIVE
Acid Fast Smear: NEGATIVE

## 2022-02-07 LAB — AEROBIC/ANAEROBIC CULTURE W GRAM STAIN (SURGICAL/DEEP WOUND)
Culture: NO GROWTH
Culture: NO GROWTH
Culture: NO GROWTH
Gram Stain: NONE SEEN

## 2022-02-07 NOTE — Progress Notes (Signed)
Subjective: Dennis Harris is a 56 y.o. is seen today in office s/p left ankle/foot I&D, bone biopsy preformed on 02/02/2022 with Dr. Allena Katz. States that he still gets some achy sensation. States he left the hospital Saturday night and that night it started hurting.  He takes pain medication intermittently.  Last night he slept on the sofa with the left foot down and it felt better.  Overall he reports the swelling has improved compared to what it was prior to surgery but still notes some residual swelling.  Denies any systemic complaints such as fevers, chills, nausea, vomiting.   He is on minocycline 200mg  BID and bactrim 2 every 8 hours. He states it makes him feels "jittery". He is to get blood work today.   Specimen Description WOUND   Special Requests POST LAVAGE SWAB OF TM JNT   Gram Stain RARE WBC PRESENT, PREDOMINANTLY PMN  NO ORGANISMS SEEN  Gram Stain Report Called to,Read Back By and Verified With: RN I. WISE @2050  FH   Culture No growth aerobically or anaerobically.  Performed at [facility], [address] [place], [address] [place]   Report Status 02/07/2022 FINAL     Specimen Description WOUND   Special Requests PRELAVAGE SWAB OF TM JNT   Gram Stain MODERATE WBC PRESENT,BOTH PMN AND MONONUCLEAR  NO ORGANISMS SEEN  Gram Stain Report Called to,Read Back By and Verified With: RN I. WISE [phone] @2050  FH   Culture No growth aerobically or anaerobically.  Performed at [facility], [address] [place], [facility] [address]   Report Status 02/07/2022 FINAL     Objective: General: No acute distress, AAOx3  DP/PT pulses palpable 2/4, CRT < 3 sec to all digits.  Protective sensation intact. Motor function intact.  Left foot: Incision is well coapted without any evidence of dehiscence with staples intact. There is no surrounding erythema, ascending cellulitis, fluctuance, crepitus, malodor, drainage/purulence. There is mild to moderate but improved edema  around the surgical site. There is mild pain along the surgical site.  Swelling much improved compared to prior to surgery. No pain with calf compression, swelling, warmth, erythema.   Assessment and Plan:  Status post left foot incision and drainage, bone biopsy, doing well with no complications   -Treatment options discussed including all alternatives, risks, and complications -ABI given pain at night and having to put his foot down although clinically has palpable pulses. -Continue antibiotics per infectious disease.  He states is making him jittery. -Antibiotic ointment and bandage applied.  He can keep dressing clean, dry, intact.  We will plan on removing staples, sutures next appointment. -CAM boot -Pain medication as needed. -We will recheck blood work including CBC, sed rate, CRP, BMP -Monitor for any clinical signs or symptoms of infection and DVT/PE and directed to call the office immediately should any occur or go to the ER. -Follow-up as scheduled or sooner if any problems arise. In the meantime, encouraged to call the office with any questions, concerns, change in symptoms.   *Likely suture, staple removal next appointment  [place], DPM

## 2022-02-08 ENCOUNTER — Encounter: Payer: Self-pay | Admitting: Podiatry

## 2022-02-08 ENCOUNTER — Other Ambulatory Visit: Payer: Self-pay

## 2022-02-09 LAB — AEROBIC/ANAEROBIC CULTURE W GRAM STAIN (SURGICAL/DEEP WOUND)

## 2022-02-14 ENCOUNTER — Other Ambulatory Visit: Payer: Self-pay | Admitting: Podiatry

## 2022-02-14 ENCOUNTER — Inpatient Hospital Stay: Payer: Managed Care, Other (non HMO) | Admitting: Internal Medicine

## 2022-02-14 ENCOUNTER — Other Ambulatory Visit: Payer: Self-pay

## 2022-02-14 DIAGNOSIS — I739 Peripheral vascular disease, unspecified: Secondary | ICD-10-CM

## 2022-02-17 ENCOUNTER — Other Ambulatory Visit: Payer: Self-pay

## 2022-02-17 ENCOUNTER — Ambulatory Visit: Payer: Managed Care, Other (non HMO) | Admitting: Podiatry

## 2022-02-17 ENCOUNTER — Encounter: Payer: Self-pay | Admitting: Internal Medicine

## 2022-02-17 ENCOUNTER — Ambulatory Visit (INDEPENDENT_AMBULATORY_CARE_PROVIDER_SITE_OTHER): Payer: Managed Care, Other (non HMO) | Admitting: Internal Medicine

## 2022-02-17 VITALS — BP 145/83 | HR 103 | Resp 16 | Ht 71.0 in | Wt 160.0 lb

## 2022-02-17 DIAGNOSIS — M009 Pyogenic arthritis, unspecified: Secondary | ICD-10-CM

## 2022-02-17 DIAGNOSIS — M05772 Rheumatoid arthritis with rheumatoid factor of left ankle and foot without organ or systems involvement: Secondary | ICD-10-CM | POA: Diagnosis not present

## 2022-02-17 DIAGNOSIS — Z09 Encounter for follow-up examination after completed treatment for conditions other than malignant neoplasm: Secondary | ICD-10-CM

## 2022-02-17 NOTE — Assessment & Plan Note (Signed)
Follows with rheumatology.  MTX and prednisone on hold while treating infection.

## 2022-02-17 NOTE — Progress Notes (Signed)
Patient seen in office for removal of staples to left foot and ankle. Patient denies any fever, nausea, vomiting or chest pain at this time. Patient complains of constant sharp pain to top of foot.   Applied lidocaine cream to incision sites and proceeded to remove staples. Nurse left 2 staples to top of left foot and 3 staples to left ankle for the incision to fully heal.  Provider assessed area and agreed with leaving staples intact until next visit. Covered incision site with adhesive dressing followed by 4x4 gauze, gauze wrap, ace wrap and stockinet.   Advise patient to contact our office or go to the ED for any signs and symptoms of infection.   Patient will return in one week to follow up with Dr. Ardelle Anton.

## 2022-02-17 NOTE — Assessment & Plan Note (Signed)
Patient will continue with current antibiotics Bactrim 2 DS tablets q8h and minocycline 200mg  BID as outlined by Dr .  Planning for 4 weeks of therapy through 03/01/22 for septic arthritis given negative pathology for osteomyelitis at time of I&D.  The rare Eggartella catenaformis that grew from 1 culture out of 4 specimens taken is not of clear significance and may be a contaminant especially with negative pathology.  Since he is overall doing okay today will not adjust therapy.  Check labs today and follow up with Dr 05/01/22 on 8/10.  He also sees podiatry today.

## 2022-02-17 NOTE — Patient Instructions (Signed)
Continue current antibiotics and follow up with Dr Quincy Simmonds today and follow up with podiatry as well

## 2022-02-17 NOTE — Progress Notes (Signed)
[facility]  CHIEF COMPLAINT:    Follow up for septic arthritis  SUBJECTIVE:    Dennis Harris is a 56 y.o. male with PMHx as below who presents to the clinic for septic arthritis.   Patient here today for hospital follow up after recent admission from 7/10-7/15 for left ankle infection s/p I&D and bone biopsy 02/02/22.  Also, s/p aspiration of left ankle 6/21 with cultures that grew Stenotrophomonas .  Seen by Dr Thedore Mins while inpatient.  Discharged on Bactrim 2 DS tablets q8h and minocycline 200mg  BID.  I&D cultures were no growth except for 1 of 4 cultures from [place] noting rare Eggartella catenaformis.  His pathology was negative for OM so planning for 4 weeks course to treat native septic arthritis.  Here today for follow up.  Taking antibiotics still which are making him jittery but he is otherwise tolerating okay.  His jitters have improved as he has learned to take the antibiotics with food.  Saw podiatry for follow up since hospital discharge and will go back again today at 1pm.  He has no fevers or chills.  Staples remain in place with no wound dehiscence, drainage, erythema.  Swelling is improved.  He reports ongoing pain.  Please see A&P for the details of today's visit and status of the patient's medical problems.   Patient's Medications  New Prescriptions   No medications on file  Previous Medications   ACETAMINOPHEN (TYLENOL) 325 MG TABLET    Take 2 tablets (650 mg total) by mouth every 6 (six) hours as needed for mild pain (or Fever >/= 101).   AMLODIPINE (NORVASC) 10 MG TABLET    Take 1 tablet (10 mg total) by mouth daily.   ATORVASTATIN (LIPITOR) 40 MG TABLET    Take 1 tablet (40 mg total) by mouth daily.   CARVEDILOL (COREG) 3.125 MG TABLET    Take 1 tablet (3.125 mg total) by mouth 2 (two) times daily.   CHOLECALCIFEROL (VITAMIN D-3) 125 MCG (5000 UT) TABS    Take 1 tablet by mouth daily.   FERROUS SULFATE 325 (65 FE) MG TABLET     Take 1 tablet (325 mg total) by mouth daily with breakfast.   FOLIC ACID (FOLVITE) 1 MG TABLET    Take 1 tablet (1 mg total) by mouth daily.   GABAPENTIN (NEURONTIN) 300 MG CAPSULE    Take 1 capsule (300 mg total) by mouth once nightly at bedtime.   HYDROCORTISONE EX    Apply 1 application topically 2 (two) times daily as needed (for razor bumps/rash/itching.).   MINOCYCLINE (MINOCIN) 100 MG CAPSULE    Take 2 capsules (200 mg total) by mouth 2 (two) times daily for 28 days.   NITROGLYCERIN (NITROSTAT) 0.4 MG SL TABLET    Place 1 tablet (0.4 mg total) under the tongue every 5 (five) minutes as needed for chest pain.   ONDANSETRON (ZOFRAN) 4 MG TABLET    Take 1 tablet (4 mg total) by mouth every 6 (six) hours as needed for nausea.   SENNA-DOCUSATE (SENOKOT-S) 8.6-50 MG TABLET    Take 1 tablet by mouth at bedtime as needed for mild constipation.   SULFAMETHOXAZOLE-TRIMETHOPRIM (BACTRIM DS) 800-160 MG TABLET    Take 2 tablets by mouth every 8 (eight) hours for 28 days.  Modified Medications   No medications on file  Discontinued Medications   No medications on file      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   Exertional shortness of breath    "at times" (11/14/2012)   Hypercholesteremia    Hypertension    Pericarditis ~ 2009   Pneumonia    Rheumatoid arthritis(714.0)     Social History   Tobacco Use   Smoking status: Every Day    Packs/day: 0.50    Years: 37.00    Total pack years: 18.50    Types: Cigarettes   Smokeless tobacco: Never  Vaping Use   Vaping Use: Never used  Substance Use Topics   Alcohol use: Yes    Alcohol/week: 14.0 standard drinks of alcohol    Types: 14 Cans of beer per week    Comment: 2-3 12 ounce cans of beer per day   Drug use: Not Currently    Types: Marijuana    Comment: 12/13/2016 "nothing in years"    Family History  Problem Relation Age of Onset   Cancer Mother    Hypertension Mother     Stroke Mother    Diabetes Mother    Cirrhosis Father    Lupus Sister    Lupus Sister    Breast cancer Sister     Allergies  Allergen Reactions   Bee Venom Anaphylaxis   Shellfish Allergy Anaphylaxis and Hives    Review of Systems  All other systems reviewed and are negative. Except as noted above.   OBJECTIVE:    Vitals:   02/17/22 0957  BP: (!) 145/83  Pulse: (!) 103  Resp: 16  SpO2: 98%  Weight: 160 lb (72.6 kg)  Height: 5\' 11"  (1.803 m)   Body mass index is 22.32 kg/m.  Physical Exam Constitutional:      General: He is not in acute distress.    Appearance: Normal appearance.  HENT:     Head: Normocephalic and atraumatic.  Musculoskeletal:     Right lower leg: No edema.     Left lower leg: No edema.     Comments: Left ankle incision with staples intact.  No drainage, cellulitis, warmth, erythema.  ROM limited somewhat due to pain.   Skin:    General: Skin is warm and dry.  Neurological:     General: No focal deficit present.     Mental Status: He is alert and oriented to person, place, and time.  Psychiatric:        Mood and Affect: Mood normal.        Behavior: Behavior normal.      Labs and Microbiology:    Latest Ref Rng & Units 02/05/2022    2:10 AM 02/04/2022    2:42 AM 02/03/2022   12:45 PM  CBC  WBC 4.0 - 10.5 K/uL 7.1  6.6  9.4   Hemoglobin 13.0 - 17.0 g/dL 9.0  02/05/2022  08.6   Hematocrit 39.0 - 52.0 % 27.7  31.7  33.7   Platelets 150 - 400 K/uL 350  394  416       Latest Ref Rng & Units 02/05/2022   12:02 PM 02/05/2022    2:10 AM 02/04/2022    2:42 AM  CMP  Glucose 70 - 99 mg/dL 97  90  02/06/2022   BUN 6 - 20 mg/dL 16  15  14    Creatinine 0.61 - 1.24 mg/dL 469   6.29   Sodium 135 - 145 mmol/L 131  130  128   Potassium 3.5 - 5.1 mmol/L 4.0  4.5  4.0   Chloride 98 - 111 mmol/L 99  102  97   CO2 22 - 32 mmol/L 22  20  22    Calcium 8.9 - 10.3 mg/dL 9.3  8.7  8.7   Total Protein 6.5 - 8.1 g/dL  7.8  8.5   Total Bilirubin 0.3 - 1.2 mg/dL   0.5  0.5   Alkaline Phos 38 - 126 U/L  72  78   AST 15 - 41 U/L  15  15   ALT 0 - 44 U/L  15  14         ASSESSMENT & PLAN:    Septic arthritis of ankle (HCC) Patient will continue with current antibiotics Bactrim 2 DS tablets q8h and minocycline 200mg  BID as outlined by Dr .  Planning for 4 weeks of therapy through 03/01/22 for septic arthritis given negative pathology for osteomyelitis at time of I&D.  The rare Eggartella catenaformis that grew from 1 culture out of 4 specimens taken is not of clear significance and may be a contaminant especially with negative pathology.  Since he is overall doing okay today will not adjust therapy.  Check labs today and follow up with Dr Thedore Mins on 8/10.  He also sees podiatry today.   Rheumatoid arthritis (HCC) Follows with rheumatology.  MTX and prednisone on hold while treating infection.   Orders Placed This Encounter  Procedures   Basic metabolic panel    Order Specific Question:   Has the patient fasted?    Answer:   No   CBC   Sedimentation rate   C-reactive protein        Thedore Mins for Infectious Disease [facility] 02/17/2022, 10:18 AM   I have personally spent 40 minutes involved in face-to-face and non-face-to-face activities for this patient on the day of the visit. Professional time spent includes the following activities: Preparing to see the patient (review of tests), Obtaining and/or reviewing separately obtained history (admission/discharge record), Performing a medically appropriate examination and/or evaluation , Ordering medications/tests/procedures, referring and communicating with other health care professionals, Documenting clinical information in the EMR, Independently interpreting results (not separately reported), Communicating results to the patient/family/caregiver, Counseling and educating the patient/family/caregiver and Care coordination (not separately reported).

## 2022-02-18 ENCOUNTER — Other Ambulatory Visit: Payer: Self-pay | Admitting: Podiatry

## 2022-02-18 ENCOUNTER — Telehealth: Payer: Self-pay | Admitting: Podiatry

## 2022-02-18 ENCOUNTER — Other Ambulatory Visit: Payer: Self-pay

## 2022-02-18 LAB — BASIC METABOLIC PANEL
BUN: 13 mg/dL (ref 7–25)
CO2: 24 mmol/L (ref 20–32)
Calcium: 9.3 mg/dL (ref 8.6–10.3)
Chloride: 103 mmol/L (ref 98–110)
Creat: 1.07 mg/dL (ref 0.70–1.30)
Glucose, Bld: 79 mg/dL (ref 65–99)
Potassium: 4.8 mmol/L (ref 3.5–5.3)
Sodium: 134 mmol/L — ABNORMAL LOW (ref 135–146)

## 2022-02-18 LAB — CBC
HCT: 26.5 % — ABNORMAL LOW (ref 38.5–50.0)
Hemoglobin: 8 g/dL — ABNORMAL LOW (ref 13.2–17.1)
MCH: 25.1 pg — ABNORMAL LOW (ref 27.0–33.0)
MCHC: 30.2 g/dL — ABNORMAL LOW (ref 32.0–36.0)
MCV: 83.1 fL (ref 80.0–100.0)
MPV: 8.7 fL (ref 7.5–12.5)
Platelets: 571 10*3/uL — ABNORMAL HIGH (ref 140–400)
RBC: 3.19 10*6/uL — ABNORMAL LOW (ref 4.20–5.80)
RDW: 12.5 % (ref 11.0–15.0)
WBC: 6.7 10*3/uL (ref 3.8–10.8)

## 2022-02-18 LAB — SEDIMENTATION RATE: Sed Rate: >130 mm/h — ABNORMAL HIGH (ref 0–20)

## 2022-02-18 LAB — C-REACTIVE PROTEIN: CRP: 96.9 mg/L — ABNORMAL HIGH (ref <8.0)

## 2022-02-18 MED ORDER — OXYCODONE-ACETAMINOPHEN 5-325 MG PO TABS: 1.0000 | ORAL_TABLET | ORAL | 0 refills | Status: DC | PRN

## 2022-02-18 NOTE — Telephone Encounter (Signed)
Rx was sent to the [facility] pharmacy on [address] today.

## 2022-02-18 NOTE — Telephone Encounter (Signed)
Patient was seen yesterday for an appointment, he stated his pain medication was supposed to be refilled. Nothing was received by his pharmacy.  Please advise

## 2022-02-22 ENCOUNTER — Ambulatory Visit (HOSPITAL_COMMUNITY): Admission: RE | Admit: 2022-02-22 | Payer: Managed Care, Other (non HMO) | Source: Ambulatory Visit

## 2022-02-24 ENCOUNTER — Ambulatory Visit (HOSPITAL_COMMUNITY): Payer: Managed Care, Other (non HMO)

## 2022-02-25 ENCOUNTER — Ambulatory Visit: Payer: Managed Care, Other (non HMO) | Admitting: Podiatry

## 2022-03-01 ENCOUNTER — Encounter: Payer: Managed Care, Other (non HMO) | Admitting: Podiatry

## 2022-03-03 ENCOUNTER — Ambulatory Visit (INDEPENDENT_AMBULATORY_CARE_PROVIDER_SITE_OTHER): Payer: Managed Care, Other (non HMO) | Admitting: Internal Medicine

## 2022-03-03 ENCOUNTER — Emergency Department (HOSPITAL_COMMUNITY): Payer: Managed Care, Other (non HMO)

## 2022-03-03 ENCOUNTER — Encounter: Payer: Self-pay | Admitting: Internal Medicine

## 2022-03-03 ENCOUNTER — Emergency Department (HOSPITAL_COMMUNITY)
Admission: EM | Admit: 2022-03-03 | Discharge: 2022-03-03 | Discharge status: Against Medical Advice | Payer: Managed Care, Other (non HMO) | Attending: Emergency Medicine | Admitting: Emergency Medicine

## 2022-03-03 ENCOUNTER — Other Ambulatory Visit: Payer: Self-pay

## 2022-03-03 VITALS — BP 163/100 | HR 98 | Resp 16 | Ht 71.0 in | Wt 160.0 lb

## 2022-03-03 DIAGNOSIS — Z5321 Procedure and treatment not carried out due to patient leaving prior to being seen by health care provider: Secondary | ICD-10-CM | POA: Insufficient documentation

## 2022-03-03 DIAGNOSIS — M79605 Pain in left leg: Secondary | ICD-10-CM

## 2022-03-03 DIAGNOSIS — M79662 Pain in left lower leg: Secondary | ICD-10-CM | POA: Diagnosis present

## 2022-03-03 DIAGNOSIS — M009 Pyogenic arthritis, unspecified: Secondary | ICD-10-CM

## 2022-03-03 DIAGNOSIS — M7989 Other specified soft tissue disorders: Secondary | ICD-10-CM | POA: Diagnosis not present

## 2022-03-03 LAB — BASIC METABOLIC PANEL
Anion gap: 11 (ref 5–15)
BUN: 15 mg/dL (ref 6–20)
CO2: 19 mmol/L — ABNORMAL LOW (ref 22–32)
Calcium: 9.4 mg/dL (ref 8.9–10.3)
Chloride: 104 mmol/L (ref 98–111)
Creatinine, Ser: 1.21 mg/dL (ref 0.61–1.24)
GFR, Estimated: >60 mL/min (ref >60)
Glucose, Bld: 86 mg/dL (ref 70–99)
Potassium: 4.4 mmol/L (ref 3.5–5.1)
Sodium: 134 mmol/L — ABNORMAL LOW (ref 135–145)

## 2022-03-03 LAB — CBC
HCT: 29 % — ABNORMAL LOW (ref 39.0–52.0)
Hemoglobin: 9 g/dL — ABNORMAL LOW (ref 13.0–17.0)
MCH: 24.7 pg — ABNORMAL LOW (ref 26.0–34.0)
MCHC: 31 g/dL (ref 30.0–36.0)
MCV: 79.7 fL — ABNORMAL LOW (ref 80.0–100.0)
Platelets: 538 10*3/uL — ABNORMAL HIGH (ref 150–400)
RBC: 3.64 MIL/uL — ABNORMAL LOW (ref 4.22–5.81)
RDW: 14.9 % (ref 11.5–15.5)
WBC: 8.1 10*3/uL (ref 4.0–10.5)
nRBC: 0 % (ref 0.0–0.2)

## 2022-03-03 NOTE — ED Notes (Signed)
PATIENT STATED THAT HE CALLED INFECTION DISEASE CLINIC AFTER HAVING ULTRASOUND HERE .SO HE LEFT

## 2022-03-03 NOTE — Progress Notes (Signed)
RN transported patient to [facility] Emergency Department via wheelchair for concern of blood clot in left lower leg. Patient checked in and handed off to EMT for triage.   Sandie Ano, RN

## 2022-03-03 NOTE — Progress Notes (Signed)
Left lower extremity venous duplex has been completed. Preliminary results can be found in CV Proc through chart review.  Results were given to Delice Bison PA.  03/03/22 1:05 PM Olen Cordial RVT

## 2022-03-03 NOTE — Progress Notes (Signed)
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[place]) 12/07/2016   Pain of left hip joint 12/07/2016   Pain of right hip joint 12/07/2016   SIRS (systemic inflammatory response syndrome) ([place]) 08/07/2014   Cardiomyopathy- EF 40-45% presumed secondary to acute illness 08/07/2014   CAP (community acquired pneumonia)    Right upper quadrant pain    Acute pericarditis 08/04/2014   Acute hyponatremia 08/04/2014   Protein-calorie malnutrition, severe ([place]) 08/04/2014   Sepsis ([place]) 08/03/2014   Legionella pneumonia ([place]) 08/03/2014   Rheumatoid arthritis ([place]) 08/03/2014   Chest pain 08/03/2014   Elevated troponin- not fel to be secondary to MI 08/03/2014   Nausea vomiting and diarrhea 08/03/2014   Cervical spondylosis without myelopathy 11/06/2012    Patient's Medications  New Prescriptions   No medications on file  Previous Medications   ACETAMINOPHEN (TYLENOL) 325 MG TABLET    Take 2 tablets (650 mg total) by mouth every 6 (six) hours as needed for mild pain (or Fever >/= 101).   AMLODIPINE (NORVASC) 10 MG TABLET    Take 1 tablet (10 mg total) by mouth daily.   ATORVASTATIN (LIPITOR) 40 MG TABLET    Take 1 tablet (40 mg total) by mouth daily.   CARVEDILOL (COREG) 3.125 MG TABLET    Take 1 tablet (3.125 mg total) by mouth 2 (two) times daily.   CHOLECALCIFEROL (VITAMIN D-3) 125 MCG (5000 UT) TABS     Take 1 tablet by mouth daily.   FERROUS SULFATE 325 (65 FE) MG TABLET    Take 1 tablet (325 mg total) by mouth daily with breakfast.   FOLIC ACID (FOLVITE) 1 MG TABLET    Take 1 tablet (1 mg total) by mouth daily.   GABAPENTIN (NEURONTIN) 300 MG CAPSULE    Take 1 capsule (300 mg total) by mouth once nightly at bedtime.   HYDROCORTISONE EX    Apply 1 application topically 2 (two) times daily as needed (for razor bumps/rash/itching.).   MINOCYCLINE (MINOCIN) 100 MG CAPSULE    Take 2 capsules (200 mg total) by mouth 2 (two) times daily for 28 days.   NITROGLYCERIN (NITROSTAT) 0.4 MG SL TABLET    Place 1 tablet (0.4 mg total) under the tongue every 5 (five) minutes as needed for chest pain.   ONDANSETRON (ZOFRAN) 4 MG TABLET    Take 1 tablet (4 mg total) by mouth every 6 (six) hours as needed for nausea.   OXYCODONE-ACETAMINOPHEN (PERCOCET) 5-325 MG TABLET    Take 1 tablet by mouth every 4 (four) hours as needed for severe pain.   SENNA-DOCUSATE (SENOKOT-S) 8.6-50 MG TABLET    Take 1 tablet by mouth at bedtime as needed for mild constipation.   SULFAMETHOXAZOLE-TRIMETHOPRIM (BACTRIM DS) 800-160 MG TABLET    Take 2 tablets by mouth every 8 (eight) hours for 28 days.  Modified  Medications   No medications on file  Discontinued Medications   No medications on file    Subjective: 56 year old male with past medical history as below presents for management of septic arthritis.  He was admitted to [facility] 7/05/31/14 for left ankle infection.  Followed by podiatry for left ankle infection.  He was seen at at Dr. Glynis Smiles office on 6/21 for you underwent left ankle aspiration, noted 5 cc of thick yellow fluid.  Per documentation cultures returned positive Stenotrophomonas maltophilia, prescribed doxycycline.  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  Taken to the OR on 7/21 for I&D, per op note no  purulence noted.  No purulent fluid.  Post lavage from bone biopsy sent.  Pathology showed no osteomyelitis/patient treated with 4 weeks of antibiotics septic arthritis.  As blood cultures growing sternal on 6/21 did not have sensitivities.  Abscess he was sent home on Bactrim and minocycline EOT 03/01/2022.  Dr. Marveen Reeks office, rheumatology, Dr. Methotrexate.  Follow-up health outpatient antibiotics. 01/28/2022: Seen by Dr. Juleen China continues Today 03/03/2022: Patient currently on antibiotics.  He notes he has pain under left calf x 4 days.  Reports foot feels better although not 100%, staples in place. Review of Systems: Review of Systems  All other systems reviewed and are negative.   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[place])    right   Exertional shortness of breath    "at times" (11/14/2012)   Hypercholesteremia    Hypertension    Pericarditis ~ 2009   Pneumonia    Rheumatoid arthritis(714.0)     Social History   Tobacco Use   Smoking status: Every Day    Packs/day: 0.50    Years: 37.00    Total pack years: 18.50    Types: Cigarettes   Smokeless tobacco: Never  Vaping Use   Vaping Use: Never used  Substance Use Topics   Alcohol use: Yes    Alcohol/week: 14.0 standard drinks of alcohol    Types: 14 Cans of beer per week    Comment: 2-3 12 ounce cans of beer per day   Drug use: Not Currently    Types: Marijuana    Comment: 12/13/2016 "nothing in years"    Family History  Problem Relation Age of Onset   Cancer Mother    Hypertension Mother    Stroke Mother    Diabetes Mother    Cirrhosis Father    Lupus Sister    Lupus Sister    Breast cancer Sister     Allergies  Allergen Reactions   Bee Venom Anaphylaxis   Shellfish Allergy Anaphylaxis and Hives    Health Maintenance  Topic Date Due   Zoster Vaccines- Shingrix (1 of 2) Never done   COLONOSCOPY (Pts 45-41yr Insurance coverage will need to be confirmed)  Never done    COVID-19 Vaccine (3 - Pfizer risk series) 11/29/2019   INFLUENZA VACCINE  02/22/2022   TETANUS/TDAP  11/16/2026   Hepatitis C Screening  Completed   HIV Screening  Completed   HPV VACCINES  Aged Out    Objective:  Vitals:   03/03/22 1030  Resp: 16  Weight: 160 lb (72.6 kg)  Height: 5' 11"  (1.803 m)   Body mass index is 22.32 kg/m.  Physical Exam Constitutional:      General: He is not in acute distress.    Appearance: He  is normal weight. He is not toxic-appearing.  HENT:     Head: Normocephalic and atraumatic.     Right Ear: External ear normal.     Left Ear: External ear normal.     Nose: No congestion or rhinorrhea.     Mouth/Throat:     Mouth: Mucous membranes are moist.     Pharynx: Oropharynx is clear.  Eyes:     Extraocular Movements: Extraocular movements intact.     Conjunctiva/sclera: Conjunctivae normal.     Pupils: Pupils are equal, round, and reactive to light.  Cardiovascular:     Rate and Rhythm: Normal rate and regular rhythm.     Heart sounds: No murmur heard.    No friction rub. No gallop.  Pulmonary:     Effort: Pulmonary effort is normal.     Breath sounds: Normal breath sounds.  Abdominal:     General: Abdomen is flat. Bowel sounds are normal.     Palpations: Abdomen is soft.  Musculoskeletal:        General: No swelling. Normal range of motion.     Cervical back: Normal range of motion and neck supple.  Skin:    General: Skin is warm and dry.  Neurological:     General: No focal deficit present.     Mental Status: He is oriented to person, place, and time.  Psychiatric:        Mood and Affect: Mood normal.     Lab Results Lab Results  Component Value Date   WBC 6.7 02/17/2022   HGB 8.0 (L) 02/17/2022   HCT 26.5 (L) 02/17/2022   MCV 83.1 02/17/2022   PLT 571 (H) 02/17/2022    Lab Results  Component Value Date   CREATININE 1.07 02/17/2022   BUN 13 02/17/2022   NA 134 (L) 02/17/2022   K 4.8 02/17/2022   CL 103 02/17/2022    CO2 24 02/17/2022    Lab Results  Component Value Date   ALT 15 02/05/2022   AST 15 02/05/2022   ALKPHOS 72 02/05/2022   BILITOT 0.5 02/05/2022    Lab Results  Component Value Date   CHOL 223 (H) 11/29/2019   HDL 46 11/29/2019   LDLCALC 166 (H) 11/29/2019   TRIG 63 11/29/2019   CHOLHDL 4.8 11/29/2019   No results found for: "LABRPR", "RPRTITER" No results found for: "HIV1RNAQUANT", "HIV1RNAVL", "CD4TABS"   A/P #Septic Arthritis of left ankle -bactrim and minocycline 7/13-p -Stop antibiotics, pt competed 4 weeks for septic arthritis Cx remain negative. Except for 1/4 [place] which is likely a contaminant -Continue to follow with podiatry -Follow-up with ID in one month to assess pt off of antibiotics. Labs ordered  for today cbc, cmp, esr, crp.    #Concern for DVT -Swelling and tenderness below left calf x 4 days -Pt sent to ED as concern for DVT LLE.  #RA -Follow -up with Rheum in Spetember.  -Holding MTX and prednisone  I spent more than 45 minutes for this patient encounter including reviewing data/chart, and coordinating care and >50% direct face to face time providing counseling/discussing diagnostics/treatment plan with patient   Laurice Record, [place] for Infectious Disease [facility] 03/03/2022, 10:34 AM

## 2022-03-03 NOTE — ED Provider Triage Note (Signed)
Emergency Medicine Provider Triage Evaluation Note  Dennis Harris , a 56 y.o. male  was evaluated in triage.  Pt complains of left lower extremity pain.  Patient reports he recently was admitted to the hospital for septic arthritis of his left ankle, had I&D procedure.  The patient states that he followed up today with infectious disease complaining of 4 days of left lower foot pain and swelling.  The patient states that his infectious disease provider was concerned about a blood clot.  The patient denies any chest pain, shortness of breath, fevers, nausea or vomiting.  Patient denies any history of blood clots.  Review of Systems  Positive:  Negative:   Physical Exam  There were no vitals taken for this visit. Gen:   Awake, no distress   Resp:  Normal effort  MSK:   Moves extremities without difficulty  Other:    Medical Decision Making  Medically screening exam initiated at 11:15 AM.  Appropriate orders placed.  AVIEN TAHA was informed that the remainder of the evaluation will be completed by another provider, this initial triage assessment does not replace that evaluation, and the importance of remaining in the ED until their evaluation is complete.     Al Decant, PA-C 03/03/22 1116

## 2022-03-03 NOTE — ED Triage Notes (Signed)
Pt sent for [place] of left leg to r/o DVT by infectious disease. Recent surgery for septic arthritis.

## 2022-03-04 ENCOUNTER — Telehealth: Payer: Self-pay

## 2022-03-04 NOTE — Telephone Encounter (Signed)
Patient scheduled for follow up and labs. Patient asking about [place] results and what his can take for his arthritis pain. Patient also would like to know if you were able  to speak with Dr. Ardelle Anton regarding pain medication. Lexy Meininger Jonathon Resides, CMA

## 2022-03-04 NOTE — Telephone Encounter (Signed)
-----   Message from Danelle Earthly, MD sent at 03/04/2022  5:45 AM EDT ----- Pt needs to stop in for labs. Needs follow-up in one month

## 2022-03-07 ENCOUNTER — Other Ambulatory Visit: Payer: Managed Care, Other (non HMO)

## 2022-03-07 ENCOUNTER — Other Ambulatory Visit: Payer: Self-pay

## 2022-03-07 DIAGNOSIS — M009 Pyogenic arthritis, unspecified: Secondary | ICD-10-CM

## 2022-03-07 NOTE — Telephone Encounter (Signed)
Spoke with patient and informed him he was negative for DVT. Would like to know if provider has spoke with Dr. Ardelle Anton about prescribing pain meds. Also would like to know if he would be okay to restart methotrexate. Juanita Laster, RMA

## 2022-03-07 NOTE — Addendum Note (Signed)
Addended by: Linna Hoff D on: 03/07/2022 09:20 AM   Modules accepted: Orders

## 2022-03-08 ENCOUNTER — Other Ambulatory Visit: Payer: Managed Care, Other (non HMO)

## 2022-03-08 ENCOUNTER — Ambulatory Visit (INDEPENDENT_AMBULATORY_CARE_PROVIDER_SITE_OTHER): Payer: Managed Care, Other (non HMO)

## 2022-03-08 ENCOUNTER — Other Ambulatory Visit: Payer: Self-pay

## 2022-03-08 ENCOUNTER — Ambulatory Visit (INDEPENDENT_AMBULATORY_CARE_PROVIDER_SITE_OTHER): Payer: Managed Care, Other (non HMO) | Admitting: Podiatry

## 2022-03-08 DIAGNOSIS — M25572 Pain in left ankle and joints of left foot: Secondary | ICD-10-CM | POA: Diagnosis not present

## 2022-03-08 DIAGNOSIS — M00072 Staphylococcal arthritis, left ankle and foot: Secondary | ICD-10-CM

## 2022-03-08 DIAGNOSIS — M009 Pyogenic arthritis, unspecified: Secondary | ICD-10-CM

## 2022-03-08 LAB — COMPREHENSIVE METABOLIC PANEL
AG Ratio: 0.5 (calc) — ABNORMAL LOW (ref 1.0–2.5)
ALT: 10 U/L (ref 9–46)
AST: 13 U/L (ref 10–35)
Albumin: 2.8 g/dL — ABNORMAL LOW (ref 3.6–5.1)
Alkaline phosphatase (APISO): 117 U/L (ref 35–144)
BUN: 12 mg/dL (ref 7–25)
CO2: 22 mmol/L (ref 20–32)
Calcium: 9 mg/dL (ref 8.6–10.3)
Chloride: 104 mmol/L (ref 98–110)
Creat: 1.02 mg/dL (ref 0.70–1.30)
Globulin: 5.3 g/dL (calc) — ABNORMAL HIGH (ref 1.9–3.7)
Glucose, Bld: 80 mg/dL (ref 65–99)
Potassium: 4.2 mmol/L (ref 3.5–5.3)
Sodium: 134 mmol/L — ABNORMAL LOW (ref 135–146)
Total Bilirubin: 0.2 mg/dL (ref 0.2–1.2)
Total Protein: 8.1 g/dL (ref 6.1–8.1)

## 2022-03-08 LAB — CBC WITH DIFFERENTIAL/PLATELET
Absolute Monocytes: 598 cells/uL (ref 200–950)
Basophils Absolute: 33 cells/uL (ref 0–200)
Basophils Relative: 0.5 %
Eosinophils Absolute: 72 cells/uL (ref 15–500)
Eosinophils Relative: 1.1 %
HCT: 27.8 % — ABNORMAL LOW (ref 38.5–50.0)
Hemoglobin: 8.4 g/dL — ABNORMAL LOW (ref 13.2–17.1)
Lymphs Abs: 1619 cells/uL (ref 850–3900)
MCH: 25 pg — ABNORMAL LOW (ref 27.0–33.0)
MCHC: 30.2 g/dL — ABNORMAL LOW (ref 32.0–36.0)
MCV: 82.7 fL (ref 80.0–100.0)
MPV: 8.9 fL (ref 7.5–12.5)
Monocytes Relative: 9.2 %
Neutro Abs: 4180 cells/uL (ref 1500–7800)
Neutrophils Relative %: 64.3 %
Platelets: 446 10*3/uL — ABNORMAL HIGH (ref 140–400)
RBC: 3.36 10*6/uL — ABNORMAL LOW (ref 4.20–5.80)
RDW: 13.8 % (ref 11.0–15.0)
Total Lymphocyte: 24.9 %
WBC: 6.5 10*3/uL (ref 3.8–10.8)

## 2022-03-08 LAB — C-REACTIVE PROTEIN: CRP: 65.4 mg/L — ABNORMAL HIGH (ref <8.0)

## 2022-03-08 LAB — SEDIMENTATION RATE: Sed Rate: >130 mm/h — ABNORMAL HIGH (ref 0–20)

## 2022-03-08 MED ORDER — OXYCODONE-ACETAMINOPHEN 7.5-325 MG PO TABS
1.0000 | ORAL_TABLET | Freq: Four times a day (QID) | ORAL | 0 refills | Status: DC | PRN
  Filled 2022-03-08: qty 15, 4d supply, fill #0

## 2022-03-08 NOTE — Progress Notes (Signed)
Subjective: Dennis Harris is a 56 y.o. is seen today in office s/p left ankle/foot I&D, bone biopsy preformed on 02/02/2022 with Dr. Allena Katz.  Still concern of the pain to his ankle he is having swelling.  He has been in the cam boot.  Asking for refill pain medication.  Denies any fevers or chills.  He had to miss his last appointment due to car issues.   Specimen Description WOUND   Special Requests POST LAVAGE SWAB OF TM JNT   Gram Stain RARE WBC PRESENT, PREDOMINANTLY PMN  NO ORGANISMS SEEN  Gram Stain Report Called to,Read Back By and Verified With: RN I. WISE [phone] @2050  FH   Culture No growth aerobically or anaerobically.  Performed at [facility], [address], [place], [place]   Report Status 02/07/2022 FINAL     Specimen Description WOUND   Special Requests PRELAVAGE SWAB OF TM JNT   Gram Stain MODERATE WBC PRESENT,BOTH PMN AND MONONUCLEAR  NO ORGANISMS SEEN  Gram Stain Report Called to,Read Back By and Verified With: RN I. WISE [phone] @2050  FH   Culture No growth aerobically or anaerobically.  Performed at [facility], [address] [place], [address] [place]   Report Status 02/07/2022 FINAL     Objective: General: No acute distress, AAOx3  DP/PT pulses palpable 2/4, CRT < 3 sec to all digits.  Protective sensation intact. Motor function intact.  Left foot: Incision is well coapted without any evidence of dehiscence with a few staples intact.  There is still edema present ankle but there is no significant erythema or warmth.  Ankle joint range of motion intact.  Mild diffuse tenderness however to the ankle.  There is no fluctuation or crepitation. No pain with calf compression, swelling, warmth, erythema.   Assessment and Plan:  Status post left foot incision and drainage, bone biopsy, doing well with no complications   -Treatment options discussed including all alternatives, risks, and complications -X-rays of the left ankle were  obtained.  3 views obtained.  No evidence of acute fracture.  Edema noted. -Given ongoing nature of her symptoms and will order a new MRI to further evaluate. -Continue cam boot, immobilization. -He is off of antibiotics at this time.  Discussed with his rheumatologist about restarting the medication for his arthritis -We will extend his FMLA. -There is any worsening signs or symptoms of infection or pain, swelling is reported to emergency room.  [id number] DPM

## 2022-03-09 ENCOUNTER — Other Ambulatory Visit: Payer: Self-pay

## 2022-03-10 ENCOUNTER — Encounter: Payer: Self-pay | Admitting: Podiatry

## 2022-03-11 LAB — FUNGUS CULTURE WITH STAIN

## 2022-03-11 LAB — FUNGUS CULTURE RESULT

## 2022-03-11 LAB — FUNGAL ORGANISM REFLEX

## 2022-03-15 ENCOUNTER — Telehealth: Payer: Self-pay | Admitting: Internal Medicine

## 2022-03-15 ENCOUNTER — Encounter: Payer: Managed Care, Other (non HMO) | Admitting: Podiatry

## 2022-03-15 NOTE — Telephone Encounter (Signed)
Patient called the office stating he has surgery back in July for septic arthritis in his ankle. Patient states he was taking antibiotics and finished those 2 weeks ago. Patient states the infectious disease doctor told him to ask Dr. Dimple Casey when he can resume his Methotrexate. Patient requests a call back.

## 2022-03-16 NOTE — Telephone Encounter (Signed)
Advised patient that Dr. Dimple Casey believes he should be fine to resume methotrexate at this time if he has been off of all antibiotics for 2 weeks.

## 2022-03-16 NOTE — Telephone Encounter (Signed)
I believe he should be fine to resume methotrexate at this time if he has been off of all antibiotics for 2 weeks.

## 2022-03-18 LAB — ACID FAST CULTURE WITH REFLEXED SENSITIVITIES (MYCOBACTERIA)
Acid Fast Culture: NEGATIVE
Acid Fast Culture: NEGATIVE

## 2022-03-19 ENCOUNTER — Ambulatory Visit
Admission: RE | Admit: 2022-03-19 | Discharge: 2022-03-19 | Disposition: A | Discharge status: Home / Self Care | Payer: Managed Care, Other (non HMO) | Source: Ambulatory Visit | Attending: Podiatry | Admitting: Podiatry

## 2022-03-19 DIAGNOSIS — M009 Pyogenic arthritis, unspecified: Secondary | ICD-10-CM

## 2022-03-19 MED ORDER — GADOBENATE DIMEGLUMINE 529 MG/ML IV SOLN
15.0000 mL | Freq: Once | INTRAVENOUS | Status: AC | PRN
  Administered 2022-03-19: 15 mL via INTRAVENOUS

## 2022-03-21 ENCOUNTER — Telehealth: Payer: Self-pay | Admitting: Podiatry

## 2022-03-21 NOTE — Telephone Encounter (Signed)
Patient would like a call back regarding the results of his MRI , he does not understand what they put in Mychart.

## 2022-03-22 ENCOUNTER — Other Ambulatory Visit: Payer: Self-pay

## 2022-03-22 ENCOUNTER — Inpatient Hospital Stay (HOSPITAL_COMMUNITY)
Admission: EM | Admit: 2022-03-22 | Discharge: 2022-03-25 | DRG: 478 | Disposition: A | Discharge status: Home / Self Care | Payer: Managed Care, Other (non HMO) | Attending: Family Medicine | Admitting: Family Medicine

## 2022-03-22 ENCOUNTER — Encounter (HOSPITAL_COMMUNITY): Payer: Self-pay

## 2022-03-22 DIAGNOSIS — I10 Essential (primary) hypertension: Secondary | ICD-10-CM | POA: Diagnosis not present

## 2022-03-22 DIAGNOSIS — M19072 Primary osteoarthritis, left ankle and foot: Secondary | ICD-10-CM | POA: Diagnosis present

## 2022-03-22 DIAGNOSIS — Z981 Arthrodesis status: Secondary | ICD-10-CM

## 2022-03-22 DIAGNOSIS — Z8249 Family history of ischemic heart disease and other diseases of the circulatory system: Secondary | ICD-10-CM

## 2022-03-22 DIAGNOSIS — Z720 Tobacco use: Secondary | ICD-10-CM | POA: Diagnosis present

## 2022-03-22 DIAGNOSIS — D509 Iron deficiency anemia, unspecified: Secondary | ICD-10-CM | POA: Diagnosis present

## 2022-03-22 DIAGNOSIS — M869 Osteomyelitis, unspecified: Secondary | ICD-10-CM | POA: Diagnosis present

## 2022-03-22 DIAGNOSIS — D638 Anemia in other chronic diseases classified elsewhere: Secondary | ICD-10-CM | POA: Diagnosis present

## 2022-03-22 DIAGNOSIS — I110 Hypertensive heart disease with heart failure: Secondary | ICD-10-CM | POA: Diagnosis present

## 2022-03-22 DIAGNOSIS — F17210 Nicotine dependence, cigarettes, uncomplicated: Secondary | ICD-10-CM | POA: Diagnosis present

## 2022-03-22 DIAGNOSIS — E7800 Pure hypercholesterolemia, unspecified: Secondary | ICD-10-CM | POA: Diagnosis present

## 2022-03-22 DIAGNOSIS — Z79631 Long term (current) use of antimetabolite agent: Secondary | ICD-10-CM

## 2022-03-22 DIAGNOSIS — Z716 Tobacco abuse counseling: Secondary | ICD-10-CM

## 2022-03-22 DIAGNOSIS — Z91013 Allergy to seafood: Secondary | ICD-10-CM

## 2022-03-22 DIAGNOSIS — I5020 Unspecified systolic (congestive) heart failure: Secondary | ICD-10-CM | POA: Diagnosis present

## 2022-03-22 DIAGNOSIS — I428 Other cardiomyopathies: Secondary | ICD-10-CM | POA: Diagnosis present

## 2022-03-22 DIAGNOSIS — M05772 Rheumatoid arthritis with rheumatoid factor of left ankle and foot without organ or systems involvement: Secondary | ICD-10-CM

## 2022-03-22 DIAGNOSIS — E785 Hyperlipidemia, unspecified: Secondary | ICD-10-CM | POA: Diagnosis present

## 2022-03-22 DIAGNOSIS — Z79899 Other long term (current) drug therapy: Secondary | ICD-10-CM

## 2022-03-22 DIAGNOSIS — M069 Rheumatoid arthritis, unspecified: Secondary | ICD-10-CM | POA: Diagnosis present

## 2022-03-22 DIAGNOSIS — M009 Pyogenic arthritis, unspecified: Secondary | ICD-10-CM | POA: Diagnosis not present

## 2022-03-22 DIAGNOSIS — M25472 Effusion, left ankle: Secondary | ICD-10-CM | POA: Diagnosis present

## 2022-03-22 DIAGNOSIS — Z96641 Presence of right artificial hip joint: Secondary | ICD-10-CM | POA: Diagnosis present

## 2022-03-22 DIAGNOSIS — I5022 Chronic systolic (congestive) heart failure: Secondary | ICD-10-CM | POA: Diagnosis present

## 2022-03-22 DIAGNOSIS — Z91030 Bee allergy status: Secondary | ICD-10-CM

## 2022-03-22 LAB — CBC WITH DIFFERENTIAL/PLATELET
Abs Immature Granulocytes: 0.03 10*3/uL (ref 0.00–0.07)
Basophils Absolute: 0 10*3/uL (ref 0.0–0.1)
Basophils Relative: 1 %
Eosinophils Absolute: 0 10*3/uL (ref 0.0–0.5)
Eosinophils Relative: 0 %
HCT: 33.4 % — ABNORMAL LOW (ref 39.0–52.0)
Hemoglobin: 10.5 g/dL — ABNORMAL LOW (ref 13.0–17.0)
Immature Granulocytes: 0 %
Lymphocytes Relative: 16 %
Lymphs Abs: 1.4 10*3/uL (ref 0.7–4.0)
MCH: 24.9 pg — ABNORMAL LOW (ref 26.0–34.0)
MCHC: 31.4 g/dL (ref 30.0–36.0)
MCV: 79.3 fL — ABNORMAL LOW (ref 80.0–100.0)
Monocytes Absolute: 0.5 10*3/uL (ref 0.1–1.0)
Monocytes Relative: 6 %
Neutro Abs: 6.4 10*3/uL (ref 1.7–7.7)
Neutrophils Relative %: 77 %
Platelets: 460 10*3/uL — ABNORMAL HIGH (ref 150–400)
RBC: 4.21 MIL/uL — ABNORMAL LOW (ref 4.22–5.81)
RDW: 16.2 % — ABNORMAL HIGH (ref 11.5–15.5)
WBC: 8.4 10*3/uL (ref 4.0–10.5)
nRBC: 0 % (ref 0.0–0.2)

## 2022-03-22 LAB — BASIC METABOLIC PANEL
Anion gap: 10 (ref 5–15)
BUN: 8 mg/dL (ref 6–20)
CO2: 22 mmol/L (ref 22–32)
Calcium: 9.3 mg/dL (ref 8.9–10.3)
Chloride: 103 mmol/L (ref 98–111)
Creatinine, Ser: 0.99 mg/dL (ref 0.61–1.24)
GFR, Estimated: >60 mL/min (ref >60)
Glucose, Bld: 111 mg/dL — ABNORMAL HIGH (ref 70–99)
Potassium: 3.9 mmol/L (ref 3.5–5.1)
Sodium: 135 mmol/L (ref 135–145)

## 2022-03-22 LAB — C-REACTIVE PROTEIN: CRP: 6.2 mg/dL — ABNORMAL HIGH (ref <1.0)

## 2022-03-22 LAB — ACID FAST CULTURE WITH REFLEXED SENSITIVITIES (MYCOBACTERIA)
Acid Fast Culture: NEGATIVE
Acid Fast Culture: NEGATIVE

## 2022-03-22 LAB — SEDIMENTATION RATE: Sed Rate: 105 mm/hr — ABNORMAL HIGH (ref 0–16)

## 2022-03-22 MED ORDER — FOLIC ACID 1 MG PO TABS
1.0000 mg | ORAL_TABLET | Freq: Every day | ORAL | Status: DC
  Administered 2022-03-24 – 2022-03-25 (×2): 1 mg via ORAL
  Filled 2022-03-22 (×2): qty 1

## 2022-03-22 MED ORDER — FOLIC ACID 1 MG PO TABS
1.0000 mg | ORAL_TABLET | Freq: Every day | ORAL | Status: DC
Start: 2022-03-22 — End: 2022-03-22

## 2022-03-22 MED ORDER — ACETAMINOPHEN 500 MG PO TABS
1000.0000 mg | ORAL_TABLET | Freq: Four times a day (QID) | ORAL | Status: DC
  Administered 2022-03-23 – 2022-03-25 (×6): 1000 mg via ORAL
  Filled 2022-03-22 (×6): qty 2

## 2022-03-22 MED ORDER — GABAPENTIN 300 MG PO CAPS
600.0000 mg | ORAL_CAPSULE | Freq: Two times a day (BID) | ORAL | Status: DC
Start: 2022-03-22 — End: 2022-03-22

## 2022-03-22 MED ORDER — MORPHINE SULFATE (PF) 2 MG/ML IV SOLN
1.0000 mg | INTRAVENOUS | Status: DC | PRN
  Administered 2022-03-22 – 2022-03-24 (×4): 1 mg via INTRAVENOUS
  Filled 2022-03-22 (×4): qty 1

## 2022-03-22 MED ORDER — ATORVASTATIN CALCIUM 40 MG PO TABS
40.0000 mg | ORAL_TABLET | Freq: Every day | ORAL | Status: DC
  Administered 2022-03-24 – 2022-03-25 (×2): 40 mg via ORAL
  Filled 2022-03-22 (×2): qty 1

## 2022-03-22 MED ORDER — CARVEDILOL 3.125 MG PO TABS
3.1250 mg | ORAL_TABLET | Freq: Two times a day (BID) | ORAL | Status: DC
  Administered 2022-03-22 – 2022-03-25 (×5): 3.125 mg via ORAL
  Filled 2022-03-22 (×5): qty 1

## 2022-03-22 MED ORDER — AMLODIPINE BESYLATE 10 MG PO TABS
10.0000 mg | ORAL_TABLET | Freq: Every day | ORAL | Status: DC
  Administered 2022-03-24 – 2022-03-25 (×2): 10 mg via ORAL
  Filled 2022-03-22 (×2): qty 1

## 2022-03-22 MED ORDER — ONDANSETRON HCL 4 MG PO TABS: 4.0000 mg | ORAL_TABLET | Freq: Four times a day (QID) | ORAL | Status: DC | PRN

## 2022-03-22 MED ORDER — IBUPROFEN 600 MG PO TABS
600.0000 mg | ORAL_TABLET | Freq: Four times a day (QID) | ORAL | Status: DC
  Administered 2022-03-22 – 2022-03-24 (×6): 600 mg via ORAL
  Filled 2022-03-22 (×6): qty 1

## 2022-03-22 MED ORDER — GABAPENTIN 300 MG PO CAPS
600.0000 mg | ORAL_CAPSULE | Freq: Every day | ORAL | Status: DC
  Administered 2022-03-22 – 2022-03-24 (×3): 600 mg via ORAL
  Filled 2022-03-22 (×3): qty 2

## 2022-03-22 MED ORDER — TRAMADOL HCL 50 MG PO TABS
50.0000 mg | ORAL_TABLET | Freq: Four times a day (QID) | ORAL | Status: DC
  Administered 2022-03-22 – 2022-03-24 (×6): 50 mg via ORAL
  Filled 2022-03-22 (×6): qty 1

## 2022-03-22 MED ORDER — SENNOSIDES-DOCUSATE SODIUM 8.6-50 MG PO TABS: 1.0000 | ORAL_TABLET | Freq: Every evening | ORAL | Status: DC | PRN

## 2022-03-22 NOTE — ED Provider Notes (Signed)
[facility] EMERGENCY DEPARTMENT Provider Note   CSN: [id number] Arrival date & time: 03/22/22  1152     History  Chief Complaint  Patient presents with   Joint Swelling    Dennis Harris is a 56 y.o. male.  With a history of rheumatoid arthritis, hypertension recent I&D of left ankle on 02/02/2022.  Patient was in contact with his surgeon Dr. Jacqualyn Posey on 03/08/2022 for reevaluation.  MRI was ordered, patient had this on 03/19/2022.  Final results showed developing osteomyelitis, tenosynovitis.  Patient was contacted and told to report to the ED. surgery tentatively scheduled for tomorrow with Dr. Blenda Mounts  Today presents to the ED for evaluation of increased swelling and pain.  Relevant history as above.  Specifically denies fevers, chills, body aches, chest pain, shortness of breath, abdominal pain.  Left ankle pain is a 10/10 at rest and has not been able to weight-bear for the past 3 days.  Describes the pain as a constant throb with intermittent sharp shooting pain.  Patient states he has tried taking his Norco, but this is nothing for his pain.  He states he has been taking 800 mg ibuprofen 3 times per day and states that this helps get him through the day. HPI     Home Medications Prior to Admission medications   Medication Sig Start Date End Date Taking? Authorizing Provider  acetaminophen (TYLENOL) 325 MG tablet Take 2 tablets (650 mg total) by mouth every 6 (six) hours as needed for mild pain (or Fever >/= 101). 02/05/22   Raiford Noble Latif, DO  amLODipine (NORVASC) 10 MG tablet Take 1 tablet (10 mg total) by mouth daily. 02/05/22   Raiford Noble Latif, DO  atorvastatin (LIPITOR) 40 MG tablet Take 1 tablet (40 mg total) by mouth daily. Patient not taking: Reported on 11/22/2021 12/31/19 03/30/20  Lorretta Harp, MD  carvedilol (COREG) 3.125 MG tablet Take 1 tablet (3.125 mg total) by mouth 2 (two) times daily. Patient not taking: Reported on 11/22/2021 12/31/19  03/30/20  Lorretta Harp, MD  Cholecalciferol (VITAMIN D-3) 125 MCG (5000 UT) TABS Take 1 tablet by mouth daily. 01/13/20   Hilts, Legrand Como, MD  ferrous sulfate 325 (65 FE) MG tablet Take 1 tablet (325 mg total) by mouth daily with breakfast. 11/20/19   Kerin Perna, NP  folic acid (FOLVITE) 1 MG tablet Take 1 tablet (1 mg total) by mouth daily. 11/22/21   Rice, [name], MD  gabapentin (NEURONTIN) 300 MG capsule Take 1 capsule (300 mg total) by mouth once nightly at bedtime. 01/04/22   Trula Slade, DPM  HYDROCORTISONE EX Apply 1 application topically 2 (two) times daily as needed (for razor bumps/rash/itching.).    [provider]  nitroGLYCERIN (NITROSTAT) 0.4 MG SL tablet Place 1 tablet (0.4 mg total) under the tongue every 5 (five) minutes as needed for chest pain. 11/20/19   Kerin Perna, NP  ondansetron (ZOFRAN) 4 MG tablet Take 1 tablet (4 mg total) by mouth every 6 (six) hours as needed for nausea. 02/05/22   Raiford Noble Latif, DO  oxyCODONE-acetaminophen (PERCOCET) 5-325 MG tablet Take 1 tablet by mouth every 4 (four) hours as needed for severe pain. 02/18/22   Felipa Furnace, DPM  oxyCODONE-acetaminophen (PERCOCET) 7.5-325 MG tablet Take 1 tablet by mouth every 6 (six) hours as needed for severe pain. 03/08/22   Trula Slade, DPM  senna-docusate (SENOKOT-S) 8.6-50 MG tablet Take 1 tablet by mouth at bedtime as  needed for mild constipation. 02/05/22   Raiford Noble Latif, DO      Allergies    Bee venom and Shellfish allergy    Review of Systems   Review of Systems  Constitutional:  Negative for appetite change, chills, diaphoresis, fatigue, fever and unexpected weight change.  Respiratory:  Negative for cough, chest tightness and shortness of breath.   Cardiovascular:  Positive for leg swelling. Negative for chest pain and palpitations.  Gastrointestinal:  Negative for abdominal pain, nausea and vomiting.  Neurological:  Negative for dizziness,  weakness, light-headedness and numbness.    Physical Exam Updated Vital Signs BP (!) 150/83 (BP Location: Right Arm)   Pulse 85   Temp 98 F (36.7 C) (Oral)   Resp 16   Ht _0  (1.803 m)   Wt 72.6 kg   SpO2 99%   BMI 22.32 kg/m  Physical Exam Vitals and nursing note reviewed.  Constitutional:      General: He is not in acute distress.    Appearance: Normal appearance. He is normal weight. He is not ill-appearing.  HENT:     Head: Normocephalic and atraumatic.  Cardiovascular:     Rate and Rhythm: Normal rate and regular rhythm.     Pulses:          Radial pulses are 2+ on the right side and 2+ on the left side.       Dorsalis pedis pulses are 2+ on the right side and 1+ on the left side.     Heart sounds: Normal heart sounds.     Comments: Surgical scars noted to left ankle.  Healing well.  Significant amount of soft tissue swelling.  Very painful to light palpation.  Decreased range of motion secondary to pain.  Warm to the touch. Pulmonary:     Effort: Pulmonary effort is normal. No respiratory distress.     Breath sounds: Normal breath sounds. No wheezing.  Abdominal:     General: Abdomen is flat.     Palpations: Abdomen is soft.  Musculoskeletal:     Cervical back: Neck supple.     Right lower leg: No edema.     Left lower leg: Edema present.     Left foot: Decreased range of motion.  Feet:     Right foot:     Toenail Condition: Right toenails are abnormally thick.     Left foot:     Skin integrity: Warmth present.     Toenail Condition: Left toenails are abnormally thick.  Skin:    General: Skin is warm and dry.     Capillary Refill: Capillary refill takes less than 2 seconds.  Neurological:     Mental Status: He is alert and oriented to person, place, and time.  Psychiatric:        Mood and Affect: Mood normal.        Behavior: Behavior normal.     ED Results / Procedures / Treatments   Labs (all labs ordered are listed, but only abnormal results are  displayed) Labs Reviewed  CBC WITH DIFFERENTIAL/PLATELET - Abnormal; Notable for the following components:      Result Value   RBC 4.21 (*)    Hemoglobin 10.5 (*)    HCT 33.4 (*)    MCV 79.3 (*)    MCH 24.9 (*)    RDW 16.2 (*)    Platelets 460 (*)    All other components within normal limits  BASIC METABOLIC PANEL - Abnormal; Notable  for the following components:   Glucose, Bld 111 (*)    All other components within normal limits  SEDIMENTATION RATE - Abnormal; Notable for the following components:   Sed Rate 105 (*)    All other components within normal limits  C-REACTIVE PROTEIN - Abnormal; Notable for the following components:   CRP 6.2 (*)    All other components within normal limits  CULTURE, BLOOD (ROUTINE X 2)  CULTURE, BLOOD (ROUTINE X 2)    EKG None  Radiology No results found.  Procedures Procedures    Medications Ordered in ED Medications - No data to display  ED Course/ Medical Decision Making/ A&P Clinical Course as of 03/22/22 1923  Tue Mar 22, 2022  1833 Spoke with hospitalist Dr. Precious Haws who accepts admission to night hospitalist team [AS]    Clinical Course User Index [AS] Roylene Reason, PA-C   This patient presents to the ED for concern of left ankle osteomyelitis s/p I&D on 02/02/2022, this involves an extensive number of treatment options, and is a complaint that carries with it a high risk of complications and morbidity.  The differential diagnosis includes osteomyelitis, tenosynovitis, strain, sprain   Co morbidities that complicate the patient evaluation  Rheumatoid arthritis, hypertension  My initial workup includes CBC, CMP, ESR, CRP, blood cultures to assess for inflammation and infection  Additional history obtained from: Nursing notes from this visit. Prior ED visit on 01/31/2022 for septic arthritis of the left ankle Previous records within EMR system 03/03/2022 and 03/08/2022 for follow-up of septic arthritis, MRI ordered,  patient advised to return to ED for surgery tomorrow  I ordered, reviewed and interpreted labs which include: CBC, CMP, ESR, CRP, blood cultures.  ESR elevated at 105, CRP elevated at 6.2, chronic anemia with stable hemoglobin of 10.5  Consultations Obtained:  I requested consultation with the Podiatry Dr. Blenda Mounts ,  and discussed lab and imaging findings as well as pertinent plan - they recommend: Admission to hospitalist, no antibiotics at this time  Hospitalist Dr. Nita Sells accepts admission  Afebrile, hemodynamically stable with blood pressures in the upper 140s over low 80s.  Patient has no complaints outside of his left ankle to suggest sepsis.  MRI showed osteomyelitis, tenosynovitis, inflammatory changes.  Scheduled for I&D with Dr. Blenda Mounts tomorrow.    Patient's case discussed with Dr. Sabra Heck who agrees with plan to discharge with follow-up.   Note: Portions of this report may have been transcribed using voice recognition software. Every effort was made to ensure accuracy; however, inadvertent computerized transcription errors may still be present.                         Medical Decision Making         Final Clinical Impression(s) / ED Diagnoses Final diagnoses:  None    Rx / DC Orders ED Discharge Orders     None         Nehemiah Massed 03/22/22 Patrecia Pour    Noemi Chapel, MD 03/22/22 2337

## 2022-03-22 NOTE — Assessment & Plan Note (Deleted)
Severe left ankle pain, worsening in the last week.   Patient is unable to bear weight.  MRI outpatient showed osteomyelitis and concern for septic arthritis in the ankle joint.  Patient completed 4-week course of Bactrim and minocycline.  Patient is afebrile, WBC WNL. - Admit to FM TS, attending Dr. Burley Saver - Podiatry consulted, surgery planned tomorrow - N.p.o. midnight - Pain control: Gabapentin 600 mg twice daily, Tylenol 1000 mg q6h, ibuprofen 600 mg every 6 hours, and tramadol 50 mg  - Blood cultures pending - Wound cultures to be obtained during surgery tomorrow

## 2022-03-22 NOTE — ED Notes (Signed)
ED TO INPATIENT HANDOFF REPORT  ED Nurse Name and Phone #: Lenell Antu Name/Age/Gender Dennis Harris 56 y.o. male Room/Bed: 014C/014C  Code Status   Code Status: Full Code  Home/SNF/Other Home Patient oriented to: self, place, time, and situation Is this baseline? Yes   Triage Complete: Triage complete  Chief Complaint Septic arthritis (HCC) [M00.9] Osteomyelitis (HCC) [M86.9]  Triage Note Has surgery on left ankle for septic arthritis and was told to come back bc still having swelling and will need surgery again.    Allergies Allergies  Allergen Reactions   Bee Venom Anaphylaxis   Shellfish Allergy Anaphylaxis and Hives    Level of Care/Admitting Diagnosis ED Disposition     ED Disposition  Admit   Condition  --   Comment  Hospital Area: [facility] [[id number]]  Level of Care: Telemetry Medical [104]  May place patient in observation at [facility] or [facility] if equivalent level of care is available:: Yes  Covid Evaluation: Asymptomatic - no recent exposure (last 10 days) testing not required  Diagnosis: Osteomyelitis [facility]) [[id number]]  Admitting Physician: Glendale Chard [[id number]]  Attending Physician: Billey Co [[id number]]          B Medical/Surgery History 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   Exertional shortness of breath    "at times" (11/14/2012)   Hypercholesteremia    Hypertension    Pericarditis ~ 2009   Pneumonia    Rheumatoid arthritis(714.0)    Past Surgical History:  Procedure Laterality Date   ANTERIOR CERVICAL DECOMP/DISCECTOMY FUSION N/A 11/14/2012   Procedure: ANTERIOR CERVICAL DECOMPRESSION/DISCECTOMY FUSION 1 LEVEL C5-6;  Surgeon: Venita Lick, MD;  Location: [facility] OR;  Service: Orthopedics;  Laterality: N/A;   APPENDECTOMY  1970's   BACK SURGERY     CARDIAC CATHETERIZATION  May 2010   normal   I & D EXTREMITY Left 02/02/2022   Procedure: IRRIGATION  AND DEBRIDEMENT LEFT FOOT/ANKLE;  Surgeon: Candelaria Stagers, DPM;  Location: [facility] OR;  Service: Podiatry;  Laterality: Left;   JOINT REPLACEMENT     TONSILLECTOMY  1970's?   TOTAL HIP ARTHROPLASTY Right 12/13/2016   TOTAL HIP ARTHROPLASTY Right 12/13/2016   Procedure: RIGHT TOTAL HIP ARTHROPLASTY ANTERIOR APPROACH;  Surgeon: Kathryne Hitch, MD;  Location: [facility] OR;  Service: Orthopedics;  Laterality: Right;     A IV Location/Drains/Wounds Patient Lines/Drains/Airways Status     Active Line/Drains/Airways     Name Placement date Placement time Site Days   Peripheral IV 03/22/22 20 G Anterior;Right Forearm 03/22/22  1844  Forearm  less than 1   Incision (Closed) 12/13/16 Hip Right 12/13/16  1406  -- 1925   Incision (Closed) 02/02/22 Foot Left 02/02/22  1727  -- 48            Intake/Output Last 24 hours No intake or output data in the 24 hours ending 03/22/22 2159  Labs/Imaging Results for orders placed or performed during the hospital encounter of 03/22/22 (from the past 48 hour(s))  CBC with Differential     Status: Abnormal   Collection Time: 03/22/22  1:23 PM  Result Value Ref Range   WBC 8.4 4.0 - 10.5 K/uL   RBC 4.21 (L) 4.22 - 5.81 MIL/uL   Hemoglobin 10.5 (L) 13.0 - 17.0 g/dL   HCT 61.6 (L) 07.3 - 71.0 %   MCV 79.3 (L) 80.0 - 100.0 fL   MCH  24.9 (L) 26.0 - 34.0 pg   MCHC 31.4 30.0 - 36.0 g/dL   RDW 34.1 (H) 93.7 - 90.2 %   Platelets 460 (H) 150 - 400 K/uL   nRBC 0.0 0.0 - 0.2 %   Neutrophils Relative % 77 %   Neutro Abs 6.4 1.7 - 7.7 K/uL   Lymphocytes Relative 16 %   Lymphs Abs 1.4 0.7 - 4.0 K/uL   Monocytes Relative 6 %   Monocytes Absolute 0.5 0.1 - 1.0 K/uL   Eosinophils Relative 0 %   Eosinophils Absolute 0.0 0.0 - 0.5 K/uL   Basophils Relative 1 %   Basophils Absolute 0.0 0.0 - 0.1 K/uL   Immature Granulocytes 0 %   Abs Immature Granulocytes 0.03 0.00 - 0.07 K/uL    Comment: Performed at [facility], [address], [place], [place] [address]   Basic metabolic panel     Status: Abnormal   Collection Time: 03/22/22  1:23 PM  Result Value Ref Range   Sodium 135 135 - 145 mmol/L   Potassium 3.9 3.5 - 5.1 mmol/L   Chloride 103 98 - 111 mmol/L   CO2 22 22 - 32 mmol/L   Glucose, Bld 111 (H) 70 - 99 mg/dL    Comment: Glucose reference range applies only to samples taken after fasting for at least 8 hours.   BUN 8 6 - 20 mg/dL   Creatinine, Ser 5.32 0.61 - 1.24 mg/dL   Calcium 9.3 8.9 - 99.2 mg/dL   GFR, Estimated >42 >68 mL/min    Comment: (NOTE) Calculated using the CKD-EPI Creatinine Equation (2021)    Anion gap 10 5 - 15    Comment: Performed at [facility], [address], [place], [place] [address]  Sedimentation rate     Status: Abnormal   Collection Time: 03/22/22  1:23 PM  Result Value Ref Range   Sed Rate 105 (H) 0 - 16 mm/hr    Comment: Performed at [facility], [address] [place], [place] [address]  C-reactive protein     Status: Abnormal   Collection Time: 03/22/22  1:23 PM  Result Value Ref Range   CRP 6.2 (H) <1.0 mg/dL    Comment: Performed at [facility], [address] [place], [place] [address]   No results found.  Pending Labs Unresulted Labs (From admission, onward)     Start     Ordered   03/23/22 0500  Basic metabolic panel  Tomorrow morning,   R        03/22/22 2115   03/23/22 0500  CBC  Tomorrow morning,   R        03/22/22 2115   03/23/22 0500  Lipid panel  Tomorrow morning,   R        03/22/22 2119   03/22/22 1723  Culture, blood (routine x 2)  BLOOD CULTURE X 2,   R (with STAT occurrences)      03/22/22 1722            Vitals/Pain Today's Vitals   03/22/22 1352 03/22/22 1651 03/22/22 1841 03/22/22 2130  BP: (!) 145/85 (!) 150/83 (!) 189/108 (!) 163/85  Pulse: 99 85 92 (!) 109  Resp: 15 16 16  (!) 36  Temp: 98 F (36.7 C) 98 F (36.7 C) 98.3 F (36.8 C)   TempSrc: Oral Oral Oral   SpO2: 99% 99% 95% 98%  Weight:      Height:  PainSc:   9       Isolation Precautions No active isolations  Medications Medications  acetaminophen (TYLENOL) tablet 1,000 mg (has no administration in time range)  ibuprofen (ADVIL) tablet 600 mg (has no administration in time range)  amLODipine (NORVASC) tablet 10 mg (has no administration in time range)  carvedilol (COREG) tablet 3.125 mg (has no administration in time range)  ondansetron (ZOFRAN) tablet 4 mg (has no administration in time range)  senna-docusate (Senokot-S) tablet 1 tablet (has no administration in time range)  traMADol (ULTRAM) tablet 50 mg (has no administration in time range)  folic acid (FOLVITE) tablet 1 mg (has no administration in time range)  atorvastatin (LIPITOR) tablet 40 mg (has no administration in time range)  gabapentin (NEURONTIN) capsule 600 mg (has no administration in time range)    Mobility walks Low fall risk   Focused Assessments    R Recommendations: See Admitting Provider Note  Report given to:   Additional Notes:

## 2022-03-22 NOTE — ED Provider Triage Note (Signed)
Emergency Medicine Provider Triage Evaluation Note  Dennis Harris , a 56 y.o. male  was evaluated in triage.  Pt complains of septic arthritis.  Had surgery in mid July and recently followed up with orthopedics who said that he needed another surgery after his most recent MRI.  Reportedly to be admitted by Dr. Loreta Ave with podiatry  Review of Systems  Positive: Right foot pain and swelling Negative: Fevers, chills  Physical Exam  BP (!) 149/86 (BP Location: Left Arm)   Pulse (!) 103   Temp 98 F (36.7 C)   Resp 16   Ht 5\' 11"  (1.803 m)   Wt 72.6 kg   SpO2 98%   BMI 22.32 kg/m  Gen:   Awake, no distress   Resp:  Normal effort  MSK:   Moves extremities without difficulty  Other:  Present right DP pulse.  Ankle warm and swollen.  No overlying cellulitis.  Well-healed scar.  Medical Decision Making  Medically screening exam initiated at 1:07 PM.  Appropriate orders placed.  Dennis Harris was informed that the remainder of the evaluation will be completed by another provider, this initial triage assessment does not replace that evaluation, and the importance of remaining in the ED until their evaluation is complete.     [name], PA-C 03/22/22 1308

## 2022-03-22 NOTE — Assessment & Plan Note (Signed)
Chronic. Most recent Hb 9.7. Suspect mixed picture of anemia of chronic disease and iron deficiency. Recent iron studies show iron 32, sat ratio 11%. - Hold off on iron supplementation during current infection - Recommend outpatient colorectal cancer screening

## 2022-03-22 NOTE — Assessment & Plan Note (Addendum)
Home meds previously included: chlorthalidone and losartan.  He was stopped on his medications and started on 10 mg amlodipine last admission due to AKI and ongoing Bactrim use.  He was severely hypertensive on admission likely due to pain, currently normotensive.  -- Started losartan  - Continue amlodipine  - Continue Coreg  - Monitor pain, BP and renal function, consider restarting chlorthalidone

## 2022-03-22 NOTE — Assessment & Plan Note (Addendum)
Chronic.  Managed by rheumatology.  Methotrexate was stopped previously due to kidney function concerns during antibiotics.  Methotrexate dose taken 8/29, taken weekly. Also on prednisone.  - Follow-up outpatient with rheumatology for medication management - Continue folic acid

## 2022-03-22 NOTE — Consult Note (Signed)
Subjective:  Patient ID: Dennis Harris, male    DOB: 1965-11-18,  MRN: [id number]  Patient with past medical history of RA and previous septic arthritis of the TN joint on the left foot seen at beside today for concern of septic arthritis of the ankle. Dr. Jacqualyn Posey has been following patient and recently underwent MRI for continued pain in the ankle which showed concern for new infection and septic arthritis. Sent to the ED for further work-up and I&D of the left ankle.  Patient relates continued pain in the left ankle. Denies n/v/f/c.   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[place])    right   Exertional shortness of breath    "at times" (11/14/2012)   Hypercholesteremia    Hypertension    Pericarditis ~ 2009   Pneumonia    Rheumatoid arthritis(714.0)      Past Surgical History:  Procedure Laterality Date   ANTERIOR CERVICAL DECOMP/DISCECTOMY FUSION N/A 11/14/2012   Procedure: ANTERIOR CERVICAL DECOMPRESSION/DISCECTOMY FUSION 1 LEVEL C5-6;  Surgeon: Melina Schools, MD;  Location: [facility];  Service: Orthopedics;  Laterality: N/A;   APPENDECTOMY  1970's   BACK SURGERY     CARDIAC CATHETERIZATION  May 2010   normal   I & D EXTREMITY Left 02/02/2022   Procedure: IRRIGATION AND DEBRIDEMENT LEFT FOOT/ANKLE;  Surgeon: Felipa Furnace, DPM;  Location: [facility];  Service: Podiatry;  Laterality: Left;   JOINT REPLACEMENT     TONSILLECTOMY  1970's?   TOTAL HIP ARTHROPLASTY Right 12/13/2016   TOTAL HIP ARTHROPLASTY Right 12/13/2016   Procedure: RIGHT TOTAL HIP ARTHROPLASTY ANTERIOR APPROACH;  Surgeon: Mcarthur Rossetti, MD;  Location: [facility];  Service: Orthopedics;  Laterality: Right;       Latest Ref Rng & Units 03/22/2022    1:23 PM 03/07/2022    9:48 AM 03/03/2022   11:29 AM  CBC  WBC 4.0 - 10.5 K/uL 8.4  6.5  8.1   Hemoglobin 13.0 - 17.0 g/dL 10.5  8.4  9.0   Hematocrit 39.0 - 52.0 % 33.4  27.8  29.0   Platelets 150 - 400 K/uL 460  446  538         Latest Ref Rng & Units 03/22/2022    1:23 PM 03/07/2022    9:48 AM 03/03/2022   11:29 AM  BMP  Glucose 70 - 99 mg/dL 111  80  86   BUN 6 - 20 mg/dL 8  12  15    Creatinine 0.61 - 1.24 mg/dL 0.99  1.02  1.21   BUN/Creat Ratio 6 - 22 (calc)  SEE NOTE:    Sodium 135 - 145 mmol/L 135  134  134   Potassium 3.5 - 5.1 mmol/L 3.9  4.2  4.4   Chloride 98 - 111 mmol/L 103  104  104   CO2 22 - 32 mmol/L 22  22  19    Calcium 8.9 - 10.3 mg/dL 9.3  9.0  9.4      Objective:   Vitals:   03/22/22 1221 03/22/22 1352  BP: (!) 149/86 (!) 145/85  Pulse: (!) 103 99  Resp: 16 15  Temp: 98 F (36.7 C) 98 F (36.7 C)  SpO2: 98% 99%    General:AA&O x 3. Normal mood and affect   Vascular: DP and PT pulses 2/4 bilateral. Brisk capillary refill to all digits. Pedal hair present   Neruological. Epicritic sensation grossly intact.   Derm: No open wounds  There is significant edema noted to the left ankle and exquisite tenderness to palpation of the ankle and with ROM. No purulence.Interspaces clears of maceration. Nails well groomed and normal in appearance  MSK: MMT 5/5 in dorsiflexion, plantar flexion, inversion and eversion. Normal joint ROM without pain or crepitus.   MRI left ankle:  IMPRESSION: IMPRESSION Compared to 01/23/2022:   1. Interval decrease in talonavicular joint fluid including resolution of the prior rim enhancing fluid that previously extended laterally from the talonavicular joint. There is still a small amount of synovium enhancing and joint fluid extending dorsally from the talonavicular joint. Again this is suspicious for septic arthritis. There is also again subchondral marrow edema suspicious for osteomyelitis. 2. New moderate tibiotalar joint effusion with new moderate thickening and enhancement of the synovium suspicious for synovitis and septic arthritis. Tibial and talar subchondral marrow edema suspicious for osteomyelitis. 3. Mild posterior tibial tenosynovitis  is mildly improved from prior. Mild tendinosis appears similar to prior.   Assessment & Plan:  Patient was evaluated and treated and all questions answered.  DX: Left ankle septic arthritis  Antibiotics: Hold until surgery as long as stable.  DME: CAM boot   Discussed with patient diagnosis and treatment options.  Imaging reviewed. Discussed with patient  WBC:8.4 ESR: 105, CRP 6.2 Discussed with patient plan for washout of ankle and biopsy of bone. Patient in agreement with plan. Still in process of being admitted. Will plan for OR tomorrow afternoon. NPO after midnight.   Patient in agreement with plan and all questions answered.   Lorenda Peck, DPM  Accessible via secure chat for questions or concerns.

## 2022-03-22 NOTE — ED Notes (Signed)
Received verbal report Beverely Pace RN at this time

## 2022-03-22 NOTE — H&P (Cosign Needed Addendum)
Hospital Admission History and Physical Service Pager: [phone]  Patient name: Dennis Harris Medical record number: [id number] Date of Birth: 1965-11-07 Age: 56 y.o. Gender: male  Primary Care Provider: Patient, No Pcp Per Consultants: Podiatry  Code Status: Full Preferred Emergency Contact:  Contact Information     Name Relation Home Work Mobile   Katt,Angeline Spouse   [phone]       Chief Complaint: Left ankle pain, infection  Assessment and Plan: Dennis Harris is a 56 y.o. male presenting with left ankle pain, found to have left ankle septic arthritis and osteomyelitis. Differential for this patient's presentation also includes rheumatoid arthritis flare, gout, or fracture, but these are unlikely given MRI findings.    * Septic arthritis of ankle (HCC) MRI outpatient showed concern for septic arthritis. History of same, s/p irrigation & debridement with podiatry on 02/02/22 and 4-week course of antibiotics (bactrim/minocycline). - Admit to FMTS, med-tele, attending Dr. Miquel Dunn - Podiatry consulted, plan for wound debridement/bone biopsy tomorrow - N.p.o. at midnight - Holding off on antibiotics per podiatry recommendations - Consider ID consult - Pain control: Tylenol 1000mg  q6h, Ibuprofen 600mg  q6h, Gabapentin 600mg  qhs, Tramadol 50mg  q6h - f/u blood cx - PT/OT eval and treat  Hypertension Home meds previously included: chlorthalidone and losartan.  He was stopped on his medications and started on 10 mg amlodipine last admission due to AKI and ongoing Bactrim use.  He is severely hypertensive in the ED. Suspect this is underlying hypertension exacerbated by pain. - Continue amlodipine 10 mg - Resume Coreg 3.125 mg twice daily, heart rate borderline tachycardic - Control pain - Monitor BP and renal function, consider restarting chlorthalidone or losartan  HFrEF (heart failure with reduced ejection fraction) (HCC) H/o nonischemic cardiomyopathy with  reduced EF 35-40% in 2021. Lost to cardiology follow up. No evidence of acute CHF currently. - Recommend outpatient cardiology f/u and repeat echo - Consider starting GDMT while admitted pending need for antibiotics and renal function  Microcytic anemia Chronic. Hgb 10.5 on admission. Suspect mixed picture of anemia of chronic disease and iron deficiency. Recent iron studies show iron 32, sat ratio 11%. - Hold off on iron supplementation during current infection - Recommend outpatient colorectal cancer screening  Tobacco abuse Patient currently smokes 3 to 4 cigarettes daily. - Nicotine patch offered, patient declined - Cessation counseling  Hyperlipidemia Was lost to cardiology f/u and is not taking statin. ASCVD risk 38% based on last lipid panel in 2021. - Resume atorvastatin 40mg  daily - Check updated lipid panel  Rheumatoid arthritis (HCC) Chronic.  Managed by rheumatology.  Methotrexate was stopped previously due to kidney function concerns during antibiotics.  He began his methotrexate yesterday.  He takes his medication weekly. - Follow-up outpatient with rheumatology for medication management - May need to discontinue prior to discharge depending on antibiotics - Continue folic acid    FEN/GI: NPO midnight  VTE Prophylaxis: SCDs, surgery tomorrow   Disposition: med-tele  History of Present Illness:  Dennis Harris is a 56 y.o. male presenting with left ankle pain ongoing several months but with severe worsening in the last few days.   Issues first started around April of this year. Was admitted in July with septic arthritis- underwent OR procedure on July 12th. Subsequently completed 4 weeks antibiotics, finished course about 2 weeks ago.  He describes sharp, shooting ankle pain that is severe. Difficulty sleeping secondary to pain. For the past 1 week has been taking Ibuprofen 800 mg  TID with mild-moderate relief. Oxycodone did not help at all. Denies fever, chills,  GI symptoms, chest pain, dyspnea or other complaints.  Previously on Losartan and chlorthalidone for blood pressure, but was told to stop it after the procedure due to the antibiotics. Was started on amlodipine instead. Just resumed his methotrexate yesterday he takes this medication once a week.  Has been following with both podiatry and ID as an outpatient. Had outpatient MRI yesterday, podiatry recommended he come to ED for OR due to persistent septic arthritis and osteomyelitis.   In the ED, his blood pressure was elevated and continue to increase with his pain. Patient was not tachycardic, was afebrile and had a normal WBC. BMP was unremarkable. Blood cultures pending.   Review Of Systems: Per HPI with the following additions: Denies bloody/black stool, nausea, vomiting, chest pain, SOB.   Pertinent Past Medical History: History septic arthritis of the ankle Hypertension Rheumatoid arthritis Tobacco abuse  Nonischemic cardiomyopathy Hx avascular necrosis of both hips  Remainder reviewed in history tab.   Pertinent Past Surgical History: Past Surgical History:  Procedure Laterality Date   ANTERIOR CERVICAL DECOMP/DISCECTOMY FUSION N/A 11/14/2012   Procedure: ANTERIOR CERVICAL DECOMPRESSION/DISCECTOMY FUSION 1 LEVEL C5-6;  Surgeon: Venita Lick, MD;  Location: [facility];  Service: Orthopedics;  Laterality: N/A;   APPENDECTOMY  1970's   BACK SURGERY     CARDIAC CATHETERIZATION  May 2010   normal   I & D EXTREMITY Left 02/02/2022   Procedure: IRRIGATION AND DEBRIDEMENT LEFT FOOT/ANKLE;  Surgeon: Candelaria Stagers, DPM;  Location: [facility];  Service: Podiatry;  Laterality: Left;   JOINT REPLACEMENT     TONSILLECTOMY  1970's?   TOTAL HIP ARTHROPLASTY Right 12/13/2016   TOTAL HIP ARTHROPLASTY Right 12/13/2016   Procedure: RIGHT TOTAL HIP ARTHROPLASTY ANTERIOR APPROACH;  Surgeon: Kathryne Hitch, MD;  Location: [facility];  Service: Orthopedics;  Laterality: Right;    Remainder reviewed in  history tab.  Pertinent Social History: Tobacco use: Yes, 3-4 cigarettes daily.  Alcohol use: yes, one beer nightly Other Substance use: denies Lives with spouse   Pertinent Family History: N/a  Remainder reviewed in history tab.   Important Outpatient Medications: Methotrexate 20mg  once weekly Amlodipine 10mg  daily Gabapentin 300mg  qhs Remainder reviewed in medication history.   Objective: BP (!) 163/85   Pulse (!) 109   Temp 98.3 F (36.8 C) (Oral)   Resp (!) 36   Ht 5\' 11"  (1.803 m)   Wt 72.6 kg   SpO2 98%   BMI 22.32 kg/m  Exam: General: Well-appearing, no acute distress Cardiovascular: Tachycardic, regular rhythm, murmur 2/6 on exam. Respiratory: Clear, no consolidations, no crackles.  No increased work of breathing Gastrointestinal: Nondistended, nontender MSK: Unilateral edema surrounding the left ankle joint.  With well-healing previous surgical scars.  No drainage or open wounds. Neuro: Alert and oriented x3, no facial droop, ocular motions intact, normal speech   Labs:  CBC BMET  Recent Labs  Lab 03/22/22 1323  WBC 8.4  HGB 10.5*  HCT 33.4*  PLT 460*   Recent Labs  Lab 03/22/22 1323  NA 135  K 3.9  CL 103  CO2 22  BUN 8  CREATININE 0.99  GLUCOSE 111*  CALCIUM 9.3    CRP 6.2, sed rate 105  EKG: Sinus tachycardia, normal axis, left ventricular hypertrophy, no ST elevations.   Imaging Studies Performed:  MRI left ankle:  -Interval disease in the talonavicular joint, small amount of synovium enhancing joint fluid extending  dorsally suspicious for septic arthritis - Subchondral marrow edema suspicious for osteomyelitis - New moderate tibiotalar joint effusion with moderate thickening and enhancement of the synovium suspicious for synovitis and septic arthritis   Glendale Chard, DO 03/22/2022, 10:02 PM PGY-1, [facility]  FPTS Intern pager: [phone], text pages welcome Secure chat group [facility] Teaching  Service    FPTS Upper-Level Resident Addendum   I have independently interviewed and examined the patient. I have discussed the above with Dr. Hyacinth Meeker and agree with the documented plan. My edits for correction/addition/clarification are included above. Please see any attending notes.   Maury Dus, MD PGY-3, [facility] 03/22/2022 10:02 PM  FPTS Service pager: [phone] (text pages welcome through [facility])

## 2022-03-22 NOTE — Assessment & Plan Note (Addendum)
Left ankle septic arthritis and osteomyelitis confirmed by MRI, s/p irrigation & debridement with podiatry on 02/02/22 and 4-week course of Bactrim/minocycline. Prior debridement isolated stenotrophomonas maltophilia and eggartella catenaformis. S/p irrigation and debridement with podiatry on 03/23/22 which yielded left ankle culture x2, left distal tibial bone biopsy and left talar bone biopsy. -ID consulted, appreciate care and recommendations --Started on minocycline and Unasyn per ID  --Awaiting bone bx, can narrow abx as able - Pain control (pain score currently 4/10): Tylenol 1000mg  q6h, Tramadol 50mg  q6h, Gabapentin 600mg  qhs, Ibuprofen 600mg  q6h, Oxycodone 10 mg  - PT/OT eval and treat

## 2022-03-22 NOTE — ED Provider Notes (Signed)
Patient is a 56 year old male presenting with recurrent left ankle swelling and pain.  He has had an MRI performed several days ago confirming that there was signs of a septic joint.  He had been on antibiotics for over a month, no longer taking them, follows with Dr. Loreta Ave with podiatry.  He was advised to come to the hospital for admission antibiotics to prep for surgery.  He is not having objective fevers he is not nauseated or vomiting his vital signs are unremarkable and he has no leukocytosis.  Pending podiatry consult and progression towards admission at this time  On my exam he does have a swollen tender left ankle with minimal if any movement available secondary to severe pain.  He is not tachycardic  Medical screening examination/treatment/procedure(s) were conducted as a shared visit with non-physician practitioner(s) and myself.  I personally evaluated the patient during the encounter.  Clinical Impression:   Final diagnoses:  None         Eber Hong, MD 03/22/22 2336

## 2022-03-22 NOTE — ED Triage Notes (Signed)
Has surgery on left ankle for septic arthritis and was told to come back bc still having swelling and will need surgery again.

## 2022-03-22 NOTE — Progress Notes (Signed)
Interim Progress Note  Received ED sign out for this new admission. Night team will follow up with full H&P.   Went down to assess patient for stability.  He reports ongoing problems April of this year.  States that he recently had an MRI this past weekend and received a call from his doctor yesterday telling him to come in to the ED for repeat washout. He currently reports 10/10 pain. Takes 800 mg Ibuprofen BID at home which helps him some. Has still been able to bear weight and walk. He states that he was previously on a 28-day course of antibiotics and completed this.  He is unable to tell me the name of the abx. Has been dealing with RA, just restarted his methotrexate yesterday.   O: Blood pressure (!) 189/108, pulse 92, temperature 98.3 F (36.8 C), temperature source Oral, resp. rate 16, height 5\' 11"  (1.803 m), weight 72.6 kg, SpO2 95 %. Gen: NAD, pleasant, non-toxic appearing Ext: left foot ankle is edematous and warm to touch. Tender with minimal palpation. Still with active ROM   A/P Left ankle septic arthritis Overall stable at this time. BP is quite elevated. Inflammatory markers elevated.  -Podiatry is following, plan for OR tomorrow afternoon -Order in for NPO at midnight -Per Podiatry, hold off on abx. Ideally would like to get culture in OR prior to initiation  -Temp admission orders placed -Full H&P and orders to come

## 2022-03-22 NOTE — Hospital Course (Addendum)
Dennis Harris is a 56 y.o. male with a PMH of RA, HTN, nonischemic cardiomyopathy, AVN of both hips, tobacco abuse who was admitted for left ankle septic arthritis and osteomyelitis confirmed by MRI. Hospital course as listed below:   Septic arthritis of ankle (HCC) On this admission, he was s/p irrigation & debridement with podiatry on 02/02/22 which isolated stenotrophomonas maltophilia and eggartella catenaformis treated with subsequent 4-week course of Bactrim/minocycline. On 03/23/22, he had another irrigation and debridgement with podiatry which yielded left ankle culture x2 and bone biopsy x2. He was then started on Unasyn and minocycline, and switched to oral minocycline and Augmentin.  He was discharged with a CAM boot and walker/cane.    HFrEF (heart failure with reduced ejection fraction) and HTN  H/o nonischemic cardiomyopathy with reduced EF 35-40% in 2021. Echo on 03/25/22 showed normal EF of 60-65%, moderate concentric LVH, and mild dilation of the left atrium. Given improved EF, did not start Jardiance as felt GDMT not necessary at this time, but can consider in the future.   Rheumatoid arthritis (HCC) - Methotrexate dose taken 8/29, taken weekly. On prednisone and folic acid.   Hyperlipidemia - ASCVD risk currently 17.4-24%. Continue atorvastatin 40mg  daily Microcytic anemia - Suspect mixed picture of anemia of chronic disease and iron deficiency. Recent iron studies show iron 32, sat ratio 11%.   Follow up: Follow up with podiatry post-op  Recommend outpatient HTN follow-up. He is on losartan, amlodipine, and carvedilol for HTN. He has been on Entresto and chlorthalidone in the past. Recommend starting (his insurance will cover it) for GDMT.  Encourage smoking cessation Recommend outpatient rheumatology f/u for medication management Recommend outpatient colorectal cancer screening due to chronic microcytic anemia Recommend PT/OT follow up

## 2022-03-22 NOTE — Assessment & Plan Note (Addendum)
H/o nonischemic cardiomyopathy with reduced EF 35-40% in 2021. Lost to cardiology follow up, hx of Entresto rx. No evidence of acute CHF currently. - Recommend outpatient cardiology f/u and repeat Echo - Started losartan 25 mg -- Continue carvedilol 3.125 mg   -- Continue atorvastatin 40 mg

## 2022-03-22 NOTE — H&P (View-Only) (Signed)
Subjective:  Patient ID: Dennis Harris, male    DOB: 04/29/1966,  MRN: [id number]  Patient with past medical history of RA and previous septic arthritis of the TN joint on the left foot seen at beside today for concern of septic arthritis of the ankle. Dr. Jacqualyn Posey has been following patient and recently underwent MRI for continued pain in the ankle which showed concern for new infection and septic arthritis. Sent to the ED for further work-up and I&D of the left ankle.  Patient relates continued pain in the left ankle. Denies n/v/f/c.   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[place])    right   Exertional shortness of breath    "at times" (11/14/2012)   Hypercholesteremia    Hypertension    Pericarditis ~ 2009   Pneumonia    Rheumatoid arthritis(714.0)      Past Surgical History:  Procedure Laterality Date   ANTERIOR CERVICAL DECOMP/DISCECTOMY FUSION N/A 11/14/2012   Procedure: ANTERIOR CERVICAL DECOMPRESSION/DISCECTOMY FUSION 1 LEVEL C5-6;  Surgeon: Melina Schools, MD;  Location: [facility];  Service: Orthopedics;  Laterality: N/A;   APPENDECTOMY  1970's   BACK SURGERY     CARDIAC CATHETERIZATION  May 2010   normal   I & D EXTREMITY Left 02/02/2022   Procedure: IRRIGATION AND DEBRIDEMENT LEFT FOOT/ANKLE;  Surgeon: Felipa Furnace, DPM;  Location: [facility];  Service: Podiatry;  Laterality: Left;   JOINT REPLACEMENT     TONSILLECTOMY  1970's?   TOTAL HIP ARTHROPLASTY Right 12/13/2016   TOTAL HIP ARTHROPLASTY Right 12/13/2016   Procedure: RIGHT TOTAL HIP ARTHROPLASTY ANTERIOR APPROACH;  Surgeon: Mcarthur Rossetti, MD;  Location: [facility];  Service: Orthopedics;  Laterality: Right;       Latest Ref Rng & Units 03/22/2022    1:23 PM 03/07/2022    9:48 AM 03/03/2022   11:29 AM  CBC  WBC 4.0 - 10.5 K/uL 8.4  6.5  8.1   Hemoglobin 13.0 - 17.0 g/dL 10.5  8.4  9.0   Hematocrit 39.0 - 52.0 % 33.4  27.8  29.0   Platelets 150 - 400 K/uL 460  446  538         Latest Ref Rng & Units 03/22/2022    1:23 PM 03/07/2022    9:48 AM 03/03/2022   11:29 AM  BMP  Glucose 70 - 99 mg/dL 111  80  86   BUN 6 - 20 mg/dL 8  12  15    Creatinine 0.61 - 1.24 mg/dL 0.99  1.02  1.21   BUN/Creat Ratio 6 - 22 (calc)  SEE NOTE:    Sodium 135 - 145 mmol/L 135  134  134   Potassium 3.5 - 5.1 mmol/L 3.9  4.2  4.4   Chloride 98 - 111 mmol/L 103  104  104   CO2 22 - 32 mmol/L 22  22  19    Calcium 8.9 - 10.3 mg/dL 9.3  9.0  9.4      Objective:   Vitals:   03/22/22 1221 03/22/22 1352  BP: (!) 149/86 (!) 145/85  Pulse: (!) 103 99  Resp: 16 15  Temp: 98 F (36.7 C) 98 F (36.7 C)  SpO2: 98% 99%    General:AA&O x 3. Normal mood and affect   Vascular: DP and PT pulses 2/4 bilateral. Brisk capillary refill to all digits. Pedal hair present   Neruological. Epicritic sensation grossly intact.   Derm: No open wounds  There is significant edema noted to the left ankle and exquisite tenderness to palpation of the ankle and with ROM. No purulence.Interspaces clears of maceration. Nails well groomed and normal in appearance  MSK: MMT 5/5 in dorsiflexion, plantar flexion, inversion and eversion. Normal joint ROM without pain or crepitus.   MRI left ankle:  IMPRESSION: IMPRESSION Compared to 01/23/2022:   1. Interval decrease in talonavicular joint fluid including resolution of the prior rim enhancing fluid that previously extended laterally from the talonavicular joint. There is still a small amount of synovium enhancing and joint fluid extending dorsally from the talonavicular joint. Again this is suspicious for septic arthritis. There is also again subchondral marrow edema suspicious for osteomyelitis. 2. New moderate tibiotalar joint effusion with new moderate thickening and enhancement of the synovium suspicious for synovitis and septic arthritis. Tibial and talar subchondral marrow edema suspicious for osteomyelitis. 3. Mild posterior tibial tenosynovitis  is mildly improved from prior. Mild tendinosis appears similar to prior.   Assessment & Plan:  Patient was evaluated and treated and all questions answered.  DX: Left ankle septic arthritis  Antibiotics: Hold until surgery as long as stable.  DME: CAM boot   Discussed with patient diagnosis and treatment options.  Imaging reviewed. Discussed with patient  WBC:8.4 ESR: 105, CRP 6.2 Discussed with patient plan for washout of ankle and biopsy of bone. Patient in agreement with plan. Still in process of being admitted. Will plan for OR tomorrow afternoon. NPO after midnight.   Patient in agreement with plan and all questions answered.   Lorenda Peck, DPM  Accessible via secure chat for questions or concerns.

## 2022-03-22 NOTE — Assessment & Plan Note (Addendum)
Was lost to cardiology f/u and was not taking statin. ASCVD risk 17.4-24% based on most recent lipid panel - Continue atorvastatin 40mg  daily

## 2022-03-22 NOTE — Assessment & Plan Note (Addendum)
Patient currently smokes 3 to 4 cigarettes daily. - Nicotine patch offered, patient declined - Cessation counseling

## 2022-03-23 ENCOUNTER — Encounter (HOSPITAL_COMMUNITY): Payer: Self-pay | Admitting: Student

## 2022-03-23 ENCOUNTER — Observation Stay (HOSPITAL_COMMUNITY): Payer: Managed Care, Other (non HMO) | Admitting: Certified Registered Nurse Anesthetist

## 2022-03-23 ENCOUNTER — Other Ambulatory Visit (HOSPITAL_COMMUNITY): Payer: Self-pay

## 2022-03-23 ENCOUNTER — Other Ambulatory Visit: Payer: Self-pay

## 2022-03-23 ENCOUNTER — Encounter (HOSPITAL_COMMUNITY)
Admission: EM | Disposition: A | Discharge status: Home / Self Care | Payer: Self-pay | Source: Home / Self Care | Attending: Family Medicine

## 2022-03-23 ENCOUNTER — Telehealth (HOSPITAL_COMMUNITY): Payer: Self-pay | Admitting: Pharmacy Technician

## 2022-03-23 DIAGNOSIS — F17210 Nicotine dependence, cigarettes, uncomplicated: Secondary | ICD-10-CM

## 2022-03-23 DIAGNOSIS — M00872 Arthritis due to other bacteria, left ankle and foot: Secondary | ICD-10-CM

## 2022-03-23 DIAGNOSIS — I509 Heart failure, unspecified: Secondary | ICD-10-CM | POA: Diagnosis not present

## 2022-03-23 DIAGNOSIS — Z91013 Allergy to seafood: Secondary | ICD-10-CM | POA: Diagnosis not present

## 2022-03-23 DIAGNOSIS — Z716 Tobacco abuse counseling: Secondary | ICD-10-CM | POA: Diagnosis not present

## 2022-03-23 DIAGNOSIS — E785 Hyperlipidemia, unspecified: Secondary | ICD-10-CM | POA: Diagnosis not present

## 2022-03-23 DIAGNOSIS — D638 Anemia in other chronic diseases classified elsewhere: Secondary | ICD-10-CM | POA: Diagnosis present

## 2022-03-23 DIAGNOSIS — M25472 Effusion, left ankle: Secondary | ICD-10-CM | POA: Diagnosis present

## 2022-03-23 DIAGNOSIS — M86072 Acute hematogenous osteomyelitis, left ankle and foot: Secondary | ICD-10-CM | POA: Diagnosis not present

## 2022-03-23 DIAGNOSIS — I5022 Chronic systolic (congestive) heart failure: Secondary | ICD-10-CM | POA: Diagnosis present

## 2022-03-23 DIAGNOSIS — E7800 Pure hypercholesterolemia, unspecified: Secondary | ICD-10-CM | POA: Diagnosis present

## 2022-03-23 DIAGNOSIS — I428 Other cardiomyopathies: Secondary | ICD-10-CM | POA: Diagnosis present

## 2022-03-23 DIAGNOSIS — M869 Osteomyelitis, unspecified: Secondary | ICD-10-CM | POA: Diagnosis present

## 2022-03-23 DIAGNOSIS — Z981 Arthrodesis status: Secondary | ICD-10-CM | POA: Diagnosis not present

## 2022-03-23 DIAGNOSIS — Z8249 Family history of ischemic heart disease and other diseases of the circulatory system: Secondary | ICD-10-CM | POA: Diagnosis not present

## 2022-03-23 DIAGNOSIS — M069 Rheumatoid arthritis, unspecified: Secondary | ICD-10-CM | POA: Diagnosis present

## 2022-03-23 DIAGNOSIS — M86079 Acute hematogenous osteomyelitis, unspecified ankle and foot: Secondary | ICD-10-CM | POA: Diagnosis not present

## 2022-03-23 DIAGNOSIS — M00079 Staphylococcal arthritis, unspecified ankle and foot: Secondary | ICD-10-CM | POA: Diagnosis not present

## 2022-03-23 DIAGNOSIS — Z91030 Bee allergy status: Secondary | ICD-10-CM | POA: Diagnosis not present

## 2022-03-23 DIAGNOSIS — M00879 Arthritis due to other bacteria, unspecified ankle and foot: Secondary | ICD-10-CM | POA: Diagnosis not present

## 2022-03-23 DIAGNOSIS — M009 Pyogenic arthritis, unspecified: Secondary | ICD-10-CM | POA: Diagnosis present

## 2022-03-23 DIAGNOSIS — Z79631 Long term (current) use of antimetabolite agent: Secondary | ICD-10-CM | POA: Diagnosis not present

## 2022-03-23 DIAGNOSIS — I10 Essential (primary) hypertension: Secondary | ICD-10-CM | POA: Diagnosis not present

## 2022-03-23 DIAGNOSIS — D509 Iron deficiency anemia, unspecified: Secondary | ICD-10-CM | POA: Diagnosis present

## 2022-03-23 DIAGNOSIS — I110 Hypertensive heart disease with heart failure: Secondary | ICD-10-CM

## 2022-03-23 DIAGNOSIS — M05772 Rheumatoid arthritis with rheumatoid factor of left ankle and foot without organ or systems involvement: Secondary | ICD-10-CM | POA: Diagnosis not present

## 2022-03-23 DIAGNOSIS — I5020 Unspecified systolic (congestive) heart failure: Secondary | ICD-10-CM | POA: Diagnosis not present

## 2022-03-23 DIAGNOSIS — Z96641 Presence of right artificial hip joint: Secondary | ICD-10-CM | POA: Diagnosis present

## 2022-03-23 DIAGNOSIS — M19072 Primary osteoarthritis, left ankle and foot: Secondary | ICD-10-CM | POA: Diagnosis present

## 2022-03-23 DIAGNOSIS — Z79899 Other long term (current) drug therapy: Secondary | ICD-10-CM | POA: Diagnosis not present

## 2022-03-23 HISTORY — PX: I & D EXTREMITY: SHX5045

## 2022-03-23 LAB — BASIC METABOLIC PANEL
Anion gap: 11 (ref 5–15)
BUN: 10 mg/dL (ref 6–20)
CO2: 21 mmol/L — ABNORMAL LOW (ref 22–32)
Calcium: 8.9 mg/dL (ref 8.9–10.3)
Chloride: 101 mmol/L (ref 98–111)
Creatinine, Ser: 1.04 mg/dL (ref 0.61–1.24)
GFR, Estimated: >60 mL/min (ref >60)
Glucose, Bld: 98 mg/dL (ref 70–99)
Potassium: 4 mmol/L (ref 3.5–5.1)
Sodium: 133 mmol/L — ABNORMAL LOW (ref 135–145)

## 2022-03-23 LAB — CBC
HCT: 30.3 % — ABNORMAL LOW (ref 39.0–52.0)
Hemoglobin: 9.7 g/dL — ABNORMAL LOW (ref 13.0–17.0)
MCH: 25 pg — ABNORMAL LOW (ref 26.0–34.0)
MCHC: 32 g/dL (ref 30.0–36.0)
MCV: 78.1 fL — ABNORMAL LOW (ref 80.0–100.0)
Platelets: 412 10*3/uL — ABNORMAL HIGH (ref 150–400)
RBC: 3.88 MIL/uL — ABNORMAL LOW (ref 4.22–5.81)
RDW: 15.8 % — ABNORMAL HIGH (ref 11.5–15.5)
WBC: 6.9 10*3/uL (ref 4.0–10.5)
nRBC: 0 % (ref 0.0–0.2)

## 2022-03-23 LAB — LIPID PANEL
Cholesterol: 180 mg/dL (ref 0–200)
HDL: 38 mg/dL — ABNORMAL LOW (ref >40)
LDL Cholesterol: 124 mg/dL — ABNORMAL HIGH (ref 0–99)
Total CHOL/HDL Ratio: 4.7 RATIO
Triglycerides: 91 mg/dL (ref <150)
VLDL: 18 mg/dL (ref 0–40)

## 2022-03-23 LAB — SURGICAL PCR SCREEN
MRSA, PCR: NEGATIVE
Staphylococcus aureus: NEGATIVE

## 2022-03-23 SURGERY — IRRIGATION AND DEBRIDEMENT EXTREMITY
Anesthesia: Monitor Anesthesia Care | Laterality: Left

## 2022-03-23 MED ORDER — ACETAMINOPHEN 500 MG PO TABS: 1000.0000 mg | ORAL_TABLET | Freq: Once | ORAL | Status: DC | PRN

## 2022-03-23 MED ORDER — LIDOCAINE 2% (20 MG/ML) 5 ML SYRINGE
INTRAMUSCULAR | Status: AC
  Filled 2022-03-23: qty 5

## 2022-03-23 MED ORDER — BUPIVACAINE HCL 0.5 % IJ SOLN
INTRAMUSCULAR | Status: AC
  Filled 2022-03-23: qty 1

## 2022-03-23 MED ORDER — PROPOFOL 500 MG/50ML IV EMUL
INTRAVENOUS | Status: DC | PRN
  Administered 2022-03-23: 150 ug/kg/min via INTRAVENOUS

## 2022-03-23 MED ORDER — FENTANYL CITRATE (PF) 250 MCG/5ML IJ SOLN
INTRAMUSCULAR | Status: AC
  Filled 2022-03-23: qty 5

## 2022-03-23 MED ORDER — OXYCODONE HCL 5 MG/5ML PO SOLN: 5.0000 mg | Freq: Once | ORAL | Status: DC | PRN

## 2022-03-23 MED ORDER — CHLORHEXIDINE GLUCONATE CLOTH 2 % EX PADS
6.0000 | MEDICATED_PAD | Freq: Once | CUTANEOUS | Status: AC
  Administered 2022-03-23: 6 via TOPICAL

## 2022-03-23 MED ORDER — CEFAZOLIN SODIUM 1 G IJ SOLR
INTRAMUSCULAR | Status: AC
  Filled 2022-03-23: qty 20

## 2022-03-23 MED ORDER — OXYCODONE HCL 5 MG PO TABS: 5.0000 mg | ORAL_TABLET | Freq: Once | ORAL | Status: DC | PRN

## 2022-03-23 MED ORDER — TOBRAMYCIN SULFATE 1.2 G IJ SOLR
INTRAMUSCULAR | Status: AC
Start: 2022-03-23 — End: ?
  Filled 2022-03-23: qty 1.2

## 2022-03-23 MED ORDER — MIDAZOLAM HCL 2 MG/2ML IJ SOLN
INTRAMUSCULAR | Status: DC | PRN
  Administered 2022-03-23: 2 mg via INTRAVENOUS

## 2022-03-23 MED ORDER — PHENYLEPHRINE 80 MCG/ML (10ML) SYRINGE FOR IV PUSH (FOR BLOOD PRESSURE SUPPORT)
PREFILLED_SYRINGE | INTRAVENOUS | Status: DC | PRN
  Administered 2022-03-23: 240 ug via INTRAVENOUS
  Administered 2022-03-23: 120 ug via INTRAVENOUS

## 2022-03-23 MED ORDER — BUPIVACAINE HCL (PF) 0.5 % IJ SOLN
INTRAMUSCULAR | Status: DC | PRN
  Administered 2022-03-23: 10 mL

## 2022-03-23 MED ORDER — CHLORHEXIDINE GLUCONATE 0.12 % MT SOLN
OROMUCOSAL | Status: AC
  Filled 2022-03-23: qty 15

## 2022-03-23 MED ORDER — ACETAMINOPHEN 10 MG/ML IV SOLN: 1000.0000 mg | Freq: Once | INTRAVENOUS | Status: DC | PRN

## 2022-03-23 MED ORDER — LACTATED RINGERS IV SOLN: INTRAVENOUS | Status: DC

## 2022-03-23 MED ORDER — ORAL CARE MOUTH RINSE: 15.0000 mL | Freq: Once | OROMUCOSAL | Status: AC

## 2022-03-23 MED ORDER — LIDOCAINE HCL 2 % IJ SOLN
INTRAMUSCULAR | Status: DC | PRN
  Administered 2022-03-23: 10 mL

## 2022-03-23 MED ORDER — ACETAMINOPHEN 160 MG/5ML PO SOLN: 1000.0000 mg | Freq: Once | ORAL | Status: DC | PRN

## 2022-03-23 MED ORDER — PROPOFOL 10 MG/ML IV BOLUS
INTRAVENOUS | Status: AC
  Filled 2022-03-23: qty 20

## 2022-03-23 MED ORDER — LIDOCAINE HCL 2 % IJ SOLN
INTRAMUSCULAR | Status: AC
  Filled 2022-03-23: qty 20

## 2022-03-23 MED ORDER — CHLORHEXIDINE GLUCONATE 0.12 % MT SOLN
15.0000 mL | Freq: Once | OROMUCOSAL | Status: AC
  Administered 2022-03-23: 15 mL via OROMUCOSAL

## 2022-03-23 MED ORDER — SODIUM CHLORIDE 0.9 % IR SOLN
Status: DC | PRN
  Administered 2022-03-23: 3000 mL

## 2022-03-23 MED ORDER — CEFAZOLIN SODIUM-DEXTROSE 2-3 GM-%(50ML) IV SOLR
INTRAVENOUS | Status: DC | PRN
  Administered 2022-03-23: 2 g via INTRAVENOUS

## 2022-03-23 MED ORDER — LIDOCAINE 2% (20 MG/ML) 5 ML SYRINGE
INTRAMUSCULAR | Status: DC | PRN
  Administered 2022-03-23: 60 mg via INTRAVENOUS

## 2022-03-23 MED ORDER — FENTANYL CITRATE (PF) 100 MCG/2ML IJ SOLN: 25.0000 ug | INTRAMUSCULAR | Status: DC | PRN

## 2022-03-23 MED ORDER — MIDAZOLAM HCL 2 MG/2ML IJ SOLN
INTRAMUSCULAR | Status: AC
  Filled 2022-03-23: qty 2

## 2022-03-23 MED ORDER — FENTANYL CITRATE (PF) 250 MCG/5ML IJ SOLN
INTRAMUSCULAR | Status: DC | PRN
Start: 2022-03-23 — End: 2022-03-23
  Administered 2022-03-23: 50 ug via INTRAVENOUS

## 2022-03-23 SURGICAL SUPPLY — 64 items
ABDOMINAL PAD ABD IMPLANT
APL PRP STRL LF DISP 70% ISPRP (MISCELLANEOUS) ×1
BAG COUNTER SPONGE SURGICOUNT (BAG) ×1 IMPLANT
BAG SPNG CNTER NS LX DISP (BAG) ×1
BANDAGE ACE 4 STERILE (GAUZE/BANDAGES/DRESSINGS) IMPLANT
BLADE SAW SGTL 83.5X18.5 (BLADE) ×1 IMPLANT
BLADE SURG 15 STRL LF DISP TIS (BLADE) ×3 IMPLANT
BLADE SURG 15 STRL SS (BLADE) ×3
BNDG CMPR 9X4 STRL LF SNTH (GAUZE/BANDAGES/DRESSINGS)
BNDG COHESIVE 6X5 TAN STRL LF (GAUZE/BANDAGES/DRESSINGS) IMPLANT
BNDG ELASTIC 4X5.8 VLCR STR LF (GAUZE/BANDAGES/DRESSINGS) IMPLANT
BNDG ESMARK 4X9 LF (GAUZE/BANDAGES/DRESSINGS) ×1 IMPLANT
BNDG GAUZE DERMACEA FLUFF 4 (GAUZE/BANDAGES/DRESSINGS) ×2 IMPLANT
BNDG GZE DERMACEA 4 6PLY (GAUZE/BANDAGES/DRESSINGS) ×2
BOWL SMART MIX CTS (DISPOSABLE) IMPLANT
CHLORAPREP W/TINT 26 (MISCELLANEOUS) ×1 IMPLANT
CNTNR URN SCR LID CUP LEK RST (MISCELLANEOUS) IMPLANT
CONT SPEC 4OZ STRL OR WHT (MISCELLANEOUS) ×2
COVER SURGICAL LIGHT HANDLE (MISCELLANEOUS) ×1 IMPLANT
CUFF TOURN SGL QUICK 18X4 (TOURNIQUET CUFF) ×1 IMPLANT
CUFF TOURN SGL QUICK 34 (TOURNIQUET CUFF)
CUFF TRNQT CYL 34X4.125X (TOURNIQUET CUFF) ×1 IMPLANT
DRAPE OEC MINIVIEW 54X84 (DRAPES) ×1 IMPLANT
DRAPE U-SHAPE 47X51 STRL (DRAPES) ×1 IMPLANT
DRSG PAD ABDOMINAL 8X10 ST (GAUZE/BANDAGES/DRESSINGS) ×1 IMPLANT
DRSG XEROFORM 1X8 (GAUZE/BANDAGES/DRESSINGS) IMPLANT
ELECT CAUTERY BLADE 6.4 (BLADE) ×1 IMPLANT
ELECT REM PT RETURN 9FT ADLT (ELECTROSURGICAL) ×1
ELECTRODE REM PT RTRN 9FT ADLT (ELECTROSURGICAL) IMPLANT
GAUZE SPONGE 4X4 12PLY STRL (GAUZE/BANDAGES/DRESSINGS) ×1 IMPLANT
GAUZE XEROFORM 1X8 LF (GAUZE/BANDAGES/DRESSINGS) ×1 IMPLANT
GLOVE BIO SURGEON STRL SZ8 (GLOVE) ×1 IMPLANT
GLOVE BIOGEL PI IND STRL 8 (GLOVE) ×1 IMPLANT
GLOVE BIOGEL PI INDICATOR 8 (GLOVE) ×1
GOWN STRL REUS W/ TWL LRG LVL3 (GOWN DISPOSABLE) ×2 IMPLANT
GOWN STRL REUS W/TWL LRG LVL3 (GOWN DISPOSABLE) ×2
HANDPIECE INTERPULSE COAX TIP (DISPOSABLE) ×1
KIT BASIN OR (CUSTOM PROCEDURE TRAY) ×1 IMPLANT
KIT TURNOVER KIT B (KITS) ×1 IMPLANT
MANIFOLD NEPTUNE II (INSTRUMENTS) ×1 IMPLANT
NDL BIOPSY JAMSHIDI 8X6 (NEEDLE) IMPLANT
NDL HYPO 25GX1X1/2 BEV (NEEDLE) ×1 IMPLANT
NEEDLE BIOPSY JAMSHIDI 8X6 (NEEDLE) ×1 IMPLANT
NEEDLE HYPO 25GX1X1/2 BEV (NEEDLE) ×1 IMPLANT
NS IRRIG 1000ML POUR BTL (IV SOLUTION) ×1 IMPLANT
PACK ORTHO EXTREMITY (CUSTOM PROCEDURE TRAY) ×1 IMPLANT
PAD ARMBOARD 7.5X6 YLW CONV (MISCELLANEOUS) ×2 IMPLANT
PAD CAST 4YDX4 CTTN HI CHSV (CAST SUPPLIES) ×1 IMPLANT
PADDING CAST COTTON 4X4 STRL (CAST SUPPLIES) ×1
PADDING CAST COTTON 6X4 STRL (CAST SUPPLIES) ×1 IMPLANT
SET HNDPC FAN SPRY TIP SCT (DISPOSABLE) IMPLANT
SOL PREP POV-IOD 4OZ 10% (MISCELLANEOUS) ×2 IMPLANT
STAPLER VISISTAT (STAPLE) ×1 IMPLANT
STAPLER VISISTAT 35W (STAPLE) ×1 IMPLANT
STOCKINETTE IMPERVIOUS 9X36 MD (GAUZE/BANDAGES/DRESSINGS) ×1 IMPLANT
SUT ETHILON 3 0 FSL (SUTURE) IMPLANT
SUT PROLENE 3 0 PS 2 (SUTURE) ×1 IMPLANT
SWAB CULTURE LIQ STUART DBL (MISCELLANEOUS) ×1 IMPLANT
SWAB CULTURE LIQUID MINI MALE (MISCELLANEOUS) ×1 IMPLANT
SYR CONTROL 10ML LL (SYRINGE) ×1 IMPLANT
TOWEL GREEN STERILE (TOWEL DISPOSABLE) ×1 IMPLANT
TOWEL GREEN STERILE FF (TOWEL DISPOSABLE) ×1 IMPLANT
TUBE CONNECTING 12X1/4 (SUCTIONS) ×1 IMPLANT
YANKAUER SUCT BULB TIP NO VENT (SUCTIONS) ×1 IMPLANT

## 2022-03-23 NOTE — Progress Notes (Addendum)
Daily Progress Note Intern Pager: [phone]  Patient name: Dennis Harris Medical record number: [id number] Date of birth: 10-15-1965 Age: 56 y.o. Gender: male  Primary Care Provider: Patient, No Pcp Per Consultants: Podiatry  Code Status: FULL   Pt Overview and Major Events to Date:  8/29 - admitted  8/30 - surgical debridement   Assessment and Plan: Mr. Engram is a 56 y.o. male with a PMH of RA, HTN, nonischemic cardiomyopathy, AVN of both hips, tobacco abuse who was admitted for septic arthritis and osteomyelitis of the left ankle.  * Septic arthritis of ankle (HCC) MRI with concern for septic arthritis. History of same, s/p irrigation & debridement with podiatry on 02/02/22 and 4-week course of antibiotics (bactrim/minocycline). History of stenotrophomonas isolated from an aspirate in June (per podiatry note) and surgical culture growing Eggartella catenaformis--Will need ID's assistance with this unusual organism.  - Podiatry consulted, wound debridement/bone biopsy today - ID Consulted. Holding abx prior to surgery. Will defer to ID on postoperative abx - Pain control (pain score currently 4/10): Tylenol 1000mg  q6h, Ibuprofen 600mg  q6h, Gabapentin 600mg  qhs, Tramadol 50mg  q6h - PT/OT eval and treat after surgery  HFrEF (heart failure with reduced ejection fraction) (HCC) H/o nonischemic cardiomyopathy with reduced EF 35-40% in 2021. Lost to cardiology follow up. No evidence of acute CHF currently. - Recommend outpatient cardiology f/u and repeat Echo - Consider starting GDMT (adding ARB to BB and statin) while admitted pending need for antibiotics and renal function   Rheumatoid arthritis (HCC) Chronic.  Managed by rheumatology.  Methotrexate was stopped previously due to kidney function concerns during antibiotics.  He began his methotrexate two days ago.  He takes his medication weekly. - Follow-up outpatient with rheumatology for medication management - Continue  folic acid -- Holding MTX for now  Hypertension Home meds previously included: chlorthalidone and losartan.  He was stopped on his medications and started on 10 mg amlodipine last admission due to AKI and ongoing Bactrim use.  He is severely hypertensive in the ED. Suspect this is underlying hypertension exacerbated by pain. - Continue amlodipine 10 mg - Continue Coreg 3.125 mg twice daily, heart rate borderline tachycardic - Control pain - Monitor BP and renal function, consider restarting chlorthalidone or losartan  Microcytic anemia Chronic. Most recent Hb 9.7. Suspect mixed picture of anemia of chronic disease and iron deficiency. Recent iron studies show iron 32, sat ratio 11%. - Hold off on iron supplementation during current infection - Recommend outpatient colorectal cancer screening  Tobacco abuse Patient currently smokes 3 to 4 cigarettes daily. - Nicotine patch offered, patient declined - Cessation counseling  Hyperlipidemia Was lost to cardiology f/u and is not taking statin. ASCVD risk 38% based on lipid panel in 2021, currently 17.4-24% based on lipid panel today  - Continue atorvastatin 40mg  daily    FEN/GI: NPO since midnight  PPx: SCDs, surgery today  Dispo:Home in 2-3 days. Barriers include med-tele, pain management post-op, post-op biopsy and blood cultures informing antibiotic regimen, podiatry/PT recs. Subjective:  He was seen at bedside resting comfortably. Pleasant and conversational. He denies chest pain, SOB, headache/lightheadedness, abdominal pain, urinary difficulties. Last BM was yesterday. Currently rates pain 4-5/10, which is improved from last pain score of 7.   Objective: Temp:  [97.5 F (36.4 C)-99.3 F (37.4 C)] 97.5 F (36.4 C) (08/30 0808) Pulse Rate:  [66-109] 66 (08/30 0808) Resp:  [15-36] 16 (08/30 0808) BP: (140-189)/(77-108) 140/77 (08/30 0808) SpO2:  [95 %-100 %] 100 % (  08/30 6295) Weight:  [72.6 kg] 72.6 kg (08/29 1230) Physical  Exam: General: Alert and oriented, in no apparent distress, pleasant and conversational Cardiovascular: RRR S1 S2 present, no rubs, murmurs, gallops.  Respiratory: CTA bilaterally, no increased respiratory effort  Extremities: L ankle has swelling with two scars from prior debridement that are well-healed. No erythema, rash, deformity noted. Sensation intact on L plantar foot and can active range of motion of toes intact, with minimal ability to move ankle. Radialis pulses 2+ bilaterally.   Laboratory: Most recent CBC Lab Results  Component Value Date   WBC 6.9 03/23/2022   HGB 9.7 (L) 03/23/2022   HCT 30.3 (L) 03/23/2022   MCV 78.1 (L) 03/23/2022   PLT 412 (H) 03/23/2022   Most recent BMP    Latest Ref Rng & Units 03/23/2022    2:35 AM  BMP  Glucose 70 - 99 mg/dL 98   BUN 6 - 20 mg/dL 10   Creatinine 2.84 - 1.24 mg/dL 1.32   Sodium 440 - 102 mmol/L 133   Potassium 3.5 - 5.1 mmol/L 4.0   Chloride 98 - 111 mmol/L 101   CO2 22 - 32 mmol/L 21   Calcium 8.9 - 10.3 mg/dL 8.9     HDL 38 and LDL 124  Hb 9.7 Hct 30.3   Imaging/Diagnostic Tests: X-RAY Left Ankle  My interpretation: No evidence of fracture or deformity. Soft tissue swelling throughout left ankle.   MR ANKLE LEFT W WO CONTRAST  IMPRESSION Compared to 01/23/2022: 1. Interval decrease in talonavicular joint fluid including resolution of the prior rim enhancing fluid that previously extended laterally from the talonavicular joint. There is still a small amount of synovium enhancing and joint fluid extending dorsally from the talonavicular joint. Again this is suspicious for septic arthritis. There is also again subchondral marrow edema suspicious for osteomyelitis. 2. New moderate tibiotalar joint effusion with new moderate thickening and enhancement of the synovium suspicious for synovitis and septic arthritis. Tibial and talar subchondral marrow edema suspicious for osteomyelitis. 3. Mild posterior tibial  tenosynovitis is mildly improved from prior. Mild tendinosis appears similar to prior.   Ellison Hughs, Medical Student 03/23/2022, 10:59 AM [facility] FPTS Intern pager: [phone], text pages welcome Secure chat group [facility] Teaching Service      I have evaluated this patient along with Student Dr. Nicholas Lose and reviewed the above note, making necessary revisions.  Dorothyann Gibbs, MD 03/23/2022, 11:53 AM PGY-2, [facility]

## 2022-03-23 NOTE — Anesthesia Preprocedure Evaluation (Signed)
Anesthesia Evaluation  Patient identified by MRN, date of birth, ID band Patient awake    Reviewed: Allergy & Precautions, H&P , NPO status , Patient's Chart, lab work & pertinent test results  History of Anesthesia Complications Negative for: history of anesthetic complications  Airway Mallampati: III  TM Distance: >3 FB Neck ROM: Full    Dental  (+) Dental Advisory Given, Poor Dentition,    Pulmonary neg shortness of breath, neg COPD, neg recent URI, Current Smoker and Patient abstained from smoking.,    breath sounds clear to auscultation       Cardiovascular hypertension, Pt. on medications (-) angina+CHF   Rhythm:Regular Rate:Normal  1. Moderate global reduction in LV systolic function; mild LVH; grade 1  diastolic dysfunction.  2. Left ventricular ejection fraction, by estimation, is 35 to 40%. The  left ventricle has moderately decreased function. The left ventricle  demonstrates global hypokinesis. There is mild left ventricular  hypertrophy. Left ventricular diastolic  parameters are consistent with Grade I diastolic dysfunction (impaired  relaxation).  3. Right ventricular systolic function is normal. The right ventricular  size is normal.  4. The mitral valve is normal in structure. Trivial mitral valve  regurgitation. No evidence of mitral stenosis.  5. The aortic valve is tricuspid. Aortic valve regurgitation is not  visualized. No aortic stenosis is present.  6. The inferior vena cava is normal in size with greater than 50%  respiratory variability, suggesting right atrial pressure of 3 mmHg.    Neuro/Psych negative neurological ROS  negative psych ROS   GI/Hepatic negative GI ROS, Neg liver ROS,   Endo/Other  negative endocrine ROS  Renal/GU negative Renal ROSLab Results      Component                Value               Date                      CREATININE               1.04                 03/23/2022                Musculoskeletal  (+) Arthritis , Septic left ankle   Abdominal   Peds  Hematology  (+) Blood dyscrasia, anemia ,   Anesthesia Other Findings   Reproductive/Obstetrics                             Anesthesia Physical Anesthesia Plan  ASA: 3  Anesthesia Plan: MAC   Post-op Pain Management:    Induction: Intravenous  PONV Risk Score and Plan: 0 and Propofol infusion, Treatment may vary due to age or medical condition and Midazolam  Airway Management Planned: Nasal Cannula and Natural Airway  Additional Equipment: None  Intra-op Plan:   Post-operative Plan:   Informed Consent: I have reviewed the patients History and Physical, chart, labs and discussed the procedure including the risks, benefits and alternatives for the proposed anesthesia with the patient or authorized representative who has indicated his/her understanding and acceptance.     Dental advisory given  Plan Discussed with: CRNA  Anesthesia Plan Comments:         Anesthesia Quick Evaluation

## 2022-03-23 NOTE — Op Note (Signed)
OPERATIVE REPORT Patient name: Dennis Harris MRN: [id number] DOB: 1966-06-27  DOS:  03/23/22  Preop Dx: Septic arthritis of left ankle, due to unspecified organism (HCC) Postop Dx: same  Procedure:  1. Left ankle I&D with bone biopsy of left talus and tibia  Surgeon: Louann Sjogren, DPM  Anesthesia: 50-50 mixture of 2% lidocaine plain with 0.5% Marcaine plain totaling 20 infiltrated in the patient's left lower extremity  Hemostasis: No TQ necessary   EBL: <10  mL Materials: None Injectables: as above Pathology: Culture joint fluid x2.  Left tibial biopsy sent for pathology and culture. Left talus biopsy sent for pathology and culture   Condition: The patient tolerated the procedure and anesthesia well. No complications noted or reported   Justification for procedure: The patient is a 56 y.o. male who presents today for left ankle septic arthritis.  All conservative modalities of been unsuccessful in providing any sort of satisfactory alleviation of symptoms with the patient. The patient was told benefits as well as possible side effects of the surgery. The patient consented for surgical correction. The patient consent form was reviewed. All patient questions were answered. No guarantees were expressed or implied. The patient and the surgeon both signed the patient consent form with the witness present and placed in the patient's chart.   Procedure in Detail: The patient was brought to the operating room, placed in the operating table in the supine position at which time an aseptic scrub and drape were performed about the patient's respective lower extremity after anesthesia was induced as described above. Attention was then directed to the surgical area where procedure number one commenced.  Procedure #1:  Attention was then directed to the dorsal anterior ankle over the medial tibiotalar joint.   A 4 cm incision was made over the ankle joint with a #15 blade through skin and  subcutaneous layers. Blunt dissection was used down to the tibiotalar joint.   Anterior to ankle joint a small amount serous fluid was noted about 3 cc. This was swabbed and sent for culture. Upon further dissection and arthrotomy of tibiotalar joint additional fluid that appeared mostly seropurulent drainage was encountered and swabbed for culture. This was about another 3 cc noted. The ankle joint was then decompressed and subsequently lavaged with 3 L normal saline.  . A bone biopsy was then preformed with a Jamshidi needle into the distal anterior tibia. The bone extracted was then sent for pathology and culture. The same was repeated in the anterior talus. A sample was sent for pathology and one for culture. The area was irrigated copiously again. The skin was then closed with 3-0 nylon     Dry sterile compressive dressings were then applied to all previously mentioned incision sites about the patient's lower extremity. The patient was then transferred from the operating room to the recovery room having tolerated the procedure and anesthesia well. All vital signs are stable. After a brief stay in the recovery room the patient was readmitted to the floor.    Disposition:  Patient following with infectious disease and appreciate their recommendations following cultures. Patient will be ok for discharge from podiatry standpoint and will follow-up in clinic with [place].    Louann Sjogren, DPM [facility]  Dr. Louann Sjogren, DPM    [address]  [place], [place] [address]                Office [phone]  Fax [phone]   

## 2022-03-23 NOTE — Transfer of Care (Signed)
Immediate Anesthesia Transfer of Care Note  Patient: Dennis Harris  Procedure(s) Performed: IRRIGATION AND DEBRIDEMENT EXTREMITY (Left)  Patient Location: PACU  Anesthesia Type:MAC and Regional  Level of Consciousness: awake and oriented  Airway & Oxygen Therapy: Patient Spontanous Breathing  Post-op Assessment: Report given to RN  Post vital signs: Reviewed and stable  Last Vitals:  Vitals Value Taken Time  BP 141/82 03/23/22 1809  Temp 36.7 C 03/23/22 1805  Pulse 85 03/23/22 1810  Resp 17 03/23/22 1810  SpO2 99 % 03/23/22 1810  Vitals shown include unvalidated device data.  Last Pain:  Vitals:   03/23/22 1433  TempSrc:   PainSc: 4       Patients Stated Pain Goal: 0 (79/89/21 1941)  Complications: No notable events documented.

## 2022-03-23 NOTE — Progress Notes (Signed)
PT Cancellation Note  Patient Details Name: TAMEEM PULLARA MRN: [id number] DOB: 02/23/1966   Cancelled Treatment:    Reason Eval/Treat Not Completed: Patient not medically ready Patient prefers to defer PT evaluation until after surgery this PM for any post op restrictions. Will attempt PT evaluation post op.   Amenah Tucci A. Dan Humphreys PT, DPT Acute Rehabilitation Services Office [phone]    Viviann Spare 03/23/2022, 12:55 PM

## 2022-03-23 NOTE — Brief Op Note (Signed)
03/22/2022 - 03/23/2022  4:07 PM  PATIENT:  Dennis Harris  57 y.o. male  PRE-OPERATIVE DIAGNOSIS:  Left  ankle septic arthritis  POST-OPERATIVE DIAGNOSIS:  Same as pre-operative dx.   PROCEDURE:  Procedure(s): IRRIGATION AND DEBRIDEMENT EXTREMITY (Left)  SURGEON:  Surgeon(s) and Role:    * Louann Sjogren, DPM - Primary  PHYSICIAN ASSISTANT:   ASSISTANTS: none   ANESTHESIA:   choice  EBL:  <10 ml   BLOOD ADMINISTERED:none  DRAINS: none   LOCAL MEDICATIONS USED:  MARCAINE   , LIDOCAINE , and Amount: 20 ml  SPECIMEN:  Source of Specimen:  Left ankle culture x2 , Left distal tibial  bone biopsy obtained sent for pathology and culture. Left talar bone biopsy obtained sent for culture and pathology.   DISPOSITION OF SPECIMEN:  PATHOLOGY and culture  COUNTS:  YES  TOURNIQUET:  * No tourniquets in log *  DICTATION: .Note written in EPIC  PLAN OF CARE: Admit to inpatient   PATIENT DISPOSITION:  PACU - hemodynamically stable.   Delay start of Pharmacological VTE agent (>24hrs) due to surgical blood loss or risk of bleeding: yes

## 2022-03-23 NOTE — Plan of Care (Signed)
Came from ED last night, pain 10/10 in left ankle and BP 181/94. MD informed, pain med given. BP rechecked 152/80. NPO at [facility] for I/D today. MRSA swab sent.  Problem: Pain Managment: Goal: General experience of comfort will improve Outcome: Progressing

## 2022-03-23 NOTE — TOC Benefit Eligibility Note (Signed)
Patient Product/process development scientist completed.    The patient is currently admitted and upon discharge could be taking Farxiga 10 mg.  Requires Prior Authorization  The patient is currently admitted and upon discharge could be taking Jardiance 10 mg.  Requires Prior Authorization  The patient is insured through [employer]     Roland Earl, CPhT Pharmacy Patient Advocate Specialist [facility] Patient Advocate Team Direct Number: [phone]  Fax: [phone]

## 2022-03-23 NOTE — Telephone Encounter (Signed)
Pharmacy Patient Advocate Encounter  Insurance verification completed.    The patient is insured through [employer]   The patient is currently admitted and ran test claims for the following: Farxiga, Jardiance.  Copays and coinsurance results were relayed to Inpatient clinical team.

## 2022-03-23 NOTE — Progress Notes (Signed)
Patient arrived back to 6 north room 28 alert and oriented x4. Pain level 2/10. Bed in lowest position. Call light in reach. Will continue to monitor pt.

## 2022-03-23 NOTE — Progress Notes (Signed)
OT Cancellation Note  Patient Details Name: Dennis Harris MRN: [id number] DOB: 03/05/1966   Cancelled Treatment:    Reason Eval/Treat Not Completed: Patient not medically ready: Pt is NPO and scheduled for surgical debridement today. Discussed with pt who agreed to hold OT until post-surgery in order to determine any post-op restrictions which may change recommendations for home or post-acute rehab.  Plan to see tomorrow.   Theodoro Clock 03/23/2022, 11:27 AM

## 2022-03-23 NOTE — Progress Notes (Signed)
Report called to short stay. Nurse verbalized understanding of report and had no further questions. Pre procedure checklist completed, consent signed, boxer removed before transfer, patients phone left in room drawer. Patient transferred to short stay via bed by transportation staff.

## 2022-03-23 NOTE — Consult Note (Signed)
Date of Admission:  03/22/2022          Reason for Consult: Septic arthritis with possible osteomyelitis of left ankle   Referring Provider: Janit Pagan, MD   Assessment:  Left Ankle septic arthritis and osteomyelitis in patient recently treated for: Left septic ankle with stenotrophomonas maltophilia isolated (EGGARTELLA CATENAFORMIS also isolated but was not targetted) sp minoyclin and Bactrim Rheumatoid arthritis on methotrexate and prednisone  Plan:  Please continue to hold antibiotics until we have operative specimens I hope they are sent for culture and sensitivity. We will definitely plan on targeting the EGGARTELLA CATENAFORMIS this time possibly with flagyl vs appropriately active beta lactam (not CTX)  Principal Problem:   Septic arthritis of ankle (HCC) Active Problems:   Rheumatoid arthritis (HCC)   Hyperlipidemia   Tobacco abuse   Hypertension   Microcytic anemia   Osteomyelitis (HCC)   HFrEF (heart failure with reduced ejection fraction) (HCC)   Scheduled Meds:  acetaminophen  1,000 mg Oral Q6H   amLODipine  10 mg Oral Daily   atorvastatin  40 mg Oral Daily   carvedilol  3.125 mg Oral BID   folic acid  1 mg Oral Daily   gabapentin  600 mg Oral QHS   ibuprofen  600 mg Oral Q6H   traMADol  50 mg Oral Q6H   Continuous Infusions: PRN Meds:.morphine injection, ondansetron, senna-docusate  HPI: Dennis Harris is a 56 y.o. male with rheumatoid arthritis on methotrexate and prednisone managed by Sheliah Hatch, MD who developed septic native left ankle arthritis.  He underwent surgery by Dr. Ralene Cork.  Reportedly stenotrophomonas was isolated in June but sensitivities not available?  Cultures sent from surgery on July 10 did grow EGGARTELLA CATENAFORMIS howevere this was not targetted.  Patient was treated minocycline and Bactrim I believe because there were no sensitivities available for the stenotrophomonas.  He had improved and was followed  closely by my partner Dr. Thedore Mins.  He did have some swelling in his left ankle when he last saw Dr. Thedore Mins and was sent to the ER for Dopplers to rule out deep venous thrombosis. Apparently pain in the ankle worsened and he had an MRI performed  This showed interval decrease in the talonavicular joint fluid with resolution of the prior rim-enhancing fluid the previous extended to the talonavicular joint.  There was however still small amount of synovium enhancing and joint fluid extending dorsally from the talonavicular joint concerning for septic arthritis.  Additionally there is now subchondral marrow edema concerning for osteomyelitis in addition there is a new moderate tibiotalar joint effusion with moderate thickening and enhancement of the synovium consistent for septic arthritis as well as tibial and talar subchondral marrow edema concerning for osteomyelitis and mild posterior tibial tenosynovitis A  Has been admitted for surgery unfortunately antibiotics have been held.  I believe that the may have indeed been a pathogen there are reports of it as an emerging pathogen and it is not sensitive to sulfa or tetracyclines so it would not of been targeted with his prior therapy the finding of osteomyelitis in the ankle is quite discontented starting.  Osteomyelitis in this region is not typically curable without a below the knee amputation.  The patient is not already for such a life-changing event at this point in time.  Depending on what organisms we isolate we may go with IV antibiotics but certainly will be this will be followed by chronic oral antibiotics in the context of his osteomyelitis  I spent 122 minutes with the patient including than 50% of the time in face to face counseling of the patient has septic arthritis osteomyelitis, personally reviewing my of the ankle along with review of medical records in preparation for the visit and during the visit and in coordination of his  care.     s Review of Systems: Review of Systems  Constitutional:  Negative for chills, diaphoresis, fever, malaise/fatigue and weight loss.  HENT:  Negative for congestion, hearing loss, sore throat and tinnitus.   Eyes:  Negative for blurred vision and double vision.  Respiratory:  Negative for cough, sputum production, shortness of breath and wheezing.   Cardiovascular:  Negative for chest pain, palpitations and leg swelling.  Gastrointestinal:  Negative for abdominal pain, blood in stool, constipation, diarrhea, heartburn, melena, nausea and vomiting.  Genitourinary:  Negative for dysuria, flank pain and hematuria.  Musculoskeletal:  Positive for joint pain and myalgias. Negative for back pain and falls.  Skin:  Negative for itching and rash.  Neurological:  Negative for dizziness, sensory change, focal weakness, loss of consciousness, weakness and headaches.  Endo/Heme/Allergies:  Does not bruise/bleed easily.  Psychiatric/Behavioral:  Negative for depression, memory loss and suicidal ideas. The patient is not nervous/anxious.     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   Exertional shortness of breath    "at times" (11/14/2012)   Hypercholesteremia    Hypertension    Pericarditis ~ 2009   Pneumonia    Rheumatoid arthritis(714.0)     Social History   Tobacco Use   Smoking status: Every Day    Packs/day: 0.50    Years: 37.00    Total pack years: 18.50    Types: Cigarettes   Smokeless tobacco: Never  Vaping Use   Vaping Use: Never used  Substance Use Topics   Alcohol use: Yes    Alcohol/week: 14.0 standard drinks of alcohol    Types: 14 Cans of beer per week    Comment: 2-3 12 ounce cans of beer per day   Drug use: Not Currently    Types: Marijuana    Comment: 12/13/2016 "nothing in years"    Family History  Problem Relation Age of Onset   Cancer Mother    Hypertension Mother    Stroke Mother    Diabetes  Mother    Cirrhosis Father    Lupus Sister    Lupus Sister    Breast cancer Sister    Allergies  Allergen Reactions   Bee Venom Anaphylaxis   Shellfish Allergy Anaphylaxis and Hives    OBJECTIVE: Blood pressure (!) 140/77, pulse 66, temperature (!) 97.5 F (36.4 C), temperature source Oral, resp. rate 16, height 5\' 11"  (1.803 m), weight 72.6 kg, SpO2 100 %.  Physical Exam Constitutional:      Appearance: He is well-developed.  HENT:     Head: Normocephalic and atraumatic.  Eyes:     Conjunctiva/sclera: Conjunctivae normal.  Cardiovascular:     Rate and Rhythm: Normal rate and regular rhythm.  Pulmonary:     Effort: Pulmonary effort is normal. No respiratory distress.     Breath sounds: No wheezing.  Abdominal:     General: There is no distension.     Palpations: Abdomen is soft.  Musculoskeletal:        General: Swelling and tenderness present.     Cervical back: Normal range of motion and neck supple.  Comments: Left ankle is exquisitely tender to palpation  Skin:    General: Skin is warm and dry.     Coloration: Skin is not pale.     Findings: No erythema or rash.  Neurological:     General: No focal deficit present.     Mental Status: He is alert and oriented to person, place, and time.  Psychiatric:        Mood and Affect: Mood normal.        Behavior: Behavior normal.        Thought Content: Thought content normal.        Judgment: Judgment normal.     Lab Results Lab Results  Component Value Date   WBC 6.9 03/23/2022   HGB 9.7 (L) 03/23/2022   HCT 30.3 (L) 03/23/2022   MCV 78.1 (L) 03/23/2022   PLT 412 (H) 03/23/2022    Lab Results  Component Value Date   CREATININE 1.04 03/23/2022   BUN 10 03/23/2022   NA 133 (L) 03/23/2022   K 4.0 03/23/2022   CL 101 03/23/2022   CO2 21 (L) 03/23/2022    Lab Results  Component Value Date   ALT 10 03/07/2022   AST 13 03/07/2022   ALKPHOS 72 02/05/2022   BILITOT 0.2 03/07/2022      Microbiology: Recent Results (from the past 240 hour(s))  Culture, blood (routine x 2)     Status: None (Preliminary result)   Collection Time: 03/22/22  6:36 PM   Specimen: BLOOD  Result Value Ref Range Status   Specimen Description BLOOD RIGHT ANTECUBITAL  Final   Special Requests   Final    BOTTLES DRAWN AEROBIC AND ANAEROBIC Blood Culture results may not be optimal due to an excessive volume of blood received in culture bottles   Culture   Final    NO GROWTH < 12 HOURS Performed at [facility], [address] [place], [place] [address]    Report Status PENDING  Incomplete  Culture, blood (routine x 2)     Status: None (Preliminary result)   Collection Time: 03/22/22  6:44 PM   Specimen: BLOOD  Result Value Ref Range Status   Specimen Description BLOOD BLOOD RIGHT FOREARM  Final   Special Requests   Final    BOTTLES DRAWN AEROBIC AND ANAEROBIC Blood Culture results may not be optimal due to an excessive volume of blood received in culture bottles   Culture   Final    NO GROWTH < 12 HOURS Performed at [facility], [address] [place], [place] [address]    Report Status PENDING  Incomplete  Surgical pcr screen     Status: None   Collection Time: 03/23/22  4:04 AM   Specimen: Nasal Mucosa; Nasal Swab  Result Value Ref Range Status   MRSA, PCR NEGATIVE NEGATIVE Final   Staphylococcus aureus NEGATIVE NEGATIVE Final    Comment: (NOTE) The Xpert SA Assay (FDA approved for NASAL specimens in patients 41 years of age and older), is one component of a comprehensive surveillance program. It is not intended to diagnose infection nor to guide or monitor treatment. Performed at [facility], [address] [place], [place] [address]     Acey Lav, MD [facility] [phone] pager  03/23/2022, 1:33 PM

## 2022-03-23 NOTE — Interval H&P Note (Signed)
History and Physical Interval Note:  03/23/2022 4:03 PM  Dennis Harris  has presented today for surgery, with the diagnosis of Left  ankle septic arthritis.  The various methods of treatment have been discussed with the patient and family. After consideration of risks, benefits and other options for treatment, the patient has consented to  Procedure(s): IRRIGATION AND DEBRIDEMENT EXTREMITY (Left) as a surgical intervention.  The patient's history has been reviewed, patient examined, no change in status, stable for surgery.  I have reviewed the patient's chart and labs.  Questions were answered to the patient's satisfaction.     Louann Sjogren

## 2022-03-24 ENCOUNTER — Encounter (HOSPITAL_COMMUNITY): Payer: Self-pay | Admitting: Podiatry

## 2022-03-24 ENCOUNTER — Telehealth (HOSPITAL_COMMUNITY): Payer: Self-pay | Admitting: Pharmacy Technician

## 2022-03-24 ENCOUNTER — Other Ambulatory Visit (HOSPITAL_COMMUNITY): Payer: Self-pay

## 2022-03-24 DIAGNOSIS — M00079 Staphylococcal arthritis, unspecified ankle and foot: Secondary | ICD-10-CM | POA: Diagnosis not present

## 2022-03-24 DIAGNOSIS — I10 Essential (primary) hypertension: Secondary | ICD-10-CM | POA: Diagnosis not present

## 2022-03-24 DIAGNOSIS — E785 Hyperlipidemia, unspecified: Secondary | ICD-10-CM | POA: Diagnosis not present

## 2022-03-24 DIAGNOSIS — M86079 Acute hematogenous osteomyelitis, unspecified ankle and foot: Secondary | ICD-10-CM | POA: Diagnosis not present

## 2022-03-24 DIAGNOSIS — Z720 Tobacco use: Secondary | ICD-10-CM

## 2022-03-24 DIAGNOSIS — M009 Pyogenic arthritis, unspecified: Secondary | ICD-10-CM | POA: Diagnosis not present

## 2022-03-24 DIAGNOSIS — I5020 Unspecified systolic (congestive) heart failure: Secondary | ICD-10-CM | POA: Diagnosis not present

## 2022-03-24 DIAGNOSIS — M05772 Rheumatoid arthritis with rheumatoid factor of left ankle and foot without organ or systems involvement: Secondary | ICD-10-CM | POA: Diagnosis not present

## 2022-03-24 LAB — BASIC METABOLIC PANEL
Anion gap: 8 (ref 5–15)
BUN: 9 mg/dL (ref 6–20)
CO2: 23 mmol/L (ref 22–32)
Calcium: 8.9 mg/dL (ref 8.9–10.3)
Chloride: 103 mmol/L (ref 98–111)
Creatinine, Ser: 1.11 mg/dL (ref 0.61–1.24)
GFR, Estimated: >60 mL/min (ref >60)
Glucose, Bld: 98 mg/dL (ref 70–99)
Potassium: 4 mmol/L (ref 3.5–5.1)
Sodium: 134 mmol/L — ABNORMAL LOW (ref 135–145)

## 2022-03-24 LAB — CBC
HCT: 29.8 % — ABNORMAL LOW (ref 39.0–52.0)
Hemoglobin: 9.3 g/dL — ABNORMAL LOW (ref 13.0–17.0)
MCH: 24.7 pg — ABNORMAL LOW (ref 26.0–34.0)
MCHC: 31.2 g/dL (ref 30.0–36.0)
MCV: 79.3 fL — ABNORMAL LOW (ref 80.0–100.0)
Platelets: 388 10*3/uL (ref 150–400)
RBC: 3.76 MIL/uL — ABNORMAL LOW (ref 4.22–5.81)
RDW: 15.7 % — ABNORMAL HIGH (ref 11.5–15.5)
WBC: 9.4 10*3/uL (ref 4.0–10.5)
nRBC: 0 % (ref 0.0–0.2)

## 2022-03-24 MED ORDER — ENOXAPARIN SODIUM 40 MG/0.4ML IJ SOSY
40.0000 mg | PREFILLED_SYRINGE | Freq: Every day | INTRAMUSCULAR | Status: DC
  Administered 2022-03-24 – 2022-03-25 (×2): 40 mg via SUBCUTANEOUS
  Filled 2022-03-24 (×2): qty 0.4

## 2022-03-24 MED ORDER — IBUPROFEN 600 MG PO TABS
600.0000 mg | ORAL_TABLET | Freq: Four times a day (QID) | ORAL | Status: DC | PRN
  Administered 2022-03-25 (×2): 600 mg via ORAL
  Filled 2022-03-24 (×2): qty 1

## 2022-03-24 MED ORDER — PROPOFOL 1000 MG/100ML IV EMUL
INTRAVENOUS | Status: AC
  Filled 2022-03-24: qty 100

## 2022-03-24 MED ORDER — LOSARTAN POTASSIUM 25 MG PO TABS
25.0000 mg | ORAL_TABLET | Freq: Every day | ORAL | Status: DC
  Administered 2022-03-25: 25 mg via ORAL
  Filled 2022-03-24: qty 1

## 2022-03-24 MED ORDER — PHENYLEPHRINE HCL-NACL 20-0.9 MG/250ML-% IV SOLN
INTRAVENOUS | Status: AC
  Filled 2022-03-24: qty 250

## 2022-03-24 MED ORDER — TRAMADOL HCL 50 MG PO TABS
50.0000 mg | ORAL_TABLET | Freq: Four times a day (QID) | ORAL | Status: DC
  Administered 2022-03-24 – 2022-03-25 (×5): 50 mg via ORAL
  Filled 2022-03-24 (×5): qty 1

## 2022-03-24 MED ORDER — AMPICILLIN-SULBACTAM SODIUM 3 (2-1) G IJ SOLR
3.0000 g | Freq: Four times a day (QID) | INTRAMUSCULAR | Status: DC
  Administered 2022-03-24 – 2022-03-25 (×5): 3 g via INTRAVENOUS
  Filled 2022-03-24 (×5): qty 8

## 2022-03-24 MED ORDER — MINOCYCLINE HCL 50 MG PO CAPS
200.0000 mg | ORAL_CAPSULE | Freq: Two times a day (BID) | ORAL | Status: DC
  Administered 2022-03-24 – 2022-03-25 (×3): 200 mg via ORAL
  Filled 2022-03-24 (×4): qty 4
  Filled 2022-03-24: qty 2
  Filled 2022-03-24: qty 4

## 2022-03-24 NOTE — Telephone Encounter (Signed)
Patient Advocate Encounter  Prior Authorization for Jardiance 10MG  tablets has been approved.    PA#  Effective dates: 03/24/2022 through 03/24/2023 Key: [id number] Patients co-pay is $0.00.     03/26/2023, CPhT Pharmacy Patient Advocate Specialist [facility] Patient Advocate Team Direct Number: [phone]  Fax: [phone]

## 2022-03-24 NOTE — Progress Notes (Signed)
Subjective: No new complaints   Antibiotics:  Anti-infectives (From admission, onward)    Start     Dose/Rate Route Frequency Ordered Stop   03/24/22 1030  minocycline (MINOCIN) capsule 200 mg        200 mg Oral 2 times daily 03/24/22 0941     03/24/22 1030  Ampicillin-Sulbactam (UNASYN) 3 g in sodium chloride 0.9 % 100 mL IVPB        3 g 200 mL/hr over 30 Minutes Intravenous Every 6 hours 03/24/22 0941         Medications: Scheduled Meds:  acetaminophen  1,000 mg Oral Q6H   amLODipine  10 mg Oral Daily   atorvastatin  40 mg Oral Daily   carvedilol  3.125 mg Oral BID   enoxaparin (LOVENOX) injection  40 mg Subcutaneous Daily   folic acid  1 mg Oral Daily   gabapentin  600 mg Oral QHS   minocycline  200 mg Oral BID   traMADol  50 mg Oral Q6H   Continuous Infusions:  ampicillin-sulbactam (UNASYN) IV 3 g (03/24/22 1551)   PRN Meds:.ibuprofen, morphine injection, ondansetron, senna-docusate    Objective: Weight change:   Intake/Output Summary (Last 24 hours) at 03/24/2022 1600 Last data filed at 03/24/2022 1107 Gross per 24 hour  Intake 385.66 ml  Output 705 ml  Net -319.34 ml   Blood pressure (!) 146/72, pulse 71, temperature 97.6 F (36.4 C), temperature source Oral, resp. rate 17, height 5\' 11"  (1.803 m), weight 72.6 kg, SpO2 99 %. Temp:  [97.1 F (36.2 C)-98.4 F (36.9 C)] 97.6 F (36.4 C) (08/31 0812) Pulse Rate:  [71-92] 71 (08/31 0812) Resp:  [13-19] 17 (08/31 0812) BP: (122-191)/(70-101) 146/72 (08/31 0812) SpO2:  [96 %-100 %] 99 % (08/31 [id number])  Physical Exam: Physical Exam Constitutional:      Appearance: He is well-developed.  HENT:     Head: Normocephalic and atraumatic.  Eyes:     Conjunctiva/sclera: Conjunctivae normal.  Cardiovascular:     Rate and Rhythm: Normal rate and regular rhythm.  Pulmonary:     Effort: Pulmonary effort is normal. No respiratory distress.     Breath sounds: No wheezing.  Abdominal:     General: There  is no distension.     Palpations: Abdomen is soft.  Musculoskeletal:     Cervical back: Normal range of motion and neck supple.  Skin:    Findings: No erythema or rash.  Neurological:     General: No focal deficit present.     Mental Status: He is alert and oriented to person, place, and time.  Psychiatric:        Mood and Affect: Mood normal.        Behavior: Behavior normal.        Thought Content: Thought content normal.        Judgment: Judgment normal.      Left ankle dressed  CBC:    BMET Recent Labs    03/23/22 0235 03/24/22 0108  NA 133* 134*  K 4.0 4.0  CL 101 103  CO2 21* 23  GLUCOSE 98 98  BUN 10 9  CREATININE 1.04 1.11  CALCIUM 8.9 8.9     Liver Panel  No results for input(s): "PROT", "ALBUMIN", "AST", "ALT", "ALKPHOS", "BILITOT", "BILIDIR", "IBILI" in the last 72 hours.     Sedimentation Rate Recent Labs    03/22/22 1323  ESRSEDRATE 105*   C-Reactive Protein Recent Labs  03/22/22 1323  CRP 6.2*    Micro Results: Recent Results (from the past 720 hour(s))  Culture, blood (routine x 2)     Status: None (Preliminary result)   Collection Time: 03/22/22  6:36 PM   Specimen: BLOOD  Result Value Ref Range Status   Specimen Description BLOOD RIGHT ANTECUBITAL  Final   Special Requests   Final    BOTTLES DRAWN AEROBIC AND ANAEROBIC Blood Culture results may not be optimal due to an excessive volume of blood received in culture bottles   Culture   Final    NO GROWTH 2 DAYS Performed at [facility], [address] [place], [place] [address]    Report Status PENDING  Incomplete  Culture, blood (routine x 2)     Status: None (Preliminary result)   Collection Time: 03/22/22  6:44 PM   Specimen: BLOOD  Result Value Ref Range Status   Specimen Description BLOOD BLOOD RIGHT FOREARM  Final   Special Requests   Final    BOTTLES DRAWN AEROBIC AND ANAEROBIC Blood Culture results may not be optimal due to an excessive volume of blood  received in culture bottles   Culture   Final    NO GROWTH 2 DAYS Performed at [facility], [address], [place], [place] [address]    Report Status PENDING  Incomplete  Surgical pcr screen     Status: None   Collection Time: 03/23/22  4:04 AM   Specimen: Nasal Mucosa; Nasal Swab  Result Value Ref Range Status   MRSA, PCR NEGATIVE NEGATIVE Final   Staphylococcus aureus NEGATIVE NEGATIVE Final    Comment: (NOTE) The Xpert SA Assay (FDA approved for NASAL specimens in patients 44 years of age and older), is one component of a comprehensive surveillance program. It is not intended to diagnose infection nor to guide or monitor treatment. Performed at [facility], [address] [place], [place] [address]   Aerobic/Anaerobic Culture w Gram Stain (surgical/deep wound)     Status: None (Preliminary result)   Collection Time: 03/23/22  4:55 PM   Specimen: PATH Bone biopsy; Tissue  Result Value Ref Range Status   Specimen Description WOUND  Final   Special Requests LEFT ANKLE JOINT FLUID SPEC A  Final   Gram Stain NO WBC SEEN NO ORGANISMS SEEN   Final   Culture   Final    NO GROWTH < 24 HOURS Performed at [facility], [address], [place], [place] [address]    Report Status PENDING  Incomplete  Aerobic/Anaerobic Culture w Gram Stain (surgical/deep wound)     Status: None (Preliminary result)   Collection Time: 03/23/22  5:41 PM   Specimen: PATH Bone biopsy; Tissue  Result Value Ref Range Status   Specimen Description WOUND  Final   Special Requests LEFT ANKLE JOINT SPEC B  Final   Gram Stain   Final    FEW WBC PRESENT, PREDOMINANTLY PMN NO ORGANISMS SEEN    Culture   Final    NO GROWTH < 12 HOURS Performed at [facility], [address] [place], [place] [address]    Report Status PENDING  Incomplete  Aerobic/Anaerobic Culture w Gram Stain (surgical/deep wound)     Status: None (Preliminary result)   Collection Time: 03/23/22  5:47 PM    Specimen: PATH Bone biopsy; Tissue  Result Value Ref Range Status   Specimen Description WOUND  Final   Special Requests LEFT  DISTAL TIBIA  Final   Gram Stain NO WBC SEEN NO ORGANISMS SEEN   Final   Culture   Final    NO GROWTH < 12 HOURS Performed at [facility], [address] [place], [place] [address]    Report Status PENDING  Incomplete  Aerobic/Anaerobic Culture w Gram Stain (surgical/deep wound)     Status: None (Preliminary result)   Collection Time: 03/23/22  5:51 PM   Specimen: PATH Bone biopsy; Tissue  Result Value Ref Range Status   Specimen Description WOUND  Final   Special Requests LEFT TALUS SPEC D  Final   Gram Stain NO WBC SEEN NO ORGANISMS SEEN   Final   Culture   Final    NO GROWTH < 12 HOURS Performed at [facility], [address] [place], [place] [address]    Report Status PENDING  Incomplete    Studies/Results: No results found.    Assessment/Plan:  INTERVAL HISTORY: Cultures so far unrevealing but fairly young   Principal Problem:   Septic arthritis of ankle (HCC) Active Problems:   Rheumatoid arthritis (HCC)   Hyperlipidemia   Tobacco abuse   Hypertension   Microcytic anemia   Osteomyelitis (HCC)   HFrEF (heart failure with reduced ejection fraction) (HCC)    DEMBA NIGH is a 56 y.o. male with history of rheumatoid arthritis on methotrexate and prednisone with history of septic arthritis with Stenotrophomonas maltophilia isolated on PCR assay that did not show evidence of resistance mutations and  EGGARTELLA CATENAFORMIS isolated on operative culture in July, now with recurrent persistent septic arthritis and osteomyelitis status arthroscopic surgery and bone biopsy with cultures having been taken.  #1 Septic arthritis with osteomyelitis:  Cultures so far are unrevealing but young  We have started Unasyn to cover the  And minocycline to cover the Stenotrophomonas for now  The types of medicines are isolated  and what types of antibiotics we  would use will certainly be influencing the decision to go with IV antibiotics immediately versus oral antibiotics I told the patient and his wife that he will need chronic oral antibiotics given his osteomyelitis and that he ultimately may need a below the knee amputation for cure.   He already has a followup with Dr. Thedore Mins upcoming  I spent 51  minutes with the patient including than 50% of the time in face to face counseling of the patient and his wife regarding the nature of septic arthritis osteomyelitis the organisms have been isolated,  along with review of medical records in preparation for the visit and during the visit and in coordination of his  care.       LOS: 1 day   Acey Lav 03/24/2022, 4:00 PM

## 2022-03-24 NOTE — Progress Notes (Signed)
Orthopedic Tech Progress Note Patient Details:  Dennis Harris 20-Jul-1966 [id number]  Adjusted for pt but left CAM boot at bedside for use when OOB.  Ortho Devices Type of Ortho Device: CAM walker Ortho Device/Splint Location: LLE Ortho Device/Splint Interventions: Ordered, Adjustment   Post Interventions Patient Tolerated: Fair Instructions Provided: Adjustment of device, Care of device  [name] 03/24/2022, 12:38 PM

## 2022-03-24 NOTE — Progress Notes (Signed)
Subjective:  Patient ID: Dennis Harris, male    DOB: 1966/01/03,  MRN: [id number]  Patient seen at bedside this morning s/p left ankle I&D and bone biopsy. Relates he is doing well and his pain is better. Denies any n/v/f/c.   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   Exertional shortness of breath    "at times" (11/14/2012)   Hypercholesteremia    Hypertension    Pericarditis ~ 2009   Pneumonia    Rheumatoid arthritis(714.0)      Past Surgical History:  Procedure Laterality Date   ANTERIOR CERVICAL DECOMP/DISCECTOMY FUSION N/A 11/14/2012   Procedure: ANTERIOR CERVICAL DECOMPRESSION/DISCECTOMY FUSION 1 LEVEL C5-6;  Surgeon: Venita Lick, MD;  Location: [facility] OR;  Service: Orthopedics;  Laterality: N/A;   APPENDECTOMY  1970's   BACK SURGERY     CARDIAC CATHETERIZATION  May 2010   normal   I & D EXTREMITY Left 02/02/2022   Procedure: IRRIGATION AND DEBRIDEMENT LEFT FOOT/ANKLE;  Surgeon: Candelaria Stagers, DPM;  Location: [facility] OR;  Service: Podiatry;  Laterality: Left;   JOINT REPLACEMENT     TONSILLECTOMY  1970's?   TOTAL HIP ARTHROPLASTY Right 12/13/2016   TOTAL HIP ARTHROPLASTY Right 12/13/2016   Procedure: RIGHT TOTAL HIP ARTHROPLASTY ANTERIOR APPROACH;  Surgeon: Kathryne Hitch, MD;  Location: [facility] OR;  Service: Orthopedics;  Laterality: Right;       Latest Ref Rng & Units 03/24/2022    1:08 AM 03/23/2022    2:35 AM 03/22/2022    1:23 PM  CBC  WBC 4.0 - 10.5 K/uL 9.4  6.9  8.4   Hemoglobin 13.0 - 17.0 g/dL 9.3  9.7  97.0   Hematocrit 39.0 - 52.0 % 29.8  30.3  33.4   Platelets 150 - 400 K/uL 388  412  460        Latest Ref Rng & Units 03/24/2022    1:08 AM 03/23/2022    2:35 AM 03/22/2022    1:23 PM  BMP  Glucose 70 - 99 mg/dL 98  98  263   BUN 6 - 20 mg/dL 9  10  8    Creatinine 0.61 - 1.24 mg/dL  7.85  8.85   Sodium 135 - 145 mmol/L 134  133  135   Potassium 3.5 - 5.1 mmol/L 4.0  4.0  3.9   Chloride 98 - 111  mmol/L 103  101  103   CO2 22 - 32 mmol/L 23  21  22    Calcium 8.9 - 10.3 mg/dL 8.9  8.9  9.3      Objective:   Vitals:   03/23/22 2353 03/24/22 0410  BP: 122/70 135/74  Pulse: 87 73  Resp: 18 18  Temp: (!) 97.5 F (36.4 C) 97.6 F (36.4 C)  SpO2: 98% 99%    General:AA&O x 3. Normal mood and affect   Vascular: DP and PT pulses 2/4 bilateral. Brisk capillary refill to all digits. Pedal hair present   Neruological. Epicritic sensation grossly intact. No strike through noted.   Derm: Dressing clean dry and intact.   MSK: MMT 5/5 in dorsiflexion, plantar flexion, inversion and eversion. Normal joint ROM without pain or crepitus.      Assessment & Plan:  Patient was evaluated and treated and all questions answered.  S/P left ankle I&D and bone biopsy.  Dressing to remain intact, dry and clean.  Antibiotics: Per primary. Pending culture results.  Appreciate ID recommendations for further antibiotic treatment.  DME: CAM boot   Patient is ok for discharge from podiatry standpoint following cultures and antibiotic plan. He will be seen one week after discharge in our clinic. We will schedule for him.   Louann Sjogren, DPM  Accessible via secure chat for questions or concerns.

## 2022-03-24 NOTE — Progress Notes (Addendum)
Daily Progress Note Intern Pager: [phone]  Patient name: Dennis Harris Medical record number: [id number] Date of birth: 03/17/1966 Age: 56 y.o. Gender: male  Primary Care Provider: Patient, No Pcp Per Consultants: Podiatry, Infectious Disease  Code Status: FULL  Pt Overview and Major Events to Date:  8/29 - admitted  8/30 - surgical debridement   Assessment and Plan: Dennis Harris is a 56 y.o. male with a PMH of RA, HTN, nonischemic cardiomyopathy, AVN of both hips, tobacco abuse who was admitted for septic arthritis and osteomyelitis of the left ankle.  * Septic arthritis of ankle (HCC) Left ankle septic arthritis and osteomyelitis confirmed by MRI, s/p irrigation & debridement with podiatry on 02/02/22 and 4-week course of Bactrim/minocycline. Prior debridement isolated stenotrophomonas maltophilia and eggartella catenaformis. S/p irrigation and debridement with podiatry on 03/23/22 which yielded left ankle culture x2, left distal tibial bone biopsy and left talar bone biopsy. -ID consulted, appreciate care and recommendations -Started on minocycline and Unasyn per ID  -Awaiting bone bx, can narrow abx as able - Pain control (pain score currently 4/10): Tylenol 1000mg  q6h, Tramadol 50mg  q6h, Gabapentin 600mg  qhs, Ibuprofen  600mg  q6h PRN mild pain Morphine 1 mg q4h PRN severe pain  - PT/OT eval and treat  HFrEF (heart failure with reduced ejection fraction) (HCC) H/o nonischemic cardiomyopathy with reduced EF 35-40% in 2021. Lost to cardiology follow up. No evidence of acute CHF currently. - Recommend outpatient cardiology f/u and repeat Echo - Consider starting GDMT (adding ARB, SGLT2) while admitted. -Continue beta blocker   Rheumatoid arthritis (HCC) Chronic.  Managed by rheumatology.  Methotrexate was stopped previously due to kidney function concerns during antibiotics.  Methotrexate dose taken 8/29, taken weekly. Also on prednisone.  - Follow-up outpatient with  rheumatology for medication management - Continue folic acid  Hypertension Home meds previously included: chlorthalidone and losartan.  He was stopped on his medications and started on 10 mg amlodipine last admission due to AKI and ongoing Bactrim use.  He was severely hypertensive on admission likely due to pain, currently normotensive.  - Continue amlodipine 10 mg - Continue Coreg 3.125 mg twice daily - Monitor pain, BP and renal function, consider restarting chlorthalidone or losartan  Microcytic anemia Chronic. Most recent Hb 9.3. Suspect mixed picture of anemia of chronic disease and iron deficiency. Recent iron studies show iron 32, sat ratio 11%. - Hold off on iron supplementation during current infection - Recommend outpatient colorectal cancer screening  Tobacco abuse Patient currently smokes 3 to 4 cigarettes daily. - Nicotine patch offered, patient declined - Cessation counseling  Hyperlipidemia Was lost to cardiology f/u and is not taking statin. ASCVD risk 38% based on lipid panel in 2021, currently 17.4-24% based on lipid panel today  - Continue atorvastatin 40mg  daily   FEN/GI: NPO since midnight  PPx: SCDs, surgery today Dispo:Home in 2-3 days. Barriers include med-tele, pain management post-op, post-op biopsy and blood cultures informing antibiotic regimen, podiatry/PT recs.   Subjective:  He was seen at bedside resting comfortably and is pleasant and conversational. He said his pain is currently manageable at 4/10. Denies CP, SOB, abdominal pain. Afebrile. He has had no urinary difficulties. His last BM was before surgery but he is passing gas post-op. He was unaware of his diagnosis of heart failure but remembers having pericarditis (per chart review, diagnosed with idiopathic pericarditis and treated with colchicine in 2009) and a cardiologist. I educated him on the results of his last Echo in 2021  and that it's imperative that he follows up with cards after discharge  to get on the right medications (hx of management with amlodipine, chlorthalidone, carvedilol and Entresto). He is highly motivated to do so.    Objective: Temp:  [97.1 F (36.2 C)-98.4 F (36.9 C)] 97.6 F (36.4 C) (08/31 0812) Pulse Rate:  [69-92] 71 (08/31 0812) Resp:  [13-19] 17 (08/31 0812) BP: (122-191)/(70-101) 146/72 (08/31 0812) SpO2:  [96 %-100 %] 99 % (08/31 6415)  Physical Exam: General: Alert and oriented, in no apparent distress, pleasant and conversational Cardiovascular: RRR S1 S2 present, no rubs, murmurs, or gallops.  Respiratory: CTA bilaterally, no increased respiratory effort Abdomen: Soft, no masses, no tenderness to palpation, no hepatosplenomegaly.  Extremities: L ankle wrapped post-op. Can wiggle toes and sensation is intact in toes. R ankle sensation and mobility intact.   Laboratory: Most recent CBC Lab Results  Component Value Date   WBC 9.4 03/24/2022   HGB 9.3 (L) 03/24/2022   HCT 29.8 (L) 03/24/2022   MCV 79.3 (L) 03/24/2022   PLT 388 03/24/2022   Most recent BMP    Latest Ref Rng & Units 03/24/2022    1:08 AM  BMP  Glucose 70 - 99 mg/dL 98   BUN 6 - 20 mg/dL 9   Creatinine 8.30 - 9.40 mg/dL 7.68   Sodium 088 - 110 mmol/L 134   Potassium 3.5 - 5.1 mmol/L 4.0   Chloride 98 - 111 mmol/L 103   CO2 22 - 32 mmol/L 23   Calcium 8.9 - 10.3 mg/dL 8.9    X-RAY Left Ankle  My interpretation: No evidence of fracture or deformity. Soft tissue swelling throughout left ankle.    MR ANKLE LEFT W WO CONTRAST  IMPRESSION Compared to 01/23/2022: 1. Interval decrease in talonavicular joint fluid including resolution of the prior rim enhancing fluid that previously extended laterally from the talonavicular joint. There is still a small amount of synovium enhancing and joint fluid extending dorsally from the talonavicular joint. Again this is suspicious for septic arthritis. There is also again subchondral marrow edema suspicious for osteomyelitis. 2.  New moderate tibiotalar joint effusion with new moderate thickening and enhancement of the synovium suspicious for synovitis and septic arthritis. Tibial and talar subchondral marrow edema suspicious for osteomyelitis. 3. Mild posterior tibial tenosynovitis is mildly improved from prior. Mild tendinosis appears similar to prior.   Ellison Hughs, Medical Student 03/24/2022, 1:34 PM [facility] FPTS Intern pager: [phone], text pages welcome Secure chat group [facility] Teaching Service    FPTS Upper-Level Resident Addendum   I have independently interviewed and examined the patient. I have discussed the above with the original author and agree with their documentation. My edits for correction/addition/clarification are included where appropriate. Please see also any attending notes.   Sabino Dick, DO PGY-3, [facility] 03/24/2022 2:01 PM  FPTS Service pager: [phone] (text pages welcome through [facility])

## 2022-03-24 NOTE — Anesthesia Postprocedure Evaluation (Signed)
Anesthesia Post Note  Patient: Dennis Harris  Procedure(s) Performed: IRRIGATION AND DEBRIDEMENT EXTREMITY (Left)     Anesthesia Post Evaluation No notable events documented.  Last Vitals:  Vitals:   03/24/22 0410 03/24/22 0812  BP: 135/74 (!) 146/72  Pulse: 73 71  Resp: 18 17  Temp: 36.4 C 36.4 C  SpO2: 99% 99%    Last Pain:  Vitals:   03/24/22 0812  TempSrc: Oral  PainSc:                  Lewie Loron

## 2022-03-24 NOTE — Progress Notes (Signed)
PT Cancellation Note  Patient Details Name: Dennis Harris MRN: [id number] DOB: 01/19/1966   Cancelled Treatment:    Reason Eval/Treat Not Completed: Other (comment).  Pt was in pain initially then has no Cam Walker yet.  Just ordered for pt and will retry as time and pt allow.   Ivar Drape 03/24/2022, 11:26 AM  Samul Dada, PT PhD Acute Rehab Dept. Number: [facility] [id number] and [facility] [phone]

## 2022-03-24 NOTE — Evaluation (Signed)
Occupational Therapy Evaluation Patient Details Name: Dennis Harris MRN: [id number] DOB: Jul 26, 1965 Today's Date: 03/24/2022   History of Present Illness 56 y.o. male admitted s/p left ankle I&D and bone biopsy on 03/23/22. PMH: HLD, RA, HTN.   Clinical Impression    Pt was admitted as above, presenting with deficits as listed below. He is currently Mod I functional mobility, transfers and basic ADL tasks using RW, keeping LLE off of floor. Pt RN was made aware that pt needs CAM walker for L foot as per chart review/orders. Pt/family were educated in role of OT and denies any further needs at this time as he appears to be at baseline level, will sign off. No further OT recommended at this time.     Recommendations for follow up therapy are one component of a multi-disciplinary discharge planning process, led by the attending physician.  Recommendations may be updated based on patient status, additional functional criteria and insurance authorization.   Follow Up Recommendations  No OT follow up    Assistance Recommended at Discharge Intermittent Supervision/Assistance  Patient can return home with the following A little help with walking and/or transfers;A little help with bathing/dressing/bathroom;Assistance with cooking/housework;Help with stairs or ramp for entrance;Assist for transportation    Functional Status Assessment  Patient has not had a recent decline in their functional status  Equipment Recommendations  None recommended by OT    Recommendations for Other Services       Precautions / Restrictions Precautions Precautions: Fall Precaution Comments: CAM walker Required Braces or Orthoses: Other Brace Other Brace: CAM walker L foot      Mobility Bed Mobility Overal bed mobility: Modified Independent    Transfers Overall transfer level: Modified independent Equipment used: Rolling walker (2 wheels)     General transfer comment: Mod I keeping LLE up off  floor (waiting on CAM walker, spoke with RN whom ordered)     Balance Overall balance assessment: Modified Independent, No apparent balance deficits (not formally assessed)     ADL either performed or assessed with clinical judgement   ADL Overall ADL's : At baseline   General ADL Comments: Pt was educated in role of OT, Eval was performed, he has been using RW for last month at home & is overall Mod I for functional mobility and transfers (bed, 3:1, ambulating in bathroom/room etc) using RW and keeping L LE off of the floor as he waiting on CAM Walker. Pt is Mod I taking bird baths at sink level and he was educated to keep his L LE clean/dry and to not use shower until cleared by MD to do so. He verbalized understanding. He is also baseline Mod I for LB dressing and denies any further acute OT needs at this time. He verbalized that he prefers to use a SPC if able as he has 2 flights of 13 STE to get to his apartment, OT spoke with PT about pt request. Will sign off OT at this time.     Vision Baseline Vision/History: 1 Wears glasses (For reading) Ability to See in Adequate Light: 0 Adequate Patient Visual Report: No change from baseline              Pertinent Vitals/Pain Pain Assessment Pain Assessment: 0-10 Pain Score: 5  Pain Location: L foot Pain Descriptors / Indicators: Sharp, Shooting, Throbbing Pain Intervention(s): Limited activity within patient's tolerance, Monitored during session, Premedicated before session     Hand Dominance Right   Extremity/Trunk Assessment Upper Extremity Assessment  Upper Extremity Assessment: Overall WFL for tasks assessed   Lower Extremity Assessment Lower Extremity Assessment: Defer to PT evaluation   Cervical / Trunk Assessment Cervical / Trunk Assessment: Normal   Communication Communication Communication: No difficulties   Cognition Arousal/Alertness: Awake/alert Behavior During Therapy: WFL for tasks assessed/performed Overall  Cognitive Status: Within Functional Limits for tasks assessed       General Comments  Pt was educated in keeping LLE clean and dry, do not bath until cleared by MD. Sherron Monday with RN staff to order CAM walker as per MD notes for LLE            Home Living Family/patient expects to be discharged to:: Private residence Living Arrangements: Spouse/significant other Available Help at Discharge: Family;Available PRN/intermittently Type of Home: Apartment Home Access: Stairs to enter Entrance Stairs-Number of Steps: 13 x2 Entrance Stairs-Rails: Right;Left Home Layout: One level     Bathroom Shower/Tub: Chief Strategy Officer: Standard Bathroom Accessibility: Yes How Accessible: Accessible via walker Home Equipment: Grab bars - tub/shower;Rolling Walker (2 wheels)   Additional Comments: Pt sits on side of the tub when showering      Prior Functioning/Environment Prior Level of Function : Independent/Modified Independent;Working/employed;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[employer], not currently working)   Mobility Comments: Not currently able to work; ADLs Comments: Independent        OT Problem List: Pain      OT Treatment/Interventions:   Eval only   OT Goals(Current goals can be found in the care plan section) Acute Rehab OT Goals Patient Stated Goal: Go home when able, get better OT Goal Formulation: All assessment and education complete, DC therapy Time For Goal Achievement: 03/24/22 Potential to Achieve Goals: Good  OT Frequency:  Eval only, sign off no needs       AM-PAC OT "6 Clicks" Daily Activity     Outcome Measure Help from another person eating meals?: None Help from another person taking care of personal grooming?: None Help from another person toileting, which includes using toliet, bedpan, or urinal?: None Help from another person bathing (including washing, rinsing, drying)?: None Help from another person to put on and taking off regular  upper body clothing?: None Help from another person to put on and taking off regular lower body clothing?: A Little 6 Click Score: 23   End of Session Equipment Utilized During Treatment: Rolling walker (2 wheels) Nurse Communication: Mobility status;Other (comment) (Need for CAM Walker)  Activity Tolerance: Patient tolerated treatment well;No increased pain Patient left: in bed;with call bell/phone within reach;with nursing/sitter in room;with family/visitor present  OT Visit Diagnosis: Other abnormalities of gait and mobility (R26.89);Pain Pain - Right/Left: Left Pain - part of body: Ankle and joints of foot                Time: 1050-1115 OT Time Calculation (min): 25 min Charges:  OT Evaluation $OT Eval Low Complexity: 1 Low Mirko Tailor Beth Dixon, OTR/L 03/24/2022, 11:36 AM

## 2022-03-25 ENCOUNTER — Telehealth: Payer: Self-pay | Admitting: Surgery

## 2022-03-25 ENCOUNTER — Inpatient Hospital Stay (HOSPITAL_COMMUNITY): Payer: Managed Care, Other (non HMO)

## 2022-03-25 ENCOUNTER — Other Ambulatory Visit (HOSPITAL_COMMUNITY): Payer: Self-pay

## 2022-03-25 DIAGNOSIS — I429 Cardiomyopathy, unspecified: Secondary | ICD-10-CM

## 2022-03-25 DIAGNOSIS — M00879 Arthritis due to other bacteria, unspecified ankle and foot: Secondary | ICD-10-CM | POA: Diagnosis not present

## 2022-03-25 DIAGNOSIS — I110 Hypertensive heart disease with heart failure: Secondary | ICD-10-CM | POA: Diagnosis not present

## 2022-03-25 DIAGNOSIS — M069 Rheumatoid arthritis, unspecified: Secondary | ICD-10-CM | POA: Diagnosis not present

## 2022-03-25 DIAGNOSIS — I5020 Unspecified systolic (congestive) heart failure: Secondary | ICD-10-CM

## 2022-03-25 DIAGNOSIS — E785 Hyperlipidemia, unspecified: Secondary | ICD-10-CM | POA: Diagnosis not present

## 2022-03-25 DIAGNOSIS — M009 Pyogenic arthritis, unspecified: Secondary | ICD-10-CM | POA: Diagnosis not present

## 2022-03-25 DIAGNOSIS — I10 Essential (primary) hypertension: Secondary | ICD-10-CM | POA: Diagnosis not present

## 2022-03-25 DIAGNOSIS — F17210 Nicotine dependence, cigarettes, uncomplicated: Secondary | ICD-10-CM

## 2022-03-25 LAB — ECHOCARDIOGRAM COMPLETE
AR max vel: 2.65 cm2
AV Area VTI: 2.95 cm2
AV Area mean vel: 2.57 cm2
AV Mean grad: 6 mmHg
AV Peak grad: 11.3 mmHg
Ao pk vel: 1.68 m/s
Area-P 1/2: 3.91 cm2
Height: 71 in
S' Lateral: 3.1 cm
Weight: 2560 oz

## 2022-03-25 LAB — SURGICAL PATHOLOGY

## 2022-03-25 MED ORDER — MINOCYCLINE HCL 100 MG PO CAPS
200.0000 mg | ORAL_CAPSULE | Freq: Two times a day (BID) | ORAL | 3 refills | Status: DC
  Filled 2022-03-25: qty 120, 30d supply, fill #0
  Filled 2022-04-19: qty 22, 6d supply, fill #1
  Filled 2022-04-21: qty 98, 24d supply, fill #1
  Filled 2022-06-07: qty 120, 30d supply, fill #1

## 2022-03-25 MED ORDER — GABAPENTIN 300 MG PO CAPS
600.0000 mg | ORAL_CAPSULE | Freq: Every day | ORAL | 0 refills | Status: DC
  Filled 2022-03-25: qty 60, 30d supply, fill #0

## 2022-03-25 MED ORDER — AMOXICILLIN-POT CLAVULANATE 875-125 MG PO TABS
1.0000 | ORAL_TABLET | Freq: Two times a day (BID) | ORAL | Status: DC
  Administered 2022-03-25: 1 via ORAL
  Filled 2022-03-25: qty 1

## 2022-03-25 MED ORDER — AMOXICILLIN-POT CLAVULANATE 875-125 MG PO TABS
1.0000 | ORAL_TABLET | Freq: Two times a day (BID) | ORAL | 3 refills | Status: DC
  Filled 2022-03-25: qty 60, 30d supply, fill #0
  Filled 2022-04-19 – 2022-06-07 (×2): qty 60, 30d supply, fill #1

## 2022-03-25 MED ORDER — OXYCODONE HCL 10 MG PO TABS
10.0000 mg | ORAL_TABLET | Freq: Four times a day (QID) | ORAL | 0 refills | Status: AC | PRN
  Filled 2022-03-25: qty 28, 7d supply, fill #0

## 2022-03-25 MED ORDER — FOLIC ACID 1 MG PO TABS
1.0000 mg | ORAL_TABLET | Freq: Every day | ORAL | 0 refills | Status: DC
  Filled 2022-03-25: qty 30, 30d supply, fill #0
  Filled 2022-04-19: qty 30, 30d supply, fill #1

## 2022-03-25 MED ORDER — OXYCODONE HCL 5 MG PO TABS: 10.0000 mg | ORAL_TABLET | Freq: Four times a day (QID) | ORAL | Status: DC | PRN

## 2022-03-25 MED ORDER — LOSARTAN POTASSIUM 25 MG PO TABS
25.0000 mg | ORAL_TABLET | Freq: Every day | ORAL | 0 refills | Status: DC
  Filled 2022-03-25: qty 30, 30d supply, fill #0

## 2022-03-25 NOTE — Progress Notes (Signed)
Subjective: No new complaints   Antibiotics:  Anti-infectives (From admission, onward)    Start     Dose/Rate Route Frequency Ordered Stop   03/25/22 1630  amoxicillin-clavulanate (AUGMENTIN) 875-125 MG per tablet 1 tablet        1 tablet Oral Every 12 hours 03/25/22 0912     03/25/22 0000  amoxicillin-clavulanate (AUGMENTIN) 875-125 MG tablet        1 tablet Oral 2 times daily 03/25/22 1051     03/25/22 0000  minocycline (DYNACIN) 100 MG tablet        200 mg Oral 2 times daily 03/25/22 1051     03/24/22 1030  minocycline (MINOCIN) capsule 200 mg        200 mg Oral 2 times daily 03/24/22 0941     03/24/22 1030  Ampicillin-Sulbactam (UNASYN) 3 g in sodium chloride 0.9 % 100 mL IVPB  Status:  Discontinued        3 g 200 mL/hr over 30 Minutes Intravenous Every 6 hours 03/24/22 0941 03/25/22 0912       Medications: Scheduled Meds:  acetaminophen  1,000 mg Oral Q6H   amLODipine  10 mg Oral Daily   amoxicillin-clavulanate  1 tablet Oral Q12H   atorvastatin  40 mg Oral Daily   carvedilol  3.125 mg Oral BID   enoxaparin (LOVENOX) injection  40 mg Subcutaneous Daily   folic acid  1 mg Oral Daily   gabapentin  600 mg Oral QHS   losartan  25 mg Oral Daily   minocycline  200 mg Oral BID   traMADol  50 mg Oral Q6H   Continuous Infusions:   PRN Meds:.ibuprofen, morphine injection, ondansetron, senna-docusate    Objective: Weight change:   Intake/Output Summary (Last 24 hours) at 03/25/2022 1057 Last data filed at 03/25/2022 0300 Gross per 24 hour  Intake 900 ml  Output 300 ml  Net 600 ml    Blood pressure (!) 151/80, pulse 80, temperature (!) 97.5 F (36.4 C), temperature source Oral, resp. rate 18, height  (1.803 m), weight 72.6 kg, SpO2 100 %. Temp:  [97.5 F (36.4 C)-98.1 F (36.7 C)] 97.5 F (36.4 C) (09/01 0921) Pulse Rate:  [70-83] 80 (09/01 0921) Resp:  [17-19] 18 (09/01 0921) BP: (119-154)/(59-80) 151/80 (09/01 0921) SpO2:  [94 %-100 %] 100 %  (09/01 0921)  Physical Exam: Physical Exam Constitutional:      Appearance: He is well-developed.  HENT:     Head: Normocephalic and atraumatic.  Eyes:     Conjunctiva/sclera: Conjunctivae normal.  Cardiovascular:     Rate and Rhythm: Normal rate and regular rhythm.  Pulmonary:     Effort: Pulmonary effort is normal. No respiratory distress.     Breath sounds: No wheezing.  Abdominal:     General: There is no distension.     Palpations: Abdomen is soft.  Musculoskeletal:     Cervical back: Normal range of motion and neck supple.  Skin:    General: Skin is warm and dry.     Findings: No erythema or rash.  Neurological:     General: No focal deficit present.     Mental Status: He is alert and oriented to person, place, and time.  Psychiatric:        Mood and Affect: Mood normal.        Behavior: Behavior normal.        Thought Content: Thought content normal.  Judgment: Judgment normal.      Left ankle dressed  CBC:    BMET Recent Labs    03/23/22 0235 03/24/22 0108  NA 133* 134*  K 4.0 4.0  CL 101 103  CO2 21* 23  GLUCOSE 98 98  BUN 10 9  CREATININE 1.04 1.11  CALCIUM 8.9 8.9      Liver Panel  No results for input(s): "PROT", "ALBUMIN", "AST", "ALT", "ALKPHOS", "BILITOT", "BILIDIR", "IBILI" in the last 72 hours.     Sedimentation Rate Recent Labs    03/22/22 1323  ESRSEDRATE 105*    C-Reactive Protein Recent Labs    03/22/22 1323  CRP 6.2*     Micro Results: Recent Results (from the past 720 hour(s))  Culture, blood (routine x 2)     Status: None (Preliminary result)   Collection Time: 03/22/22  6:36 PM   Specimen: BLOOD  Result Value Ref Range Status   Specimen Description BLOOD RIGHT ANTECUBITAL  Final   Special Requests   Final    BOTTLES DRAWN AEROBIC AND ANAEROBIC Blood Culture results may not be optimal due to an excessive volume of blood received in culture bottles   Culture   Final    NO GROWTH 3 DAYS Performed at  [facility], [address] [place], [place] [address]    Report Status PENDING  Incomplete  Culture, blood (routine x 2)     Status: None (Preliminary result)   Collection Time: 03/22/22  6:44 PM   Specimen: BLOOD  Result Value Ref Range Status   Specimen Description BLOOD BLOOD RIGHT FOREARM  Final   Special Requests   Final    BOTTLES DRAWN AEROBIC AND ANAEROBIC Blood Culture results may not be optimal due to an excessive volume of blood received in culture bottles   Culture   Final    NO GROWTH 3 DAYS Performed at [facility], [address], [place], [place] [address]    Report Status PENDING  Incomplete  Surgical pcr screen     Status: None   Collection Time: 03/23/22  4:04 AM   Specimen: Nasal Mucosa; Nasal Swab  Result Value Ref Range Status   MRSA, PCR NEGATIVE NEGATIVE Final   Staphylococcus aureus NEGATIVE NEGATIVE Final    Comment: (NOTE) The Xpert SA Assay (FDA approved for NASAL specimens in patients 42 years of age and older), is one component of a comprehensive surveillance program. It is not intended to diagnose infection nor to guide or monitor treatment. Performed at [facility], [address] [place], [place] [address]   Fungus Culture With Stain     Status: None (Preliminary result)   Collection Time: 03/23/22  4:55 PM   Specimen: PATH Bone biopsy; Tissue  Result Value Ref Range Status   Fungus Stain Final report  Final    Comment: (NOTE) Performed At: [facility] [address] [place], [place] [id number] Jolene Schimke MD OZ:[id number]    Fungus (Mycology) Culture PENDING  Incomplete   Fungal Source LEFT ANKLE JOINT FLIUD  Final    Comment: Performed at [facility], [address] [place], [place] [address]  Aerobic/Anaerobic Culture w Gram Stain (surgical/deep wound)     Status: None (Preliminary result)   Collection Time: 03/23/22  4:55 PM   Specimen: PATH Bone biopsy; Tissue  Result Value Ref Range Status    Specimen Description WOUND  Final   Special Requests LEFT ANKLE JOINT FLUID SPEC A  Final   Gram Stain NO WBC SEEN NO ORGANISMS SEEN   Final   Culture   Final    NO GROWTH 2 DAYS NO ANAEROBES ISOLATED; CULTURE IN PROGRESS FOR 5 DAYS Performed at [facility], [address] [place], [place] [address]    Report Status PENDING  Incomplete  Fungus Culture Result     Status: None   Collection Time: 03/23/22  4:55 PM  Result Value Ref Range Status   Result 1 Comment  Final    Comment: (NOTE) KOH/Calcofluor preparation:  no fungus observed. Performed At: [facility] [address] [place], [place] [id number] Jolene Schimke MD JO:[id number]   Fungus Culture With Stain     Status: None (Preliminary result)   Collection Time: 03/23/22  5:41 PM   Specimen: PATH Bone biopsy; Tissue  Result Value Ref Range Status   Fungus Stain Final report  Final    Comment: (NOTE) Performed At: [facility] [address] [place], [place] [id number] Jolene Schimke MD TD:[id number]    Fungus (Mycology) Culture PENDING  Incomplete   Fungal Source LEFT ANKLE JOINT FLUID SPEC B  Final    Comment: Performed at [facility], [address] [place], [place] [address]  Aerobic/Anaerobic Culture w Gram Stain (surgical/deep wound)     Status: None (Preliminary result)   Collection Time: 03/23/22  5:41 PM   Specimen: PATH Bone biopsy; Tissue  Result Value Ref Range Status   Specimen Description WOUND  Final   Special Requests LEFT ANKLE JOINT SPEC B  Final   Gram Stain   Final    FEW WBC PRESENT, PREDOMINANTLY PMN NO ORGANISMS SEEN    Culture   Final    NO GROWTH 2 DAYS NO ANAEROBES ISOLATED; CULTURE IN PROGRESS FOR 5 DAYS Performed at [facility], [address], [place], [place] [address]    Report Status PENDING  Incomplete  Fungus Culture Result     Status: None   Collection Time: 03/23/22  5:41 PM  Result Value Ref Range Status   Result 1 Comment  Final     Comment: (NOTE) KOH/Calcofluor preparation:  no fungus observed. Performed At: [facility] [address] [place], [place] [id number] Jolene Schimke MD TG:[id number]   Fungus Culture With Stain     Status: None (Preliminary result)   Collection Time: 03/23/22  5:47 PM   Specimen: PATH Bone biopsy; Tissue  Result Value Ref Range Status   Fungus Stain Final report  Final    Comment: (NOTE) Performed At: [facility] [address] [place], [place] [id number] Jolene Schimke MD HW:[id number]    Fungus (Mycology) Culture PENDING  Incomplete   Fungal Source LEFT DITAL TIBIA  Final    Comment: Performed at [facility], [address] [place], [place] [address]  Aerobic/Anaerobic Culture w Gram Stain (surgical/deep wound)     Status: None (Preliminary result)   Collection Time: 03/23/22  5:47 PM   Specimen: PATH Bone biopsy; Tissue  Result Value Ref Range Status   Specimen Description WOUND  Final   Special Requests LEFT DISTAL TIBIA  Final   Gram Stain NO WBC SEEN NO ORGANISMS SEEN   Final   Culture   Final    NO GROWTH 2 DAYS NO ANAEROBES ISOLATED; CULTURE IN PROGRESS FOR 5 DAYS Performed at [facility], [address] [place], [place] [address]    Report Status PENDING  Incomplete  Fungus Culture  Result     Status: None   Collection Time: 03/23/22  5:47 PM  Result Value Ref Range Status   Result 1 Comment  Final    Comment: (NOTE) KOH/Calcofluor preparation:  no fungus observed. Performed At: [facility] [address] [place], [place] [id number] Jolene Schimke MD PJ:[id number]   Fungus Culture With Stain     Status: None (Preliminary result)   Collection Time: 03/23/22  5:51 PM   Specimen: PATH Bone biopsy; Tissue  Result Value Ref Range Status   Fungus Stain Final report  Final    Comment: (NOTE) Performed At: [facility] [address] [place], [place] [id number] Jolene Schimke MD IO:[id number]    Fungus (Mycology)  Culture PENDING  Incomplete   Fungal Source LEFT TALUS SPEC D  Final    Comment: Performed at [facility], [address], [place], [place] [address]  Aerobic/Anaerobic Culture w Gram Stain (surgical/deep wound)     Status: None (Preliminary result)   Collection Time: 03/23/22  5:51 PM   Specimen: PATH Bone biopsy; Tissue  Result Value Ref Range Status   Specimen Description WOUND  Final   Special Requests LEFT TALUS SPEC D  Final   Gram Stain NO WBC SEEN NO ORGANISMS SEEN   Final   Culture   Final    NO GROWTH 2 DAYS NO ANAEROBES ISOLATED; CULTURE IN PROGRESS FOR 5 DAYS Performed at [facility], [address], [place], [place] [address]    Report Status PENDING  Incomplete  Fungus Culture Result     Status: None   Collection Time: 03/23/22  5:51 PM  Result Value Ref Range Status   Result 1 Comment  Final    Comment: (NOTE) KOH/Calcofluor preparation:  no fungus observed. Performed At: [facility] [address] [place], [place] [id number] Jolene Schimke MD HE:[id number]     Studies/Results: No results found.    Assessment/Plan:  INTERVAL HISTORY: Cultures remain negative from operative sites   Principal Problem:   Septic arthritis of ankle (HCC) Active Problems:   Rheumatoid arthritis (HCC)   Hyperlipidemia   Tobacco abuse   Hypertension   Microcytic anemia   Osteomyelitis (HCC)   HFrEF (heart failure with reduced ejection fraction) (HCC)    Dennis Harris is a 56 y.o. male with history of rheumatoid arthritis on methotrexate and prednisone with history of septic arthritis with Stenotrophomonas maltophilia isolated on PCR assay that did not show evidence of resistance mutations and  EGGARTELLA CATENAFORMIS isolated on operative culture in July, now with recurrent persistent septic arthritis and osteomyelitis status arthroscopic surgery and bone biopsy with cultures having been taken.  #1 Septic arthritis and osteomyelitis:  Given  lack of growth and desire to cover the organisms that have been recovered in the past we decided to go with oral augmentin and minocycline  He can leave with these meds filled via TOC (preferably meds to bedside) and get additional refills at [facility]   He already has a followup with Dr. Thedore Mins upcoming  I spent 36 minutes with the patient including than 50% of the time in face to face counseling of the patient for septic arthritis and osteomyelitis along with review of medical records in preparation for the visit and during the visit and in coordination of his care.  I will leave him on our inpatient list to followup his cultures but I will otherwise sign off.  Please call with further questions.  LOS: 2 days   Acey Lav 03/25/2022, 10:57 AM

## 2022-03-25 NOTE — Plan of Care (Signed)
  Problem: Pain Managment: Goal: General experience of comfort will improve Outcome: Progressing   Problem: Safety: Goal: Ability to remain free from injury will improve Outcome: Progressing   

## 2022-03-25 NOTE — Evaluation (Signed)
Physical Therapy Evaluation Patient Details Name: Dennis Harris MRN: [id number] DOB: 11-Oct-1965 Today's Date: 03/25/2022  History of Present Illness  56 y.o. male admitted s/p left ankle I&D and bone biopsy on 03/23/22. PMH: HLD, RA, HTN.  Clinical Impression  Pt was seen for work on mult AD's and to work on both level surfaces and stairs.  Has a good comprehension of the sequence for stairs after discussion with PT and will recommend him to go home with HHPT and order for [facility].  Has a RW at home, has family support as well.  Follow up with him as his stay permits, with post op shoe substituted by podiatrist due to pain on lateral and medial L ankle from the cam boot.  Follow acutely for progressing distances and safety, with smaller cast shoe ordered for his use at home.       Recommendations for follow up therapy are one component of a multi-disciplinary discharge planning process, led by the attending physician.  Recommendations may be updated based on patient status, additional functional criteria and insurance authorization.  Follow Up Recommendations Home health PT      Assistance Recommended at Discharge Set up Supervision/Assistance  Patient can return home with the following  A little help with walking and/or transfers;A little help with bathing/dressing/bathroom;Assistance with cooking/housework;Assist for transportation;Help with stairs or ramp for entrance    Equipment Recommendations Cane  Recommendations for Other Services       Functional Status Assessment Patient has had a recent decline in their functional status and demonstrates the ability to make significant improvements in function in a reasonable and predictable amount of time.     Precautions / Restrictions Precautions Precautions: Fall Precaution Comments: post op shoe on L foot to stand and walk Other Brace: post op shoe L foot Restrictions Weight Bearing Restrictions: No      Mobility  Bed  Mobility Overal bed mobility: Modified Independent                  Transfers Overall transfer level: Modified independent Equipment used: Rolling walker (2 wheels)               General transfer comment: with WB permitted on LLE is stable to stand    Ambulation/Gait Ambulation/Gait assistance: Min guard Gait Distance (Feet): 150 Feet Assistive device: Rolling walker (2 wheels), 1 person hand held assist, Straight cane Gait Pattern/deviations: Step-through pattern, Decreased stride length, Decreased weight shift to left, Wide base of support Gait velocity: reduced Gait velocity interpretation: <1.31 ft/sec, indicative of household ambulator Pre-gait activities: standing balance and posture ck General Gait Details: unloaded LLE with RW most efficiently and then with [facility] requires close cues for balance control  Stairs Stairs: Yes Stairs assistance: Min guard Stair Management: One rail Right, One rail Left, Step to pattern, Forwards Number of Stairs: 18 General stair comments: pt requires cues for sequence and used R then L rail for support  Wheelchair Mobility    Modified Rankin (Stroke Patients Only)       Balance Overall balance assessment: Needs assistance Sitting-balance support: Feet supported Sitting balance-Leahy Scale: Good     Standing balance support: Bilateral upper extremity supported, During functional activity Standing balance-Leahy Scale: Fair Standing balance comment: less than fair dynamically                             Pertinent Vitals/Pain Pain Assessment Pain Assessment: Faces Faces Pain Scale:  Hurts even more Pain Location: L foot Pain Descriptors / Indicators: Sharp, Shooting, Throbbing Pain Intervention(s): Limited activity within patient's tolerance, Monitored during session, Premedicated before session, Repositioned (new shoe was applied and has no increased pain to walk)    Home Living Family/patient expects to  be discharged to:: Private residence Living Arrangements: Spouse/significant other Available Help at Discharge: Family;Available PRN/intermittently Type of Home: Apartment Home Access: Stairs to enter Entrance Stairs-Rails: Doctor, general practice of Steps: 13 x2   Home Layout: One level Home Equipment: Grab bars - tub/shower;Rolling Walker (2 wheels) Additional Comments: Pt sits on side of the tub when showering    Prior Function Prior Level of Function : Independent/Modified Independent;Working/employed;Driving             Mobility Comments: unemployed now       Higher education careers adviser   Dominant Hand: Right    Extremity/Trunk Assessment   Upper Extremity Assessment Upper Extremity Assessment: Defer to OT evaluation    Lower Extremity Assessment Lower Extremity Assessment: LLE deficits/detail LLE Deficits / Details: edema with pain on med/lateral L ankle from surgery LLE: Unable to fully assess due to pain LLE Coordination: decreased gross motor    Cervical / Trunk Assessment Cervical / Trunk Assessment: Normal  Communication   Communication: No difficulties  Cognition Arousal/Alertness: Awake/alert Behavior During Therapy: WFL for tasks assessed/performed Overall Cognitive Status: Within Functional Limits for tasks assessed                                          General Comments General comments (skin integrity, edema, etc.): pt is up to walk with cast shoe in much more comfort, able to ascend stairs and maneuver on RW and with more contact can manage [facility]    Exercises     Assessment/Plan    PT Assessment Patient needs continued PT services  PT Problem List Decreased strength;Decreased range of motion;Decreased activity tolerance;Decreased balance;Decreased mobility;Decreased coordination;Decreased skin integrity;Pain       PT Treatment Interventions DME instruction;Gait training;Stair training;Functional mobility  training;Therapeutic activities;Therapeutic exercise;Balance training;Neuromuscular re-education;Patient/family education    PT Goals (Current goals can be found in the Care Plan section)  Acute Rehab PT Goals Patient Stated Goal: to go home and recover his strength PT Goal Formulation: With patient Time For Goal Achievement: 04/08/22 Potential to Achieve Goals: Good    Frequency Min 3X/week     Co-evaluation               AM-PAC PT "6 Clicks" Mobility  Outcome Measure Help needed turning from your back to your side while in a flat bed without using bedrails?: None Help needed moving from lying on your back to sitting on the side of a flat bed without using bedrails?: A Little Help needed moving to and from a bed to a chair (including a wheelchair)?: A Little Help needed standing up from a chair using your arms (e.g., wheelchair or bedside chair)?: A Little Help needed to walk in hospital room?: A Little Help needed climbing 3-5 steps with a railing? : A Little 6 Click Score: 19    End of Session Equipment Utilized During Treatment: Gait belt Activity Tolerance: Patient tolerated treatment well;Treatment limited secondary to medical complications (Comment) Patient left: in bed;with call bell/phone within reach;with bed alarm set Nurse Communication: Mobility status PT Visit Diagnosis: Unsteadiness on feet (R26.81);Pain;Muscle weakness (generalized) (M62.81);Other abnormalities of gait  and mobility (R26.89) Pain - Right/Left: Left Pain - part of body: Ankle and joints of foot    Time: 1440-1520 PT Time Calculation (min) (ACUTE ONLY): 40 min   Charges:   PT Evaluation $PT Eval Moderate Complexity: 1 Mod PT Treatments $Gait Training: 8-22 mins $Therapeutic Activity: 8-22 mins       Ramond Dial 03/25/2022, 5:16 PM  Mee Hives, PT PhD Acute Rehab Dept. Number: [place]

## 2022-03-25 NOTE — Progress Notes (Signed)
Subjective:  Patient ID: Dennis Harris, male    DOB: [birth year],  MRN: [id number]  Patient seen at bedside this morning s/p left ankle I&D and bone biopsy. Relates he is doing well and his pain is better. Denies any n/v/f/c.   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   Exertional shortness of breath    "at times" (11/14/2012)   Hypercholesteremia    Hypertension    Pericarditis ~ 2009   Pneumonia    Rheumatoid arthritis(714.0)      Past Surgical History:  Procedure Laterality Date   ANTERIOR CERVICAL DECOMP/DISCECTOMY FUSION N/A 11/14/2012   Procedure: ANTERIOR CERVICAL DECOMPRESSION/DISCECTOMY FUSION 1 LEVEL C5-6;  Surgeon: Venita Lick, MD;  Location: [facility] OR;  Service: Orthopedics;  Laterality: N/A;   APPENDECTOMY  1970's   BACK SURGERY     CARDIAC CATHETERIZATION  May 2010   normal   I & D EXTREMITY Left 02/02/2022   Procedure: IRRIGATION AND DEBRIDEMENT LEFT FOOT/ANKLE;  Surgeon: Candelaria Stagers, DPM;  Location: [facility] OR;  Service: Podiatry;  Laterality: Left;   I & D EXTREMITY Left 03/23/2022   Procedure: IRRIGATION AND DEBRIDEMENT EXTREMITY;  Surgeon: Louann Sjogren, DPM;  Location: [facility] OR;  Service: Podiatry;  Laterality: Left;   JOINT REPLACEMENT     TONSILLECTOMY  1970's?   TOTAL HIP ARTHROPLASTY Right 12/13/2016   TOTAL HIP ARTHROPLASTY Right 12/13/2016   Procedure: RIGHT TOTAL HIP ARTHROPLASTY ANTERIOR APPROACH;  Surgeon: Kathryne Hitch, MD;  Location: [facility] OR;  Service: Orthopedics;  Laterality: Right;       Latest Ref Rng & Units 03/24/2022    1:08 AM 03/23/2022    2:35 AM 03/22/2022    1:23 PM  CBC  WBC 4.0 - 10.5 K/uL 9.4  6.9  8.4   Hemoglobin 13.0 - 17.0 g/dL 9.3  9.7  78.9   Hematocrit 39.0 - 52.0 % 29.8  30.3  33.4   Platelets 150 - 400 K/uL 388  412  460        Latest Ref Rng & Units 03/24/2022    1:08 AM 03/23/2022    2:35 AM 03/22/2022    1:23 PM  BMP  Glucose 70 - 99 mg/dL 98  98  381   BUN 6  - 20 mg/dL 9  10  8    Creatinine 0.61 - 1.24 mg/dL  0.17  5.10   Sodium 135 - 145 mmol/L 134  133  135   Potassium 3.5 - 5.1 mmol/L 4.0  4.0  3.9   Chloride 98 - 111 mmol/L 103  101  103   CO2 22 - 32 mmol/L 23  21  22    Calcium 8.9 - 10.3 mg/dL 8.9  8.9  9.3      Objective:   Vitals:   03/25/22 0852 03/25/22 0921  BP: (!) 152/76 (!) 151/80  Pulse: 70 80  Resp:  18  Temp:  (!) 97.5 F (36.4 C)  SpO2:  100%    General:AA&O x 3. Normal mood and affect   Vascular: DP and PT pulses 2/4 bilateral. Brisk capillary refill to all digits. Pedal hair present   Neruological. Epicritic sensation grossly intact. No strike through noted.   Derm: Dressing clean dry and intact.   MSK: MMT 5/5 in dorsiflexion, plantar flexion, inversion and eversion. Normal joint ROM without pain or crepitus.      Assessment & Plan:  Patient was evaluated  and treated and all questions answered.  S/P left ankle I&D and bone biopsy.  Dressing to remain intact, dry and clean.  Antibiotics: Per primary. Cutlures no growth x 2.  Appreciate ID recommendations for further antibiotic treatment.  DME: CAM boot   Patient is ok for discharge from podiatry standpoint  He will be seen one week after discharge in our clinic. We will schedule for him.   Louann Sjogren, DPM  Accessible via secure chat for questions or concerns.

## 2022-03-25 NOTE — Progress Notes (Signed)
Mobility Specialist Progress Note:   03/25/22 1533  Mobility  Activity Ambulated with assistance in hallway  Level of Assistance Standby assist, set-up cues, supervision of patient - no hands on  Assistive Device Front wheel walker  Distance Ambulated (ft) 550 ft  Activity Response Tolerated well  $Mobility charge 1 Mobility   Pt received sitting EOB and agreeable. C/o cam boot being too large. Pt left sitting EOB with all needs met and call bell in reach.   Dennis Harris Mobility Specialist-Acute Rehab Secure Chat only

## 2022-03-25 NOTE — Discharge Instructions (Addendum)
Dear Dennis Harris,   Thank you so much for allowing [place] to be part of your care!  You were admitted to [facility] for an infection in your ankle that required several surgeries. You are going to be discharged on antibiotics, please make sure that you take them as instructed and finish all of your medications.    POST-HOSPITAL & CARE INSTRUCTIONS Make sure to follow-up with your podiatrist Follow-up with outpatient for a PCP to follow-up your blood pressures and to get a referral for colonoscopy Follow-up with rheumatology for your medication management  Please let PCP/Specialists know of any changes that were made.  Please see medications section of this packet for any medication changes.   DOCTOR'S APPOINTMENT & FOLLOW UP CARE INSTRUCTIONS  Future Appointments  Date Time Provider Department Center  03/29/2022 10:15 AM Vivi Barrack, DPM [facility]  03/31/2022  8:00 AM Dimple Casey, Jamesetta Orleans, MD [facility] None  04/01/2022  9:30 AM Danelle Earthly, MD [facility]  04/07/2022  8:50 AM Sharon Seller, Marzella Schlein, PA-C [facility] None    RETURN PRECAUTIONS:   Take care and be well!  Family Medicine Teaching Service  [facility]  [address] [place], [place] [address] [phone]

## 2022-03-25 NOTE — Telephone Encounter (Signed)
Contacted patient to discuss [facility] services, patient declined HH but prefer to do Outpatient PT, referral sent to [facility] at [facility].Dennis Harris

## 2022-03-25 NOTE — Progress Notes (Signed)
Discharge instructions given to pt. Pt verbalized understanding of all teaching and had no further questions. NT crystal going to pharmacy now to get patients homemeds and pt currently in room waiting for large ortho boot to be delivered. Ride has been calleed

## 2022-03-25 NOTE — Discharge Summary (Signed)
Family Medicine Teaching [facility] Discharge Summary  Patient name: Dennis Harris Medical record number: [id number] Date of birth: 05/02/1966 Age: 56 y.o. Gender: male Date of Admission: 03/22/2022  Date of Discharge: 03/25/2022 Admitting Physician: Glendale Chard, DO  Primary Care Provider: Patient, No Pcp Per Consultants: Podiatry, ID  Indication for Hospitalization: Septic arthritis of left ankl  Discharge Diagnoses/Problem List:  Present on Admission:  Hypertension  Osteomyelitis (HCC)  Septic arthritis of ankle (HCC)  Tobacco abuse  Rheumatoid arthritis (HCC)  Hyperlipidemia  HFrEF (heart failure with reduced ejection fraction) (HCC)  Microcytic anemia  Disposition: Home with [facility] PT  Discharge Condition: Stable, improved  Discharge Exam: General: Alert and oriented, in no apparent distress, pleasant and conversational Cardiovascular: RRR S1 S2 present, no rubs, murmurs, or gallops.  Respiratory: CTA bilaterally, no increased respiratory effort Abdomen: Soft, no masses, no tenderness to palpation, no hepatosplenomegaly.  Extremities: L ankle wrapped and in post-op shoe. Can wiggle toes and sensation is intact in toes. R ankle sensation and mobility intact.   Brief Hospital Course:  Mr. Seaberg is a 56 y.o. male with a PMH of RA, HTN, nonischemic cardiomyopathy, AVN of both hips, tobacco abuse who was admitted for left ankle septic arthritis and osteomyelitis confirmed by MRI. Hospital course as listed below:   Septic arthritis of ankle (HCC) On this admission, he was s/p irrigation & debridement with podiatry on 02/02/22 which isolated stenotrophomonas maltophilia and eggartella catenaformis treated with subsequent 4-week course of Bactrim/minocycline. On 03/23/22, he had another irrigation and debridgement with podiatry which yielded left ankle culture x2 and bone biopsy x2. He was then started on Unasyn and minocycline, and switched to oral minocycline and Augmentin.   He was discharged with a CAM boot and walker/cane.    HFrEF (heart failure with reduced ejection fraction) and HTN  H/o nonischemic cardiomyopathy with reduced EF 35-40% in 2021. Echo on 03/25/22 showed normal EF of 60-65%, moderate concentric LVH, and mild dilation of the left atrium. Given improved EF, did not start Jardiance as felt GDMT not necessary at this time, but can consider in the future.   Rheumatoid arthritis (HCC) - Methotrexate dose taken 8/29, taken weekly. On prednisone and folic acid.   Hyperlipidemia - ASCVD risk currently 17.4-24%. Continue atorvastatin 40mg  daily Microcytic anemia - Suspect mixed picture of anemia of chronic disease and iron deficiency. Recent iron studies show iron 32, sat ratio 11%.   Follow up: Follow up with podiatry post-op  Recommend outpatient HTN follow-up. He is on losartan, amlodipine, and carvedilol for HTN. He has been on Entresto and chlorthalidone in the past. Recommend starting [place] Pepper (his insurance will cover it) for GDMT.  Encourage smoking cessation Recommend outpatient rheumatology f/u for medication management Recommend outpatient colorectal cancer screening due to chronic microcytic anemia Recommend PT/OT follow up   Significant Procedures:  8/30 - Left Ankle I&D with Bone biopsy 9/10 - Echocardiogram  Significant Labs and Imaging:  Recent Labs  Lab 03/22/22 1323 03/23/22 0235 03/24/22 0108  WBC 8.4 6.9 9.4  HGB 10.5* 9.7* 9.3*  HCT 33.4* 30.3* 29.8*  PLT 460* 412* 388   Recent Labs  Lab 03/22/22 1323 03/23/22 0235 03/24/22 0108  NA 135 133* 134*  K 3.9 4.0 4.0  CL 103 101 103  CO2 22 21* 23  GLUCOSE 111* 98 98  BUN 8 10 9   CREATININE 0.99 1.04 1.11  CALCIUM 9.3 8.9 8.9    ECHOCARDIOGRAM COMPLETE  Result Date: 03/25/2022    ECHOCARDIOGRAM  REPORT   Patient Name:   FREDDERICK Harris Date of Exam: 03/25/2022 Medical Rec #:  [id number]           Height:       71.0 in Accession #:    [id number]          Weight:        160.0 lb Date of Birth:  May 12, 1966           BSA:          1.918 m Patient Age:    56 years            BP:           151/80 mmHg Patient Gender: M                   HR:           78 bpm. Exam Location:  Inpatient Procedure: 2D Echo, Cardiac Doppler and Color Doppler Indications:    CHF  History:        Patient has prior history of Echocardiogram examinations, most                 recent 12/17/2019. Risk Factors:Hypertension.  Sonographer:    Gaynell Face Referring Phys: Alfredo Martinez IMPRESSIONS  1. Left ventricular ejection fraction, by estimation, is 60 to 65%. The left ventricle has normal function. The left ventricle has no regional wall motion abnormalities. There is moderate concentric left ventricular hypertrophy. Left ventricular diastolic parameters were normal.  2. Right ventricular systolic function is normal. The right ventricular size is normal.  3. Left atrial size was mildly dilated.  4. The mitral valve is normal in structure. No evidence of mitral valve regurgitation. No evidence of mitral stenosis.  5. The aortic valve is tricuspid. There is mild calcification of the aortic valve. Aortic valve regurgitation is not visualized. No aortic stenosis is present.  6. The inferior vena cava is normal in size with greater than 50% respiratory variability, suggesting right atrial pressure of 3 mmHg. FINDINGS  Left Ventricle: Left ventricular ejection fraction, by estimation, is 60 to 65%. The left ventricle has normal function. The left ventricle has no regional wall motion abnormalities. The left ventricular internal cavity size was normal in size. There is  moderate concentric left ventricular hypertrophy. Left ventricular diastolic parameters were normal. Right Ventricle: The right ventricular size is normal. No increase in right ventricular wall thickness. Right ventricular systolic function is normal. Left Atrium: Left atrial size was mildly dilated. Right Atrium: Right atrial size was normal in  size. Pericardium: There is no evidence of pericardial effusion. Mitral Valve: The mitral valve is normal in structure. No evidence of mitral valve regurgitation. No evidence of mitral valve stenosis. Tricuspid Valve: The tricuspid valve is normal in structure. Tricuspid valve regurgitation is trivial. No evidence of tricuspid stenosis. Aortic Valve: The aortic valve is tricuspid. There is mild calcification of the aortic valve. Aortic valve regurgitation is not visualized. No aortic stenosis is present. Aortic valve mean gradient measures 6.0 mmHg. Aortic valve peak gradient measures 11.3 mmHg. Aortic valve area, by VTI measures 2.95 cm. Pulmonic Valve: The pulmonic valve was normal in structure. Pulmonic valve regurgitation is trivial. No evidence of pulmonic stenosis. Aorta: The aortic root is normal in size and structure. Venous: The inferior vena cava is normal in size with greater than 50% respiratory variability, suggesting right atrial pressure of 3 mmHg. IAS/Shunts: No atrial level shunt detected by color flow  Doppler.  LEFT VENTRICLE PLAX 2D LVIDd:         4.50 cm   Diastology LVIDs:         3.10 cm   LV e' medial:    9.25 cm/s LV PW:         1.50 cm   LV E/e' medial:  8.7 LV IVS:        1.40 cm   LV e' lateral:   9.46 cm/s LVOT diam:     2.50 cm   LV E/e' lateral: 8.5 LV SV:         81 LV SV Index:   42 LVOT Area:     4.91 cm  RIGHT VENTRICLE RV S prime:     15.30 cm/s TAPSE (M-mode): 2.0 cm LEFT ATRIUM             Index        RIGHT ATRIUM           Index LA diam:        3.10 cm 1.62 cm/m   RA Area:     15.80 cm LA Vol (A2C):   58.4 ml 30.45 ml/m  RA Volume:   33.50 ml  17.47 ml/m LA Vol (A4C):   57.6 ml 30.03 ml/m LA Biplane Vol: 60.2 ml 31.39 ml/m  AORTIC VALVE AV Area (Vmax):    2.65 cm AV Area (Vmean):   2.57 cm AV Area (VTI):     2.95 cm AV Vmax:           168.00 cm/s AV Vmean:          113.000 cm/s AV VTI:            0.273 m AV Peak Grad:      11.3 mmHg AV Mean Grad:      6.0 mmHg LVOT  Vmax:         90.80 cm/s LVOT Vmean:        59.100 cm/s LVOT VTI:          0.164 m LVOT/AV VTI ratio: 0.60  AORTA Ao Root diam: 3.30 cm Ao Asc diam:  3.10 cm MITRAL VALVE MV Area (PHT): 3.91 cm    SHUNTS MV Decel Time: 194 msec    Systemic VTI:  0.16 m MV E velocity: 80.20 cm/s  Systemic Diam: 2.50 cm MV A velocity: 95.00 cm/s MV E/A ratio:  0.84 Glori Bickers MD Electronically signed by Glori Bickers MD Signature Date/Time: 03/25/2022/4:38:37 PM    Final    DG Ankle Complete Left  Result Date: 03/22/2022 Please see detailed radiograph report in office note.  MR ANKLE LEFT W WO CONTRAST  Result Date: 03/21/2022 IMPRESSION: IMPRESSION Compared to 01/23/2022: 1. Interval decrease in talonavicular joint fluid including resolution of the prior rim enhancing fluid that previously extended laterally from the talonavicular joint. There is still a small amount of synovium enhancing and joint fluid extending dorsally from the talonavicular joint. Again this is suspicious for septic arthritis. There is also again subchondral marrow edema suspicious for osteomyelitis. 2. New moderate tibiotalar joint effusion with new moderate thickening and enhancement of the synovium suspicious for synovitis and septic arthritis. Tibial and talar subchondral marrow edema suspicious for osteomyelitis. 3. Mild posterior tibial tenosynovitis is mildly improved from prior. Mild tendinosis appears similar to prior. Electronically Signed   By: Yvonne Kendall M.D.   On: 03/21/2022 12:44    Results/Tests Pending at Time of Discharge: None  Discharge Medications:  Allergies  as of 03/25/2022       Reactions   Bee Venom Anaphylaxis   Shellfish Allergy Anaphylaxis, Hives        Medication List     STOP taking these medications    ibuprofen 200 MG tablet Commonly known as: ADVIL   oxyCODONE-acetaminophen 7.5-325 MG tablet Commonly known as: PERCOCET       TAKE these medications    acetaminophen 325 MG  tablet Commonly known as: TYLENOL Take 2 tablets (650 mg total) by mouth every 6 (six) hours as needed for mild pain (or Fever >/= 101).   amLODipine 10 MG tablet Commonly known as: NORVASC Take 1 tablet (10 mg total) by mouth daily.   amoxicillin-clavulanate 875-125 MG tablet Commonly known as: AUGMENTIN Take 1 tablet by mouth 2 (two) times daily.   atorvastatin 40 MG tablet Commonly known as: LIPITOR Take 1 tablet (40 mg total) by mouth daily.   carvedilol 3.125 MG tablet Commonly known as: COREG Take 1 tablet (3.125 mg total) by mouth 2 (two) times daily.   folic acid 1 MG tablet Commonly known as: FOLVITE Take 1 tablet (1 mg total) by mouth daily.   gabapentin 300 MG capsule Commonly known as: NEURONTIN Take 2 capsules (600 mg total) by mouth at bedtime. What changed: how much to take   losartan 25 MG tablet Commonly known as: COZAAR Take 1 tablet (25 mg total) by mouth daily. Start taking on: March 26, 2022   methotrexate 2.5 MG tablet Commonly known as: RHEUMATREX Take 20 mg by mouth once a week. Take 8 tablets (20 mg) weekly. Caution:Chemotherapy. Protect from light.   minocycline 100 MG capsule Commonly known as: MINOCIN Take 2 capsules (200 mg total) by mouth 2 (two) times daily.   nitroGLYCERIN 0.4 MG SL tablet Commonly known as: NITROSTAT Place 1 tablet (0.4 mg total) under the tongue every 5 (five) minutes as needed for chest pain.   Oxycodone HCl 10 MG Tabs Take 1 tablet (10 mg total) by mouth every 6 (six) hours as needed for up to 7 days for breakthrough pain or severe pain (After giving tramadol if still having pain).               Durable Medical Equipment  (From admission, onward)           Start     Ordered   03/25/22 1525  For home use only DME Cane  Once       Comments: Single point cane   03/25/22 1524            Discharge Instructions: Please refer to Patient Instructions section of EMR for full details.  Patient was  counseled important signs and symptoms that should prompt return to medical care, changes in medications, dietary instructions, activity restrictions, and follow up appointments.   Follow-Up Appointments:  Follow-up Information     [facility] Follow up.   Why: April 07, 2022 at 0850 am Contact information: [address] [place] [address] [place] [address] [phone]        [facility] Follow up.   Specialty: Rehabilitation Why: Call to schedule an appointment by Tuesday 9/5 Contact information: [address] [id number] mc [address] [place] [address] [phone]                Evelena Leyden, DO 03/25/2022, 6:18 PM PGY-3, [facility] Family Medicine

## 2022-03-25 NOTE — TOC Initial Note (Addendum)
Transition of Care [facility]) - Initial/Assessment Note    Patient Details  Name: Dennis Harris MRN: [id number] Date of Birth: [birth year]  Transition of Care [facility]) CM/SW Contact:    Kingsley Plan, RN Phone Number: 03/25/2022, 4:06 PM  Clinical Narrative:                 Spoke to patient at bedside. Confirmed face sheet information.   Ordered cane with Lacresia with [employer] . Gilmer Mor will come to room prior to discharge. Patient received walker through insurance in 7/23, insurance will not cover cane cost $25.00 Patient declined cane   Patient does not have a PCP   Patient does have [employer].   Discussed [facility], patient would like to establish care. Appointment scheduled and placed on AVS   Expected Discharge Plan: Home/Self Care Barriers to Discharge: Continued Medical Work up   Patient Goals and CMS Choice Patient states their goals for this hospitalization and ongoing recovery are:: to return to home      Expected Discharge Plan and Services Expected Discharge Plan: Home/Self Care   Discharge Planning Services: CM Consult   Living arrangements for the past 2 months: Single Family Home                 DME Arranged: Gilmer Mor DME Agency: [employer] Date DME Agency Contacted: 03/25/22 Time DME Agency Contacted: [phone] Representative spoke with at DME Agency: Beola Cord HH Arranged: NA          Prior Living Arrangements/Services Living arrangements for the past 2 months: Single Family Home Lives with:: Spouse Patient language and need for interpreter reviewed:: Yes Do you feel safe going back to the place where you live?: Yes      Need for Family Participation in Patient Care: Yes (Comment) Care giver support system in place?: Yes (comment)   Criminal Activity/Legal Involvement Pertinent to Current Situation/Hospitalization: No - Comment as needed  Activities of Daily Living Home Assistive Devices/Equipment: Walker (specify  type) ADL Screening (condition at time of admission) Patient's cognitive ability adequate to safely complete daily activities?: Yes Is the patient deaf or have difficulty hearing?: No Does the patient have difficulty seeing, even when wearing glasses/contacts?: No Does the patient have difficulty concentrating, remembering, or making decisions?: No Patient able to express need for assistance with ADLs?: Yes Does the patient have difficulty dressing or bathing?: No Independently performs ADLs?: Yes (appropriate for developmental age) Does the patient have difficulty walking or climbing stairs?: Yes Weakness of Legs: Left Weakness of Arms/Hands: None  Permission Sought/Granted   Permission granted to share information with : No              Emotional Assessment Appearance:: Appears stated age Attitude/Demeanor/Rapport: Engaged Affect (typically observed): Accepting Orientation: : Oriented to Self, Oriented to Place, Oriented to  Time, Oriented to Situation Alcohol / Substance Use: Not Applicable Psych Involvement: No (comment)  Admission diagnosis:  Septic arthritis (HCC) [M00.9] Osteomyelitis (HCC) [M86.9] Patient Active Problem List   Diagnosis Date Noted   Septic arthritis (HCC) 03/22/2022   Osteomyelitis (HCC) 03/22/2022   HFrEF (heart failure with reduced ejection fraction) (HCC) 03/22/2022   AKI (acute kidney injury) (HCC) 02/04/2022   Hypoalbuminemia 02/04/2022   Microcytic anemia 02/02/2022   Thrombocytosis 02/02/2022   Hyponatremia 02/02/2022   Septic arthritis of ankle (HCC) 01/31/2022   Hypertension    Pain in left ankle and joints of left foot 12/28/2021   High risk medication use 11/22/2021  Rheumatoid nodule of left elbow (HCC) 11/22/2021   Hyperlipidemia 11/29/2019   Tobacco abuse 11/29/2019   Avascular necrosis of hip, right (HCC) 12/13/2016   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   Avascular necrosis of bones of both hips (HCC)  12/07/2016   Pain of left hip joint 12/07/2016   Pain of right hip joint 12/07/2016   SIRS (systemic inflammatory response syndrome) (HCC) 08/07/2014   Cardiomyopathy- EF 40-45% presumed secondary to acute illness 08/07/2014   CAP (community acquired pneumonia)    Right upper quadrant pain    Acute pericarditis 08/04/2014   Acute hyponatremia 08/04/2014   Protein-calorie malnutrition, severe (HCC) 08/04/2014   Sepsis (HCC) 08/03/2014   Legionella pneumonia (HCC) 08/03/2014   Rheumatoid arthritis (HCC) 08/03/2014   Chest pain 08/03/2014   Elevated troponin- not fel to be secondary to MI 08/03/2014   Nausea vomiting and diarrhea 08/03/2014   Cervical spondylosis without myelopathy 11/06/2012   PCP:  Patient, No Pcp Per Pharmacy:   [facility] [address], [address] [place] [address] Phone: [phone] Fax: [phone]  [facility] [address] [place] [address] Phone: [phone] Fax: [phone]     Social Determinants of Health (SDOH) Interventions    Readmission Risk Interventions     No data to display

## 2022-03-25 NOTE — Progress Notes (Addendum)
Daily Progress Note Intern Pager: [phone]  Patient name: Dennis Harris Medical record number: [id number] Date of birth: 06-16-1966 Age: 56 y.o. Gender: male  Primary Care Provider: Patient, No Pcp Per Consultants: Podiatry, Infectious disease  Code Status: FULL  Pt Overview and Major Events to Date:  8/29 - admitted  8/30 - surgical debridement   Assessment and Plan: Dennis Harris is a 56 y.o. male with a PMH of RA, HTN, nonischemic cardiomyopathy, HFrEF, AVN of both hips, tobacco abuse who was admitted for septic arthritis and osteomyelitis of the left ankle. * Septic arthritis of ankle (HCC) Left ankle septic arthritis and osteomyelitis confirmed by MRI, s/p irrigation & debridement with podiatry on 02/02/22 and 4-week course of Bactrim/minocycline. Prior debridement isolated stenotrophomonas maltophilia and eggartella catenaformis. S/p irrigation and debridement with podiatry on 03/23/22 which yielded left ankle culture x2, left distal tibial bone biopsy and left talar bone biopsy. -ID consulted, appreciate care and recommendations --Started on minocycline and Unasyn per ID  --Awaiting bone bx, can narrow abx as able - Pain control (pain score currently 4/10): Tylenol 1000mg  q6h, Tramadol 50mg  q6h, Gabapentin 600mg  qhs, Ibuprofen 600mg  q6h, Oxycodone 10 mg  - PT/OT eval and treat  HFrEF (heart failure with reduced ejection fraction) (HCC) H/o nonischemic cardiomyopathy with reduced EF 35-40% in 2021. Lost to cardiology follow up, hx of Entresto rx. No evidence of acute CHF currently. - Recommend outpatient cardiology f/u and repeat Echo - Started losartan 25 mg -- Continue carvedilol 3.125 mg   -- Continue atorvastatin 40 mg   Rheumatoid arthritis (HCC) Chronic.  Managed by rheumatology.  Methotrexate was stopped previously due to kidney function concerns during antibiotics.  Methotrexate dose taken 8/29, taken weekly. Also on prednisone.  - Follow-up outpatient with  rheumatology for medication management - Continue folic acid  Hypertension Home meds previously included: chlorthalidone and losartan.  He was stopped on his medications and started on 10 mg amlodipine last admission due to AKI and ongoing Bactrim use.  He was severely hypertensive on admission likely due to pain, currently normotensive.  -- Started losartan  - Continue amlodipine  - Continue Coreg  - Monitor pain, BP and renal function, consider restarting chlorthalidone  Microcytic anemia Chronic. Most recent Hb 9.3. Suspect mixed picture of anemia of chronic disease and iron deficiency. Recent iron studies show iron 32, sat ratio 11%. - Hold off on iron supplementation during current infection - Recommend outpatient colorectal cancer screening  Tobacco abuse Patient currently smokes 3 to 4 cigarettes daily. - Nicotine patch offered, patient declined - Cessation counseling  Hyperlipidemia Was lost to cardiology f/u and was not taking statin. ASCVD risk 17.4-24% based on most recent lipid panel - Continue atorvastatin 40mg  daily   FEN/GI: Regular PPx: SCDs Dispo:Home today. Barriers include podiatry/PT recs.   Subjective:  Dennis Harris was seen at bedside resting comfortably. His pain has been well controlled. He denies chest pain, SOB, headache, abdominal pain. He is passing gas but no BM yet. No urinary difficulties. We discussed that he can d/c to home and his daughter can drive him to his f/u appointments.   Objective: Temp:  [97.5 F (36.4 C)-98.1 F (36.7 C)] 97.5 F (36.4 C) (09/01 0921) Pulse Rate:  [70-83] 80 (09/01 0921) Resp:  [17-19] 18 (09/01 0921) BP: (119-154)/(59-80) 151/80 (09/01 0921) SpO2:  [94 %-100 %] 100 % (09/01 0921) Physical Exam: General: Alert and oriented, in no apparent distress, pleasant and conversational Cardiovascular: RRR S1 S2 present,  no rubs, murmurs, or gallops.  Respiratory: CTA bilaterally, no increased respiratory effort Abdomen:  Soft, no masses, no tenderness to palpation, no hepatosplenomegaly.  Extremities: L ankle wrapped post-op. Can wiggle toes and sensation is intact in toes. R ankle sensation and mobility intact.   Laboratory: Most recent CBC Lab Results  Component Value Date   WBC 9.4 03/24/2022   HGB 9.3 (L) 03/24/2022   HCT 29.8 (L) 03/24/2022   MCV 79.3 (L) 03/24/2022   PLT 388 03/24/2022   Most recent BMP    Latest Ref Rng & Units 03/24/2022    1:08 AM  BMP  Glucose 70 - 99 mg/dL 98   BUN 6 - 20 mg/dL 9   Creatinine 6.29 - 5.28 mg/dL 4.13   Sodium 244 - 010 mmol/L 134   Potassium 3.5 - 5.1 mmol/L 4.0   Chloride 98 - 111 mmol/L 103   CO2 22 - 32 mmol/L 23   Calcium 8.9 - 10.3 mg/dL 8.9    Imaging/Diagnostic Tests: None new    Dennis Harris, Medical Student 03/25/2022, 3:01 PM [facility] FPTS Intern pager: [phone], text pages welcome Secure chat group [facility] Teaching Service      FPTS Upper-Level Resident Addendum   I have independently interviewed and examined the patient. I have discussed the above with the original author and agree with their documentation. My edits for correction/addition/clarification are in within the document. Please see also any attending notes.   Evelena Leyden, DO  PGY-3, [facility] 03/25/2022 3:16 PM  FPTS Service pager: [phone] (text pages welcome through [facility])

## 2022-03-25 NOTE — Progress Notes (Signed)
Orthopedic Tech Progress Note Patient Details:  JAMS TRICKETT 12/31/1965 [id number]  Ortho Devices Type of Ortho Device: Postop shoe/boot Ortho Device/Splint Location: LLE Ortho Device/Splint Interventions: Ordered   Post Interventions Patient Tolerated: Fair Instructions Provided: Adjustment of device, Care of device Shoe dropped off in room, pt currently having an ECHO session. Darleen Crocker 03/25/2022, 2:22 PM

## 2022-03-27 LAB — CULTURE, BLOOD (ROUTINE X 2)
Culture: NO GROWTH
Culture: NO GROWTH

## 2022-03-28 LAB — AEROBIC/ANAEROBIC CULTURE W GRAM STAIN (SURGICAL/DEEP WOUND)
Culture: NO GROWTH
Culture: NO GROWTH
Culture: NO GROWTH
Culture: NO GROWTH
Gram Stain: NONE SEEN
Gram Stain: NONE SEEN
Gram Stain: NONE SEEN

## 2022-03-29 ENCOUNTER — Ambulatory Visit (INDEPENDENT_AMBULATORY_CARE_PROVIDER_SITE_OTHER): Payer: Managed Care, Other (non HMO) | Admitting: Podiatry

## 2022-03-29 ENCOUNTER — Ambulatory Visit: Payer: Managed Care, Other (non HMO)

## 2022-03-29 DIAGNOSIS — Z9889 Other specified postprocedural states: Secondary | ICD-10-CM

## 2022-03-29 DIAGNOSIS — M00872 Arthritis due to other bacteria, left ankle and foot: Secondary | ICD-10-CM

## 2022-03-29 NOTE — Progress Notes (Signed)
Subjective: Chief Complaint  Patient presents with   Routine Post Op    Rm 13 PoV # 1 DOS 03/23/22. PT states no NV, fever or chills. Pt states he is in a lot of pain.   56 year old male presents with above concerns.  He is status post I&D, debridement with Dr. Ralene Cork for septic ankle.  Presents today for further evaluation and for follow-up after being discharged from the hospital.  Still having swelling and pain to his ankle.  Denies any fevers or chills.  Per the discharge: On this admission, he was s/p irrigation & debridement with podiatry on 02/02/22 which isolated stenotrophomonas maltophilia and eggartella catenaformis treated with subsequent 4-week course of Bactrim/minocycline. On 03/23/22, he had another irrigation and debridgement with podiatry which yielded left ankle culture x2 and bone biopsy x2. He was then started on Unasyn and minocycline, and switched to oral minocycline and Augmentin.  He was discharged with a CAM boot and walker/cane.    Specimen Description WOUND   Special Requests LEFT TALUS SPEC D   Gram Stain NO WBC SEEN  NO ORGANISMS SEEN   Culture No growth aerobically or anaerobically.  Performed at [facility]    Specimen Description WOUND   Special Requests LEFT DISTAL TIBIA   Gram Stain NO WBC SEEN  NO ORGANISMS SEEN   Culture No growth aerobically or anaerobically.  Performed at [facility], [address], [place], [place] [address]   Report Status 03/28/2022 FINAL    Specimen Description WOUND   Special Requests LEFT ANKLE JOINT SPEC B   Gram Stain FEW WBC PRESENT, PREDOMINANTLY PMN  NO ORGANISMS SEEN   Culture No growth aerobically or anaerobically.  Performed at [facility], [address], [place], [place] [address]   Report Status 03/28/2022 FINAL    Objective: Specimen Description WOUND   Special Requests LEFT ANKLE JOINT FLUID SPEC A   Gram Stain NO WBC SEEN  NO ORGANISMS SEEN   Culture No growth aerobically or  anaerobically.  Performed at [facility], [address], [place], [place] [address]   Report Status 03/28/2022 FINAL   AAO x3, NAD DP/PT pulses palpable bilaterally, CRT less than 3 seconds Incisions well coapted with sutures intact.  There is still chronic appearing edema to the ankle but there is no erythema or warmth.  No drainage or pus.  There is no fluctuation or crepitation.  No malodor.  No pain with calf compression, swelling, warmth, erythema  Assessment: 56 year old male with septic ankle  Plan: -All treatment options discussed with the patient including all alternatives, risks, complications.  -X-rays obtained reviewed.  No definitive cortical changes suggestive of osteoarthritis time. -Cam boot was dispensed. No surgical shoe today.  Ankle pain we will do more immobilization to help. -Compression, elevation. -I discussed with the patient possible return to the OR for arthroscopic washout debridement if needed however for now we will continue to monitor clinically. -Continue antibiotics per infectious disease. -Patient encouraged to call the office with any questions, concerns, change in symptoms.   Vivi Barrack DPM

## 2022-03-30 NOTE — Progress Notes (Deleted)
Office Visit Note  Patient: Dennis Harris             Date of Birth: 1966-01-08           MRN: [id number]             PCP: Patient, No Pcp Per Referring: No ref. provider found Visit Date: 03/31/2022   Subjective:  No chief complaint on file.   History of Present Illness: Dennis Harris is a 56 y.o. male here for follow up for history of RA    Previous HPI 12/28/2021  Dennis Harris is a 56 y.o. male here for follow up for  history of RA after starting methotrexate 15 mg PO weekly. He feels his joint pain and swelling are improved in most areas but the left ankle is not getting any better. He took prednisone initially but even while on this his ankle did not decrease swelling noticeably. 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   Previous HPI 11/22/2021 Dennis Harris is a 56 y.o. male here for joint pains he has history of RA treated with methotrexate and recent ED visits due to flare ups treated with prednisone.  He was diagnosed sometime around 2007 to 2009 with rheumatoid arthritis due to joint inflammation in multiple areas.  He was on oral methotrexate for years for treatment which had been effective.  Also treated episodically with steroids for active inflammation which was generally beneficial and no major side effects.  Most recent other rheumatology provider with Dr. Nickola Major but has not seen her for more than a year and due to loss of insurance when he lost his previous employment.  As result he has been off any maintenance treatment throughout last year.  He also has a history of degenerative arthritis and AVN and had anterior cervical spine fusion and right hip replacement. He developed skin rashes on his chest years ago saw dermatology a decade or more ago was not thought to represent any significant ongoing process.  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   Labs reviewed 05/2018 ANA neg RF 168 CCP >250     No Rheumatology ROS completed.   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   Hyperlipidemia 11/29/2019   Tobacco abuse 11/29/2019   Avascular necrosis of hip, right (HCC) 12/13/2016   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   Avascular necrosis of bones of both hips (HCC) 12/07/2016   Pain of left hip joint 12/07/2016   Pain of right hip joint 12/07/2016   SIRS (systemic inflammatory response syndrome) (HCC) 08/07/2014   Cardiomyopathy- EF 40-45% presumed secondary to acute illness 08/07/2014   CAP (community acquired pneumonia)    Right upper quadrant pain    Acute pericarditis 08/04/2014   Acute hyponatremia 08/04/2014   Protein-calorie malnutrition,  severe (HCC) 08/04/2014   Sepsis (HCC) 08/03/2014   Legionella pneumonia (HCC) 08/03/2014   Rheumatoid arthritis (HCC) 08/03/2014   Chest pain 08/03/2014   Elevated troponin- not fel to be secondary to MI 08/03/2014   Nausea vomiting and diarrhea 08/03/2014   Cervical spondylosis without myelopathy 11/06/2012    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   Exertional shortness of breath    "at times" (11/14/2012)   Hypercholesteremia    Hypertension    Pericarditis ~ 2009   Pneumonia    Rheumatoid arthritis(714.0)     Family History   Problem Relation Age of Onset   Cancer Mother    Hypertension Mother    Stroke Mother    Diabetes Mother    Cirrhosis Father    Lupus Sister    Lupus Sister    Breast cancer Sister    Past Surgical History:  Procedure Laterality Date   ANTERIOR CERVICAL DECOMP/DISCECTOMY FUSION N/A 11/14/2012   Procedure: ANTERIOR CERVICAL DECOMPRESSION/DISCECTOMY FUSION 1 LEVEL C5-6;  Surgeon: Venita Lick, MD;  Location: [facility] OR;  Service: Orthopedics;  Laterality: N/A;   APPENDECTOMY  1970's   BACK SURGERY     CARDIAC CATHETERIZATION  May 2010   normal   I & D EXTREMITY Left 02/02/2022   Procedure: IRRIGATION AND DEBRIDEMENT LEFT FOOT/ANKLE;  Surgeon: Candelaria Stagers, DPM;  Location: [facility] OR;  Service: Podiatry;  Laterality: Left;   I & D EXTREMITY Left 03/23/2022   Procedure: IRRIGATION AND DEBRIDEMENT EXTREMITY;  Surgeon: Louann Sjogren, DPM;  Location: [facility] OR;  Service: Podiatry;  Laterality: Left;   JOINT REPLACEMENT     TONSILLECTOMY  1970's?   TOTAL HIP ARTHROPLASTY Right 12/13/2016   TOTAL HIP ARTHROPLASTY Right 12/13/2016   Procedure: RIGHT TOTAL HIP ARTHROPLASTY ANTERIOR APPROACH;  Surgeon: Kathryne Hitch, MD;  Location: [facility] OR;  Service: Orthopedics;  Laterality: Right;   Social History   Social History Narrative   Not on file   Immunization History  Administered Date(s) Administered   Influenza,inj,Quad PF,6+ Mos 08/08/2014   PFIZER(Purple Top)SARS-COV-2 Vaccination 10/11/2019, 11/01/2019   PPD Test 06/18/2018   Pneumococcal Polysaccharide-23 08/08/2014   Tdap 11/15/2016     Objective: Vital Signs: There were no vitals taken for this visit.   Physical Exam   Musculoskeletal Exam: ***  CDAI Exam: CDAI Score: -- Patient Global: --; Provider Global: -- Swollen: --; Tender: -- Joint Exam 03/31/2022   No joint exam has been documented for this visit   There is currently no information documented on the homunculus. Go to the Rheumatology activity and complete the  homunculus joint exam.  Investigation: No additional findings.  Imaging: ECHOCARDIOGRAM COMPLETE  Result Date: 03/25/2022    ECHOCARDIOGRAM REPORT   Patient Name:   Dennis Harris Date of Exam: 03/25/2022 Medical Rec #:  [id number]           Height:       71.0 in Accession #:    [id number]          Weight:       160.0 lb Date of Birth:  1966-02-19           BSA:          1.918 m Patient Age:    56 years            BP:           151/80 mmHg Patient Gender:  M                   HR:           78 bpm. Exam Location:  Inpatient Procedure: 2D Echo, Cardiac Doppler and Color Doppler Indications:    CHF  History:        Patient has prior history of Echocardiogram examinations, most                 recent 12/17/2019. Risk Factors:Hypertension.  Sonographer:    Gaynell Face Referring Phys: Alfredo Martinez IMPRESSIONS  1. Left ventricular ejection fraction, by estimation, is 60 to 65%. The left ventricle has normal function. The left ventricle has no regional wall motion abnormalities. There is moderate concentric left ventricular hypertrophy. Left ventricular diastolic parameters were normal.  2. Right ventricular systolic function is normal. The right ventricular size is normal.  3. Left atrial size was mildly dilated.  4. The mitral valve is normal in structure. No evidence of mitral valve regurgitation. No evidence of mitral stenosis.  5. The aortic valve is tricuspid. There is mild calcification of the aortic valve. Aortic valve regurgitation is not visualized. No aortic stenosis is present.  6. The inferior vena cava is normal in size with greater than 50% respiratory variability, suggesting right atrial pressure of 3 mmHg. FINDINGS  Left Ventricle: Left ventricular ejection fraction, by estimation, is 60 to 65%. The left ventricle has normal function. The left ventricle has no regional wall motion abnormalities. The left ventricular internal cavity size was normal in size. There is  moderate concentric left  ventricular hypertrophy. Left ventricular diastolic parameters were normal. Right Ventricle: The right ventricular size is normal. No increase in right ventricular wall thickness. Right ventricular systolic function is normal. Left Atrium: Left atrial size was mildly dilated. Right Atrium: Right atrial size was normal in size. Pericardium: There is no evidence of pericardial effusion. Mitral Valve: The mitral valve is normal in structure. No evidence of mitral valve regurgitation. No evidence of mitral valve stenosis. Tricuspid Valve: The tricuspid valve is normal in structure. Tricuspid valve regurgitation is trivial. No evidence of tricuspid stenosis. Aortic Valve: The aortic valve is tricuspid. There is mild calcification of the aortic valve. Aortic valve regurgitation is not visualized. No aortic stenosis is present. Aortic valve mean gradient measures 6.0 mmHg. Aortic valve peak gradient measures 11.3 mmHg. Aortic valve area, by VTI measures 2.95 cm. Pulmonic Valve: The pulmonic valve was normal in structure. Pulmonic valve regurgitation is trivial. No evidence of pulmonic stenosis. Aorta: The aortic root is normal in size and structure. Venous: The inferior vena cava is normal in size with greater than 50% respiratory variability, suggesting right atrial pressure of 3 mmHg. IAS/Shunts: No atrial level shunt detected by color flow Doppler.  LEFT VENTRICLE PLAX 2D LVIDd:         4.50 cm   Diastology LVIDs:         3.10 cm   LV e' medial:    9.25 cm/s LV PW:         1.50 cm   LV E/e' medial:  8.7 LV IVS:        1.40 cm   LV e' lateral:   9.46 cm/s LVOT diam:     2.50 cm   LV E/e' lateral: 8.5 LV SV:         81 LV SV Index:   42 LVOT Area:     4.91 cm  RIGHT VENTRICLE RV  S prime:     15.30 cm/s TAPSE (M-mode): 2.0 cm LEFT ATRIUM             Index        RIGHT ATRIUM           Index LA diam:        3.10 cm 1.62 cm/m   RA Area:     15.80 cm LA Vol (A2C):   58.4 ml 30.45 ml/m  RA Volume:   33.50 ml  17.47 ml/m  LA Vol (A4C):   57.6 ml 30.03 ml/m LA Biplane Vol: 60.2 ml 31.39 ml/m  AORTIC VALVE AV Area (Vmax):    2.65 cm AV Area (Vmean):   2.57 cm AV Area (VTI):     2.95 cm AV Vmax:           168.00 cm/s AV Vmean:          113.000 cm/s AV VTI:            0.273 m AV Peak Grad:      11.3 mmHg AV Mean Grad:      6.0 mmHg LVOT Vmax:         90.80 cm/s LVOT Vmean:        59.100 cm/s LVOT VTI:          0.164 m LVOT/AV VTI ratio: 0.60  AORTA Ao Root diam: 3.30 cm Ao Asc diam:  3.10 cm MITRAL VALVE MV Area (PHT): 3.91 cm    SHUNTS MV Decel Time: 194 msec    Systemic VTI:  0.16 m MV E velocity: 80.20 cm/s  Systemic Diam: 2.50 cm MV A velocity: 95.00 cm/s MV E/A ratio:  0.84 Arvilla Meres MD Electronically signed by Arvilla Meres MD Signature Date/Time: 03/25/2022/4:38:37 PM    Final    DG Ankle Complete Left  Result Date: 03/22/2022 Please see detailed radiograph report in office note.  MR ANKLE LEFT W WO CONTRAST  Result Date: 03/21/2022 CLINICAL DATA:  Septic arthritis suspected. Left ankle pain and swelling for 4-5 months. Status post left ankle surgery 02/02/2022. EXAM: MRI OF THE LEFT ANKLE WITHOUT AND WITH CONTRAST TECHNIQUE: Multiplanar, multisequence MR imaging of the ankle was performed before and after the administration of intravenous contrast. CONTRAST:  51mL MULTIHANCE GADOBENATE DIMEGLUMINE 529 MG/ML IV SOLN COMPARISON:  Left ankle radiographs 03/08/2022 and 01/21/2022; MRI left ankle 01/23/2022 FINDINGS: TENDONS Peroneal: Minimal linear signal at the posterior aspect of the peroneus brevis tendon at the level of the superior aspect of the calcaneus (axial series 6, image 19) is similar to prior may represent a tiny partial-thickness tear. The peroneus longus tendon is intact. Posteromedial: Mild fluid within the posterior tibial tendon sheath is mildly decreased from prior. Mild posterior tibial intermediate T2 signal tendinosis is similar to prior. The flexor digitorum longus and flexor hallucis  longus tendons are intact. Anterior: The tibialis anterior, extensor hallucis longus, and extensor digitorum longus tendons are intact. Achilles: Intact. There are again small ossicle seen at the posterior aspect of the distal Achilles tendon insertion, chronic enthesopathic change. Plantar Fascia: Small plantar calcaneal heel spur. No plantar fasciitis. LIGAMENTS Lateral: There is again attenuation of the fibular attachment of the inferior portion of the anterior talofibular ligament. The anterior and posterior tibiofibular, posterior talofibular, and calcaneofibular ligaments appear intact. Medial: The tibiotalar deep deltoid and tibial spring ligaments are intact. CARTILAGE Ankle Joint: Mild thinning of the anterior tibial plafond cartilage. There is a new moderate tibiotalar joint effusion. There is  moderate thickening and enhancement of the synovium of this joint suspicious for joint fluid inflammatory change. Subtalar Joints/Sinus Tarsi: Interval decrease in size of now mild likely joint fluid extending from the dorsal aspect of the talonavicular joint. The prior rim enhancing fluid that extended laterally from the talonavicular joint is no longer visualized. There is still moderate subchondral marrow edema on both sides of the middle subtalar joint. Subchondral marrow edema within the posteromedial talus at the posterior subtalar joint. Bones: There is again mild-to-moderate subchondral marrow edema on both sides of the talonavicular joint. Other: There is increased now moderate to high-grade medial, lateral, and anterior ankle soft tissue edema and swelling. IMPRESSION: IMPRESSION Compared to 01/23/2022: 1. Interval decrease in talonavicular joint fluid including resolution of the prior rim enhancing fluid that previously extended laterally from the talonavicular joint. There is still a small amount of synovium enhancing and joint fluid extending dorsally from the talonavicular joint. Again this is  suspicious for septic arthritis. There is also again subchondral marrow edema suspicious for osteomyelitis. 2. New moderate tibiotalar joint effusion with new moderate thickening and enhancement of the synovium suspicious for synovitis and septic arthritis. Tibial and talar subchondral marrow edema suspicious for osteomyelitis. 3. Mild posterior tibial tenosynovitis is mildly improved from prior. Mild tendinosis appears similar to prior. Electronically Signed   By: Neita Garnet M.D.   On: 03/21/2022 12:44   VAS [place] LOWER EXTREMITY VENOUS (DVT) (7a-7p)  Result Date: 03/04/2022  Lower Venous DVT Study Patient Name:  NASHAWN DEALMEIDA  Date of Exam:   03/03/2022 Medical Rec #: [id number]            Accession #:    [id number] Date of Birth: 04/17/1966            Patient Gender: M Patient Age:   74 years Exam Location:  [facility] Procedure:      VAS [place] LOWER EXTREMITY VENOUS (DVT) Referring Phys: Delice Bison --------------------------------------------------------------------------------  Indications: Pain, and Swelling.  Risk Factors: Surgery. Comparison Study: No prior studies. Performing Technologist: Chanda Busing RVT  Examination Guidelines: A complete evaluation includes B-mode imaging, spectral Doppler, color Doppler, and power Doppler as needed of all accessible portions of each vessel. Bilateral testing is considered an integral part of a complete examination. Limited examinations for reoccurring indications may be performed as noted. The reflux portion of the exam is performed with the patient in reverse Trendelenburg.  +-----+---------------+---------+-----------+----------+--------------+ RIGHTCompressibilityPhasicitySpontaneityPropertiesThrombus Aging +-----+---------------+---------+-----------+----------+--------------+ CFV  Full           Yes      Yes                                 +-----+---------------+---------+-----------+----------+--------------+    +---------+---------------+---------+-----------+----------+--------------+ LEFT     CompressibilityPhasicitySpontaneityPropertiesThrombus Aging +---------+---------------+---------+-----------+----------+--------------+ CFV      Full           Yes      Yes                                 +---------+---------------+---------+-----------+----------+--------------+ SFJ      Full                                                        +---------+---------------+---------+-----------+----------+--------------+  FV Prox  Full                                                        +---------+---------------+---------+-----------+----------+--------------+ FV Mid   Full                                                        +---------+---------------+---------+-----------+----------+--------------+ FV DistalFull                                                        +---------+---------------+---------+-----------+----------+--------------+ PFV      Full                                                        +---------+---------------+---------+-----------+----------+--------------+ POP      Full           Yes      Yes                                 +---------+---------------+---------+-----------+----------+--------------+ PTV      Full                                                        +---------+---------------+---------+-----------+----------+--------------+ PERO     Full                                                        +---------+---------------+---------+-----------+----------+--------------+    Summary: RIGHT: - No evidence of common femoral vein obstruction.  LEFT: - There is no evidence of deep vein thrombosis in the lower extremity.  - No cystic structure found in the popliteal fossa.  *See table(s) above for measurements and observations. Electronically signed by Gerarda Fraction on 03/04/2022 at 5:43:09 PM.    Final     Recent  Labs: Lab Results  Component Value Date   WBC 9.4 03/24/2022   HGB 9.3 (L) 03/24/2022   PLT 388 03/24/2022   NA 134 (L) 03/24/2022   K 4.0 03/24/2022   CL 103 03/24/2022   CO2 23 03/24/2022   GLUCOSE 98 03/24/2022   BUN 9 03/24/2022   CREATININE 1.11 03/24/2022   BILITOT 0.2 03/07/2022   ALKPHOS 72 02/05/2022   AST 13 03/07/2022   ALT 10 03/07/2022   PROT 8.1 03/07/2022   ALBUMIN 2.3 (L) 02/05/2022   CALCIUM 8.9 03/24/2022   GFRAA >60 11/18/2019    Speciality Comments: No specialty comments available.  Procedures:  No procedures performed Allergies: Bee venom and Shellfish allergy   Assessment / Plan:     Visit Diagnoses: No diagnosis found.  ***  Orders: No orders of the defined types were placed in this encounter.  No orders of the defined types were placed in this encounter.    Follow-Up Instructions: No follow-ups on file.   Jairo Ben, RT  Note - This record has been created using Animal nutritionist.  Chart creation errors have been sought, but may not always  have been located. Such creation errors do not reflect on  the standard of medical care.

## 2022-03-31 ENCOUNTER — Ambulatory Visit: Payer: Managed Care, Other (non HMO) | Admitting: Internal Medicine

## 2022-04-01 ENCOUNTER — Ambulatory Visit: Payer: Managed Care, Other (non HMO) | Admitting: Internal Medicine

## 2022-04-06 NOTE — Progress Notes (Deleted)
Patient ID: Dennis Harris, male   DOB: 1966/01/16, 56 y.o.   MRN: [id number]  After hospitalization 8/29-9/01 Discharge Diagnoses/Problem List:  Present on Admission:  Hypertension  Osteomyelitis (HCC)  Septic arthritis of ankle (HCC)  Tobacco abuse  Rheumatoid arthritis (HCC)  Hyperlipidemia  HFrEF (heart failure with reduced ejection fraction) (HCC)  Microcytic anemia   Brief Hospital Course:  [name] is a 56 y.o. male with a PMH of RA, HTN, nonischemic cardiomyopathy, AVN of both hips, tobacco abuse who was admitted for left ankle septic arthritis and osteomyelitis confirmed by MRI. Hospital course as listed below:    Septic arthritis of ankle (HCC) On this admission, he was s/p irrigation & debridement with podiatry on 02/02/22 which isolated stenotrophomonas maltophilia and eggartella catenaformis treated with subsequent 4-week course of Bactrim/minocycline. On 03/23/22, he had another irrigation and debridgement with podiatry which yielded left ankle culture x2 and bone biopsy x2. He was then started on Unasyn and minocycline, and switched to oral minocycline and Augmentin.  He was discharged with a CAM boot and walker/cane.    HFrEF (heart failure with reduced ejection fraction) and HTN  H/o nonischemic cardiomyopathy with reduced EF 35-40% in 2021. Echo on 03/25/22 showed normal EF of 60-65%, moderate concentric LVH, and mild dilation of the left atrium. Given improved EF, did not start Jardiance as felt GDMT not necessary at this time, but can consider in the future.    Rheumatoid arthritis (HCC) - Methotrexate dose taken 8/29, taken weekly. On prednisone and folic acid.   Hyperlipidemia - ASCVD risk currently 17.4-24%. Continue atorvastatin 40mg  daily Microcytic anemia - Suspect mixed picture of anemia of chronic disease and iron deficiency. Recent iron studies show iron 32, sat ratio 11%.    Follow up: Follow up with podiatry post-op  Recommend outpatient HTN follow-up.  He is on losartan, amlodipine, and carvedilol for HTN. He has been on Entresto and chlorthalidone in the past. Recommend starting (his insurance will cover it) for GDMT.  Encourage smoking cessation Recommend outpatient rheumatology f/u for medication management Recommend outpatient colorectal cancer screening due to chronic microcytic anemia Recommend PT/OT follow up

## 2022-04-07 ENCOUNTER — Inpatient Hospital Stay: Payer: Managed Care, Other (non HMO) | Admitting: Physician Assistant

## 2022-04-11 ENCOUNTER — Other Ambulatory Visit: Payer: Self-pay

## 2022-04-11 ENCOUNTER — Encounter: Payer: Self-pay | Admitting: Podiatry

## 2022-04-11 ENCOUNTER — Ambulatory Visit (INDEPENDENT_AMBULATORY_CARE_PROVIDER_SITE_OTHER): Payer: Managed Care, Other (non HMO) | Admitting: Podiatry

## 2022-04-11 DIAGNOSIS — M00872 Arthritis due to other bacteria, left ankle and foot: Secondary | ICD-10-CM

## 2022-04-11 DIAGNOSIS — Z09 Encounter for follow-up examination after completed treatment for conditions other than malignant neoplasm: Secondary | ICD-10-CM

## 2022-04-11 DIAGNOSIS — M25572 Pain in left ankle and joints of left foot: Secondary | ICD-10-CM

## 2022-04-11 MED ORDER — OXYCODONE-ACETAMINOPHEN 7.5-325 MG PO TABS
1.0000 | ORAL_TABLET | ORAL | 0 refills | Status: AC | PRN
  Filled 2022-04-11: qty 30, 5d supply, fill #0

## 2022-04-11 NOTE — Progress Notes (Signed)
Subjective: No chief complaint on file. 56 year old male presents with above concerns.  He is status post I&D, debridement for septic ankle.  Presents today for further evaluation and for follow-up.  Still having swelling and pain to his ankle mostly triggered by activities. .  Denies any fevers or chills.  Per the discharge: On this admission, he was s/p irrigation & debridement with podiatry on 02/02/22 which isolated stenotrophomonas maltophilia and eggartella catenaformis treated with subsequent 4-week course of Bactrim/minocycline. On 03/23/22, he had another irrigation and debridgement with podiatry which yielded left ankle culture x2 and bone biopsy x2. He was then started on Unasyn and minocycline, and switched to oral minocycline and Augmentin.  He was discharged with a CAM boot and walker/cane.    Specimen Description WOUND   Special Requests LEFT TALUS SPEC D   Gram Stain NO WBC SEEN  NO ORGANISMS SEEN   Culture No growth aerobically or anaerobically.  Performed at [facility]    Specimen Description WOUND   Special Requests LEFT DISTAL TIBIA   Gram Stain NO WBC SEEN  NO ORGANISMS SEEN   Culture No growth aerobically or anaerobically.  Performed at [facility], [place] [address] [place], [place] [address]   Report Status 03/28/2022 FINAL    Specimen Description WOUND   Special Requests LEFT ANKLE JOINT SPEC B   Gram Stain FEW WBC PRESENT, PREDOMINANTLY PMN  NO ORGANISMS SEEN   Culture No growth aerobically or anaerobically.  Performed at [facility], [place] [address] [place], [place] [address]   Report Status 03/28/2022 FINAL    Objective: Specimen Description WOUND   Special Requests LEFT ANKLE JOINT FLUID SPEC A   Gram Stain NO WBC SEEN  NO ORGANISMS SEEN   Culture No growth aerobically or anaerobically.  Performed at [facility], [place] [address], [place],  [address]   Report Status 03/28/2022 FINAL   AAO x3, NAD DP/PT pulses  palpable bilaterally, CRT less than 3 seconds Incisions well coapted with sutures intact.  There is still chronic appearing edema to the ankle but there is no erythema or warmth.  No drainage or pus.  There is no fluctuation or crepitation.  No malodor.  No pain with calf compression, swelling, warmth, erythema  Assessment: 56 year old male with septic ankle  Plan: -All treatment options discussed with the patient including all alternatives, risks, complications.  -X-rays obtained reviewed.  No definitive cortical changes suggestive of osteoarthritis time. -Continue in CAM boot  -Suture removed today without incident.  -Compression, elevation. -Refill of pain medicine provided. Oxycodone 7.5 mg -325mg   -Discussed again with the patient possible return to the OR for arthroscopic washout debridement if needed however for now we will continue to monitor clinically. -Continue antibiotics per infectious disease. -Patient encouraged to call the office with any questions, concerns, change in symptoms.  Follow-up in 3 weeks for post-op   Lorenda Peck DPM

## 2022-04-12 NOTE — Progress Notes (Deleted)
Office Visit Note  Patient: Dennis Harris             Date of Birth: 1966/07/10           MRN: [id number]             PCP: Patient, No Pcp Per Referring: No ref. provider found Visit Date: 04/26/2022   Subjective:  No chief complaint on file.   History of Present Illness: Dennis Harris is a 56 y.o. male here for follow up for  history of RA after starting methotrexate 15 mg PO weekly   Previous HPI 12/28/2021 Dennis Harris is a 56 y.o. male here for follow up for  history of RA after starting methotrexate 15 mg PO weekly. He feels his joint pain and swelling are improved in most areas but the left ankle is not getting any better. He took prednisone initially but even while on this his ankle did not decrease swelling noticeably. 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   Previous HPI 11/22/2021 Dennis Harris is a 56 y.o. male here for joint pains he has history of RA treated with methotrexate and recent ED visits due to flare ups treated with prednisone.  He was diagnosed sometime around 2007 to 2009 with rheumatoid arthritis due to joint inflammation in multiple areas.  He was on oral methotrexate for years for treatment which had been effective.  Also treated episodically with steroids for active inflammation which was generally beneficial and no major side effects.  Most recent other rheumatology provider with Dr. Nickola Major but has not seen her for more than a year and due to loss of insurance when he lost his previous employment.  As result he has been off any maintenance treatment throughout last year.  He also has a history of degenerative arthritis and AVN and had anterior cervical spine fusion and right hip replacement. He developed skin rashes on his chest years ago saw dermatology a decade or more ago was not thought to represent any significant ongoing process.  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   Labs reviewed 05/2018 ANA neg RF 168 CCP >250  No Rheumatology ROS completed.   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   Hyperlipidemia 11/29/2019   Tobacco abuse 11/29/2019   Avascular necrosis of hip, right (HCC) 12/13/2016   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   Avascular necrosis of bones of both hips (HCC) 12/07/2016   Pain of left hip joint 12/07/2016   Pain of right hip joint 12/07/2016   SIRS (systemic inflammatory response syndrome) (HCC) 08/07/2014   Cardiomyopathy- EF 40-45% presumed secondary to acute illness 08/07/2014   CAP (community acquired pneumonia)    Right upper quadrant pain    Acute pericarditis 08/04/2014   Acute hyponatremia 08/04/2014  Protein-calorie malnutrition, severe (HCC) 08/04/2014   Sepsis (HCC) 08/03/2014   Legionella pneumonia (HCC) 08/03/2014   Rheumatoid arthritis (HCC) 08/03/2014   Chest pain 08/03/2014   Elevated troponin- not fel to be secondary to MI 08/03/2014   Nausea vomiting and diarrhea 08/03/2014   Cervical spondylosis without myelopathy 11/06/2012    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   Exertional shortness of breath    "at times" (11/14/2012)   Hypercholesteremia    Hypertension    Pericarditis ~ 2009   Pneumonia    Rheumatoid  arthritis(714.0)     Family History  Problem Relation Age of Onset   Cancer Mother    Hypertension Mother    Stroke Mother    Diabetes Mother    Cirrhosis Father    Lupus Sister    Lupus Sister    Breast cancer Sister    Past Surgical History:  Procedure Laterality Date   ANTERIOR CERVICAL DECOMP/DISCECTOMY FUSION N/A 11/14/2012   Procedure: ANTERIOR CERVICAL DECOMPRESSION/DISCECTOMY FUSION 1 LEVEL C5-6;  Surgeon: Venita Lick, MD;  Location: [facility];  Service: Orthopedics;  Laterality: N/A;   APPENDECTOMY  1970's   BACK SURGERY     CARDIAC CATHETERIZATION  May 2010   normal   I & D EXTREMITY Left 02/02/2022   Procedure: IRRIGATION AND DEBRIDEMENT LEFT FOOT/ANKLE;  Surgeon: Candelaria Stagers, DPM;  Location: [facility];  Service: Podiatry;  Laterality: Left;   I & D EXTREMITY Left 03/23/2022   Procedure: IRRIGATION AND DEBRIDEMENT EXTREMITY;  Surgeon: Louann Sjogren, DPM;  Location: [facility];  Service: Podiatry;  Laterality: Left;   JOINT REPLACEMENT     TONSILLECTOMY  1970's?   TOTAL HIP ARTHROPLASTY Right 12/13/2016   TOTAL HIP ARTHROPLASTY Right 12/13/2016   Procedure: RIGHT TOTAL HIP ARTHROPLASTY ANTERIOR APPROACH;  Surgeon: Kathryne Hitch, MD;  Location: [facility];  Service: Orthopedics;  Laterality: Right;   Social History   Social History Narrative   Not on file   Immunization History  Administered Date(s) Administered   Influenza,inj,Quad PF,6+ Mos 08/08/2014   PFIZER(Purple Top)SARS-COV-2 Vaccination 10/11/2019, 11/01/2019   PPD Test 06/18/2018   Pneumococcal Polysaccharide-23 08/08/2014   Tdap 11/15/2016     Objective: Vital Signs: There were no vitals taken for this visit.   Physical Exam   Musculoskeletal Exam: ***  CDAI Exam: CDAI Score: -- Patient Global: --; Provider Global: -- Swollen: --; Tender: -- Joint Exam 04/26/2022   No joint exam has been documented for this visit   There is currently no information documented on the homunculus. Go to the  Rheumatology activity and complete the homunculus joint exam.  Investigation: No additional findings.  Imaging: DG Ankle 2 Views Left  Result Date: 03/30/2022 Please see detailed radiograph report in office note.  ECHOCARDIOGRAM COMPLETE  Result Date: 03/25/2022    ECHOCARDIOGRAM REPORT   Patient Name:   Dennis Harris Date of Exam: 03/25/2022 Medical Rec #:  [id number]           Height:       71.0 in Accession #:    [id number]          Weight:       160.0 lb Date of Birth:  Apr 10, 1966           BSA:          1.918 m Patient Age:    17 years  BP:           151/80 mmHg Patient Gender: M                   HR:           78 bpm. Exam Location:  Inpatient Procedure: 2D Echo, Cardiac Doppler and Color Doppler Indications:    CHF  History:        Patient has prior history of Echocardiogram examinations, most                 recent 12/17/2019. Risk Factors:Hypertension.  Sonographer:    Memory Argue Referring Phys: Erskine Emery IMPRESSIONS  1. Left ventricular ejection fraction, by estimation, is 60 to 65%. The left ventricle has normal function. The left ventricle has no regional wall motion abnormalities. There is moderate concentric left ventricular hypertrophy. Left ventricular diastolic parameters were normal.  2. Right ventricular systolic function is normal. The right ventricular size is normal.  3. Left atrial size was mildly dilated.  4. The mitral valve is normal in structure. No evidence of mitral valve regurgitation. No evidence of mitral stenosis.  5. The aortic valve is tricuspid. There is mild calcification of the aortic valve. Aortic valve regurgitation is not visualized. No aortic stenosis is present.  6. The inferior vena cava is normal in size with greater than 50% respiratory variability, suggesting right atrial pressure of 3 mmHg. FINDINGS  Left Ventricle: Left ventricular ejection fraction, by estimation, is 60 to 65%. The left ventricle has normal function. The left ventricle  has no regional wall motion abnormalities. The left ventricular internal cavity size was normal in size. There is  moderate concentric left ventricular hypertrophy. Left ventricular diastolic parameters were normal. Right Ventricle: The right ventricular size is normal. No increase in right ventricular wall thickness. Right ventricular systolic function is normal. Left Atrium: Left atrial size was mildly dilated. Right Atrium: Right atrial size was normal in size. Pericardium: There is no evidence of pericardial effusion. Mitral Valve: The mitral valve is normal in structure. No evidence of mitral valve regurgitation. No evidence of mitral valve stenosis. Tricuspid Valve: The tricuspid valve is normal in structure. Tricuspid valve regurgitation is trivial. No evidence of tricuspid stenosis. Aortic Valve: The aortic valve is tricuspid. There is mild calcification of the aortic valve. Aortic valve regurgitation is not visualized. No aortic stenosis is present. Aortic valve mean gradient measures 6.0 mmHg. Aortic valve peak gradient measures 11.3 mmHg. Aortic valve area, by VTI measures 2.95 cm. Pulmonic Valve: The pulmonic valve was normal in structure. Pulmonic valve regurgitation is trivial. No evidence of pulmonic stenosis. Aorta: The aortic root is normal in size and structure. Venous: The inferior vena cava is normal in size with greater than 50% respiratory variability, suggesting right atrial pressure of 3 mmHg. IAS/Shunts: No atrial level shunt detected by color flow Doppler.  LEFT VENTRICLE PLAX 2D LVIDd:         4.50 cm   Diastology LVIDs:         3.10 cm   LV e' medial:    9.25 cm/s LV PW:         1.50 cm   LV E/e' medial:  8.7 LV IVS:        1.40 cm   LV e' lateral:   9.46 cm/s LVOT diam:     2.50 cm   LV E/e' lateral: 8.5 LV SV:         81 LV SV Index:  42 LVOT Area:     4.91 cm  RIGHT VENTRICLE RV S prime:     15.30 cm/s TAPSE (M-mode): 2.0 cm LEFT ATRIUM             Index        RIGHT ATRIUM            Index LA diam:        3.10 cm 1.62 cm/m   RA Area:     15.80 cm LA Vol (A2C):   58.4 ml 30.45 ml/m  RA Volume:   33.50 ml  17.47 ml/m LA Vol (A4C):   57.6 ml 30.03 ml/m LA Biplane Vol: 60.2 ml 31.39 ml/m  AORTIC VALVE AV Area (Vmax):    2.65 cm AV Area (Vmean):   2.57 cm AV Area (VTI):     2.95 cm AV Vmax:           168.00 cm/s AV Vmean:          113.000 cm/s AV VTI:            0.273 m AV Peak Grad:      11.3 mmHg AV Mean Grad:      6.0 mmHg LVOT Vmax:         90.80 cm/s LVOT Vmean:        59.100 cm/s LVOT VTI:          0.164 m LVOT/AV VTI ratio: 0.60  AORTA Ao Root diam: 3.30 cm Ao Asc diam:  3.10 cm MITRAL VALVE MV Area (PHT): 3.91 cm    SHUNTS MV Decel Time: 194 msec    Systemic VTI:  0.16 m MV E velocity: 80.20 cm/s  Systemic Diam: 2.50 cm MV A velocity: 95.00 cm/s MV E/A ratio:  0.84 Arvilla Meres MD Electronically signed by Arvilla Meres MD Signature Date/Time: 03/25/2022/4:38:37 PM    Final    MR ANKLE LEFT W WO CONTRAST  Result Date: 03/21/2022 CLINICAL DATA:  Septic arthritis suspected. Left ankle pain and swelling for 4-5 months. Status post left ankle surgery 02/02/2022. EXAM: MRI OF THE LEFT ANKLE WITHOUT AND WITH CONTRAST TECHNIQUE: Multiplanar, multisequence MR imaging of the ankle was performed before and after the administration of intravenous contrast. CONTRAST:  15mL MULTIHANCE GADOBENATE DIMEGLUMINE 529 MG/ML IV SOLN COMPARISON:  Left ankle radiographs 03/08/2022 and 01/21/2022; MRI left ankle 01/23/2022 FINDINGS: TENDONS Peroneal: Minimal linear signal at the posterior aspect of the peroneus brevis tendon at the level of the superior aspect of the calcaneus (axial series 6, image 19) is similar to prior may represent a tiny partial-thickness tear. The peroneus longus tendon is intact. Posteromedial: Mild fluid within the posterior tibial tendon sheath is mildly decreased from prior. Mild posterior tibial intermediate T2 signal tendinosis is similar to prior. The flexor  digitorum longus and flexor hallucis longus tendons are intact. Anterior: The tibialis anterior, extensor hallucis longus, and extensor digitorum longus tendons are intact. Achilles: Intact. There are again small ossicle seen at the posterior aspect of the distal Achilles tendon insertion, chronic enthesopathic change. Plantar Fascia: Small plantar calcaneal heel spur. No plantar fasciitis. LIGAMENTS Lateral: There is again attenuation of the fibular attachment of the inferior portion of the anterior talofibular ligament. The anterior and posterior tibiofibular, posterior talofibular, and calcaneofibular ligaments appear intact. Medial: The tibiotalar deep deltoid and tibial spring ligaments are intact. CARTILAGE Ankle Joint: Mild thinning of the anterior tibial plafond cartilage. There is a new moderate tibiotalar joint effusion. There is moderate thickening and enhancement  of the synovium of this joint suspicious for joint fluid inflammatory change. Subtalar Joints/Sinus Tarsi: Interval decrease in size of now mild likely joint fluid extending from the dorsal aspect of the talonavicular joint. The prior rim enhancing fluid that extended laterally from the talonavicular joint is no longer visualized. There is still moderate subchondral marrow edema on both sides of the middle subtalar joint. Subchondral marrow edema within the posteromedial talus at the posterior subtalar joint. Bones: There is again mild-to-moderate subchondral marrow edema on both sides of the talonavicular joint. Other: There is increased now moderate to high-grade medial, lateral, and anterior ankle soft tissue edema and swelling. IMPRESSION: IMPRESSION Compared to 01/23/2022: 1. Interval decrease in talonavicular joint fluid including resolution of the prior rim enhancing fluid that previously extended laterally from the talonavicular joint. There is still a small amount of synovium enhancing and joint fluid extending dorsally from the  talonavicular joint. Again this is suspicious for septic arthritis. There is also again subchondral marrow edema suspicious for osteomyelitis. 2. New moderate tibiotalar joint effusion with new moderate thickening and enhancement of the synovium suspicious for synovitis and septic arthritis. Tibial and talar subchondral marrow edema suspicious for osteomyelitis. 3. Mild posterior tibial tenosynovitis is mildly improved from prior. Mild tendinosis appears similar to prior. Electronically Signed   By: Neita Garnet M.D.   On: 03/21/2022 12:44    Recent Labs: Lab Results  Component Value Date   WBC 9.4 03/24/2022   HGB 9.3 (L) 03/24/2022   PLT 388 03/24/2022   NA 134 (L) 03/24/2022   K 4.0 03/24/2022   CL 103 03/24/2022   CO2 23 03/24/2022   GLUCOSE 98 03/24/2022   BUN 9 03/24/2022   CREATININE 1.11 03/24/2022   BILITOT 0.2 03/07/2022   ALKPHOS 72 02/05/2022   AST 13 03/07/2022   ALT 10 03/07/2022   PROT 8.1 03/07/2022   ALBUMIN 2.3 (L) 02/05/2022   CALCIUM 8.9 03/24/2022   GFRAA >60 11/18/2019    Speciality Comments: No specialty comments available.  Procedures:  No procedures performed Allergies: Bee venom and Shellfish allergy   Assessment / Plan:     Visit Diagnoses: No diagnosis found.  ***  Orders: No orders of the defined types were placed in this encounter.  No orders of the defined types were placed in this encounter.    Follow-Up Instructions: No follow-ups on file.   Jairo Ben, RT  Note - This record has been created using Animal nutritionist.  Chart creation errors have been sought, but may not always  have been located. Such creation errors do not reflect on  the standard of medical care.

## 2022-04-13 ENCOUNTER — Ambulatory Visit: Payer: Managed Care, Other (non HMO) | Admitting: Physical Therapy

## 2022-04-18 ENCOUNTER — Other Ambulatory Visit: Payer: Self-pay

## 2022-04-18 ENCOUNTER — Telehealth: Payer: Self-pay

## 2022-04-18 ENCOUNTER — Ambulatory Visit: Payer: Self-pay | Admitting: Internal Medicine

## 2022-04-18 NOTE — Telephone Encounter (Signed)
Patient called office regarding medical release of information for Long term disability. States that his insurance was canceled due to long term disability not receiving medical records. Informed patient that release was sent to medical records, but I would fax last office note per his request.  Patient will call office back to schedule follow up once his insurance is active. Advised he follow up with [facility] today.  Leatrice Jewels, RMA

## 2022-04-20 ENCOUNTER — Other Ambulatory Visit: Payer: Self-pay

## 2022-04-20 LAB — FUNGUS CULTURE WITH STAIN

## 2022-04-20 LAB — FUNGUS CULTURE RESULT

## 2022-04-20 LAB — FUNGAL ORGANISM REFLEX

## 2022-04-21 ENCOUNTER — Other Ambulatory Visit: Payer: Self-pay

## 2022-04-26 ENCOUNTER — Other Ambulatory Visit: Payer: Self-pay

## 2022-04-26 ENCOUNTER — Ambulatory Visit: Payer: Managed Care, Other (non HMO) | Admitting: Internal Medicine

## 2022-04-26 DIAGNOSIS — M069 Rheumatoid arthritis, unspecified: Secondary | ICD-10-CM

## 2022-04-26 DIAGNOSIS — Z79899 Other long term (current) drug therapy: Secondary | ICD-10-CM

## 2022-04-26 DIAGNOSIS — M25572 Pain in left ankle and joints of left foot: Secondary | ICD-10-CM

## 2022-04-26 DIAGNOSIS — M06322 Rheumatoid nodule, left elbow: Secondary | ICD-10-CM

## 2022-04-27 ENCOUNTER — Ambulatory Visit: Payer: Managed Care, Other (non HMO) | Admitting: Physical Therapy

## 2022-05-04 ENCOUNTER — Inpatient Hospital Stay: Payer: Managed Care, Other (non HMO) | Admitting: Physician Assistant

## 2022-05-05 NOTE — Progress Notes (Deleted)
Office Visit Note  Patient: Dennis Harris             Date of Birth: Dec 13, 1965           MRN: [id number]             PCP: Patient, No Pcp Per Referring: No ref. provider found Visit Date: 05/16/2022   Subjective:  No chief complaint on file.   History of Present Illness: Dennis Harris is a 56 y.o. male here for follow up or  history of RA    Previous HPI 12/28/2021 Dennis Harris is a 56 y.o. male here for follow up for  history of RA after starting methotrexate 15 mg PO weekly. He feels his joint pain and swelling are improved in most areas but the left ankle is not getting any better. He took prednisone initially but even while on this his ankle did not decrease swelling noticeably. 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   Previous HPI 11/22/2021 Dennis Harris is a 56 y.o. male here for joint pains he has history of RA treated with methotrexate and recent ED visits due to flare ups treated with prednisone.  He was diagnosed sometime around 2007 to 2009 with rheumatoid arthritis due to joint inflammation in multiple areas.  He was on oral methotrexate for years for treatment which had been effective.  Also treated episodically with steroids for active inflammation which was generally beneficial and no major side effects.  Most recent other rheumatology provider with Dr. Nickola Major but has not seen her for more than a year and due to loss of insurance when he lost his previous employment.  As result he has been off any maintenance treatment throughout last year.  He also has a history of degenerative arthritis and AVN and had anterior cervical spine fusion and right hip replacement. He developed skin rashes on his chest years ago saw dermatology a decade or more ago was not thought to represent any significant ongoing process.  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   Labs reviewed 05/2018 ANA neg RF 168 CCP >250   No Rheumatology ROS completed.   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   Hyperlipidemia 11/29/2019   Tobacco abuse 11/29/2019   Avascular necrosis of hip, right (HCC) 12/13/2016   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   Avascular necrosis of bones of both hips (HCC) 12/07/2016   Pain of left hip joint 12/07/2016   Pain of right hip joint 12/07/2016   SIRS (systemic inflammatory response syndrome) (HCC) 08/07/2014   Cardiomyopathy- EF 40-45% presumed secondary to acute illness 08/07/2014   CAP (community acquired pneumonia)    Right upper quadrant pain    Acute pericarditis 08/04/2014   Acute hyponatremia 08/04/2014   Protein-calorie malnutrition, severe (HCC)  08/04/2014   Sepsis (HCC) 08/03/2014   Legionella pneumonia (HCC) 08/03/2014   Rheumatoid arthritis (HCC) 08/03/2014   Chest pain 08/03/2014   Elevated troponin- not fel to be secondary to MI 08/03/2014   Nausea vomiting and diarrhea 08/03/2014   Cervical spondylosis without myelopathy 11/06/2012    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   Exertional shortness of breath    "at times" (11/14/2012)   Hypercholesteremia    Hypertension    Pericarditis ~ 2009   Pneumonia    Rheumatoid arthritis(714.0)     Family History   Problem Relation Age of Onset   Cancer Mother    Hypertension Mother    Stroke Mother    Diabetes Mother    Cirrhosis Father    Lupus Sister    Lupus Sister    Breast cancer Sister    Past Surgical History:  Procedure Laterality Date   ANTERIOR CERVICAL DECOMP/DISCECTOMY FUSION N/A 11/14/2012   Procedure: ANTERIOR CERVICAL DECOMPRESSION/DISCECTOMY FUSION 1 LEVEL C5-6;  Surgeon: Venita Lick, MD;  Location: [facility];  Service: Orthopedics;  Laterality: N/A;   APPENDECTOMY  1970's   BACK SURGERY     CARDIAC CATHETERIZATION  May 2010   normal   I & D EXTREMITY Left 02/02/2022   Procedure: IRRIGATION AND DEBRIDEMENT LEFT FOOT/ANKLE;  Surgeon: Candelaria Stagers, DPM;  Location: [facility];  Service: Podiatry;  Laterality: Left;   I & D EXTREMITY Left 03/23/2022   Procedure: IRRIGATION AND DEBRIDEMENT EXTREMITY;  Surgeon: Louann Sjogren, DPM;  Location: [facility];  Service: Podiatry;  Laterality: Left;   JOINT REPLACEMENT     TONSILLECTOMY  1970's?   TOTAL HIP ARTHROPLASTY Right 12/13/2016   TOTAL HIP ARTHROPLASTY Right 12/13/2016   Procedure: RIGHT TOTAL HIP ARTHROPLASTY ANTERIOR APPROACH;  Surgeon: Kathryne Hitch, MD;  Location: [facility];  Service: Orthopedics;  Laterality: Right;   Social History   Social History Narrative   Not on file   Immunization History  Administered Date(s) Administered   Influenza,inj,Quad PF,6+ Mos 08/08/2014   PFIZER(Purple Top)SARS-COV-2 Vaccination 10/11/2019, 11/01/2019   PPD Test 06/18/2018   Pneumococcal Polysaccharide-23 08/08/2014   Tdap 11/15/2016     Objective: Vital Signs: There were no vitals taken for this visit.   Physical Exam   Musculoskeletal Exam: ***  CDAI Exam: CDAI Score: -- Patient Global: --; Provider Global: -- Swollen: --; Tender: -- Joint Exam 05/16/2022   No joint exam has been documented for this visit   There is currently no information documented on the homunculus. Go to the Rheumatology activity and complete the  homunculus joint exam.  Investigation: No additional findings.  Imaging: No results found.  Recent Labs: Lab Results  Component Value Date   WBC 9.4 03/24/2022   HGB 9.3 (L) 03/24/2022   PLT 388 03/24/2022   NA 134 (L) 03/24/2022   K 4.0 03/24/2022   CL 103 03/24/2022   CO2 23 03/24/2022   GLUCOSE 98 03/24/2022   BUN 9 03/24/2022   CREATININE 1.11 03/24/2022   BILITOT 0.2 03/07/2022   ALKPHOS 72 02/05/2022   AST 13 03/07/2022   ALT 10 03/07/2022   PROT 8.1 03/07/2022   ALBUMIN 2.3 (L) 02/05/2022   CALCIUM 8.9 03/24/2022   GFRAA >60 11/18/2019    Speciality Comments: No specialty comments available.  Procedures:  No procedures performed Allergies: Bee venom and Shellfish allergy   Assessment / Plan:  Visit Diagnoses: No diagnosis found.  ***  Orders: No orders of the defined types were placed in this encounter.  No orders of the defined types were placed in this encounter.    Follow-Up Instructions: No follow-ups on file.   Bertram Savin, RT  Note - This record has been created using Editor, commissioning.  Chart creation errors have been sought, but may not always  have been located. Such creation errors do not reflect on  the standard of medical care.

## 2022-05-10 ENCOUNTER — Ambulatory Visit: Payer: Managed Care, Other (non HMO) | Admitting: Podiatry

## 2022-05-11 ENCOUNTER — Encounter (INDEPENDENT_AMBULATORY_CARE_PROVIDER_SITE_OTHER): Payer: Self-pay | Admitting: Primary Care

## 2022-05-11 ENCOUNTER — Other Ambulatory Visit: Payer: Self-pay

## 2022-05-11 ENCOUNTER — Ambulatory Visit (INDEPENDENT_AMBULATORY_CARE_PROVIDER_SITE_OTHER): Payer: Self-pay | Admitting: Primary Care

## 2022-05-11 VITALS — BP 179/112 | HR 91 | Resp 16 | Ht 70.5 in | Wt 148.0 lb

## 2022-05-11 DIAGNOSIS — M869 Osteomyelitis, unspecified: Secondary | ICD-10-CM

## 2022-05-11 DIAGNOSIS — Z7689 Persons encountering health services in other specified circumstances: Secondary | ICD-10-CM

## 2022-05-11 DIAGNOSIS — I10 Essential (primary) hypertension: Secondary | ICD-10-CM

## 2022-05-11 DIAGNOSIS — Z09 Encounter for follow-up examination after completed treatment for conditions other than malignant neoplasm: Secondary | ICD-10-CM

## 2022-05-11 DIAGNOSIS — Z760 Encounter for issue of repeat prescription: Secondary | ICD-10-CM

## 2022-05-11 DIAGNOSIS — Z1211 Encounter for screening for malignant neoplasm of colon: Secondary | ICD-10-CM

## 2022-05-11 MED ORDER — CARVEDILOL 3.125 MG PO TABS
3.1250 mg | ORAL_TABLET | Freq: Two times a day (BID) | ORAL | 0 refills | Status: DC
Start: 2022-05-11 — End: 2023-07-11
  Filled 2022-05-11 – 2022-06-07 (×2): qty 60, 30d supply, fill #0

## 2022-05-11 MED ORDER — AMLODIPINE BESYLATE 10 MG PO TABS
10.0000 mg | ORAL_TABLET | Freq: Every day | ORAL | 0 refills | Status: DC
  Filled 2022-05-11 – 2022-06-07 (×2): qty 30, 30d supply, fill #0

## 2022-05-11 MED ORDER — EMPAGLIFLOZIN 10 MG PO TABS
10.0000 mg | ORAL_TABLET | Freq: Every day | ORAL | 0 refills | Status: DC
  Filled 2022-05-11 – 2022-06-07 (×3): qty 30, 30d supply, fill #0

## 2022-05-11 MED ORDER — LOSARTAN POTASSIUM 25 MG PO TABS
25.0000 mg | ORAL_TABLET | Freq: Every day | ORAL | 0 refills | Status: DC
Start: 2022-05-11 — End: 2023-07-11
  Filled 2022-05-11 – 2022-06-07 (×2): qty 30, 30d supply, fill #0

## 2022-05-11 NOTE — Progress Notes (Signed)
[facility]   Subjective:  [name] is a 56 y.o. male presents for hospital follow up . Patient was instructed by his podiatrist to come to the hospital.  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 Today his Bp is elevated he has not been consistent with BP medication with HfrEF-restarted medication added Jardiance and referred to cardiology Dr. Gwenlyn Found . Today he is concern about insurance after 2 surgeries and still taking co pay out insurance no longer effective. Advised to call insurance company. Patient has No headache, No chest pain, No abdominal pain - No Nausea, No new weakness tingling or numbness, No Cough - shortness of breath. Complains of pain left ankle wearing boot- defer pain management to ID/podiatry. 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[place])    right   Exertional shortness of breath    "at times" (11/14/2012)   Hypercholesteremia    Hypertension    Pericarditis ~ 2009   Pneumonia    Rheumatoid arthritis(714.0)      Allergies  Allergen Reactions   Bee Venom Anaphylaxis   Shellfish Allergy Anaphylaxis and Hives      Current Outpatient Medications on File Prior to Visit  Medication Sig Dispense Refill   acetaminophen (TYLENOL) 325 MG tablet Take 2 tablets (650 mg total) by mouth every 6 (six) hours as needed for mild pain (or Fever >/= 101). 20 tablet 0   amLODipine (NORVASC) 10 MG tablet Take 1 tablet (10 mg total) by mouth daily. 30 tablet 0   amoxicillin-clavulanate (AUGMENTIN) 875-125 MG tablet Take 1 tablet by mouth 2 (two) times daily. 60 tablet 3   atorvastatin  (LIPITOR) 40 MG tablet Take 1 tablet (40 mg total) by mouth daily. (Patient not taking: Reported on 11/22/2021) 90 tablet 3   carvedilol (COREG) 3.125 MG tablet Take 1 tablet (3.125 mg total) by mouth 2 (two) times daily. (Patient not taking: Reported on 11/22/2021) 673 tablet 3   folic acid (FOLVITE) 1 MG tablet Take 1 tablet (1 mg total) by mouth daily. 90 tablet 0   gabapentin (NEURONTIN) 300 MG capsule Take 2 capsules (600 mg total) by mouth at bedtime. 60 capsule 0   losartan (COZAAR) 25 MG tablet Take 1 tablet (25 mg total) by mouth daily. 30 tablet 0   methotrexate (RHEUMATREX) 2.5 MG tablet Take 20 mg by mouth once a week. Take 8 tablets (20 mg) weekly. Caution:Chemotherapy. Protect from light. (Patient not taking: Reported on 05/11/2022)     minocycline (MINOCIN) 100 MG capsule Take 2 capsules (200 mg total) by mouth 2 (two) times daily. 120 capsule 3   nitroGLYCERIN (NITROSTAT) 0.4 MG SL tablet Place 1 tablet (0.4 mg total) under the tongue every 5 (five) minutes as needed for chest pain. 50 tablet 3   No current facility-administered medications on file prior to visit.     Review of System: Comprehensive ROS Pertinent positive and negative noted in HPI    Objective:  BP (!) 179/112   Pulse 91   Resp 16   Ht 5' 10.5" (1.791 m)   Wt 148 lb (67.1 kg)   SpO2 98%   BMI 20.94 kg/m  Filed Weights   05/11/22 0946  Weight: 148 lb (67.1 kg)    Physical Exam: General Appearance: Well nourished, in acute apparent distress. Eyes: PERRLA, EOMs, conjunctiva no swelling or erythema Sinuses: No Frontal/maxillary tenderness ENT/Mouth: Ext aud canals clear, TMs without erythema, bulging.  Hearing normal.  Neck: Supple, thyroid normal.  Respiratory: Respiratory effort normal, BS equal bilaterally without rales, rhonchi, wheezing or stridor.  Cardio: RRR with no MRGs. Brisk peripheral pulses without edema.  Abdomen: Soft, + BS.  Non tender, no guarding, rebound, hernias,  masses. Lymphatics: Non tender without lymphadenopathy.  Musculoskeletal: Full ROM, 5/5 strength, unstable gait.  Skin: Warm, dry without rashes, lesions, ecchymosis.  Neuro: Cranial nerves intact. Normal muscle tone, no cerebellar symptoms. Sensation intact.  Psych: Awake and oriented X 3, normal affect, Insight and Judgment appropriate.    Assessment:  Kanari was seen today for hospitalization follow-up.  Diagnoses and all orders for this visit:  Colon cancer screening -     Ambulatory referral to Gastroenterology  Osteomyelitis of left ankle, unspecified type ([place]) Followed by infectious disease Dr. Malena Edman discharge follow-up Referred to Dr Gwenlyn Found elevated Bp refilled medications and started Jardiance 30 day refills and schedule appt. infectious disease Dr. Drucilla Schmidt  Hypertension, unspecified type BP goal - < 130/80 Explained that having normal blood pressure is the goal and medications are helping to get to goal and maintain normal blood pressure. DIET: Limit salt intake, read nutrition labels to check salt content, limit fried and high fatty foods  Avoid using multisymptom OTC cold preparations that generally contain sudafed which can rise BP. Consult with pharmacist on best cold relief products to use for persons with HTN EXERCISE Discussed incorporating exercise such as walking - 30 minutes most days of the week and can do in 10 minute intervals    -     Ambulatory referral to Cardiology  Other orders/Medication refill -     carvedilol (COREG) 3.125 MG tablet; Take 1 tablet (3.125 mg total) by mouth 2 (two) times daily. -     losartan (COZAAR) 25 MG tablet; Take 1 tablet (25 mg total) by mouth daily. -     amLODipine (NORVASC) 10 MG tablet; Take 1 tablet (10 mg total) by mouth daily. -     empagliflozin (JARDIANCE) 10 MG TABS tablet; Take 1 tablet (10 mg total) by mouth daily before breakfast.    This note has been created with Designer, industrial/product. Any transcriptional errors are unintentional.   Kerin Perna, NP 05/11/2022, 10:00 AM

## 2022-05-16 ENCOUNTER — Ambulatory Visit: Payer: Managed Care, Other (non HMO) | Admitting: Internal Medicine

## 2022-05-16 ENCOUNTER — Other Ambulatory Visit: Payer: Self-pay

## 2022-05-16 DIAGNOSIS — M06322 Rheumatoid nodule, left elbow: Secondary | ICD-10-CM

## 2022-05-16 DIAGNOSIS — Z79899 Other long term (current) drug therapy: Secondary | ICD-10-CM

## 2022-05-16 DIAGNOSIS — M25572 Pain in left ankle and joints of left foot: Secondary | ICD-10-CM

## 2022-05-16 DIAGNOSIS — M069 Rheumatoid arthritis, unspecified: Secondary | ICD-10-CM

## 2022-05-18 ENCOUNTER — Other Ambulatory Visit: Payer: Self-pay

## 2022-05-19 NOTE — Progress Notes (Deleted)
Office Visit Note  Patient: Dennis Harris             Date of Birth: 06/05/1966           MRN: [id number]             PCP: Patient, No Pcp Per Referring: No ref. provider found Visit Date: 06/01/2022   Subjective:  No chief complaint on file.   History of Present Illness: Dennis Harris is a 56 y.o. male here for follow up for history of RA    Previous HPI 12/28/2021 Dennis Harris is a 56 y.o. male here for follow up for history of RA after starting methotrexate 15 mg PO weekly. He feels his joint pain and swelling are improved in most areas but the left ankle is not getting any better. He took prednisone initially but even while on this his ankle did not decrease swelling noticeably. 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   Previous HPI 11/22/2021 Dennis Harris is a 57 y.o. male here for joint pains he has history of RA treated with methotrexate and recent ED visits due to flare ups treated with prednisone.  He was diagnosed sometime around 2007 to 2009 with rheumatoid arthritis due to joint inflammation in multiple areas.  He was on oral methotrexate for years for treatment which had been effective.  Also treated episodically with steroids for active inflammation which was generally beneficial and no major side effects.  Most recent other rheumatology provider with Dr. Nickola Major but has not seen her for more than a year and due to loss of insurance when he lost his previous employment.  As result he has been off any maintenance treatment throughout last year.  He also has a history of degenerative arthritis and AVN and had anterior cervical spine fusion and right hip replacement. He developed skin rashes on his chest years ago saw dermatology a decade or more ago was not thought to represent any significant ongoing process.  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   Labs reviewed 05/2018 ANA neg RF 168 CCP >250   No Rheumatology ROS completed.   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   Hyperlipidemia 11/29/2019   Tobacco abuse 11/29/2019   Avascular necrosis of hip, right (HCC) 12/13/2016   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   Avascular necrosis of bones of both hips (HCC) 12/07/2016   Pain of left hip joint 12/07/2016   Pain of right hip joint 12/07/2016   SIRS (systemic inflammatory response syndrome) (HCC) 08/07/2014   Cardiomyopathy- EF 40-45% presumed secondary to acute illness 08/07/2014   CAP (community acquired pneumonia)    Right upper quadrant pain    Acute pericarditis 08/04/2014   Acute hyponatremia 08/04/2014   Protein-calorie malnutrition, severe (HCC) 08/04/2014  Sepsis ([place]) 08/03/2014   Legionella pneumonia ([place]) 08/03/2014   Rheumatoid arthritis ([place]) 08/03/2014   Chest pain 08/03/2014   Elevated troponin- not fel to be secondary to MI 08/03/2014   Nausea vomiting and diarrhea 08/03/2014   Cervical spondylosis without myelopathy 11/06/2012    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   Exertional shortness of breath    "at times" (11/14/2012)   Hypercholesteremia    Hypertension    Pericarditis ~ 2009   Pneumonia    Rheumatoid arthritis(714.0)     Family History   Problem Relation Age of Onset   Cancer Mother    Hypertension Mother    Stroke Mother    Diabetes Mother    Cirrhosis Father    Lupus Sister    Lupus Sister    Breast cancer Sister    Past Surgical History:  Procedure Laterality Date   ANTERIOR CERVICAL DECOMP/DISCECTOMY FUSION N/A 11/14/2012   Procedure: ANTERIOR CERVICAL DECOMPRESSION/DISCECTOMY FUSION 1 LEVEL C5-6;  Surgeon: Melina Schools, MD;  Location: [place];  Service: Orthopedics;  Laterality: N/A;   APPENDECTOMY  1970's   BACK SURGERY     CARDIAC CATHETERIZATION  May 2010   normal   I & D EXTREMITY Left 02/02/2022   Procedure: IRRIGATION AND DEBRIDEMENT LEFT FOOT/ANKLE;  Surgeon: Felipa Furnace, DPM;  Location: [place];  Service: Podiatry;  Laterality: Left;   I & D EXTREMITY Left 03/23/2022   Procedure: IRRIGATION AND DEBRIDEMENT EXTREMITY;  Surgeon: Lorenda Peck, DPM;  Location: [place];  Service: Podiatry;  Laterality: Left;   JOINT REPLACEMENT     TONSILLECTOMY  1970's?   TOTAL HIP ARTHROPLASTY Right 12/13/2016   TOTAL HIP ARTHROPLASTY Right 12/13/2016   Procedure: RIGHT TOTAL HIP ARTHROPLASTY ANTERIOR APPROACH;  Surgeon: Mcarthur Rossetti, MD;  Location: [place];  Service: Orthopedics;  Laterality: Right;   Social History   Social History Narrative   Not on file   Immunization History  Administered Date(s) Administered   Influenza,inj,Quad PF,6+ Mos 08/08/2014   PFIZER(Purple Top)SARS-COV-2 Vaccination 10/11/2019, 11/01/2019   PPD Test 06/18/2018   Pneumococcal Polysaccharide-23 08/08/2014   Tdap 11/15/2016     Objective: Vital Signs: There were no vitals taken for this visit.   Physical Exam   Musculoskeletal Exam: ***  CDAI Exam: CDAI Score: -- Patient Global: --; Provider Global: -- Swollen: --; Tender: -- Joint Exam 06/01/2022   No joint exam has been documented for this visit   There is currently no information documented on the homunculus. Go to the Rheumatology activity and complete the  homunculus joint exam.  Investigation: No additional findings.  Imaging: No results found.  Recent Labs: Lab Results  Component Value Date   WBC 9.4 03/24/2022   HGB 9.3 (L) 03/24/2022   PLT 388 03/24/2022   NA 134 (L) 03/24/2022   K 4.0 03/24/2022   CL 103 03/24/2022   CO2 23 03/24/2022   GLUCOSE 98 03/24/2022   BUN 9 03/24/2022   CREATININE 1.11 03/24/2022   BILITOT 0.2 03/07/2022   ALKPHOS 72 02/05/2022   AST 13 03/07/2022   ALT 10 03/07/2022   PROT 8.1 03/07/2022   ALBUMIN 2.3 (L) 02/05/2022   CALCIUM 8.9 03/24/2022   GFRAA >60 11/18/2019    Speciality Comments: No specialty comments available.  Procedures:  No procedures performed Allergies: Bee venom and Shellfish allergy   Assessment / Plan:     Visit Diagnoses:  No diagnosis found.  ***  Orders: No orders of the defined types were placed in this encounter.  No orders of the defined types were placed in this encounter.    Follow-Up Instructions: No follow-ups on file.   Bertram Savin, RT  Note - This record has been created using Editor, commissioning.  Chart creation errors have been sought, but may not always  have been located. Such creation errors do not reflect on  the standard of medical care.

## 2022-05-24 NOTE — Progress Notes (Deleted)
Cardiology Office Note:    Date:  05/24/2022   ID:  [name], DOB [birth year], MRN [id number]  PCP:  Patient, No Pcp Per [facility] Cardiologist: Nanetta Batty, MD   Reason for visit: Hypertension  History of Present Illness:    Dennis Harris is a 56 y.o. male with a hx of tobacco use, hypertension, hyperlipidemia, idiopathic pericarditis in 2009, normal heart cath in 2010, EF 40 to 45% in 2010 thought secondary to sepsis, septic ankle arthritis, rheumatoid arthritis, microcytic anemia.  He last saw Dr. Allyson Sabal in 2021 following episode of chest pain at work.  2D echo 2021 with EF 35 to 40%.  Patient was still smoking and mentioned fatigue and dyspnea.  Dr. Allyson Sabal referred him to Pharm.D. to start Entresto, added low-dose Coreg and decrease his amlodipine and chlorthalidone.  Plan was to recheck 2D echo in 3 months.  Pt was no show for 2D echo.  Patient was last admitted to the hospital and August 2023 with septic arthritis of the left ankle.  2D echo was checked and showed improved EF to 60-65%.  Patient had been off of Entresto.  Started on losartan.  ED d/c recs: Recommend outpatient HTN follow-up. He is on losartan, amlodipine, and carvedilol for HTN. He has been on Entresto and chlorthalidone in the past. Recommend starting [place] (his insurance will cover it) for GDMT.   Today, ***  History of nonischemic cardiomyopathy with recovered EF -normal heart cath in 2010, EF 40 to 45% in 2010 thought secondary to sepsis.   -Echo in 2021 with EF 35 to 40% -Echo September 2023 with EF 60 to 65% -***  Hypertension -*** -Goal BP is <130/80.  Recommend DASH diet (high in vegetables, fruits, low-fat dairy products, whole grains, poultry, fish, and nuts and low in sweets, sugar-sweetened beverages, and red meats), salt restriction and increase physical activity.  Hyperlipidemia -*** -Discussed cholesterol lowering diets - Mediterranean diet, DASH diet,  vegetarian diet, low-carbohydrate diet and avoidance of trans fats.  Discussed healthier choice substitutes.  Nuts, high-fiber foods, and fiber supplements may also improve lipids.    Obesity -Discussed how even a 5-10% weight loss can have cardiovascular benefits.   -Recommend moderate intensity activity for 30 minutes 5 days/week and the DASH diet.  Tobacco use  -Recommend tobacco cessation.  Reviewed physiologic effects of nicotine and the immediate-eventual benefits of quitting including improvement in cough/breathing and reduction in cardiovascular events.  Discussed quitting tips such as removing triggers and getting support from family/friends and Quitline [place]. -USPSTF recommends one-time screening for abdominal aortic aneurysm (AAA) by ultrasound in men 38 -45 years old who have ever smoked.      Disposition - Follow-up in ***     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   Exertional shortness of breath    "at times" (11/14/2012)   Hypercholesteremia    Hypertension    Pericarditis ~ 2009   Pneumonia    Rheumatoid arthritis(714.0)     Past Surgical History:  Procedure Laterality Date   ANTERIOR CERVICAL DECOMP/DISCECTOMY FUSION N/A 11/14/2012   Procedure: ANTERIOR CERVICAL DECOMPRESSION/DISCECTOMY FUSION 1 LEVEL C5-6;  Surgeon: Venita Lick, MD;  [facility];  Service: Orthopedics;  Laterality: N/A;   APPENDECTOMY  1970's   BACK SURGERY     CARDIAC CATHETERIZATION  May 2010   normal   I & D EXTREMITY Left 02/02/2022   Procedure: IRRIGATION AND  DEBRIDEMENT LEFT FOOT/ANKLE;  Surgeon: Felipa Furnace, DPM;  Location: [place];  Service: Podiatry;  Laterality: Left;   I & D EXTREMITY Left 03/23/2022   Procedure: IRRIGATION AND DEBRIDEMENT EXTREMITY;  Surgeon: Lorenda Peck, DPM;  Location: [place];  Service: Podiatry;  Laterality: Left;   JOINT REPLACEMENT     TONSILLECTOMY  1970's?   TOTAL HIP ARTHROPLASTY Right 12/13/2016    TOTAL HIP ARTHROPLASTY Right 12/13/2016   Procedure: RIGHT TOTAL HIP ARTHROPLASTY ANTERIOR APPROACH;  Surgeon: Mcarthur Rossetti, MD;  Location: [place];  Service: Orthopedics;  Laterality: Right;    Current Medications: No outpatient medications have been marked as taking for the 05/25/22 encounter (Appointment) with Warren Lacy, PA-C.     Allergies:   Bee venom and Shellfish allergy   Social History   Socioeconomic History   Marital status: Married    Spouse name: Not on file   Number of children: Not on file   Years of education: Not on file   Highest education level: Not on file  Occupational History   Not on file  Tobacco Use   Smoking status: Every Day    Packs/day: 0.50    Years: 37.00    Total pack years: 18.50    Types: Cigarettes   Smokeless tobacco: Never  Vaping Use   Vaping Use: Never used  Substance and Sexual Activity   Alcohol use: Yes    Alcohol/week: 14.0 standard drinks of alcohol    Types: 14 Cans of beer per week    Comment: 2-3 12 ounce cans of beer per day   Drug use: Not Currently    Types: Marijuana    Comment: 12/13/2016 "nothing in years"   Sexual activity: Yes  Other Topics Concern   Not on file  Social History Narrative   Not on file   Social Determinants of Health   Financial Resource Strain: Not on file  Food Insecurity: Not on file  Transportation Needs: Not on file  Physical Activity: Not on file  Stress: Not on file  Social Connections: Not on file     Family History: The patient's family history includes Breast cancer in his sister; Cancer in his mother; Cirrhosis in his father; Diabetes in his mother; Hypertension in his mother; Lupus in his sister and sister; Stroke in his mother.  ROS:   Please see the history of present illness.     EKGs/Labs/Other Studies Reviewed:    EKG:  The ekg ordered today demonstrates ***  Recent Labs: 02/05/2022: Magnesium 1.8 03/07/2022: ALT 10 03/24/2022: BUN 9; Creatinine, Ser  1.11; Hemoglobin 9.3; Platelets 388; Potassium 4.0; Sodium 134   Recent Lipid Panel Lab Results  Component Value Date/Time   CHOL 180 03/23/2022 02:35 AM   CHOL 223 (H) 11/29/2019 09:08 AM   TRIG 91 03/23/2022 02:35 AM   HDL 38 (L) 03/23/2022 02:35 AM   HDL 46 11/29/2019 09:08 AM   LDLCALC 124 (H) 03/23/2022 02:35 AM   LDLCALC 166 (H) 11/29/2019 09:08 AM    Physical Exam:    VS:  There were no vitals taken for this visit.   No data found.  No BP recorded.  {Refresh Note OR Click here to enter BP  :1}***    Wt Readings from Last 3 Encounters:  05/11/22 148 lb (67.1 kg)  03/22/22 160 lb (72.6 kg)  03/03/22 160 lb (72.6 kg)     GEN: *** Well nourished, well developed in no acute distress HEENT: Normal NECK:  No JVD; No carotid bruits CARDIAC: ***RRR, no murmurs, rubs, gallops RESPIRATORY:  Clear to auscultation without rales, wheezing or rhonchi  ABDOMEN: Soft, non-tender, non-distended MUSCULOSKELETAL: No edema SKIN: Warm and dry NEUROLOGIC:  Alert and oriented PSYCHIATRIC:  Normal affect     ASSESSMENT AND PLAN   ***   {Are you ordering a CV Procedure (e.g. stress test, cath, DCCV, TEE, etc)?   Press F2        :[id number]}    Medication Adjustments/Labs and Tests Ordered: Current medicines are reviewed at length with the patient today.  Concerns regarding medicines are outlined above.  No orders of the defined types were placed in this encounter.  No orders of the defined types were placed in this encounter.   There are no Patient Instructions on file for this visit.   Signed, Cannon Kettle, PA-C  05/24/2022 9:47 AM    [facility]

## 2022-05-25 ENCOUNTER — Ambulatory Visit: Payer: Managed Care, Other (non HMO) | Attending: Physician Assistant | Admitting: Physician Assistant

## 2022-05-25 DIAGNOSIS — I10 Essential (primary) hypertension: Secondary | ICD-10-CM

## 2022-05-25 DIAGNOSIS — Z720 Tobacco use: Secondary | ICD-10-CM

## 2022-05-25 DIAGNOSIS — I428 Other cardiomyopathies: Secondary | ICD-10-CM

## 2022-05-25 DIAGNOSIS — E782 Mixed hyperlipidemia: Secondary | ICD-10-CM

## 2022-05-30 ENCOUNTER — Encounter: Payer: Self-pay | Admitting: Physician Assistant

## 2022-06-01 ENCOUNTER — Ambulatory Visit: Payer: Self-pay | Admitting: Internal Medicine

## 2022-06-01 ENCOUNTER — Inpatient Hospital Stay: Payer: Managed Care, Other (non HMO) | Admitting: Family Medicine

## 2022-06-01 DIAGNOSIS — Z79899 Other long term (current) drug therapy: Secondary | ICD-10-CM

## 2022-06-01 DIAGNOSIS — M069 Rheumatoid arthritis, unspecified: Secondary | ICD-10-CM

## 2022-06-01 DIAGNOSIS — M25572 Pain in left ankle and joints of left foot: Secondary | ICD-10-CM

## 2022-06-01 DIAGNOSIS — M06322 Rheumatoid nodule, left elbow: Secondary | ICD-10-CM

## 2022-06-02 ENCOUNTER — Telehealth: Payer: Self-pay | Admitting: Podiatry

## 2022-06-02 NOTE — Telephone Encounter (Signed)
Dennis Harris called because he is scheduled for a appt. 11/10, but he does not have any insurance and does not have the funds to pay, no longer working. His long term disability denied his claim, because his rheumatoid arthritis was preexisting, they said he had it 15 years ago and that's why he had the sepsis. Anyway he said that he really needs to see you and was wondering if there was anyway that could be worked out. He is still having a lot of pain and swelling.

## 2022-06-03 ENCOUNTER — Ambulatory Visit: Payer: Managed Care, Other (non HMO) | Admitting: Podiatry

## 2022-06-07 ENCOUNTER — Ambulatory Visit (INDEPENDENT_AMBULATORY_CARE_PROVIDER_SITE_OTHER): Payer: Self-pay

## 2022-06-07 ENCOUNTER — Other Ambulatory Visit: Payer: Self-pay

## 2022-06-07 ENCOUNTER — Ambulatory Visit (INDEPENDENT_AMBULATORY_CARE_PROVIDER_SITE_OTHER): Payer: Self-pay | Admitting: Podiatry

## 2022-06-07 ENCOUNTER — Ambulatory Visit: Payer: Self-pay

## 2022-06-07 DIAGNOSIS — M00872 Arthritis due to other bacteria, left ankle and foot: Secondary | ICD-10-CM

## 2022-06-07 NOTE — Progress Notes (Signed)
Subjective: Chief Complaint  Patient presents with   Arthritis    Patient came for left foot and ankle pain,   56 year old male presents the office today for follow-up evaluation of left ankle pain, septic arthritis.  He states the swelling is improved but still having pain to the ankle. He is status post I&D, debridement for septic ankle.  He has not yet started back on his rheumatoid medications.  Unfortunately has been let go from his job.  Per the discharge: On this admission, he was s/p irrigation & debridement with podiatry on 02/02/22 which isolated stenotrophomonas maltophilia and eggartella catenaformis treated with subsequent 4-week course of Bactrim/minocycline. On 03/23/22, he had another irrigation and debridgement with podiatry which yielded left ankle culture x2 and bone biopsy x2. He was then started on Unasyn and minocycline, and switched to oral minocycline and Augmentin.  He was discharged with a CAM boot and walker/cane.    Specimen Description WOUND   Special Requests LEFT TALUS SPEC D   Gram Stain NO WBC SEEN  NO ORGANISMS SEEN   Culture No growth aerobically or anaerobically.  Performed at [facility]    Specimen Description WOUND   Special Requests LEFT DISTAL TIBIA   Gram Stain NO WBC SEEN  NO ORGANISMS SEEN   Culture No growth aerobically or anaerobically.  Performed at [facility], [address], [place], [place] [address]   Report Status 03/28/2022 FINAL    Specimen Description WOUND   Special Requests LEFT ANKLE JOINT SPEC B   Gram Stain FEW WBC PRESENT, PREDOMINANTLY PMN  NO ORGANISMS SEEN   Culture No growth aerobically or anaerobically.  Performed at [facility], [address], [place], [place] [address]   Report Status 03/28/2022 FINAL    Objective: Specimen Description WOUND   Special Requests LEFT ANKLE JOINT FLUID SPEC A   Gram Stain NO WBC SEEN  NO ORGANISMS SEEN   Culture No growth aerobically or anaerobically.   Performed at [facility], [address], [place], [place] [address]   Report Status 03/28/2022 FINAL   AAO x3, NAD DP/PT pulses palpable bilaterally, CRT less than 3 seconds Incisions are all well-healed.  There is still edema to the ankle but this appears to be improved compared to prior appointments.  There is no erythema or warmth.  He has diffuse tenderness of the ankle but no specific area of tenderness.  Seems to be somewhat better compared to prior.  No pain with calf compression, swelling, warmth, erythema  Assessment: 56 year old male with septic ankle  Plan: -All treatment options discussed with the patient including all alternatives, risks, complications.  -X-rays obtained reviewed.  No definitive cortical changes suggestive of osteomyelitis. -Prescription for compression socks. -Continue antibiotics.  States he is still taking them. -Clinically seems to be doing somewhat better.  Unfortunately now he is lost his job and does not have insurance.  Can hold off any further advanced imaging for now and continue monitor this very closely clinically.  Eating very well aware of signs of infection and should any thing change to let us know or go back to the emergency room.  I did not charge the patient for today's visit due to financial constraints.  He is at high risk of limb loss and swelling to keep check of his ankle.  Vivi Barrack DPM

## 2022-06-08 ENCOUNTER — Ambulatory Visit (INDEPENDENT_AMBULATORY_CARE_PROVIDER_SITE_OTHER): Payer: Managed Care, Other (non HMO) | Admitting: Primary Care

## 2022-06-20 ENCOUNTER — Other Ambulatory Visit: Payer: Self-pay | Admitting: Podiatry

## 2022-06-20 DIAGNOSIS — M00872 Arthritis due to other bacteria, left ankle and foot: Secondary | ICD-10-CM

## 2022-07-06 ENCOUNTER — Ambulatory Visit (INDEPENDENT_AMBULATORY_CARE_PROVIDER_SITE_OTHER): Payer: Managed Care, Other (non HMO) | Admitting: Primary Care

## 2022-07-21 ENCOUNTER — Ambulatory Visit: Payer: Self-pay | Admitting: Podiatry

## 2022-08-05 ENCOUNTER — Ambulatory Visit (INDEPENDENT_AMBULATORY_CARE_PROVIDER_SITE_OTHER): Payer: Self-pay | Admitting: Podiatry

## 2022-08-05 DIAGNOSIS — M00872 Arthritis due to other bacteria, left ankle and foot: Secondary | ICD-10-CM

## 2022-08-05 NOTE — Progress Notes (Signed)
Subjective: He thinks it is going down. Pain more on the lateral side. No injuries  Denies any systemic complaints such as fevers, chills, nausea, vomiting. No acute changes since last appointment, and no other complaints at this time.   Objective: AAO x3, NAD DP/PT pulses palpable bilaterally, CRT less than 3 seconds Protective sensation intact with Simms Weinstein monofilament, vibratory sensation intact, Achilles tendon reflex intact No areas of pinpoint bony tenderness or pain with vibratory sensation. MMT 5/5, ROM WNL. No edema, erythema, increase in warmth to bilateral lower extremities.  No open lesions or pre-ulcerative lesions.  No pain with calf compression, swelling, warmth, erythema  Assessment:  Plan: -All treatment options discussed with the patient including all alternatives, risks, complications.  -he has MTX at home -he is tried to wear his boots at home and walk around. He is hoping in the next month.  -Patient encouraged to call the office with any questions, concerns, change in symptoms.

## 2022-10-04 ENCOUNTER — Ambulatory Visit: Payer: Self-pay | Admitting: Podiatry

## 2022-10-24 ENCOUNTER — Ambulatory Visit: Payer: Self-pay | Admitting: Podiatry

## 2022-11-22 ENCOUNTER — Ambulatory Visit (INDEPENDENT_AMBULATORY_CARE_PROVIDER_SITE_OTHER): Payer: Self-pay

## 2022-11-22 ENCOUNTER — Ambulatory Visit (INDEPENDENT_AMBULATORY_CARE_PROVIDER_SITE_OTHER): Payer: Self-pay | Admitting: Podiatry

## 2022-11-22 ENCOUNTER — Other Ambulatory Visit: Payer: Self-pay

## 2022-11-22 DIAGNOSIS — M00072 Staphylococcal arthritis, left ankle and foot: Secondary | ICD-10-CM

## 2022-11-22 DIAGNOSIS — M7752 Other enthesopathy of left foot: Secondary | ICD-10-CM

## 2022-11-22 MED ORDER — METHYLPREDNISOLONE 4 MG PO TBPK
ORAL_TABLET | ORAL | 0 refills | Status: DC
  Filled 2022-11-22: qty 21, 6d supply, fill #0

## 2022-11-24 ENCOUNTER — Other Ambulatory Visit: Payer: Self-pay

## 2022-11-25 NOTE — Progress Notes (Signed)
Subjective: Chief Complaint  Patient presents with   Ankle Pain    Rm 12 Left ankle pain. Pt states pain has not improved even after the surgeries. Pt states pain and edema has increased in the last month.     57 year old male presents the office today with above concerns.  Since the ankle has been doing better he is having pain more to the lateral aspect of the leg points along the fifth metatarsal base.  No injuries that he reports.  He has returned back to the arthritis medication.  No fever or chills.  No open lesions.     Objective: AAO x3, NAD DP/PT pulses palpable bilaterally, CRT less than 3 seconds There is also mild edema present to the ankle but there is no erythema or warmth.  There is no tenderness palpation on exam today and there is no pain with ankle joint motion.  The majority tenderness is localized along the fifth metatarsal base, lateral aspect of the foot.  There is no erythema or warmth.  MMT 5/5. No pain with calf compression, swelling, warmth, erythema   Assessment: History of septic arthritis ankle; tendinitis; RA  Plan: -All treatment options discussed with the patient including all alternatives, risks, complications.  -Repeat x-rays of the ankle were obtained and reviewed.  No evidence of acute fracture noted today.  Joint space maintained.  No definitive evidence of cortical destruction suggest osteomyelitis.  Edema noted to the ankle. -Dispensed compression anklet.  I discussed with family.  MRI however given lack of insurance not able to afford it.  No obvious signs of infection noted today.  Results to monitor closely.  Continue arthritis medications. -There is no charge for today's visit as he is currently applied for [facility] card.  Once approved we will likely order MRI.  Vivi Barrack DPM

## 2023-04-03 ENCOUNTER — Telehealth: Payer: Self-pay

## 2023-04-03 NOTE — Telephone Encounter (Signed)
Patient called and left a message - His long term disability has been denied, he applied for the [facility] card, but that was also denied. He is still having pain and swelling in that left ankle - He needs more medicine, but has no means to pay for a visit. (Last note states he would need an MRI after getting the [employer] card) Patient is not sure what to do next. Please call to advise

## 2023-04-21 ENCOUNTER — Other Ambulatory Visit: Payer: Self-pay

## 2023-04-21 ENCOUNTER — Telehealth: Payer: Self-pay | Admitting: Podiatry

## 2023-04-21 ENCOUNTER — Other Ambulatory Visit: Payer: Self-pay | Admitting: Podiatry

## 2023-04-21 MED ORDER — METHYLPREDNISOLONE 4 MG PO TBPK
ORAL_TABLET | ORAL | 0 refills | Status: DC
  Filled 2023-04-21: qty 21, 6d supply, fill #0

## 2023-04-21 NOTE — Telephone Encounter (Signed)
Called pt and he is scheduled to see you 05/04/2023 but was asking if he could get some prednisone sent in to the pharmacy. He is having the left ankle/calf area swelling again and last time it seemed to help him. I gave him the number for financial assistance to see if he could get some help. I did tell pt that we would not be able to accept the [employer] card if he were to get it.( He was denied)

## 2023-04-24 ENCOUNTER — Other Ambulatory Visit: Payer: Self-pay

## 2023-05-01 ENCOUNTER — Ambulatory Visit: Payer: Self-pay | Admitting: Podiatry

## 2023-05-04 ENCOUNTER — Ambulatory Visit: Payer: Self-pay | Admitting: Podiatry

## 2023-06-25 DIAGNOSIS — Z419 Encounter for procedure for purposes other than remedying health state, unspecified: Secondary | ICD-10-CM | POA: Diagnosis not present

## 2023-07-05 ENCOUNTER — Ambulatory Visit (INDEPENDENT_AMBULATORY_CARE_PROVIDER_SITE_OTHER): Payer: Medicaid Other | Admitting: Primary Care

## 2023-07-11 ENCOUNTER — Other Ambulatory Visit: Payer: Self-pay

## 2023-07-11 ENCOUNTER — Ambulatory Visit (INDEPENDENT_AMBULATORY_CARE_PROVIDER_SITE_OTHER): Payer: Medicaid Other | Admitting: Primary Care

## 2023-07-11 ENCOUNTER — Other Ambulatory Visit (INDEPENDENT_AMBULATORY_CARE_PROVIDER_SITE_OTHER): Payer: Self-pay | Admitting: Primary Care

## 2023-07-11 ENCOUNTER — Other Ambulatory Visit (INDEPENDENT_AMBULATORY_CARE_PROVIDER_SITE_OTHER): Payer: Self-pay

## 2023-07-11 ENCOUNTER — Encounter (INDEPENDENT_AMBULATORY_CARE_PROVIDER_SITE_OTHER): Payer: Self-pay | Admitting: Primary Care

## 2023-07-11 VITALS — BP 150/98 | HR 95 | Resp 16 | Ht 70.5 in | Wt 154.6 lb

## 2023-07-11 DIAGNOSIS — M009 Pyogenic arthritis, unspecified: Secondary | ICD-10-CM

## 2023-07-11 DIAGNOSIS — Z2821 Immunization not carried out because of patient refusal: Secondary | ICD-10-CM | POA: Diagnosis not present

## 2023-07-11 DIAGNOSIS — E782 Mixed hyperlipidemia: Secondary | ICD-10-CM

## 2023-07-11 DIAGNOSIS — Z1211 Encounter for screening for malignant neoplasm of colon: Secondary | ICD-10-CM

## 2023-07-11 DIAGNOSIS — I10 Essential (primary) hypertension: Secondary | ICD-10-CM

## 2023-07-11 MED ORDER — LOSARTAN POTASSIUM 25 MG PO TABS
25.0000 mg | ORAL_TABLET | Freq: Every day | ORAL | 0 refills | Status: DC
  Filled 2023-07-11 – 2023-09-22 (×4): qty 90, 90d supply, fill #0

## 2023-07-11 MED ORDER — IBUPROFEN 800 MG PO TABS
800.0000 mg | ORAL_TABLET | Freq: Three times a day (TID) | ORAL | 1 refills | Status: AC | PRN
  Filled 2023-07-11: qty 90, 30d supply, fill #0
  Filled 2023-07-28 – 2023-09-22 (×2): qty 90, 30d supply, fill #1

## 2023-07-11 MED ORDER — AMLODIPINE BESYLATE 10 MG PO TABS
10.0000 mg | ORAL_TABLET | Freq: Every day | ORAL | 0 refills | Status: DC
  Filled 2023-07-11 – 2023-09-22 (×4): qty 90, 90d supply, fill #0

## 2023-07-11 MED ORDER — PREDNISONE 50 MG PO TABS
50.0000 mg | ORAL_TABLET | Freq: Every day | ORAL | 0 refills | Status: DC
  Filled 2023-07-11: qty 5, 5d supply, fill #0

## 2023-07-11 NOTE — Progress Notes (Signed)
[facility]  Dennis Harris, is a 57 y.o. male  [id number]  [id number]:[id number]  DOB - June 19, 1966  Chief Complaint  Patient presents with   Hypertension       Subjective:   Dennis Harris is a 57 y.o. male here today for a follow up visit. Patient has No headache, No chest pain, No abdominal pain - No Nausea, No new weakness tingling or numbness, No Cough - shortness of breath Hypertension This is a chronic problem. The current episode started more than 1 year ago. The problem has been gradually worsening since onset. The problem is uncontrolled. Associated symptoms include anxiety and headaches. Agents associated with hypertension include NSAIDs. Risk factors for coronary artery disease include male gender and stress. Past treatments include beta blockers and angiotensin blockers. Compliance problems: no meds due to loss job.     No problems updated.  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 30 tablet 0   losartan (COZAAR) 25 MG tablet Take 1 tablet (25 mg total) by mouth daily. 30 tablet 0   No current facility-administered medications on file prior to visit.   Health Maintenance  Topic Date Due   Colon Cancer Screening  Never done   COVID-19 Vaccine (3 - Pfizer risk series) 11/29/2019   Zoster (Shingles) Vaccine (1 of 2) 10/09/2023*   Flu Shot  10/23/2023*   DTaP/Tdap/Td vaccine (2 - Td or Tdap)  11/16/2026   Hepatitis C Screening  Completed   HIV Screening  Completed   HPV Vaccine  Aged Out  *Topic was postponed. The date shown is not the original due date.    Objective:   Vitals:   07/11/23 0955 07/11/23 0956 07/11/23 1041  BP: (!) 176/105 (!) 164/101 (!) 150/98  Pulse: 95    Resp: 16    SpO2: 100%    Weight: 154 lb 9.6 oz (70.1 kg)    Height: 5' 10.5" (1.791 m)     BP Readings from Last 3 Encounters:  07/11/23 (!) 150/98  05/11/22 (!) 179/112  03/25/22 119/72      Physical Exam Constitutional:      Appearance: Normal appearance. He is normal weight.  HENT:     Head: Normocephalic.     Right Ear: Tympanic membrane and external ear normal.     Left Ear: Tympanic membrane and external ear normal.     Nose: Nose normal.     Mouth/Throat:     Comments: Poor endentation  Eyes:     Extraocular Movements: Extraocular movements intact.     Pupils: Pupils are equal, round, and reactive to light.  Neck:     Comments: stiff Cardiovascular:     Rate and Rhythm: Normal rate and regular rhythm.  Pulmonary:     Effort: Pulmonary effort is normal.     Breath sounds: Normal breath sounds.  Abdominal:  General: Bowel sounds are normal.     Palpations: Abdomen is soft.  Musculoskeletal:     Cervical back: Normal range of motion.     Comments: Decrease secondary to rheumatoid arthritis   Skin:    General: Skin is warm and dry.  Neurological:     Mental Status: He is alert and oriented to person, place, and time.  Psychiatric:        Mood and Affect: Mood normal.        Behavior: Behavior normal.       Assessment & Plan  Revin was seen today for hypertension.  Diagnoses and all orders for this visit:  Influenza vaccination declined  Herpes zoster vaccination declined  Septic arthritis of left ankle, due to unspecified organism (HCC) Other orders -     ibuprofen (ADVIL) 800 MG tablet; Take 1 tablet (800 mg total) by mouth every 8 (eight) hours as  needed. -     predniSONE (DELTASONE) 50 MG tablet; Take 1 tablet (50 mg total) by mouth daily. -     CBC with Differential/Platelet -     CMP14+EGFR  Colon cancer screening -     Ambulatory referral to Gastroenterology  Mixed hyperlipidemia  Healthy lifestyle diet of fruits vegetables fish nuts whole grains and low saturated fat . Foods high in cholesterol or liver, fatty meats,cheese, butter avocados, nuts and seeds, chocolate and fried foods.    -     Lipid panel  Hypertension, unspecified type BP goal - < 130/80 Explained that having normal blood pressure is the goal and medications are helping to get to goal and maintain normal blood pressure. DIET: Limit salt intake, read nutrition labels to check salt content, limit fried and high fatty foods  Avoid using multisymptom OTC cold preparations that generally contain sudafed which can rise BP. Consult with pharmacist on best cold relief products to use for persons with HTN EXERCISE Discussed incorporating exercise such as walking - 30 minutes most days of the week and can do in 10 minute intervals    -     CMP14+EGFR       Patient have been counseled extensively about nutrition and exercise. Other issues discussed during this visit include: low cholesterol diet, weight control and daily exercise, foot care, annual eye examinations at Ophthalmology, importance of adherence with medications and regular follow-up. We also discussed long term complications of uncontrolled diabetes and hypertension.   No follow-ups on file.  The patient was given clear instructions to go to ER or return to medical center if symptoms don't improve, worsen or new problems develop. The patient verbalized understanding. The patient was told to call to get lab results if they haven't heard anything in the next week.   This note has been created with Education officer, environmental. Any transcriptional errors are unintentional.    [name], NP 07/11/2023, 3:33 PM

## 2023-07-12 LAB — CBC WITH DIFFERENTIAL/PLATELET
Basophils Absolute: 0 10*3/uL (ref 0.0–0.2)
Basos: 0 %
EOS (ABSOLUTE): 0 10*3/uL (ref 0.0–0.4)
Eos: 0 %
Hematocrit: 37 % — ABNORMAL LOW (ref 37.5–51.0)
Hemoglobin: 11.1 g/dL — ABNORMAL LOW (ref 13.0–17.7)
Immature Grans (Abs): 0 10*3/uL (ref 0.0–0.1)
Immature Granulocytes: 0 %
Lymphocytes Absolute: 1.5 10*3/uL (ref 0.7–3.1)
Lymphs: 17 %
MCH: 24.3 pg — ABNORMAL LOW (ref 26.6–33.0)
MCHC: 30 g/dL — ABNORMAL LOW (ref 31.5–35.7)
MCV: 81 fL (ref 79–97)
Monocytes Absolute: 0.4 10*3/uL (ref 0.1–0.9)
Monocytes: 4 %
Neutrophils Absolute: 6.9 10*3/uL (ref 1.4–7.0)
Neutrophils: 79 %
Platelets: 506 10*3/uL — ABNORMAL HIGH (ref 150–450)
RBC: 4.57 x10E6/uL (ref 4.14–5.80)
RDW: 13.6 % (ref 11.6–15.4)
WBC: 8.9 10*3/uL (ref 3.4–10.8)

## 2023-07-12 LAB — CMP14+EGFR
ALT: 15 [IU]/L (ref 0–44)
AST: 22 [IU]/L (ref 0–40)
Albumin: 3.7 g/dL — ABNORMAL LOW (ref 3.8–4.9)
Alkaline Phosphatase: 142 [IU]/L — ABNORMAL HIGH (ref 44–121)
BUN/Creatinine Ratio: 11 (ref 9–20)
BUN: 9 mg/dL (ref 6–24)
Bilirubin Total: 0.3 mg/dL (ref 0.0–1.2)
CO2: 20 mmol/L (ref 20–29)
Calcium: 9.2 mg/dL (ref 8.7–10.2)
Chloride: 97 mmol/L (ref 96–106)
Creatinine, Ser: 0.85 mg/dL (ref 0.76–1.27)
Globulin, Total: 5.6 g/dL — ABNORMAL HIGH (ref 1.5–4.5)
Glucose: 74 mg/dL (ref 70–99)
Potassium: 5.2 mmol/L (ref 3.5–5.2)
Sodium: 134 mmol/L (ref 134–144)
Total Protein: 9.3 g/dL — ABNORMAL HIGH (ref 6.0–8.5)
eGFR: 101 mL/min/{1.73_m2} (ref >59)

## 2023-07-12 LAB — LIPID PANEL
Chol/HDL Ratio: 5.7 {ratio} — ABNORMAL HIGH (ref 0.0–5.0)
Cholesterol, Total: 226 mg/dL — ABNORMAL HIGH (ref 100–199)
HDL: 40 mg/dL (ref >39)
LDL Chol Calc (NIH): 168 mg/dL — ABNORMAL HIGH (ref 0–99)
Triglycerides: 101 mg/dL (ref 0–149)
VLDL Cholesterol Cal: 18 mg/dL (ref 5–40)

## 2023-07-20 ENCOUNTER — Other Ambulatory Visit: Payer: Self-pay

## 2023-07-21 ENCOUNTER — Ambulatory Visit (INDEPENDENT_AMBULATORY_CARE_PROVIDER_SITE_OTHER): Payer: Medicaid Other

## 2023-07-21 VITALS — BP 167/98

## 2023-07-21 DIAGNOSIS — Z0130 Encounter for examination of blood pressure without abnormal findings: Secondary | ICD-10-CM

## 2023-07-21 NOTE — Progress Notes (Unsigned)
   Blood Pressure Recheck Visit  Name: Dennis Harris MRN: [id number] Date of Birth: November 01, 1965  [name] presents today for Blood Pressure recheck with clinical support staff.    BP Readings from Last 3 Encounters:  07/21/23 (!) 167/98  07/11/23 (!) 150/98  05/11/22 (!) 179/112    Current Outpatient Medications  Medication Sig Dispense Refill   amLODipine (NORVASC) 10 MG tablet Take 1 tablet (10 mg total) by mouth daily. 90 tablet 0   ibuprofen (ADVIL) 800 MG tablet Take 1 tablet (800 mg total) by mouth every 8 (eight) hours as needed. 90 tablet 1   losartan (COZAAR) 25 MG tablet Take 1 tablet (25 mg total) by mouth daily. 90 tablet 0   predniSONE (DELTASONE) 50 MG tablet Take 1 tablet (50 mg total) by mouth daily. 5 tablet 0   No current facility-administered medications for this visit.    Hypertensive Medication Review: Patient states that they are taking all their hypertensive medications as prescribed and their last dose of hypertensive medications was this morning around 6:30am   Documentation of any medication adherence discrepancies: Pt has drunk 3 cups of coffee   Provider Recommendation:  Spoke to [place] and she stated: she will discuss this with him and will try to reduce amount of caffeine on a daily basis secondary to stimulant and increase blood pressure   Pt will need a 2 week bp check   Patient has been given provider's recommendations and does not have any questions or concerns at this time. Patient will contact the office for any future questions or concerns.

## 2023-07-23 ENCOUNTER — Other Ambulatory Visit (INDEPENDENT_AMBULATORY_CARE_PROVIDER_SITE_OTHER): Payer: Self-pay | Admitting: Primary Care

## 2023-07-23 DIAGNOSIS — E782 Mixed hyperlipidemia: Secondary | ICD-10-CM

## 2023-07-23 MED ORDER — ATORVASTATIN CALCIUM 40 MG PO TABS
40.0000 mg | ORAL_TABLET | Freq: Every day | ORAL | 1 refills | Status: DC
  Filled 2023-07-23: qty 90, 90d supply, fill #0

## 2023-07-24 ENCOUNTER — Ambulatory Visit (INDEPENDENT_AMBULATORY_CARE_PROVIDER_SITE_OTHER): Payer: Medicaid Other | Admitting: Podiatry

## 2023-07-24 ENCOUNTER — Encounter: Payer: Self-pay | Admitting: Podiatry

## 2023-07-24 ENCOUNTER — Other Ambulatory Visit: Payer: Self-pay

## 2023-07-24 ENCOUNTER — Ambulatory Visit (INDEPENDENT_AMBULATORY_CARE_PROVIDER_SITE_OTHER): Payer: Medicaid Other

## 2023-07-24 DIAGNOSIS — M778 Other enthesopathies, not elsewhere classified: Secondary | ICD-10-CM

## 2023-07-24 DIAGNOSIS — M009 Pyogenic arthritis, unspecified: Secondary | ICD-10-CM

## 2023-07-24 DIAGNOSIS — M7752 Other enthesopathy of left foot: Secondary | ICD-10-CM

## 2023-07-25 NOTE — Progress Notes (Signed)
Subjective: Chief Complaint  Patient presents with   Foot Pain    RM# 14 Patient states foot pain and swelling has been hurting for a few years and wasn't able to get in sooner due to lack of ins. Wanted to have his foot looked at ago to make sure all is okay.    57 year old male presents the office today with above concerns.  States he has been doing well but he wants to have his foot occasional swelling.  He is still not back on his rheumatoid medications.  He has been trying to wear steel toed shoe and send he feels okay and some days it does not.  He has not missed any open lesions or any warmth to the ankle.  Objective: AAO x3, NAD DP/PT pulses palpable bilaterally, CRT less than 3 seconds Regards to the left foot and ankle there is some trace edema present without any erythema or warmth.  Ankle range of motion intact and pain-free.  Flatfoot is present.  Not able to appreciate any area pinpoint tenderness.  Flexor, extensor tendons appear to be intact.  MMT 5/5. No pain with calf compression, swelling, warmth, erythema   Assessment: History of septic arthritis ankle; tendinitis; RA  Plan: -All treatment options discussed with the patient including all alternatives, risks, complications.  -X-rays were obtained of the foot and ankle today.  Multiple views were obtained.  There is no evidence of acute fracture.  There is no cortical destruction suggest osteomyelitis at this time.  Calcaneal spurring present. -Clinically seems to be doing well no signs of infection clinically.  I do want to check blood work check a CBC, sed rate, CRP although he is also not on his rheumatoid medication which could elevate the CRP, sed rate.  If further concern about infection will repeat MRI or advanced imaging.  For now it seems to be doing well over the last.  Also I am hoping that once he gets back on his rheumatoid medications will be beneficial.  Unfortunately has not been with medication for quite some  time since he has been without insurance.   No follow-ups on file.  Vivi Barrack DPM

## 2023-07-26 DIAGNOSIS — Z419 Encounter for procedure for purposes other than remedying health state, unspecified: Secondary | ICD-10-CM | POA: Diagnosis not present

## 2023-07-28 ENCOUNTER — Other Ambulatory Visit: Payer: Self-pay

## 2023-08-03 ENCOUNTER — Other Ambulatory Visit: Payer: Self-pay

## 2023-08-04 ENCOUNTER — Ambulatory Visit (INDEPENDENT_AMBULATORY_CARE_PROVIDER_SITE_OTHER): Payer: Medicaid Other

## 2023-08-08 NOTE — Progress Notes (Signed)
Office Visit Note  Patient: Dennis Harris             Date of Birth: 1965/11/12           MRN: [id number]             PCP: [name], NP Referring: No ref. provider found Visit Date: 08/21/2023   Subjective:  Follow-up (Patient states he needs something to help with his arthritis pain. )   Discussed the use of AI scribe software for clinical note transcription with the patient, who gave verbal consent to proceed.  History of Present Illness   Dennis Harris is a 58 y.o. male here for follow up for RA currently off treatment due to a long interval after needing surgery for left ankle septic joint and subsequently was without health insurance until establishing medicaid last November. He presents with worsening joint pain and swelling. They report that their symptoms have been progressively worsening, affecting their mobility and quality of life. The patient describes the pain as intense and affecting multiple joints, including the shoulders, hands, and ankles. They also report morning stiffness, requiring them to take small steps and lean against the wall for support upon waking.  The patient has a history of septic arthritis in the left ankle, which required two surgeries in 2023. Despite the surgeries, the patient reports persistent swelling in the ankle. They also report the development of nodules on their right index finger and elbow, which they initially believed to be cysts.  The patient has been off methotrexate, their previous RA medication, due to issues with Medicaid. They report a significant increase in symptoms since discontinuing the medication. They have been managing their symptoms with ibuprofen, but report that it is no longer effective. They also report a brief period of symptom relief after a short course of prednisone prescribed by their primary care provider.   Previous HPI 12/28/2021 Dennis Harris is a 58 y.o. male here for follow up for   history of RA after starting methotrexate 15 mg PO weekly. He feels his joint pain and swelling are improved in most areas but the left ankle is not getting any better. He took prednisone initially but even while on this his ankle did not decrease swelling noticeably. 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   11/22/2021 Dennis Harris is a 58 y.o. male here for joint pains he has history of RA treated with methotrexate and recent ED visits due to flare ups treated with prednisone.  He was diagnosed sometime around 2007 to 2009 with rheumatoid arthritis due to joint inflammation in multiple areas.  He was on oral methotrexate for years for treatment which had been effective.  Also treated episodically with steroids for active inflammation which was generally beneficial and no major side effects.  Most recent other rheumatology provider with Dr. Nickola Major but has not seen her for more than a year and due to loss of insurance when he lost his previous employment.  As result he has been off any maintenance treatment throughout last year.  He also has a history of degenerative arthritis and AVN and had anterior cervical spine fusion and right hip replacement. He developed skin rashes on his chest years ago saw dermatology a decade or more ago was not thought to represent any significant ongoing process.  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   Labs reviewed 05/2018 ANA neg RF 168 CCP >250   Review of Systems  Constitutional:  Negative for fatigue.  HENT:  Negative for mouth sores and mouth dryness.   Eyes:  Negative for dryness.  Respiratory:  Negative for shortness of breath.   Cardiovascular:  Negative for chest pain and palpitations.  Gastrointestinal:  Negative for blood in stool, constipation and diarrhea.  Endocrine: Negative for increased urination.   Genitourinary:  Negative for involuntary urination.  Musculoskeletal:  Positive for joint pain, joint pain, joint swelling, myalgias, morning stiffness and myalgias. Negative for gait problem, muscle weakness and muscle tenderness.  Skin:  Negative for color change, rash, hair loss and sensitivity to sunlight.  Allergic/Immunologic: Negative for susceptible to infections.  Neurological:  Negative for dizziness and headaches.  Hematological:  Negative for swollen glands.  Psychiatric/Behavioral:  Positive for sleep disturbance. Negative for depressed mood. The patient is not nervous/anxious.     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   Hyperlipidemia 11/29/2019   Tobacco abuse 11/29/2019   Avascular necrosis of hip, right (HCC) 12/13/2016   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   Avascular necrosis of bones of both hips (HCC) 12/07/2016   Pain of left hip joint 12/07/2016   Pain of right hip joint 12/07/2016   SIRS (systemic inflammatory response syndrome) (HCC) 08/07/2014   Cardiomyopathy- EF 40-45% presumed secondary to acute illness 08/07/2014   CAP (community acquired pneumonia)    Right upper quadrant pain    Acute pericarditis 08/04/2014   Acute hyponatremia 08/04/2014   Protein-calorie malnutrition, severe (HCC) 08/04/2014   Sepsis (HCC) 08/03/2014   Legionella pneumonia (HCC) 08/03/2014   Rheumatoid arthritis (HCC) 08/03/2014    Chest pain 08/03/2014   Elevated troponin- not fel to be secondary to MI 08/03/2014   Nausea vomiting and diarrhea 08/03/2014   Cervical spondylosis without myelopathy 11/06/2012    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   Exertional shortness of breath    "at times" (11/14/2012)   Hypercholesteremia    Hypertension    Pericarditis ~ 2009   Pneumonia    Rheumatoid arthritis(714.0)    Sepsis (HCC)    in the left ankle    Family History  Problem Relation Age of Onset   Cancer Mother    Hypertension Mother    Stroke Mother    Diabetes Mother    Cirrhosis Father    Lupus Sister    Lupus Sister    Breast cancer Sister    Past Surgical History:  Procedure Laterality Date   ANTERIOR CERVICAL DECOMP/DISCECTOMY FUSION N/A 11/14/2012   Procedure: ANTERIOR CERVICAL DECOMPRESSION/DISCECTOMY FUSION 1 LEVEL C5-6;  Surgeon: Venita Lick, MD;  Location: [facility] OR;  Service: Orthopedics;  Laterality: N/A;   APPENDECTOMY  1970's   BACK SURGERY     CARDIAC CATHETERIZATION  May  2010   normal   I & D EXTREMITY Left 02/02/2022   Procedure: IRRIGATION AND DEBRIDEMENT LEFT FOOT/ANKLE;  Surgeon: Candelaria Stagers, DPM;  Location: [facility] OR;  Service: Podiatry;  Laterality: Left;   I & D EXTREMITY Left 03/23/2022   Procedure: IRRIGATION AND DEBRIDEMENT EXTREMITY;  Surgeon: Louann Sjogren, DPM;  Location: [facility] OR;  Service: Podiatry;  Laterality: Left;   JOINT REPLACEMENT     TONSILLECTOMY  1970's?   TOTAL HIP ARTHROPLASTY Right 12/13/2016   TOTAL HIP ARTHROPLASTY Right 12/13/2016   Procedure: RIGHT TOTAL HIP ARTHROPLASTY ANTERIOR APPROACH;  Surgeon: Kathryne Hitch, MD;  Location: [facility] OR;  Service: Orthopedics;  Laterality: Right;   Social History   Social History Narrative   Not on file   Immunization History  Administered Date(s) Administered   Influenza,inj,Quad PF,6+ Mos 08/08/2014   PFIZER(Purple Top)SARS-COV-2 Vaccination 10/11/2019,  11/01/2019   PPD Test 06/18/2018   Pneumococcal Polysaccharide-23 08/08/2014   Tdap 11/15/2016     Objective: Vital Signs: BP (!) 169/104 (BP Location: Left Arm, Patient Position: Sitting, Cuff Size: Normal)   Pulse 99   Resp 14   Ht 5' 10.5" (1.791 m)   Wt 161 lb (73 kg)   BMI 22.77 kg/m    Physical Exam HENT:     Mouth/Throat:     Mouth: Mucous membranes are moist.     Pharynx: Oropharynx is clear.  Eyes:     Conjunctiva/sclera: Conjunctivae normal.  Cardiovascular:     Rate and Rhythm: Normal rate and regular rhythm.  Pulmonary:     Effort: Pulmonary effort is normal.     Breath sounds: Normal breath sounds.  Musculoskeletal:     Right lower leg: No edema.     Left lower leg: No edema.  Lymphadenopathy:     Cervical: No cervical adenopathy.  Skin:    General: Skin is warm and dry.     Findings: No rash.  Neurological:     Mental Status: He is alert.  Psychiatric:        Mood and Affect: Mood normal.      Musculoskeletal Exam:  Shoulders ROM restricted with severe stiffness, passive ROM is intact, no focal tenderness to pressure or swelling Left elbow firm mobile nontender nodule in olecranon bursa Bilateral wrist swelling with dorsal nodules, ROM slightly restricted MCPs joint swelling throughout both hands, worse on right, grip ROM intact Firm cyst on radial side of right 2nd finger middle phalanx Knees full ROM no tenderness or swelling Left ankle tenderness, left MTPs squeeze painful   Investigation: No additional findings.  Imaging: XR Hand 2 View Left Result Date: 08/21/2023 X-ray left hand 2 views Radiocarpal joint space are preserved.  Some bone spurring at ulnar styloid and it radial ulnar joint.  Mild first CMC joint degenerative changes.  Mild subluxation at first MCP none in other digits.Marland Kitchen  PIP and DIP joints appear preserved.  No erosions or abnormal calcifications seen. Impression Mild proximal degenerative changes could be anticipated use  related versus nonerosive RA.  No significant appearing progression from x-ray in 11/2021.  XR Hand 2 View Right Result Date: 08/21/2023 X-ray right hand 2 views Radiocarpal joint space appears normal.  There is possible erosion changes at the ulnar styloid.  Moderately severe first CMC joint degenerative arthritis and multiple cystic changes present throughout carpal bones.  Slight MCP joint widening with small lateral osteophytes and subchondral cysts without subluxation or erosions.  PIP and DIP joints appear preserved. Impression Extensive osteoarthritis  changes in proximal joints, possible ulnar styloid erosion could be consistent with active RA.  No significant progression evident compared to previous hand x-ray from 11/2021  DG Ankle 2 Views Left Result Date: 07/24/2023 Please see detailed radiograph report in office note.  DG Foot Complete Left Result Date: 07/24/2023 Please see detailed radiograph report in office note.   Recent Labs: Lab Results  Component Value Date   WBC 8.9 07/11/2023   HGB 11.1 (L) 07/11/2023   PLT 506 (H) 07/11/2023   NA 134 07/11/2023   K 5.2 07/11/2023   CL 97 07/11/2023   CO2 20 07/11/2023   GLUCOSE 74 07/11/2023   BUN 9 07/11/2023   CREATININE 0.85 07/11/2023   BILITOT 0.3 07/11/2023   ALKPHOS 142 (H) 07/11/2023   AST 22 07/11/2023   ALT 15 07/11/2023   PROT 9.3 (H) 07/11/2023   ALBUMIN 3.7 (L) 07/11/2023   CALCIUM 9.2 07/11/2023   GFRAA >60 11/18/2019    Speciality Comments: No specialty comments available.  Procedures:  No procedures performed Allergies: Bee venom and Shellfish allergy   Assessment / Plan:     Visit Diagnoses: Rheumatoid arthritis involving multiple sites, unspecified whether rheumatoid factor present (HCC) - Plan: methotrexate (RHEUMATREX) 2.5 MG tablet, folic acid (FOLVITE) 1 MG tablet, predniSONE (DELTASONE) 10 MG tablet, XR Hand 2 View Right, XR Hand 2 View Left Severe, active disease with multiple joint involvement  and presence of rheumatoid nodules. Recent blood work showed high inflammatory markers. Patient has been off Methotrexate due to insurance issues and has been managing pain with Ibuprofen, which is insufficient. - Restart Methotrexate at 6 tablets weekly. - Start Prednisone at a higher dose for 1 week, then decrease to a lower dose for 1 week. - Order hand X-rays to assess for joint damage. - Schedule follow-up in 3-4 weeks to reassess response to treatment.  High risk medication use - Methotrexate at 15 mg p.o. weekly If results are okay can titrate methotrexate dose to 20 mg weekly continue folic acid daily. - Plan: DG Chest 2 View previously tolerated methotrexate without major complication came off due to lack of insurance and follow-up.  Recent labs from December reviewed with blood count and metabolic panel looked okay.  Does not have any recent chest imaging on file, an unrelated chest x-ray from 2021 was normal but have more concern to rule out changes given nodular disease in multiple areas. - Order chest X-ray to rule out pulmonary nodules associated with RA before continuing Methotrexate. Patient to complete this before next follow-up.   Rheumatoid nodule of left elbow (HCC)  Septic Arthritis (left ankle) History of two surgeries in July and August 2023. Persistent swelling, possibly related to RA at this point.  Plan to follow-up with Dr. Ardelle Anton but recommended to see how he responds with treating the overall RA first.  Orders: Orders Placed This Encounter  Procedures   XR Hand 2 View Right   XR Hand 2 View Left   DG Chest 2 View   Meds ordered this encounter  Medications   methotrexate (RHEUMATREX) 2.5 MG tablet    Sig: Take 6 tablets (15 mg total) by mouth once a week. Caution:Chemotherapy. Protect from light.    Dispense:  30 tablet    Refill:  1   folic acid (FOLVITE) 1 MG tablet    Sig: Take 1 tablet (1 mg total) by mouth daily.    Dispense:  90 tablet    Refill:  0  predniSONE (DELTASONE) 10 MG tablet    Sig: Take 2 tablets (20 mg total) by mouth daily with breakfast for 7 days, THEN 1 tablet (10 mg total) daily with breakfast for 7 days.    Dispense:  21 tablet    Refill:  0     Follow-Up Instructions: Return in 4 weeks (on 09/18/2023) for RA MTX restart f/u 66mo.   Fuller Plan, MD  Note - This record has been created using [employer].  Chart creation errors have been sought, but may not always  have been located. Such creation errors do not reflect on  the standard of medical care.

## 2023-08-09 ENCOUNTER — Other Ambulatory Visit: Payer: Self-pay

## 2023-08-14 ENCOUNTER — Ambulatory Visit (INDEPENDENT_AMBULATORY_CARE_PROVIDER_SITE_OTHER): Payer: Medicaid Other

## 2023-08-15 ENCOUNTER — Ambulatory Visit (INDEPENDENT_AMBULATORY_CARE_PROVIDER_SITE_OTHER): Payer: Medicaid Other

## 2023-08-17 ENCOUNTER — Ambulatory Visit (INDEPENDENT_AMBULATORY_CARE_PROVIDER_SITE_OTHER): Payer: Medicaid Other

## 2023-08-17 ENCOUNTER — Telehealth: Payer: Self-pay | Admitting: Primary Care

## 2023-08-17 NOTE — Telephone Encounter (Signed)
Returned pt call and reschedule bp check for Tuesday 08/22/23

## 2023-08-17 NOTE — Telephone Encounter (Signed)
Copied from CRM [id number]. Topic: Appointments - Appointment Cancel/Reschedule >> Aug 17, 2023  8:30 AM Franchot Heidelberg wrote: Patient/patient representative is calling to cancel or reschedule an appointment. Refer to attachments for appointment information.

## 2023-08-21 ENCOUNTER — Ambulatory Visit: Payer: Medicaid Other | Attending: Internal Medicine | Admitting: Internal Medicine

## 2023-08-21 ENCOUNTER — Ambulatory Visit (INDEPENDENT_AMBULATORY_CARE_PROVIDER_SITE_OTHER): Payer: Medicaid Other

## 2023-08-21 ENCOUNTER — Other Ambulatory Visit: Payer: Self-pay

## 2023-08-21 ENCOUNTER — Ambulatory Visit: Payer: Medicaid Other

## 2023-08-21 ENCOUNTER — Encounter: Payer: Self-pay | Admitting: Internal Medicine

## 2023-08-21 VITALS — BP 169/104 | HR 99 | Resp 14 | Ht 70.5 in | Wt 161.0 lb

## 2023-08-21 DIAGNOSIS — M06322 Rheumatoid nodule, left elbow: Secondary | ICD-10-CM

## 2023-08-21 DIAGNOSIS — M069 Rheumatoid arthritis, unspecified: Secondary | ICD-10-CM

## 2023-08-21 DIAGNOSIS — Z79899 Other long term (current) drug therapy: Secondary | ICD-10-CM | POA: Diagnosis not present

## 2023-08-21 DIAGNOSIS — M79641 Pain in right hand: Secondary | ICD-10-CM | POA: Diagnosis not present

## 2023-08-21 DIAGNOSIS — M25572 Pain in left ankle and joints of left foot: Secondary | ICD-10-CM | POA: Diagnosis not present

## 2023-08-21 DIAGNOSIS — M79642 Pain in left hand: Secondary | ICD-10-CM | POA: Diagnosis not present

## 2023-08-21 MED ORDER — PREDNISONE 10 MG PO TABS
ORAL_TABLET | ORAL | 0 refills | Status: AC
  Filled 2023-08-21: qty 21, 14d supply, fill #0

## 2023-08-21 MED ORDER — METHOTREXATE SODIUM 2.5 MG PO TABS
15.0000 mg | ORAL_TABLET | ORAL | 1 refills | Status: DC
  Filled 2023-08-21: qty 30, 35d supply, fill #0
  Filled 2023-09-22: qty 30, 35d supply, fill #1

## 2023-08-21 MED ORDER — FOLIC ACID 1 MG PO TABS
1.0000 mg | ORAL_TABLET | Freq: Every day | ORAL | 0 refills | Status: DC
  Filled 2023-08-21: qty 90, 90d supply, fill #0

## 2023-08-21 NOTE — Patient Instructions (Signed)
Methotrexate Tablets What is this medication? METHOTREXATE (METH oh TREX ate) treats autoimmune conditions, such as arthritis and psoriasis. It works by decreasing inflammation, which can reduce pain and prevent long-term injury to the joints and skin. It may also be used to treat some types of cancer. It works by slowing down the growth of cancer cells. This medicine may be used for other purposes; ask your health care provider or pharmacist if you have questions. COMMON BRAND NAME(S): Rheumatrex, Trexall What should I tell my care team before I take this medication? They need to know if you have any of these conditions: Dehydration Diabetes Fluid in the stomach area or lungs Frequently drink alcohol Having surgery, including dental surgery High cholesterol Immune system problems Inflammatory bowel disease, such as ulcerative colitis Kidney disease Liver disease Low blood cell levels (white cells, red cells, and platelets) Lung disease Recent or ongoing radiation Recent or upcoming vaccine Stomach ulcers, other stomach or intestine problems An unusual or allergic reaction to methotrexate, other medications, foods, dyes, or preservatives Pregnant or trying to get pregnant Breastfeeding How should I use this medication? Take this medication by mouth with water. Take it as directed on the prescription label. Do not take extra. Keep taking this medication until your care team tells you to stop. Know why you are taking this medication and how you should take it. To treat conditions such as arthritis and psoriasis, this medication is taken ONCE A WEEK as a single dose or divided into 3 smaller doses taken 12 hours apart (do not take more than 3 doses 12 hours apart each week). This medication is NEVER taken daily to treat conditions other than cancer. Taking this medication more often than directed can cause serious side effects, even death. Talk to your care team about why you are taking this  medication, how often you will take it, and what your dose is. Ask your care team to put the reason you take this medication on the prescription. If you take this medication ONCE A WEEK, choose a day of the week before you start. Ask your pharmacist to include the day of the week on the label. Avoid "Monday", which could be misread as "Morning". Handling this medication may be harmful. Talk to your care team about how to handle this medication. Special instructions may apply. Talk to your care team about the use of this medication in children. While it may be prescribed for selected conditions, precautions do apply. Overdosage: If you think you have taken too much of this medicine contact a poison control center or emergency room at once. NOTE: This medicine is only for you. Do not share this medicine with others. What if I miss a dose? If you miss a dose, talk with your care team. Do not take double or extra doses. What may interact with this medication? Do not take this medication with any of the following: Acitretin Live virus vaccines Probenecid This medication may also interact with the following: Alcohol Aspirin and aspirin-like medications Certain antibiotics, such as penicillin, neomycin, sulfamethoxazole; trimethoprim Certain medications for stomach problems, such as lansoprazole, omeprazole, pantoprazole Clozapine Cyclosporine Dapsone Folic acid [place]et NSAIDs, medications for pain and inflammation, such as ibuprofen or naproxen Phenytoin Pyrimethamine Steroid medications, such as prednisone or cortisone Tacrolimus Theophylline This list may not describe all possible interactions. Give your health care provider a list of all the medicines, herbs, non-prescription drugs, or dietary supplements you use. Also tell them if you smoke, drink alcohol, or use  illegal drugs. Some items may interact with your medicine. What should I watch for while using this medication? Visit your  care team for regular checks on your progress. It may be some time before you see the benefit from this medication. You may need blood work done while you are taking this medication. If your care team has also prescribed folic acid, they may instruct you to skip your folic acid dose on the day you take methotrexate. This medication can make you more sensitive to the sun. Keep out of the sun. If you cannot avoid being in the sun, wear protective clothing and sunscreen. Do not use sun lamps, tanning beds, or tanning booths. Check with your care team if you have severe diarrhea, nausea, and vomiting, or if you sweat a lot. The loss of too much body fluid may make it dangerous for you to take this medication. This medication may increase your risk of getting an infection. Call your care team for advice if you get a fever, chills, sore throat, or other symptoms of a cold or flu. Do not treat yourself. Try to avoid being around people who are sick. Talk to your care team about your risk of cancer. You may be more at risk for certain types of cancers if you take this medication. Talk to your care team if you or your partner may be pregnant. Serious birth defects can occur if you take this medication during pregnancy and for 6 months after the last dose. You will need a negative pregnancy test before starting this medication. Contraception is recommended while taking this medication and for 6 months after the last dose. Your care team can help you find the option that works for you. If your partner can get pregnant, use a condom during sex while taking this medication and for 3 months after the last dose. Do not breastfeed while taking this medication and for 1 week after the last dose. This medication may cause infertility. Talk to your care team if you are concerned about your fertility. What side effects may I notice from receiving this medication? Side effects that you should report to your care team as soon  as possible: Allergic reactions--skin rash, itching, hives, swelling of the face, lips, tongue, or throat Dry cough, shortness of breath or trouble breathing Infection--fever, chills, cough, sore throat, wounds that don't heal, pain or trouble when passing urine, general feeling of discomfort or being unwell Kidney injury--decrease in the amount of urine, swelling of the ankles, hands, or feet Liver injury--right upper belly pain, loss of appetite, nausea, light-colored stool, dark yellow or brown urine, yellowing skin or eyes, unusual weakness or fatigue Low red blood cell level--unusual weakness or fatigue, dizziness, headache, trouble breathing Pain, tingling, or numbness in the hands or feet, muscle weakness, change in vision, confusion or trouble speaking, loss of balance or coordination, trouble walking, seizures Redness, blistering, peeling, or loosening of the skin, including inside the mouth Stomach bleeding--bloody or black, tar-like stools, vomiting blood or brown material that looks like coffee grounds Stomach pain that is severe, does not go away, or gets worse Unusual bruising or bleeding Side effects that usually do not require medical attention (report these to your care team if they continue or are bothersome): Diarrhea Dizziness Hair loss Nausea Pain, redness, or swelling with sores inside the mouth or throat Skin reactions on sun-exposed areas Vomiting This list may not describe all possible side effects. Call your doctor for medical advice about side effects.  You may report side effects to FDA at 1-800-FDA-1088. Where should I keep my medication? Keep out of the reach of children and pets. Store at room temperature between 20 and 25 degrees C (68 and 77 degrees F). Protect from light. Keep the container tightly closed. Get rid of any unused medication after the expiration date. To get rid of medications that are no longer needed or have expired: Take the medication to a  medication take-back program. Check with your pharmacy or law enforcement to find a location. If you cannot return the medication, ask your pharmacist or care team how to get rid of this medication safely. NOTE: This sheet is a summary. It may not cover all possible information. If you have questions about this medicine, talk to your doctor, pharmacist, or health care provider.  2024 Elsevier/Gold Standard (2023-06-23 00:00:00)

## 2023-08-22 ENCOUNTER — Ambulatory Visit (INDEPENDENT_AMBULATORY_CARE_PROVIDER_SITE_OTHER): Payer: Medicaid Other

## 2023-08-25 ENCOUNTER — Other Ambulatory Visit: Payer: Self-pay

## 2023-08-26 DIAGNOSIS — Z419 Encounter for procedure for purposes other than remedying health state, unspecified: Secondary | ICD-10-CM | POA: Diagnosis not present

## 2023-08-28 ENCOUNTER — Other Ambulatory Visit: Payer: Self-pay

## 2023-09-05 ENCOUNTER — Other Ambulatory Visit: Payer: Self-pay

## 2023-09-11 ENCOUNTER — Other Ambulatory Visit: Payer: Self-pay

## 2023-09-14 NOTE — Progress Notes (Deleted)
 Office Visit Note  Patient: Dennis Harris             Date of Birth: 1965/10/31           MRN: [id number]             PCP: [name], NP Referring: [name], NP Visit Date: 09/28/2023   Subjective:  No chief complaint on file.   History of Present Illness: Dennis Harris is a 58 y.o. male here for follow up for RA    Previous HPI 08/21/2023 Dennis Harris is a 58 y.o. male here for follow up for RA currently off treatment due to a long interval after needing surgery for left ankle septic joint and subsequently was without health insurance until establishing medicaid last November. He presents with worsening joint pain and swelling. They report that their symptoms have been progressively worsening, affecting their mobility and quality of life. The patient describes the pain as intense and affecting multiple joints, including the shoulders, hands, and ankles. They also report morning stiffness, requiring them to take small steps and lean against the wall for support upon waking.   The patient has a history of septic arthritis in the left ankle, which required two surgeries in 2023. Despite the surgeries, the patient reports persistent swelling in the ankle. They also report the development of nodules on their right index finger and elbow, which they initially believed to be cysts.   The patient has been off methotrexate, their previous RA medication, due to issues with Medicaid. They report a significant increase in symptoms since discontinuing the medication. They have been managing their symptoms with ibuprofen, but report that it is no longer effective. They also report a brief period of symptom relief after a short course of prednisone prescribed by their primary care provider.     Previous HPI 12/28/2021 Dennis Harris is a 58 y.o. male here for follow up for  history of RA after starting methotrexate 15 mg PO weekly. He feels his joint pain  and swelling are improved in most areas but the left ankle is not getting any better. He took prednisone initially but even while on this his ankle did not decrease swelling noticeably. 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   11/22/2021 Dennis Harris is a 58 y.o. male here for joint pains he has history of RA treated with methotrexate and recent ED visits due to flare ups treated with prednisone.  He was diagnosed sometime around 2007 to 2009 with rheumatoid arthritis due to joint inflammation in multiple areas.  He was on oral methotrexate for years for treatment which had been effective.  Also treated episodically with steroids for active inflammation which was generally beneficial and no major side effects.  Most recent other rheumatology provider with Dr. Nickola Major but has not seen her for more than a year and due to loss of insurance when he lost his previous employment.  As result he has been off any maintenance treatment throughout last year.  He also has a history of degenerative arthritis and AVN and had anterior cervical spine fusion and right hip replacement. He developed skin rashes on his chest years ago saw dermatology a decade or more ago was not thought to represent any significant ongoing process.  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  Labs reviewed 05/2018 ANA neg RF 168 CCP >250   No Rheumatology ROS completed.   PMFS History:  Patient Active Problem List   Diagnosis Date Noted   Septic arthritis (HCC) 03/22/2022   Osteomyelitis (HCC) 03/22/2022   HFrEF (heart failure with reduced ejection fraction) (HCC) 03/22/2022   AKI (acute kidney injury) (HCC) 02/04/2022   Hypoalbuminemia 02/04/2022   Microcytic anemia 02/02/2022   Thrombocytosis 02/02/2022   Hyponatremia 02/02/2022   Septic arthritis of ankle (HCC) 01/31/2022    Hypertension    Pain in left ankle and joints of left foot 12/28/2021   High risk medication use 11/22/2021   Rheumatoid nodule of left elbow (HCC) 11/22/2021   Hyperlipidemia 11/29/2019   Tobacco abuse 11/29/2019   Avascular necrosis of hip, right (HCC) 12/13/2016   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   Avascular necrosis of bones of both hips (HCC) 12/07/2016   Pain of left hip joint 12/07/2016   Pain of right hip joint 12/07/2016   SIRS (systemic inflammatory response syndrome) (HCC) 08/07/2014   Cardiomyopathy- EF 40-45% presumed secondary to acute illness 08/07/2014   CAP (community acquired pneumonia)    Right upper quadrant pain    Acute pericarditis 08/04/2014   Acute hyponatremia 08/04/2014   Protein-calorie malnutrition, severe (HCC) 08/04/2014   Sepsis (HCC) 08/03/2014   Legionella pneumonia (HCC) 08/03/2014   Rheumatoid arthritis (HCC) 08/03/2014   Chest pain 08/03/2014   Elevated troponin- not fel to be secondary to MI 08/03/2014   Nausea vomiting and diarrhea 08/03/2014   Cervical spondylosis without myelopathy 11/06/2012    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   Exertional shortness of breath    "at times" (11/14/2012)   Hypercholesteremia    Hypertension    Pericarditis ~ 2009   Pneumonia    Rheumatoid arthritis(714.0)    Sepsis (HCC)    in the left ankle    Family History  Problem Relation Age of Onset   Cancer Mother    Hypertension Mother    Stroke Mother    Diabetes Mother    Cirrhosis Father    Lupus Sister    Lupus Sister    Breast cancer Sister    Past Surgical History:  Procedure Laterality Date   ANTERIOR CERVICAL DECOMP/DISCECTOMY FUSION N/A 11/14/2012   Procedure: ANTERIOR CERVICAL DECOMPRESSION/DISCECTOMY FUSION 1 LEVEL C5-6;  Surgeon: Venita Lick, MD;  Location: [facility] OR;  Service: Orthopedics;  Laterality: N/A;   APPENDECTOMY  1970's   BACK SURGERY     CARDIAC  CATHETERIZATION  May 2010   normal   I & D EXTREMITY Left 02/02/2022   Procedure: IRRIGATION AND DEBRIDEMENT LEFT FOOT/ANKLE;  Surgeon: Candelaria Stagers, DPM;  Location: [facility] OR;  Service: Podiatry;  Laterality: Left;   I & D EXTREMITY Left 03/23/2022   Procedure: IRRIGATION AND DEBRIDEMENT EXTREMITY;  Surgeon: Louann Sjogren, DPM;  Location: [facility] OR;  Service: Podiatry;  Laterality: Left;   JOINT REPLACEMENT     TONSILLECTOMY  1970's?   TOTAL HIP ARTHROPLASTY Right 12/13/2016   TOTAL HIP ARTHROPLASTY Right 12/13/2016   Procedure: RIGHT TOTAL HIP ARTHROPLASTY ANTERIOR APPROACH;  Surgeon: Kathryne Hitch, MD;  Location: [facility] OR;  Service: Orthopedics;  Laterality: Right;   Social History   Social History Narrative   Not on file   Immunization History  Administered Date(s) Administered   Influenza,inj,Quad PF,6+ Mos 08/08/2014   PFIZER(Purple Top)SARS-COV-2 Vaccination 10/11/2019,  11/01/2019   PPD Test 06/18/2018   Pneumococcal Polysaccharide-23 08/08/2014   Tdap 11/15/2016     Objective: Vital Signs: There were no vitals taken for this visit.   Physical Exam   Musculoskeletal Exam: ***  CDAI Exam: CDAI Score: -- Patient Global: --; Provider Global: -- Swollen: --; Tender: -- Joint Exam 09/28/2023   No joint exam has been documented for this visit   There is currently no information documented on the homunculus. Go to the Rheumatology activity and complete the homunculus joint exam.  Investigation: No additional findings.  Imaging: XR Hand 2 View Left Result Date: 08/21/2023 X-ray left hand 2 views Radiocarpal joint space are preserved.  Some bone spurring at ulnar styloid and it radial ulnar joint.  Mild first CMC joint degenerative changes.  Mild subluxation at first MCP none in other digits.Marland Kitchen  PIP and DIP joints appear preserved.  No erosions or abnormal calcifications seen. Impression Mild proximal degenerative changes could be anticipated use related versus nonerosive  RA.  No significant appearing progression from x-ray in 11/2021.  XR Hand 2 View Right Result Date: 08/21/2023 X-ray right hand 2 views Radiocarpal joint space appears normal.  There is possible erosion changes at the ulnar styloid.  Moderately severe first CMC joint degenerative arthritis and multiple cystic changes present throughout carpal bones.  Slight MCP joint widening with small lateral osteophytes and subchondral cysts without subluxation or erosions.  PIP and DIP joints appear preserved. 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  No significant progression evident compared to previous hand x-ray from 11/2021   Recent Labs: Lab Results  Component Value Date   WBC 8.9 07/11/2023   HGB 11.1 (L) 07/11/2023   PLT 506 (H) 07/11/2023   NA 134 07/11/2023   K 5.2 07/11/2023   CL 97 07/11/2023   CO2 20 07/11/2023   GLUCOSE 74 07/11/2023   BUN 9 07/11/2023   CREATININE 0.85 07/11/2023   BILITOT 0.3 07/11/2023   ALKPHOS 142 (H) 07/11/2023   AST 22 07/11/2023   ALT 15 07/11/2023   PROT 9.3 (H) 07/11/2023   ALBUMIN 3.7 (L) 07/11/2023   CALCIUM 9.2 07/11/2023   GFRAA >60 11/18/2019    Speciality Comments: No specialty comments available.  Procedures:  No procedures performed Allergies: Bee venom and Shellfish allergy   Assessment / Plan:     Visit Diagnoses: No diagnosis found.  ***  Orders: No orders of the defined types were placed in this encounter.  No orders of the defined types were placed in this encounter.    Follow-Up Instructions: No follow-ups on file.   Metta Clines, RT  Note - This record has been created using [employer].  Chart creation errors have been sought, but may not always  have been located. Such creation errors do not reflect on  the standard of medical care.

## 2023-09-22 ENCOUNTER — Other Ambulatory Visit: Payer: Self-pay

## 2023-09-22 ENCOUNTER — Ambulatory Visit: Payer: Medicaid Other | Admitting: Podiatry

## 2023-09-23 DIAGNOSIS — Z419 Encounter for procedure for purposes other than remedying health state, unspecified: Secondary | ICD-10-CM | POA: Diagnosis not present

## 2023-09-28 ENCOUNTER — Ambulatory Visit: Payer: Medicaid Other | Admitting: Internal Medicine

## 2023-09-28 DIAGNOSIS — Z79899 Other long term (current) drug therapy: Secondary | ICD-10-CM

## 2023-09-28 DIAGNOSIS — M06322 Rheumatoid nodule, left elbow: Secondary | ICD-10-CM

## 2023-09-28 DIAGNOSIS — M069 Rheumatoid arthritis, unspecified: Secondary | ICD-10-CM

## 2023-10-03 ENCOUNTER — Ambulatory Visit: Payer: Medicaid Other | Admitting: Podiatry

## 2023-10-09 ENCOUNTER — Encounter: Payer: Self-pay | Admitting: Primary Care

## 2023-10-16 ENCOUNTER — Encounter: Payer: Self-pay | Admitting: Podiatry

## 2023-10-16 ENCOUNTER — Ambulatory Visit (INDEPENDENT_AMBULATORY_CARE_PROVIDER_SITE_OTHER): Admitting: Podiatry

## 2023-10-16 ENCOUNTER — Ambulatory Visit (INDEPENDENT_AMBULATORY_CARE_PROVIDER_SITE_OTHER)

## 2023-10-16 DIAGNOSIS — M7752 Other enthesopathy of left foot: Secondary | ICD-10-CM

## 2023-10-16 DIAGNOSIS — M25572 Pain in left ankle and joints of left foot: Secondary | ICD-10-CM

## 2023-10-16 DIAGNOSIS — M00072 Staphylococcal arthritis, left ankle and foot: Secondary | ICD-10-CM

## 2023-10-16 NOTE — Progress Notes (Signed)
  Subjective:  Patient ID: Dennis Harris, male    DOB: [birth year],  MRN: [id number]  Chief Complaint  Patient presents with   Ankle Pain    RM#14 Follow up on left ankle pain patient states his foot and ankle swells and goes down comes and goes. Pain still present at times not sure what to do as it has been over a year now.     Discussed the use of AI scribe software for clinical note transcription with the patient, who gave verbal consent to proceed.  History of Present Illness The patient, with a history of rheumatoid arthritis, presents with persistent ankle pain and swelling. He has been back on methotrexate for about two months, but he does not believe it is working. He plans to discuss this with his rheumatologist, Dr. Dimple Casey, at his next appointment. The patient is able to move his ankle up and down with mild discomfort, but there is no pain on palpation. He also has a history of a ruptured disc in the neck and a hip replacement. The patient is currently unemployed and is struggling to find suitable work due to his physical limitations.      Objective:    Physical Exam   General: AAO x3, NAD  Dermatological: Skin is warm, dry and supple bilateral. There are no open sores, no preulcerative lesions, no rash or signs of infection present.  Vascular: Dorsalis Pedis artery and Posterior Tibial artery pedal pulses are 2/4 bilateral with immedate capillary fill time. There is no pain with calf compression, swelling, warmth, erythema.   Neruologic: Grossly intact via light touch bilateral.    Musculoskeletal: Unable to appreciate any area pinpoint tenderness. There is mild edema to the ankle but there is no erythema or warmth.  Some pain with range of motion of the ankle joint there is no crepitation or restriction otherwise.  Proximal, extensor tendons appear to be intact.  Gait: Unassisted, Nonantalgic.   Results RADIOLOGY Left ankle X-ray: Radiolucencies along the medial  malleolus, no cortical changes suggest osteomyelitis, no soft tissue emphysema (10/16/2023)   Assessment:   1. Staphylococcal arthritis of left ankle (HCC)   2. Capsulitis of ankle, left      Plan:  Patient was evaluated and treated and all questions answered.  Assessment and Plan Assessment & Plan Left ankle pain and swelling Intermittent pain and swelling possibly due to rheumatoid arthritis or residual infection.  - Order MRI of the left ankle to assess for residual infection or other abnormalities. - Reorder blood work as he did not get this done in December.  Rheumatoid arthritis On methotrexate for 1.5 to 2 months; reports possible ineffectiveness. Follow-up with rheumatologist Dr. Dimple Casey planned.  Work limitations due to pain Unable to return to work due to pain; seeking alternative employment. Requires documentation for Medicaid and food stamps. - Provide a doctor's note stating he is still out of work and the duration is uncertain.  Vivi Barrack DPM       No follow-ups on file.

## 2023-10-19 ENCOUNTER — Encounter: Payer: Self-pay | Admitting: Internal Medicine

## 2023-10-19 ENCOUNTER — Ambulatory Visit (INDEPENDENT_AMBULATORY_CARE_PROVIDER_SITE_OTHER): Payer: Self-pay | Admitting: Primary Care

## 2023-10-19 ENCOUNTER — Ambulatory Visit: Attending: Internal Medicine | Admitting: Internal Medicine

## 2023-10-19 ENCOUNTER — Encounter (INDEPENDENT_AMBULATORY_CARE_PROVIDER_SITE_OTHER): Payer: Self-pay | Admitting: Primary Care

## 2023-10-19 ENCOUNTER — Other Ambulatory Visit: Payer: Self-pay

## 2023-10-19 VITALS — BP 158/101 | HR 93 | Wt 158.4 lb

## 2023-10-19 VITALS — BP 137/82 | HR 88 | Resp 14 | Ht 70.5 in | Wt 157.0 lb

## 2023-10-19 DIAGNOSIS — M05772 Rheumatoid arthritis with rheumatoid factor of left ankle and foot without organ or systems involvement: Secondary | ICD-10-CM | POA: Diagnosis not present

## 2023-10-19 DIAGNOSIS — M87051 Idiopathic aseptic necrosis of right femur: Secondary | ICD-10-CM | POA: Diagnosis not present

## 2023-10-19 DIAGNOSIS — M069 Rheumatoid arthritis, unspecified: Secondary | ICD-10-CM

## 2023-10-19 DIAGNOSIS — M0579 Rheumatoid arthritis with rheumatoid factor of multiple sites without organ or systems involvement: Secondary | ICD-10-CM | POA: Diagnosis not present

## 2023-10-19 DIAGNOSIS — Z760 Encounter for issue of repeat prescription: Secondary | ICD-10-CM | POA: Diagnosis not present

## 2023-10-19 DIAGNOSIS — M87052 Idiopathic aseptic necrosis of left femur: Secondary | ICD-10-CM | POA: Diagnosis not present

## 2023-10-19 DIAGNOSIS — Z79899 Other long term (current) drug therapy: Secondary | ICD-10-CM

## 2023-10-19 DIAGNOSIS — M06322 Rheumatoid nodule, left elbow: Secondary | ICD-10-CM | POA: Diagnosis not present

## 2023-10-19 DIAGNOSIS — I5020 Unspecified systolic (congestive) heart failure: Secondary | ICD-10-CM | POA: Diagnosis not present

## 2023-10-19 DIAGNOSIS — M009 Pyogenic arthritis, unspecified: Secondary | ICD-10-CM | POA: Diagnosis not present

## 2023-10-19 DIAGNOSIS — I10 Essential (primary) hypertension: Secondary | ICD-10-CM | POA: Diagnosis not present

## 2023-10-19 MED ORDER — PREDNISONE 10 MG PO TABS
ORAL_TABLET | ORAL | 0 refills | Status: AC
  Filled 2023-10-19: qty 21, 14d supply, fill #0

## 2023-10-19 MED ORDER — METHOTREXATE SODIUM 2.5 MG PO TABS
25.0000 mg | ORAL_TABLET | ORAL | 0 refills | Status: DC
  Filled 2023-10-19: qty 50, 35d supply, fill #0

## 2023-10-19 MED ORDER — LOSARTAN POTASSIUM 25 MG PO TABS
25.0000 mg | ORAL_TABLET | Freq: Every day | ORAL | 1 refills | Status: DC
  Filled 2023-10-19: qty 90, 90d supply, fill #0

## 2023-10-19 MED ORDER — AMLODIPINE BESYLATE 10 MG PO TABS
10.0000 mg | ORAL_TABLET | Freq: Every day | ORAL | 1 refills | Status: DC
  Filled 2023-10-19: qty 90, 90d supply, fill #0

## 2023-10-19 NOTE — Progress Notes (Signed)
 [facility]  Dennis Harris, is a 58 y.o. male  ZOX:[id number]  [id number]:[id number]  DOB - 1965/12/10  Chief Complaint  Patient presents with   Medical Management of Chronic Issues       Subjective:   Dennis Harris is a 58 y.o. male here today for acute visit.  Rheumatoid arthritis flareup.  Reached out to the rheumatologist appointment not available until May.Thought was to placed on prednisone.  Dr. Sheliah Hatch rheumatologist responded and said he will be able to work patient in today.  Have him come to the office.  Patient given information.   However patient was AppointmentPatient has No headache, No chest pain, No abdominal pain - No Nausea, No new weakness tingling or numbness, No Cough - shortness of breath HPI  No problems updated.  Comprehensive ROS Pertinent positive and negative noted in HPI   Allergies  Allergen Reactions   Bee Venom Anaphylaxis   Shellfish Allergy Anaphylaxis and Hives    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   Exertional shortness of breath    "at times" (11/14/2012)   Hypercholesteremia    Hypertension    Pericarditis ~ 2009   Pneumonia    Rheumatoid arthritis(714.0)    Sepsis (HCC)    in the left ankle    Current Outpatient Medications on File Prior to Visit  Medication Sig Dispense Refill   atorvastatin (LIPITOR) 40 MG tablet Take 1 tablet (40 mg total) by mouth daily. (Patient not taking: Reported on 10/19/2023) 90 tablet 1   folic acid (FOLVITE) 1 MG tablet Take 1 tablet (1 mg total) by mouth daily. 90 tablet 0   ibuprofen (ADVIL) 800 MG tablet Take 1 tablet (800 mg total) by mouth every 8 (eight) hours as needed. 90 tablet 1   No current facility-administered medications on file prior to visit.   Health Maintenance  Topic Date Due   Zoster (Shingles) Vaccine (1 of 2) Never done   Colon Cancer Screening  Never done   Pneumococcal Vaccination (2 of 2 -  PCV) 08/09/2015   COVID-19 Vaccine (3 - Pfizer risk series) 11/29/2019   Flu Shot  10/23/2023*   DTaP/Tdap/Td vaccine (2 - Td or Tdap) 11/16/2026   Hepatitis C Screening  Completed   HIV Screening  Completed   HPV Vaccine  Aged Out  *Topic was postponed. The date shown is not the original due date.    Objective:   Vitals:   10/19/23 0936 10/19/23 1057  BP: (!) 152/79 (!) 158/101  Pulse: 93   SpO2: 100%   Weight: 158 lb 6.4 oz (71.8 kg)      Physical Exam Vitals reviewed.  Constitutional:      Comments: thin  HENT:     Head: Normocephalic.     Right Ear: Tympanic membrane and external ear normal.     Left Ear: Tympanic membrane and external ear normal.     Nose: Nose normal.  Eyes:     Extraocular Movements: Extraocular movements intact.     Pupils: Pupils are equal, round, and reactive to light.  Cardiovascular:     Rate and Rhythm: Normal rate and regular rhythm.  Pulmonary:     Effort: Pulmonary effort is normal.     Breath sounds: Normal breath sounds.  Abdominal:     General: Bowel sounds are normal. There is distension.     Palpations: Abdomen is soft.  Musculoskeletal:  General: Deformity present.     Comments: See picture  Skin:    General: Skin is warm and dry.  Neurological:     Mental Status: He is oriented to person, place, and time.  Psychiatric:        Mood and Affect: Mood normal.        Behavior: Behavior normal.        Thought Content: Thought content normal.        Judgment: Judgment normal.    Assessment & Plan   Dennis Harris was seen today for medical management of chronic issues.  Diagnoses and all orders for this visit:  Rheumatoid arthritis involving left ankle with positive rheumatoid factor (HCC)   Medication refill 2/2Other orders -     amLODipine (NORVASC) 10 MG tablet; Take 1 tablet (10 mg total) by mouth daily. -     losartan (COZAAR) 25 MG tablet; Take 1 tablet (25 mg total) by mouth daily.  Hypertension, unspecified  type Elevated pain maybe contributing factor  BP goal - < 130/80 Explained that having normal blood pressure is the goal and medications are helping to get to goal and maintain normal blood pressure. DIET: Limit salt intake, read nutrition labels to check salt content, limit fried and high fatty foods  Avoid using multisymptom OTC cold preparations that generally contain sudafed which can rise BP. Consult with pharmacist on best cold relief products to use for persons with HTN EXERCISE Discussed incorporating exercise such as walking - 30 minutes most days of the week and can do in 10 minute intervals    Patient have been counseled extensively about nutrition and exercise. Other issues discussed during this visit include: low cholesterol diet, weight control and daily exercise, foot care, annual eye examinations at Ophthalmology, importance of adherence with medications and regular follow-up. We also discussed long term complications of uncontrolled diabetes and hypertension.     The patient was given clear instructions to go to ER or return to medical center if symptoms don't improve, worsen or new problems develop. The patient verbalized understanding. The patient was told to call to get lab results if they haven't heard anything in the next week.   This note has been created with Education officer, environmental. Any transcriptional errors are unintentional.   [name], NP 10/19/2023, 3:53 PM

## 2023-10-19 NOTE — Progress Notes (Signed)
 Office Visit Note  Patient: Dennis Harris             Date of Birth: May 01, 1966           MRN: [id number]             PCP: [name], NP Referring: [name], NP Visit Date: 10/19/2023   Subjective:  Follow-up (Patient states he has swelling in his hands, wrists, fingers, shoulders, knees, and feet. Patient states Monday he went back to the foot doctor and was told they thought it was related to the arthritis. )   Discussed the use of AI scribe software for clinical note transcription with the patient, who gave verbal consent to proceed.  History of Present Illness   Dennis Harris is a 58 year old male with rheumatoid arthritis who presents with persistent joint pain and swelling despite currently taking methotrexate 15 mg p.o. weekly and folic acid 1 mg daily.  After our previous visit he took the prednisone for 2 weeks with a good improvement in symptoms but started noticing pain and stiffness worsening again within about 1 week after completing this taper.  He experiences persistent joint pain and swelling, particularly in the wrists, shoulders, and hands. Despite being on methotrexate for nearly two months, his symptoms have not improved. He initially found relief with prednisone, but symptoms returned about a week after discontinuation.  He is currently taking methotrexate. Swelling is primarily in the wrists, with the right wrist being notably affected. He uses lidocaine patches for pain management, which provide some relief. He also reports a cyst on his right index finger and nodules on his left elbow, which are not tender.  He describes significant discomfort, particularly in the mornings, stating it takes him a few minutes to move upon waking. He also experiences difficulty getting comfortable in bed due to pain, especially in his wrists and shoulders.  He recently visited his foot doctor, who noted swelling in his foot, likely related to  arthritis. An MRI is scheduled to rule out any lingering infection from a previous surgery. He experiences some soreness in his ankle and foot area. No significant swelling in the elbow. Extensive swelling in the wrist and some soreness in the ankle and foot area.        Previous HPI 08/21/23 Dennis Harris is a 58 y.o. male here for follow up for RA currently off treatment due to a long interval after needing surgery for left ankle septic joint and subsequently was without health insurance until establishing medicaid last November. He presents with worsening joint pain and swelling. They report that their symptoms have been progressively worsening, affecting their mobility and quality of life. The patient describes the pain as intense and affecting multiple joints, including the shoulders, hands, and ankles. They also report morning stiffness, requiring them to take small steps and lean against the wall for support upon waking.   The patient has a history of septic arthritis in the left ankle, which required two surgeries in 2023. Despite the surgeries, the patient reports persistent swelling in the ankle. They also report the development of nodules on their right index finger and elbow, which they initially believed to be cysts.   The patient has been off methotrexate, their previous RA medication, due to issues with Medicaid. They report a significant increase in symptoms since discontinuing the medication. They have been managing their symptoms with ibuprofen, but report that it is no longer effective. They  also report a brief period of symptom relief after a short course of prednisone prescribed by their primary care provider.  12/28/2021 Dennis Harris is a 58 y.o. male here for follow up for  history of RA after starting methotrexate 15 mg PO weekly. He feels his joint pain and swelling are improved in most areas but the left ankle is not getting any better. He took prednisone initially  but even while on this his ankle did not decrease swelling noticeably. 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   11/22/2021 Dennis Harris is a 58 y.o. male here for joint pains he has history of RA treated with methotrexate and recent ED visits due to flare ups treated with prednisone.  He was diagnosed sometime around 2007 to 2009 with rheumatoid arthritis due to joint inflammation in multiple areas.  He was on oral methotrexate for years for treatment which had been effective.  Also treated episodically with steroids for active inflammation which was generally beneficial and no major side effects.  Most recent other rheumatology provider with Dr. Nickola Major but has not seen her for more than a year and due to loss of insurance when he lost his previous employment.  As result he has been off any maintenance treatment throughout last year.  He also has a history of degenerative arthritis and AVN and had anterior cervical spine fusion and right hip replacement. He developed skin rashes on his chest years ago saw dermatology a decade or more ago was not thought to represent any significant ongoing process.  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  Labs reviewed 11/2021 HBV neg HCV neg  05/2018 ANA neg RF 168 CCP >250   Review of Systems  Constitutional:  Negative for fatigue.  HENT:  Negative for mouth sores and mouth dryness.   Eyes:  Negative for dryness.  Respiratory:  Negative for shortness of breath.   Cardiovascular:  Negative for chest pain and palpitations.  Gastrointestinal:  Negative for blood in stool, constipation and diarrhea.  Endocrine: Negative for increased urination.  Genitourinary:  Negative for involuntary urination.  Musculoskeletal:  Positive for joint pain, joint pain, joint swelling, myalgias, morning stiffness and myalgias.  Negative for gait problem, muscle weakness and muscle tenderness.  Skin:  Negative for color change, rash, hair loss and sensitivity to sunlight.  Allergic/Immunologic: Negative for susceptible to infections.  Neurological:  Negative for dizziness and headaches.  Hematological:  Negative for swollen glands.  Psychiatric/Behavioral:  Negative for depressed mood and sleep disturbance. The patient is not nervous/anxious.     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   Hyperlipidemia 11/29/2019   Tobacco abuse 11/29/2019   Avascular necrosis of hip, right (HCC) 12/13/2016   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   Avascular necrosis of bones of both hips (  HCC) 12/07/2016   Pain of left hip joint 12/07/2016   Pain of right hip joint 12/07/2016   SIRS (systemic inflammatory response syndrome) (HCC) 08/07/2014   Cardiomyopathy- EF 40-45% presumed secondary to acute illness 08/07/2014   CAP (community acquired pneumonia)    Right upper quadrant pain    Acute pericarditis 08/04/2014   Acute hyponatremia 08/04/2014   Protein-calorie malnutrition, severe (HCC) 08/04/2014   Sepsis (HCC) 08/03/2014   Legionella pneumonia (HCC) 08/03/2014   Rheumatoid arthritis (HCC) 08/03/2014   Chest pain 08/03/2014   Elevated troponin- not fel to be secondary to MI 08/03/2014   Nausea vomiting and diarrhea 08/03/2014   Cervical spondylosis without myelopathy  11/06/2012    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   Exertional shortness of breath    "at times" (11/14/2012)   Hypercholesteremia    Hypertension    Pericarditis ~ 2009   Pneumonia    Rheumatoid arthritis(714.0)    Sepsis (HCC)    in the left ankle    Family History  Problem Relation Age of Onset   Cancer Mother    Hypertension Mother    Stroke Mother    Diabetes Mother    Cirrhosis Father    Lupus Sister    Lupus Sister    Breast cancer Sister    Past Surgical History:  Procedure Laterality Date   ANTERIOR CERVICAL DECOMP/DISCECTOMY FUSION N/A 11/14/2012   Procedure: ANTERIOR CERVICAL DECOMPRESSION/DISCECTOMY FUSION 1 LEVEL C5-6;  Surgeon: Venita Lick, MD;  Location: [facility] OR;  Service: Orthopedics;  Laterality: N/A;   APPENDECTOMY  1970's   BACK SURGERY     CARDIAC CATHETERIZATION  May 2010   normal   I & D EXTREMITY Left 02/02/2022   Procedure: IRRIGATION AND DEBRIDEMENT LEFT FOOT/ANKLE;  Surgeon: Candelaria Stagers, DPM;  Location: [facility] OR;  Service: Podiatry;  Laterality: Left;   I & D EXTREMITY Left 03/23/2022   Procedure: IRRIGATION AND DEBRIDEMENT EXTREMITY;  Surgeon: Louann Sjogren, DPM;  Location: [facility] OR;  Service: Podiatry;  Laterality: Left;   JOINT REPLACEMENT     TONSILLECTOMY  1970's?   TOTAL HIP ARTHROPLASTY Right 12/13/2016   TOTAL HIP ARTHROPLASTY Right 12/13/2016   Procedure: RIGHT TOTAL HIP ARTHROPLASTY ANTERIOR APPROACH;  Surgeon: Kathryne Hitch, MD;  Location: [facility] OR;  Service: Orthopedics;  Laterality: Right;   Social History   Social History Narrative   Not on file   Immunization History  Administered Date(s) Administered   Influenza,inj,Quad PF,6+ Mos 08/08/2014   PFIZER(Purple Top)SARS-COV-2 Vaccination 10/11/2019, 11/01/2019   PPD Test 06/18/2018   Pneumococcal Polysaccharide-23 08/08/2014   Tdap 11/15/2016     Objective: Vital Signs: BP 137/82 (BP Location: Left Arm, Patient  Position: Sitting, Cuff Size: Large)   Pulse 88   Resp 14   Ht 5' 10.5" (1.791 m)   Wt 157 lb (71.2 kg)   BMI 22.21 kg/m    Physical Exam Eyes:     Conjunctiva/sclera: Conjunctivae normal.  Cardiovascular:     Rate and Rhythm: Normal rate and regular rhythm.  Pulmonary:     Effort: Pulmonary effort is normal.     Breath sounds: Normal breath sounds.  Musculoskeletal:     Right lower leg: No edema.     Left lower leg: No edema.  Lymphadenopathy:     Cervical: No cervical adenopathy.  Skin:    General: Skin is warm and dry.  Findings: No rash.  Neurological:     Mental Status: He is alert.  Psychiatric:        Mood and Affect: Mood normal.      Musculoskeletal Exam:  Shoulders ROM restricted with severe stiffness, passive ROM is intact, no focal tenderness to pressure or swelling Left elbow firm, mobile nontender nodule in olecranon bursa, smaller nodule on proximal forearm Left wrist swelling worse on ulnar side, extending onto back of hand, ROM slightly restricted and is tender to pressure and with movement MCPs joint swelling on b/l hands, ROM near normal but with pain fully flexing Firm cyst on radial side of right 2nd finger middle phalanx Knees full ROM no tenderness or swelling Left ankle tenderness to pressure and with movement worse in plantarflexion, left MTPs squeeze painful  Investigation: No additional findings.  Imaging: DG Ankle Complete Left Result Date: 10/16/2023 Please see detailed radiograph report in office note.   Recent Labs: Lab Results  Component Value Date   WBC 8.9 07/11/2023   HGB 11.1 (L) 07/11/2023   PLT 506 (H) 07/11/2023   NA 134 07/11/2023   K 5.2 07/11/2023   CL 97 07/11/2023   CO2 20 07/11/2023   GLUCOSE 74 07/11/2023   BUN 9 07/11/2023   CREATININE 0.85 07/11/2023   BILITOT 0.3 07/11/2023   ALKPHOS 142 (H) 07/11/2023   AST 22 07/11/2023   ALT 15 07/11/2023   PROT 9.3 (H) 07/11/2023   ALBUMIN 3.7 (L) 07/11/2023    CALCIUM 9.2 07/11/2023   GFRAA >60 11/18/2019    Speciality Comments: No specialty comments available.  Procedures:  No procedures performed Allergies: Bee venom and Shellfish allergy   Assessment / Plan:     Visit Diagnoses: Rheumatoid arthritis involving multiple sites with positive rheumatoid factor (HCC) - Plan: methotrexate (RHEUMATREX) 2.5 MG tablet, predniSONE (DELTASONE) 10 MG tablet, Sedimentation rate Rheumatoid arthritis with significant joint inflammation and nodular formation. Methotrexate treatment ongoing without significant improvement. Nodular rheumatoid arthritis supports diagnosis.  He is not interested in intra-articular steroid injection today, but states he could be open to administering injectable RA drugs in the future if necessary. - Increase methotrexate to 10 tablets per week, split into two doses of 5 tablets each. - Repeat 2-week prednisone taper starting at 20 mg p.o. daily - Checking CBC and CMP for medication monitoring after new start of methotrexate and recommending dose titration - Maintain follow-up appointment in April to assess methotrexate effectiveness and consider biologic medications if necessary.  Septic arthritis of left ankle, due to unspecified organism (HCC) Foot Swelling Persistent swelling suspected to be related to rheumatoid arthritis given the overall picture.  Appreciate input from podiatry office and with coordinating for repeat MRI early next month for ruling out persistent septic arthritis or osteomyelitis which would be complicated by any long-term immunosuppressive treatment. - Review MRI results once available to confirm absence of infection.   Avascular necrosis of bones of both hips (HCC) HFrEF (heart failure with reduced ejection fraction) (HCC) High risk medication use - Plan: CBC with Differential/Platelet, Comprehensive metabolic panel with GFR, QuantiFERON-TB Gold Plus   Orders: Orders Placed This Encounter  Procedures    Sedimentation rate   CBC with Differential/Platelet   Comprehensive metabolic panel with GFR   QuantiFERON-TB Gold Plus   Meds ordered this encounter  Medications   methotrexate (RHEUMATREX) 2.5 MG tablet    Sig: Take 10 tablets (25 mg total) by mouth once a week. Caution:Chemotherapy. Protect from light.    Dispense:  50 tablet    Refill:  0   predniSONE (DELTASONE) 10 MG tablet    Sig: Take 2 tablets (20 mg total) by mouth daily with breakfast for 7 days, THEN 1 tablet (10 mg total) daily with breakfast for 7 days.    Dispense:  21 tablet    Refill:  0     Follow-Up Instructions: No follow-ups on file.   Fuller Plan, MD  Note - This record has been created using [employer].  Chart creation errors have been sought, but may not always  have been located. Such creation errors do not reflect on  the standard of medical care.

## 2023-10-20 ENCOUNTER — Other Ambulatory Visit: Payer: Self-pay

## 2023-10-22 LAB — COMPREHENSIVE METABOLIC PANEL WITH GFR
AG Ratio: 0.7 (calc) — ABNORMAL LOW (ref 1.0–2.5)
ALT: 19 U/L (ref 9–46)
AST: 22 U/L (ref 10–35)
Albumin: 3.5 g/dL — ABNORMAL LOW (ref 3.6–5.1)
Alkaline phosphatase (APISO): 107 U/L (ref 35–144)
BUN: 8 mg/dL (ref 7–25)
CO2: 23 mmol/L (ref 20–32)
Calcium: 9.3 mg/dL (ref 8.6–10.3)
Chloride: 102 mmol/L (ref 98–110)
Creat: 0.85 mg/dL (ref 0.70–1.30)
Globulin: 4.9 g/dL — ABNORMAL HIGH (ref 1.9–3.7)
Glucose, Bld: 82 mg/dL (ref 65–99)
Potassium: 4.8 mmol/L (ref 3.5–5.3)
Sodium: 134 mmol/L — ABNORMAL LOW (ref 135–146)
Total Bilirubin: 0.4 mg/dL (ref 0.2–1.2)
Total Protein: 8.4 g/dL — ABNORMAL HIGH (ref 6.1–8.1)
eGFR: 100 mL/min/{1.73_m2} (ref >=60)

## 2023-10-22 LAB — CBC WITH DIFFERENTIAL/PLATELET
Absolute Lymphocytes: 1249 {cells}/uL (ref 850–3900)
Absolute Monocytes: 198 {cells}/uL — ABNORMAL LOW (ref 200–950)
Basophils Absolute: 28 {cells}/uL (ref 0–200)
Basophils Relative: 0.5 %
Eosinophils Absolute: 50 {cells}/uL (ref 15–500)
Eosinophils Relative: 0.9 %
HCT: 35.2 % — ABNORMAL LOW (ref 38.5–50.0)
Hemoglobin: 10.7 g/dL — ABNORMAL LOW (ref 13.2–17.1)
MCH: 24.9 pg — ABNORMAL LOW (ref 27.0–33.0)
MCHC: 30.4 g/dL — ABNORMAL LOW (ref 32.0–36.0)
MCV: 82.1 fL (ref 80.0–100.0)
MPV: 8.9 fL (ref 7.5–12.5)
Monocytes Relative: 3.6 %
Neutro Abs: 3977 {cells}/uL (ref 1500–7800)
Neutrophils Relative %: 72.3 %
Platelets: 496 10*3/uL — ABNORMAL HIGH (ref 140–400)
RBC: 4.29 10*6/uL (ref 4.20–5.80)
RDW: 15.2 % — ABNORMAL HIGH (ref 11.0–15.0)
Total Lymphocyte: 22.7 %
WBC: 5.5 10*3/uL (ref 3.8–10.8)

## 2023-10-22 LAB — QUANTIFERON-TB GOLD PLUS
Mitogen-NIL: 0.49 [IU]/mL
NIL: 0.04 [IU]/mL
QuantiFERON-TB Gold Plus: UNDETERMINED — AB
TB1-NIL: 0.01 [IU]/mL
TB2-NIL: 0.02 [IU]/mL

## 2023-10-22 LAB — SEDIMENTATION RATE: Sed Rate: 122 mm/h — ABNORMAL HIGH (ref 0–20)

## 2023-10-23 ENCOUNTER — Ambulatory Visit: Admitting: Podiatry

## 2023-10-31 ENCOUNTER — Ambulatory Visit
Admission: RE | Admit: 2023-10-31 | Discharge: 2023-10-31 | Disposition: A | Discharge status: Home / Self Care | Source: Ambulatory Visit | Attending: Podiatry | Admitting: Podiatry

## 2023-10-31 DIAGNOSIS — M00072 Staphylococcal arthritis, left ankle and foot: Secondary | ICD-10-CM

## 2023-10-31 DIAGNOSIS — M76822 Posterior tibial tendinitis, left leg: Secondary | ICD-10-CM | POA: Diagnosis not present

## 2023-10-31 DIAGNOSIS — R936 Abnormal findings on diagnostic imaging of limbs: Secondary | ICD-10-CM | POA: Diagnosis not present

## 2023-10-31 DIAGNOSIS — M25572 Pain in left ankle and joints of left foot: Secondary | ICD-10-CM | POA: Diagnosis not present

## 2023-11-01 LAB — CBC WITH DIFFERENTIAL/PLATELET
Absolute Lymphocytes: 1094 {cells}/uL (ref 850–3900)
Absolute Monocytes: 655 {cells}/uL (ref 200–950)
Basophils Absolute: 58 {cells}/uL (ref 0–200)
Basophils Relative: 0.8 %
Eosinophils Absolute: 58 {cells}/uL (ref 15–500)
Eosinophils Relative: 0.8 %
HCT: 33.5 % — ABNORMAL LOW (ref 38.5–50.0)
Hemoglobin: 10.7 g/dL — ABNORMAL LOW (ref 13.2–17.1)
MCH: 25.7 pg — ABNORMAL LOW (ref 27.0–33.0)
MCHC: 31.9 g/dL — ABNORMAL LOW (ref 32.0–36.0)
MCV: 80.3 fL (ref 80.0–100.0)
MPV: 8.8 fL (ref 7.5–12.5)
Monocytes Relative: 9.1 %
Neutro Abs: 5335 {cells}/uL (ref 1500–7800)
Neutrophils Relative %: 74.1 %
Platelets: 443 10*3/uL — ABNORMAL HIGH (ref 140–400)
RBC: 4.17 10*6/uL — ABNORMAL LOW (ref 4.20–5.80)
RDW: 15.2 % — ABNORMAL HIGH (ref 11.0–15.0)
Total Lymphocyte: 15.2 %
WBC: 7.2 10*3/uL (ref 3.8–10.8)

## 2023-11-01 LAB — C-REACTIVE PROTEIN: CRP: 31.3 mg/L — ABNORMAL HIGH (ref <8.0)

## 2023-11-01 LAB — SEDIMENTATION RATE: Sed Rate: 129 mm/h — ABNORMAL HIGH (ref 0–20)

## 2023-11-04 DIAGNOSIS — Z419 Encounter for procedure for purposes other than remedying health state, unspecified: Secondary | ICD-10-CM | POA: Diagnosis not present

## 2023-11-07 ENCOUNTER — Encounter: Payer: Self-pay | Admitting: Podiatry

## 2023-11-07 NOTE — Progress Notes (Signed)
 Office Visit Note  Patient: Dennis Harris             Date of Birth: 08/15/1965           MRN: [id number]             PCP: Marius Siemens, NP Referring: Marius Siemens, NP Visit Date: 11/16/2023   Subjective:  Follow-up (Patient states he feels the methotrexate  is not working. )   Discussed the use of AI scribe software for clinical note transcription with the patient, who gave verbal consent to proceed.  History of Present Illness   Dennis Harris is a 58 y.o. male here for follow up with seropositive rheumatoid arthritis on methotrexate  25 mg PO weekly and folic acid  1 mg daily.  He experiences significant joint pain, particularly in the shoulder and hands, which affects his daily activities. He wakes up with pain and has difficulty moving due to shoulder pain. He also has pain and swelling in the hands, with difficulty bending fingers and performing tasks. He describes the pain as 'wicked' and is 'getting tired of waking up every morning' with pain.  Prednisone  was helpful when taken for a couple of weeks. He has a history of ankle infection that required surgery, and there is a concern about residual infection. An MRI of the ankle was performed on the 8th of the month, but the results have not yet been read due to a shortage of radiologists in the area.  He has not been sick with anything else recently. He is not currently seeing a cardiologist, although he has been on heart medications in the past. He recalls a discussion about Viola Greulich but is unsure about current use.  No personal or strong family history of lymphoma or skin cancer. His sister-in-law had a 'crippling type' of arthritis that severely affected her hand function.       Previous HPI 10/19/2023 Dennis Harris is a 58 year old male with rheumatoid arthritis who presents with persistent joint pain and swelling despite currently taking methotrexate  15 mg p.o. weekly and folic acid  1 mg daily.   After our previous visit he took the prednisone  for 2 weeks with a good improvement in symptoms but started noticing pain and stiffness worsening again within about 1 week after completing this taper.   He experiences persistent joint pain and swelling, particularly in the wrists, shoulders, and hands. Despite being on methotrexate  for nearly two months, his symptoms have not improved. He initially found relief with prednisone , but symptoms returned about a week after discontinuation.   He is currently taking methotrexate . Swelling is primarily in the wrists, with the right wrist being notably affected. He uses lidocaine  patches for pain management, which provide some relief. He also reports a cyst on his right index finger and nodules on his left elbow, which are not tender.   He describes significant discomfort, particularly in the mornings, stating it takes him a few minutes to move upon waking. He also experiences difficulty getting comfortable in bed due to pain, especially in his wrists and shoulders.   He recently visited his foot doctor, who noted swelling in his foot, likely related to arthritis. An MRI is scheduled to rule out any lingering infection from a previous surgery. He experiences some soreness in his ankle and foot area. No significant swelling in the elbow. Extensive swelling in the wrist and some soreness in the ankle and foot area.  Previous HPI 08/21/23 Dennis Harris is a 58 y.o. male here for follow up for RA currently off treatment due to a long interval after needing surgery for left ankle septic joint and subsequently was without health insurance until establishing medicaid last November. He presents with worsening joint pain and swelling. They report that their symptoms have been progressively worsening, affecting their mobility and quality of life. The patient describes the pain as intense and affecting multiple joints, including the shoulders, hands, and  ankles. They also report morning stiffness, requiring them to take small steps and lean against the wall for support upon waking.   The patient has a history of septic arthritis in the left ankle, which required two surgeries in 2023. Despite the surgeries, the patient reports persistent swelling in the ankle. They also report the development of nodules on their right index finger and elbow, which they initially believed to be cysts.   The patient has been off methotrexate , their previous RA medication, due to issues with Medicaid. They report a significant increase in symptoms since discontinuing the medication. They have been managing their symptoms with ibuprofen , but report that it is no longer effective. They also report a brief period of symptom relief after a short course of prednisone  prescribed by their primary care provider.   12/28/2021 Dennis Harris is a 58 y.o. male here for follow up for  history of RA after starting methotrexate  15 mg PO weekly. He feels his joint pain and swelling are improved in most areas but the left ankle is not getting any better. He took prednisone  initially but even while on this his ankle did not decrease swelling noticeably. 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   11/22/2021 Dennis Harris is a 58 y.o. male here for joint pains he has history of RA treated with methotrexate  and recent ED visits due to flare ups treated with prednisone .  He was diagnosed sometime around 2007 to 2009 with rheumatoid arthritis due to joint inflammation in multiple areas.  He was on oral methotrexate  for years for treatment which had been effective.  Also treated episodically with steroids for active inflammation which was generally beneficial and no major side effects.  Most recent other rheumatology provider with Dr. Meredith Stalls but has not seen her for more than a year and due to  loss of insurance when he lost his previous employment.  As result he has been off any maintenance treatment throughout last year.  He also has a history of degenerative arthritis and AVN and had anterior cervical spine fusion and right hip replacement. He developed skin rashes on his chest years ago saw dermatology a decade or more ago was not thought to represent any significant ongoing process.  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   Labs reviewed 11/2021 HBV neg HCV neg   05/2018 ANA neg RF 168 CCP >250   Review of Systems  Constitutional:  Negative for fatigue.  HENT:  Negative for mouth sores and mouth dryness.   Eyes:  Negative for dryness.  Respiratory:  Negative for shortness of breath.   Cardiovascular:  Negative for chest pain and palpitations.  Gastrointestinal:  Negative for blood in stool, constipation and diarrhea.  Endocrine: Negative for increased urination.  Genitourinary:  Negative for involuntary urination.  Musculoskeletal:  Positive for joint pain, joint pain, joint swelling,  myalgias, morning stiffness and myalgias. Negative for gait problem, muscle weakness and muscle tenderness.  Skin:  Negative for color change, rash, hair loss and sensitivity to sunlight.  Allergic/Immunologic: Negative for susceptible to infections.  Neurological:  Negative for dizziness and headaches.  Hematological:  Negative for swollen glands.  Psychiatric/Behavioral:  Positive for sleep disturbance. Negative for depressed mood. The patient is not nervous/anxious.     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/2021   Hyperlipidemia 11/29/2019   Tobacco abuse 11/29/2019   Avascular necrosis of hip, right (HCC) 12/13/2016   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   Avascular necrosis of bones of both hips (HCC) 12/07/2016   Pain of left hip joint 12/07/2016   Pain of right hip joint 12/07/2016   SIRS (systemic inflammatory response syndrome) (HCC) 08/07/2014   Cardiomyopathy- EF 40-45% presumed secondary to acute illness 08/07/2014   CAP (community acquired pneumonia)    Right upper quadrant pain    Acute pericarditis 08/04/2014   Acute hyponatremia 08/04/2014   Protein-calorie malnutrition, severe (HCC) 08/04/2014   Sepsis (HCC) 08/03/2014   Legionella pneumonia (HCC) 08/03/2014   Rheumatoid arthritis (HCC) 08/03/2014   Chest pain 08/03/2014   Elevated troponin- not fel to be secondary to MI 08/03/2014   Nausea vomiting and diarrhea 08/03/2014   Cervical spondylosis without myelopathy 11/06/2012    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   Exertional shortness of breath    "at times" (11/14/2012)   Hypercholesteremia    Hypertension    Pericarditis ~ 2009   Pneumonia    Rheumatoid arthritis(714.0)    Sepsis (HCC)    in the left ankle    Family History  Problem Relation Age of Onset   Cancer Mother    Hypertension Mother    Stroke Mother    Diabetes Mother    Cirrhosis Father    Lupus Sister    Lupus Sister    Breast cancer Sister    Past Surgical History:  Procedure Laterality Date   ANTERIOR CERVICAL DECOMP/DISCECTOMY FUSION N/A 11/14/2012   Procedure: ANTERIOR CERVICAL DECOMPRESSION/DISCECTOMY FUSION 1 LEVEL C5-6;  Surgeon: Mort Ards, MD;  Location: [facility] OR;  Service: Orthopedics;  Laterality: N/A;    APPENDECTOMY  1970's   BACK SURGERY     CARDIAC CATHETERIZATION  May 2010   normal   I & D EXTREMITY Left 02/02/2022   Procedure: IRRIGATION AND DEBRIDEMENT LEFT FOOT/ANKLE;  Surgeon: Velma Ghazi, DPM;  Location: [facility] OR;  Service: Podiatry;  Laterality: Left;   I & D EXTREMITY Left 03/23/2022   Procedure: IRRIGATION AND DEBRIDEMENT EXTREMITY;  Surgeon: Jennefer Moats, DPM;  Location: [facility] OR;  Service: Podiatry;  Laterality: Left;   JOINT REPLACEMENT     TONSILLECTOMY  1970's?   TOTAL HIP ARTHROPLASTY Right 12/13/2016   TOTAL HIP ARTHROPLASTY Right 12/13/2016   Procedure: RIGHT TOTAL HIP  ARTHROPLASTY ANTERIOR APPROACH;  Surgeon: Arnie Lao, MD;  Location: [facility] OR;  Service: Orthopedics;  Laterality: Right;   Social History   Social History Narrative   Not on file   Immunization History  Administered Date(s) Administered   Influenza,inj,Quad PF,6+ Mos 08/08/2014   PFIZER(Purple Top)SARS-COV-2 Vaccination 10/11/2019, 11/01/2019   PPD Test 06/18/2018   Pneumococcal Polysaccharide-23 08/08/2014   Tdap 11/15/2016     Objective: Vital Signs: BP (!) 185/96 (BP Location: Left Arm, Patient Position: Sitting, Cuff Size: Normal)   Pulse 83   Resp 14   Ht 5' 10.5" (1.791 m)   Wt 158 lb (71.7 kg)   BMI 22.35 kg/m    Physical Exam Cardiovascular:     Rate and Rhythm: Normal rate and regular rhythm.  Pulmonary:     Effort: Pulmonary effort is normal.     Breath sounds: Normal breath sounds.  Skin:    General: Skin is warm and dry.  Neurological:     Mental Status: He is alert.  Psychiatric:        Mood and Affect: Mood normal.      Musculoskeletal Exam:  Shoulders ROM restricted with stiffness worse active movement, passive ROM is intact, no focal tenderness to pressure or swelling Left elbow firm, mobile nontender nodule in olecranon bursa, smaller nodule on proximal forearm Left wrist swelling worse on ulnar side, extending onto back of hand, ROM slightly restricted  and is tender to pressure and with movement MCPs joint swelling on b/l hands, ROM near normal but with pain fully flexing Firm cyst on radial side of right 2nd finger middle phalanx Knees full ROM no tenderness or swelling Left ankle no focal tenderness or palpable swelling  Investigation: No additional findings.  Imaging: No results found.   Recent Labs: Lab Results  Component Value Date   WBC 7.2 10/31/2023   HGB 10.7 (L) 10/31/2023   PLT 443 (H) 10/31/2023   NA 134 (L) 10/19/2023   K 4.8 10/19/2023   CL 102 10/19/2023   CO2 23 10/19/2023   GLUCOSE 82 10/19/2023   BUN 8 10/19/2023   CREATININE 0.85 10/19/2023   BILITOT 0.4 10/19/2023   ALKPHOS 142 (H) 07/11/2023   AST 22 10/19/2023   ALT 19 10/19/2023   PROT 8.4 (H) 10/19/2023   ALBUMIN 3.7 (L) 07/11/2023   CALCIUM  9.3 10/19/2023   GFRAA >60 11/18/2019   QFTBGOLDPLUS INDETERMINATE (A) 10/19/2023    Speciality Comments: No specialty comments available.  Procedures:  No procedures performed Allergies: Bee venom and Shellfish allergy   Assessment / Plan:     Visit Diagnoses: Rheumatoid arthritis involving multiple sites with positive rheumatoid factor (HCC) Chronic rheumatoid arthritis with joint deformity and damage, particularly in hands and wrists. Current methotrexate  treatment insufficient. Discussed risks and benefits of biologic therapy, including infection and cancer risks. Emphasized preventing further joint damage and improving quality of life. Humira has long-term efficacy and safety data. No risk factors for increased adverse effects. Medication expected to decrease joint pain, stiffness, and swelling, and prevent further joint damage. - Continue methotrexate  25 mg p.o. weekly and folic acid  1 mg daily - Plan to start adalimumab 40 mg subcu q. 14 days - Schedule appointment for first dose administration in office with pharmacist supervision. - Repeat prednisone  taper x 12 days for active disease, avoiding  long-term maintenance exposure given history of AVN and septic arthritis  High risk medication use Recent labs reviewed from podiatry office blood counts remain normal.  Tolerating  methotrexate  with no abnormal renal or hepatic function.  No new intolerance on the higher dose methotrexate  but mostly just seemed ineffective.  Reviewed risk of Humira including injection site reactions, infections, malignancy risk with long-term use.  He has history of congestive heart failure but not severe disease. - Recheck previous indeterminate QuantiFERON baseline prior to biologic DMARD start  Avascular necrosis of bones of both hips (HCC)  HFrEF (heart failure with reduced ejection fraction) (HCC) History of systolic congestive heart failure but appears to been more associated with acute illness.  He is not on an extensive pharmacotherapy and not seeing cardiology for complicated disease.  Do not believe this is a contraindication to TNF inhibitor.  Septic arthritis of left ankle, due to unspecified organism [facility]) - Review MRI results once available to confirm absence of infection. Previous ankle infection with surgical intervention. Concern for potential residual infection contraindicating biologic therapy. Awaiting MRI results to rule out active infection. - Contact radiology to expedite MRI reading. - Review MRI results prior to starting biologic therapy.     Orders: Orders Placed This Encounter  Procedures   QuantiFERON-TB Gold Plus   Meds ordered this encounter  Medications   predniSONE  (DELTASONE ) 5 MG tablet    Sig: Take 4 tablets (20 mg total) by mouth daily with breakfast for 3 days, THEN 3 tablets (15 mg total) daily with breakfast for 3 days, THEN 2 tablets (10 mg total) daily with breakfast for 3 days, THEN 1 tablet (5 mg total) daily with breakfast for 3 days.    Dispense:  30 tablet    Refill:  0   methotrexate  (RHEUMATREX) 2.5 MG tablet    Sig: Take 10 tablets (25 mg total) by mouth  once a week. Caution:Chemotherapy. Protect from light.    Dispense:  120 tablet    Refill:  0   folic acid  (FOLVITE ) 1 MG tablet    Sig: Take 1 tablet (1 mg total) by mouth daily.    Dispense:  90 tablet    Refill:  3     Follow-Up Instructions: Return in about 3 months (around 02/15/2024) for RA on MTX/ADA start f/u 3mos.   Matt Song, MD  Note - This record has been created using [employer].  Chart creation errors have been sought, but may not always  have been located. Such creation errors do not reflect on  the standard of medical care.

## 2023-11-16 ENCOUNTER — Encounter: Payer: Self-pay | Admitting: Internal Medicine

## 2023-11-16 ENCOUNTER — Encounter: Payer: Self-pay | Admitting: Podiatry

## 2023-11-16 ENCOUNTER — Ambulatory Visit: Attending: Internal Medicine | Admitting: Internal Medicine

## 2023-11-16 ENCOUNTER — Other Ambulatory Visit: Payer: Self-pay

## 2023-11-16 VITALS — BP 185/96 | HR 83 | Resp 14 | Ht 70.5 in | Wt 158.0 lb

## 2023-11-16 DIAGNOSIS — M87051 Idiopathic aseptic necrosis of right femur: Secondary | ICD-10-CM | POA: Diagnosis not present

## 2023-11-16 DIAGNOSIS — Z79899 Other long term (current) drug therapy: Secondary | ICD-10-CM

## 2023-11-16 DIAGNOSIS — M069 Rheumatoid arthritis, unspecified: Secondary | ICD-10-CM | POA: Diagnosis not present

## 2023-11-16 DIAGNOSIS — I5020 Unspecified systolic (congestive) heart failure: Secondary | ICD-10-CM | POA: Diagnosis not present

## 2023-11-16 DIAGNOSIS — M0579 Rheumatoid arthritis with rheumatoid factor of multiple sites without organ or systems involvement: Secondary | ICD-10-CM | POA: Diagnosis not present

## 2023-11-16 DIAGNOSIS — M87052 Idiopathic aseptic necrosis of left femur: Secondary | ICD-10-CM | POA: Diagnosis not present

## 2023-11-16 DIAGNOSIS — M009 Pyogenic arthritis, unspecified: Secondary | ICD-10-CM | POA: Diagnosis not present

## 2023-11-16 MED ORDER — FOLIC ACID 1 MG PO TABS
1.0000 mg | ORAL_TABLET | Freq: Every day | ORAL | 3 refills | Status: DC
  Filled 2023-11-16: qty 90, 90d supply, fill #0

## 2023-11-16 MED ORDER — PREDNISONE 5 MG PO TABS
ORAL_TABLET | ORAL | 0 refills | Status: AC
  Filled 2023-11-16: qty 30, 12d supply, fill #0

## 2023-11-16 MED ORDER — METHOTREXATE SODIUM 2.5 MG PO TABS
25.0000 mg | ORAL_TABLET | ORAL | 0 refills | Status: DC
  Filled 2023-11-16: qty 120, 84d supply, fill #0

## 2023-11-19 LAB — QUANTIFERON-TB GOLD PLUS
Mitogen-NIL: 2.6 [IU]/mL
NIL: 0.01 [IU]/mL
QuantiFERON-TB Gold Plus: NEGATIVE
TB1-NIL: 0.01 [IU]/mL
TB2-NIL: 0 [IU]/mL

## 2023-11-20 ENCOUNTER — Other Ambulatory Visit: Payer: Self-pay

## 2023-11-20 ENCOUNTER — Telehealth: Payer: Self-pay | Admitting: Pharmacist

## 2023-11-20 DIAGNOSIS — Z79899 Other long term (current) drug therapy: Secondary | ICD-10-CM

## 2023-11-20 DIAGNOSIS — M0579 Rheumatoid arthritis with rheumatoid factor of multiple sites without organ or systems involvement: Secondary | ICD-10-CM

## 2023-11-20 NOTE — Progress Notes (Signed)
 TB test was negative, so not a problem for starting new medication as planned

## 2023-11-20 NOTE — Telephone Encounter (Addendum)
 Submitted a Prior Authorization request to [facility] Medicaid for HUMIRA via CoverMyMeds. Will update once we receive a response.  Key: [id number]  Geraldene Kleine, PharmD, MPH, BCPS, CPP Clinical Pharmacist (Rheumatology and Pulmonology)   ----- Message from Gwenette Lennox sent at 11/20/2023  8:20 AM EDT ----- Regarding: HUMIRA Patient ok to start Humira per Dr. Rodell Citrin.   TB test was negative, so not a problem for starting new medication as planned

## 2023-11-21 ENCOUNTER — Other Ambulatory Visit: Payer: Self-pay

## 2023-11-21 MED ORDER — HUMIRA (2 PEN) 40 MG/0.4ML ~~LOC~~ AJKT
40.0000 mg | AUTO-INJECTOR | SUBCUTANEOUS | 0 refills | Status: DC
  Filled 2023-11-22: qty 2, 28d supply, fill #0

## 2023-11-21 NOTE — Telephone Encounter (Signed)
 Received notification from [facility] Medicaid regarding a prior authorization for HUMIRA. Authorization has been APPROVED from 11/06/23 to 11/19/24. Approval letter sent to scan center.  Copay for 28 days supply will be $4 [facility])  Patient can fill through [facility]: [phone]   Authorization # [id number]  Patient scheduled for Humira new start on 11/27/23. Rx sent to [facility] to be couriered to clinic before appt  Leor Whyte, PharmD, MPH, BCPS, CPP Clinical Pharmacist (Rheumatology and Pulmonology)

## 2023-11-22 ENCOUNTER — Ambulatory Visit: Admitting: Family Medicine

## 2023-11-22 ENCOUNTER — Other Ambulatory Visit: Payer: Self-pay

## 2023-11-22 ENCOUNTER — Other Ambulatory Visit (HOSPITAL_COMMUNITY): Payer: Self-pay

## 2023-11-22 NOTE — Progress Notes (Deleted)
 Pharmacy Note  Subjective:   Patient presents to clinic today to receive first dose of Humira for rheumatoid arthritis. Patient currently takes  methotrexate  25mg  po once weekly with folic acid  2mg  daily.  Patient running a fever or have signs/symptoms of infection? {yes/no:20286}  Patient currently on antibiotics for the treatment of infection? {yes/no:20286}  Patient have any upcoming invasive procedures/surgeries? {yes/no:20286}  Objective: CMP     Component Value Date/Time   NA 134 (L) 10/19/2023 1213   NA 134 07/11/2023 1046   K 4.8 10/19/2023 1213   CL 102 10/19/2023 1213   CO2 23 10/19/2023 1213   GLUCOSE 82 10/19/2023 1213   BUN 8 10/19/2023 1213   BUN 9 07/11/2023 1046   CREATININE 0.85 10/19/2023 1213   CALCIUM  9.3 10/19/2023 1213   PROT 8.4 (H) 10/19/2023 1213   PROT 9.3 (H) 07/11/2023 1046   ALBUMIN 3.7 (L) 07/11/2023 1046   AST 22 10/19/2023 1213   ALT 19 10/19/2023 1213   ALKPHOS 142 (H) 07/11/2023 1046   BILITOT 0.4 10/19/2023 1213   BILITOT 0.3 07/11/2023 1046   GFRNONAA >60 03/24/2022 0108   GFRAA >60 11/18/2019 1255    CBC    Component Value Date/Time   WBC 7.2 10/31/2023 0946   RBC 4.17 (L) 10/31/2023 0946   HGB 10.7 (L) 10/31/2023 0946   HGB 11.1 (L) 07/11/2023 1046   HCT 33.5 (L) 10/31/2023 0946   HCT 37.0 (L) 07/11/2023 1046   PLT 443 (H) 10/31/2023 0946   PLT 506 (H) 07/11/2023 1046   MCV 80.3 10/31/2023 0946   MCV 81 07/11/2023 1046   MCH 25.7 (L) 10/31/2023 0946   MCHC 31.9 (L) 10/31/2023 0946   RDW 15.2 (H) 10/31/2023 0946   RDW 13.6 07/11/2023 1046   LYMPHSABS 1.5 07/11/2023 1046   MONOABS 0.5 03/22/2022 1323   EOSABS 58 10/31/2023 0946   EOSABS 0.0 07/11/2023 1046   BASOSABS 58 10/31/2023 0946   BASOSABS 0.0 07/11/2023 1046    Baseline Immunosuppressant Therapy Labs TB GOLD    Latest Ref Rng & Units 11/16/2023    8:49 AM  Quantiferon TB Gold  Quantiferon TB Gold Plus NEGATIVE NEGATIVE    Hepatitis Panel    Latest Ref  Rng & Units 11/22/2021    9:53 AM  Hepatitis  Hep B Surface Ag NON-REACTIVE NON-REACTIVE   Hep B IgM NON-REACTIVE NON-REACTIVE   Hep C Ab NON-REACTIVE NON-REACTIVE    HIV Lab Results  Component Value Date   HIV Non Reactive 02/01/2022   Immunoglobulins   SPEP    Latest Ref Rng & Units 10/19/2023   12:13 PM  Serum Protein Electrophoresis  Total Protein 6.1 - 8.1 g/dL 8.4    Chest x-ray: 10/31/8117 -No active cardiopulmonary disease.    Ankle MRI on 10/31/23  IMPRESSION: 1. Moderate tenosynovitis and mild tendinopathy involving the posterior tibialis tendon. 2. Intact medial and lateral ankle ligaments. 3. Remote appearing focus of AVN or bone infarct involving the distal talus. No surrounding marrow edema to suggest an acute process but this is new since the prior MRI 2023. 4. Second smaller focus of AVN or bone infarct involving the medial navicular bone. This does have some surrounding marrow edema and may be a more acute process. 5. No stress fracture or evidence of osteomyelitis. 6. Mild subcutaneous soft tissue swelling/edema of uncertain significance or etiology.  Assessment/Plan:  Reviewed importance of holding Humira with signs/symptoms of an infections, if antibiotics are prescribed to treat an active infection,  and with invasive procedures  Demonstrated proper injection technique with Humira demo device  Patient able to demonstrate proper injection technique using the teach back method.  Patient self injected in the {injsitedsg:28167} with:  Pharmacy-Supplied Medication: Humira 40mg /0.38mL pen injector NDC: *** Lot: *** Expiration: ***  Patient tolerated well.  Observed for 30 mins in office for adverse reaction. {injectionreaction:30756}  Patient is to return in 1 month for labs and 6-8 weeks for follow-up appointment.  Standing orders for CBC/CMP placed.  TB gold will be monitored yearly. Lipid panel will be monitored every *** months. Referral to *** Dermatology  placed today for yearly skin checks while on TNF inhibitor due to risk for non melanoma skin cancer  Humira approved through insurance .   Rx sent to: [facility]: [phone] .  Patient provided with pharmacy phone number and advised that pharmacy will call to schedule shipment to home.  Patient will continue Humira 40mg  subcut every 14 days in combination with methotrexate  25mg  po once weekly with folic acid  2mg  daily.  All questions encouraged and answered.  Instructed patient to call with any further questions or concerns.  Geraldene Kleine, PharmD, MPH, BCPS, CPP Clinical Pharmacist (Rheumatology and Pulmonology)  11/22/2023 3:16 PM

## 2023-11-22 NOTE — Patient Instructions (Signed)
 Your next HUMIRA dose is due on 12/11/23, 12/25/23, and every 14 days thereafter  CONTINUE  methotrexate  25mg  po once weekly with folic acid  2mg  daily  HOLD HUMIRA if you have signs or symptoms of an infection. You can resume once you feel better or back to your baseline. HOLD HUMIRA if you start antibiotics to treat an infection. HOLD HUMIRA around the time of surgery/procedures. Your surgeon will be able to provide recommendations on when to hold BEFORE and when you are cleared to RESUME.  Pharmacy information: Your prescription will be shipped from [facility]. Their phone number is [phone] They will call to schedule shipment and confirm address. They will mail your medication to your home.  Labs are due in 1 month then every 3 months. Lab hours are from Monday to Thursday 8am-12:30pm and 1pm-5pm and Friday 8am-12pm. You do not need an appointment if you come for labs during these times. If you'd like to go to a [facility] or [facility] closer to home, please call our clinic 48 hours prior to lab date so we can release orders in a timely manner.  Stay up to date on all routine vaccines: influenza, pneumonia, COVID19, Shingles  How to manage an injection site reaction: Remember the 5 C's: COUNTER - leave on the counter at least 30 minutes but up to overnight to bring medication to room temperature. This may help prevent stinging COLD - place something cold (like an ice gel pack or cold water bottle) on the injection site just before cleansing with alcohol. This may help reduce pain CLARITIN - use Claritin (generic name is loratadine) for the first two weeks of treatment or the day of, the day before, and the day after injecting. This will help to minimize injection site reactions CORTISONE CREAM - apply if injection site is irritated and itching CALL ME - if injection site reaction is bigger than the size of your fist, looks infected, blisters, or if you develop hives

## 2023-11-22 NOTE — Progress Notes (Signed)
 Specialty Pharmacy Initial Fill Coordination Note  Dennis Harris is a 58 y.o. male contacted today regarding initial fill of specialty medication(s) Adalimumab (Humira (2 Pen))   Patient requested Courier to Provider Office   Delivery date: 11/23/23   Verified address: [address] [place], [place] [address]   Medication will be filled on 11/22/23.   Patient is aware of $4 copayment. Is not able to pay the copay at this time, would like it billed to [facility] account. Will add card on file at a later time once he is able to return to work.

## 2023-11-22 NOTE — Telephone Encounter (Signed)
 Patient is returning call concerning the specialty pharmacy . Patient has questions and concerns will get patient transferred over to the number I see

## 2023-11-23 NOTE — Telephone Encounter (Signed)
 Will address any further questions at upcoming new start visit on 11/27/23

## 2023-11-27 ENCOUNTER — Other Ambulatory Visit: Payer: Self-pay

## 2023-11-27 ENCOUNTER — Ambulatory Visit: Attending: Internal Medicine | Admitting: Pharmacist

## 2023-11-27 DIAGNOSIS — Z7189 Other specified counseling: Secondary | ICD-10-CM | POA: Diagnosis not present

## 2023-11-27 DIAGNOSIS — M0579 Rheumatoid arthritis with rheumatoid factor of multiple sites without organ or systems involvement: Secondary | ICD-10-CM | POA: Diagnosis not present

## 2023-11-27 DIAGNOSIS — Z79899 Other long term (current) drug therapy: Secondary | ICD-10-CM

## 2023-11-27 MED ORDER — HUMIRA (2 PEN) 40 MG/0.4ML ~~LOC~~ AJKT
40.0000 mg | AUTO-INJECTOR | SUBCUTANEOUS | 1 refills | Status: DC
  Filled 2023-11-27 – 2023-12-14 (×2): qty 2, 28d supply, fill #0
  Filled 2024-01-16: qty 2, 28d supply, fill #1

## 2023-11-27 MED ORDER — HUMIRA (2 PEN) 40 MG/0.4ML ~~LOC~~ AJKT: 40.0000 mg | AUTO-INJECTOR | SUBCUTANEOUS | 1 refills | Status: DC

## 2023-11-27 NOTE — Progress Notes (Signed)
 Pharmacy Note  Subjective:   Patient presents to clinic today to receive first dose of Humira  for rheumatoid arthritis. Patient currently takes  methotrexate  25mg  po once weekly with folic acid  2mg  daily.  Patient running a fever or have signs/symptoms of infection? Yes  Patient currently on antibiotics for the treatment of infection? Yes  Patient have any upcoming invasive procedures/surgeries? Yes  Objective: CMP     Component Value Date/Time   NA 134 (L) 10/19/2023 1213   NA 134 07/11/2023 1046   K 4.8 10/19/2023 1213   CL 102 10/19/2023 1213   CO2 23 10/19/2023 1213   GLUCOSE 82 10/19/2023 1213   BUN 8 10/19/2023 1213   BUN 9 07/11/2023 1046   CREATININE 0.85 10/19/2023 1213   CALCIUM  9.3 10/19/2023 1213   PROT 8.4 (H) 10/19/2023 1213   PROT 9.3 (H) 07/11/2023 1046   ALBUMIN 3.7 (L) 07/11/2023 1046   AST 22 10/19/2023 1213   ALT 19 10/19/2023 1213   ALKPHOS 142 (H) 07/11/2023 1046   BILITOT 0.4 10/19/2023 1213   BILITOT 0.3 07/11/2023 1046   GFRNONAA >60 03/24/2022 0108   GFRAA >60 11/18/2019 1255    CBC    Component Value Date/Time   WBC 7.2 10/31/2023 0946   RBC 4.17 (L) 10/31/2023 0946   HGB 10.7 (L) 10/31/2023 0946   HGB 11.1 (L) 07/11/2023 1046   HCT 33.5 (L) 10/31/2023 0946   HCT 37.0 (L) 07/11/2023 1046   PLT 443 (H) 10/31/2023 0946   PLT 506 (H) 07/11/2023 1046   MCV 80.3 10/31/2023 0946   MCV 81 07/11/2023 1046   MCH 25.7 (L) 10/31/2023 0946   MCHC 31.9 (L) 10/31/2023 0946   RDW 15.2 (H) 10/31/2023 0946   RDW 13.6 07/11/2023 1046   LYMPHSABS 1.5 07/11/2023 1046   MONOABS 0.5 03/22/2022 1323   EOSABS 58 10/31/2023 0946   EOSABS 0.0 07/11/2023 1046   BASOSABS 58 10/31/2023 0946   BASOSABS 0.0 07/11/2023 1046    Baseline Immunosuppressant Therapy Labs TB GOLD    Latest Ref Rng & Units 11/16/2023    8:49 AM  Quantiferon TB Gold  Quantiferon TB Gold Plus NEGATIVE NEGATIVE    Hepatitis Panel    Latest Ref Rng & Units 11/22/2021    9:53 AM   Hepatitis  Hep B Surface Ag NON-REACTIVE NON-REACTIVE   Hep B IgM NON-REACTIVE NON-REACTIVE   Hep C Ab NON-REACTIVE NON-REACTIVE    HIV Lab Results  Component Value Date   HIV Non Reactive 02/01/2022   Immunoglobulins   SPEP    Latest Ref Rng & Units 10/19/2023   12:13 PM  Serum Protein Electrophoresis  Total Protein 6.1 - 8.1 g/dL 8.4    Chest x-ray: 10/31/8117 -No active cardiopulmonary disease.    Ankle MRI on 10/31/23  IMPRESSION: 1. Moderate tenosynovitis and mild tendinopathy involving the posterior tibialis tendon. 2. Intact medial and lateral ankle ligaments. 3. Remote appearing focus of AVN or bone infarct involving the distal talus. No surrounding marrow edema to suggest an acute process but this is new since the prior MRI 2023. 4. Second smaller focus of AVN or bone infarct involving the medial navicular bone. This does have some surrounding marrow edema and may be a more acute process. 5. No stress fracture or evidence of osteomyelitis. 6. Mild subcutaneous soft tissue swelling/edema of uncertain significance or etiology.  Assessment/Plan:  Reviewed importance of holding Humira  with signs/symptoms of an infections, if antibiotics are prescribed to treat an active infection,  and with invasive procedures  Demonstrated proper injection technique with Humira  demo device  Patient able to demonstrate proper injection technique using the teach back method.  Patient self injected in the left upper thigh with:  Pharmacy-Supplied Medication: Humira  40mg /0.47mL pen injector NDC: 1610-9604-54 Lot: [id number] Expiration: 02/21/2025  Patient was not able to obtain full dose due to user error. He and his spouse reported that they feel more comfortable injecting at home as he is very needlephobic. His wife will help him inject.   Patient is to return in 1 month for labs and 6-8 weeks for follow-up appointment.  Standing orders for CBC/CMP placed.  TB gold will be monitored  yearly. Referral to [facility] placed today for yearly skin checks while on TNF inhibitor due to risk for non melanoma skin cancer  Humira  approved through insurance .   Rx sent to: [facility]: [phone] . Patient provided with pharmacy phone number and advised that pharmacy will call to schedule shipment to home.  Patient will continue Humira  40mg  subcut every 14 days in combination with methotrexate  25mg  po once weekly with folic acid  2mg  daily.  All questions encouraged and answered.  Instructed patient to call with any further questions or concerns.  Geraldene Kleine, PharmD, MPH, BCPS, CPP Clinical Pharmacist (Rheumatology and Pulmonology)  11/27/2023 8:26 AM

## 2023-11-27 NOTE — Progress Notes (Signed)
 Patient started Humira  in office today for RA. Currently takes MTX 25mg  po once weekly with folic acid  2mg  daily  Patient will continue Humira  40mg  subcut every 14 days in combination with methotrexate  25mg  po once weekly with folic acid  2mg  daily..  Patient was not able to obtain full dose due to user error. He and his spouse reported that they feel more comfortable injecting at home as he is very needlephobic. His wife will help him inject. They declined a second appointment for re-teaching of injection technique   Dennis Harris, PharmD, MPH, BCPS, CPP Clinical Pharmacist (Rheumatology and Pulmonology)

## 2023-12-04 ENCOUNTER — Encounter (HOSPITAL_COMMUNITY): Payer: Self-pay

## 2023-12-04 DIAGNOSIS — Z419 Encounter for procedure for purposes other than remedying health state, unspecified: Secondary | ICD-10-CM | POA: Diagnosis not present

## 2023-12-05 ENCOUNTER — Other Ambulatory Visit (HOSPITAL_COMMUNITY): Payer: Self-pay

## 2023-12-05 ENCOUNTER — Telehealth (INDEPENDENT_AMBULATORY_CARE_PROVIDER_SITE_OTHER): Payer: Self-pay | Admitting: Primary Care

## 2023-12-05 NOTE — Telephone Encounter (Signed)
 Spoke to pt about upcoming appt.. Will be present

## 2023-12-07 ENCOUNTER — Other Ambulatory Visit: Payer: Self-pay

## 2023-12-08 ENCOUNTER — Telehealth (INDEPENDENT_AMBULATORY_CARE_PROVIDER_SITE_OTHER): Payer: Self-pay | Admitting: Primary Care

## 2023-12-08 NOTE — Telephone Encounter (Signed)
 Called pt to confirm appt. Pt will be present.

## 2023-12-11 ENCOUNTER — Other Ambulatory Visit: Payer: Self-pay

## 2023-12-11 ENCOUNTER — Ambulatory Visit (INDEPENDENT_AMBULATORY_CARE_PROVIDER_SITE_OTHER): Admitting: Primary Care

## 2023-12-14 ENCOUNTER — Other Ambulatory Visit: Payer: Self-pay

## 2023-12-14 ENCOUNTER — Other Ambulatory Visit (HOSPITAL_COMMUNITY): Payer: Self-pay

## 2023-12-14 NOTE — Progress Notes (Signed)
 Specialty Pharmacy Refill Coordination Note  Dennis Harris is a 58 y.o. male contacted today regarding refills of specialty medication(s) Adalimumab  (Humira  (2 Pen))   Patient requested Delivery   Delivery date: 12/21/23   Verified address: [address] [place], [address]   Medication will be filled on 12/20/23.

## 2023-12-20 ENCOUNTER — Other Ambulatory Visit: Payer: Self-pay

## 2023-12-25 ENCOUNTER — Telehealth (INDEPENDENT_AMBULATORY_CARE_PROVIDER_SITE_OTHER): Payer: Self-pay | Admitting: Primary Care

## 2023-12-25 NOTE — Telephone Encounter (Signed)
 Called pt to confirm appt. Pt will be present.

## 2023-12-26 ENCOUNTER — Telehealth (INDEPENDENT_AMBULATORY_CARE_PROVIDER_SITE_OTHER): Payer: Self-pay | Admitting: Primary Care

## 2023-12-26 ENCOUNTER — Ambulatory Visit (INDEPENDENT_AMBULATORY_CARE_PROVIDER_SITE_OTHER): Admitting: Primary Care

## 2023-12-26 NOTE — Telephone Encounter (Signed)
 Pt was scheduled for an appt and missed it. Pt wanted to keep appt for 6/27

## 2024-01-04 DIAGNOSIS — Z419 Encounter for procedure for purposes other than remedying health state, unspecified: Secondary | ICD-10-CM | POA: Diagnosis not present

## 2024-01-16 ENCOUNTER — Other Ambulatory Visit: Payer: Self-pay

## 2024-01-16 NOTE — Progress Notes (Signed)
 Specialty Pharmacy Refill Coordination Note  Dennis Harris is a 58 y.o. male contacted today regarding refills of specialty medication(s) Adalimumab  (Humira  (2 Pen))   Patient requested Delivery   Delivery date: 01/17/24   Verified address: [address] [place], [address]   Medication will be filled on 01/16/24.

## 2024-01-18 ENCOUNTER — Telehealth (INDEPENDENT_AMBULATORY_CARE_PROVIDER_SITE_OTHER): Payer: Self-pay | Admitting: Primary Care

## 2024-01-18 NOTE — Telephone Encounter (Signed)
 Called pt to confirm appt. Pt will be present.

## 2024-01-19 ENCOUNTER — Other Ambulatory Visit: Payer: Self-pay

## 2024-01-19 ENCOUNTER — Ambulatory Visit (INDEPENDENT_AMBULATORY_CARE_PROVIDER_SITE_OTHER): Admitting: Primary Care

## 2024-01-19 ENCOUNTER — Encounter (INDEPENDENT_AMBULATORY_CARE_PROVIDER_SITE_OTHER): Payer: Self-pay | Admitting: Primary Care

## 2024-01-19 VITALS — BP 142/82 | HR 83 | Temp 97.8°F | Resp 16 | Ht 70.0 in | Wt 156.0 lb

## 2024-01-19 DIAGNOSIS — E782 Mixed hyperlipidemia: Secondary | ICD-10-CM

## 2024-01-19 DIAGNOSIS — Z760 Encounter for issue of repeat prescription: Secondary | ICD-10-CM | POA: Diagnosis not present

## 2024-01-19 DIAGNOSIS — M06322 Rheumatoid nodule, left elbow: Secondary | ICD-10-CM | POA: Diagnosis not present

## 2024-01-19 DIAGNOSIS — I10 Essential (primary) hypertension: Secondary | ICD-10-CM

## 2024-01-19 MED ORDER — LOSARTAN POTASSIUM 25 MG PO TABS
25.0000 mg | ORAL_TABLET | Freq: Every day | ORAL | 1 refills | Status: AC
  Filled 2024-01-19 – 2024-02-08 (×2): qty 90, 90d supply, fill #0
  Filled 2024-06-12: qty 90, 90d supply, fill #1

## 2024-01-19 MED ORDER — ATORVASTATIN CALCIUM 40 MG PO TABS
40.0000 mg | ORAL_TABLET | Freq: Every day | ORAL | 1 refills | Status: AC
  Filled 2024-01-19 – 2024-02-08 (×2): qty 90, 90d supply, fill #0
  Filled 2024-06-12: qty 90, 90d supply, fill #1

## 2024-01-19 MED ORDER — AMLODIPINE BESYLATE 10 MG PO TABS
10.0000 mg | ORAL_TABLET | Freq: Every day | ORAL | 1 refills | Status: AC
  Filled 2024-01-19 – 2024-02-08 (×2): qty 90, 90d supply, fill #0
  Filled 2024-06-12: qty 90, 90d supply, fill #1

## 2024-01-19 NOTE — Progress Notes (Signed)
 [facility]  Dennis Harris, is a 58 y.o. male  RDW:[id number]  FMW:[id number]  DOB - 1965-08-15  Chief Complaint  Patient presents with   Hypertension   Medication Refill       Subjective:   Dennis Harris is a 58 y.o. male here today for a follow up visit. Patient has No headache, No chest pain, No abdominal pain - No Nausea, No new weakness tingling or numbness, No Cough - shortness of breath  No problems updated.  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 2 each 1   folic acid  (FOLVITE ) 1 MG tablet Take 1 tablet (1 mg total) by mouth daily. 90 tablet 3   ibuprofen  (ADVIL ) 800 MG tablet Take 1 tablet (800 mg total) by mouth every 8 (eight) hours as needed. 90 tablet 1   methotrexate  (RHEUMATREX) 2.5 MG tablet Take 10 tablets (25 mg total) by mouth once a week. Caution:Chemotherapy. Protect from light. 120 tablet 0   No current facility-administered medications on file prior to visit.   Health Maintenance  Topic Date Due   Hepatitis B Vaccine (1 of 3 - 19+ 3-dose series) Never done   Zoster (Shingles) Vaccine (1 of 2) Never done   Colon Cancer Screening  Never done   Pneumococcal Vaccination (2 of 2 - PCV) 08/09/2015   COVID-19 Vaccine (3 - Pfizer risk series) 11/29/2019   Flu Shot  02/23/2024   DTaP/Tdap/Td vaccine (2 - Td or  Tdap) 11/16/2026   Hepatitis C Screening  Completed   HIV Screening  Completed   HPV Vaccine  Aged Out   Meningitis B Vaccine  Aged Out    Objective:   Vitals:   01/19/24 0936 01/19/24 1003  BP: (!) 140/80 (!) 142/82  Pulse: 83   Resp: 16   Temp: 97.8 F (36.6 C)   TempSrc: Oral   SpO2: 100%   Weight: 156 lb (70.8 kg)   Height: 5' 10 (1.778 m)    BP Readings from Last 3 Encounters:  01/19/24 (!) 142/82  11/16/23 (!) 185/96  10/19/23 137/82      Physical Exam Vitals reviewed.  HENT:     Head: Normocephalic.     Right Ear: Tympanic membrane and external ear normal.     Left Ear: Tympanic membrane and external ear normal.     Nose: Nose normal.  Eyes:     Extraocular Movements: Extraocular movements intact.     Pupils: Pupils are equal, round, and reactive to light.  Cardiovascular:     Rate and Rhythm: Normal rate and regular rhythm.  Pulmonary:     Effort: Pulmonary effort is normal.     Breath sounds: Normal breath sounds.  Abdominal:     General: Bowel sounds are normal. There is  distension.     Palpations: Abdomen is soft.  Musculoskeletal:        General: Swelling present. Normal range of motion.     Comments: elbows  Skin:    General: Skin is warm and dry.  Neurological:     Mental Status: He is oriented to person, place, and time.  Psychiatric:        Mood and Affect: Mood normal.        Behavior: Behavior normal.        Thought Content: Thought content normal.        Judgment: Judgment normal.     Assessment & Plan  Kendon was seen today for hypertension and medication refill.  Diagnoses and all orders for this visit:  Primary hypertension -     amLODipine  (NORVASC ) 10 MG tablet; Take 1 tablet (10 mg total) by mouth daily. -     losartan  (COZAAR ) 25 MG tablet; Take 1 tablet (25 mg total) by mouth daily.  Mixed hyperlipidemia -     atorvastatin  (LIPITOR) 40 MG tablet; Take 1 tablet (40 mg total) by mouth daily.  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 -     atorvastatin  (LIPITOR) 40 MG tablet; Take 1 tablet (40 mg total) by mouth daily. -     losartan  (COZAAR ) 25 MG tablet; Take 1 tablet (25 mg total) by mouth daily.      Patient have been counseled extensively about nutrition and exercise. Other issues discussed during this visit include: low cholesterol diet, weight control and daily exercise, foot care, annual eye examinations at Ophthalmology, importance of adherence with medications and regular follow-up. We also discussed long term complications of uncontrolled diabetes and hypertension.   Return in about 6 months (around 07/20/2024).  The patient was given clear instructions to go to ER or return to medical center if symptoms don't improve, worsen or new problems develop. The patient verbalized understanding. The patient was told to call to get lab results if they haven't heard anything in the next week.   This note has been created with Education officer, environmental. Any transcriptional errors are unintentional.   Rosaline SHAUNNA Bohr, NP 01/29/2024, 10:15 PM

## 2024-01-29 ENCOUNTER — Encounter (INDEPENDENT_AMBULATORY_CARE_PROVIDER_SITE_OTHER): Payer: Self-pay | Admitting: Primary Care

## 2024-02-03 DIAGNOSIS — Z419 Encounter for procedure for purposes other than remedying health state, unspecified: Secondary | ICD-10-CM | POA: Diagnosis not present

## 2024-02-05 ENCOUNTER — Other Ambulatory Visit: Payer: Self-pay

## 2024-02-06 ENCOUNTER — Ambulatory Visit: Admitting: Physician Assistant

## 2024-02-07 ENCOUNTER — Telehealth: Payer: Self-pay | Admitting: Internal Medicine

## 2024-02-07 ENCOUNTER — Other Ambulatory Visit: Payer: Self-pay

## 2024-02-07 ENCOUNTER — Other Ambulatory Visit: Payer: Self-pay | Admitting: Internal Medicine

## 2024-02-07 ENCOUNTER — Other Ambulatory Visit: Payer: Self-pay | Admitting: Pharmacy Technician

## 2024-02-07 DIAGNOSIS — M0579 Rheumatoid arthritis with rheumatoid factor of multiple sites without organ or systems involvement: Secondary | ICD-10-CM

## 2024-02-07 DIAGNOSIS — Z79899 Other long term (current) drug therapy: Secondary | ICD-10-CM

## 2024-02-07 MED ORDER — HUMIRA (2 PEN) 40 MG/0.4ML ~~LOC~~ AJKT
40.0000 mg | AUTO-INJECTOR | SUBCUTANEOUS | 0 refills | Status: DC
  Filled 2024-02-07: qty 2, 28d supply, fill #0

## 2024-02-07 NOTE — Telephone Encounter (Signed)
 Patient called stating Dr. Jeannetta referred him to [facility] and he was scheduled for his new patient appointment yesterday, 02/06/24.  Patient states he was having car trouble and missed his appointment.  Patient rescheduled for 08/14/24 which was the next available appointment.  Patient is requesting a referral to another dermatologist because he really needs to be seen ASAP.

## 2024-02-07 NOTE — Progress Notes (Signed)
 Specialty Pharmacy Refill Coordination Note  Dennis Harris is a 58 y.o. male contacted today regarding refills of specialty medication(s) Adalimumab  (Humira  (2 Pen))   Patient requested Delivery   Delivery date: 02/13/24   Verified address: [address] [place]   Medication will be filled on 02/12/24.  This fill date is pending response to refill request from provider. Patient is aware and if they have not received fill by intended date they must follow up with pharmacy.

## 2024-02-07 NOTE — Telephone Encounter (Signed)
 I called patient, referral faxed to Herlene Domino, patient advised to keep appt at [facility] until has new appt scheduled.

## 2024-02-07 NOTE — Telephone Encounter (Signed)
 Last Fill: 11/27/2023  Labs: 10/31/2023 RBC 4.17, Hgb 10.7, Hct 33.5, MCH 25.7, MCHC 31.9, RDW 15.2, Platelets 443, 10/19/2023 Sodium 134, Total Protein 8.4, Albumin 3.5, Globulin 4.9, AG Ratio 0.7  TB Gold: 11/16/2023 Neg    Next Visit: 02/26/2024  Last Visit: 11/16/2023  IK:Myzlfjunpi arthritis involving multiple sites with positive rheumatoid factor   Current Dose per office note 11/16/2023: adalimumab  40 mg subcu q. 14 days   Patient to update labs at upcoming appointment on 02/26/2024  Okay to refill Humira ?

## 2024-02-08 ENCOUNTER — Other Ambulatory Visit: Payer: Self-pay

## 2024-02-09 ENCOUNTER — Other Ambulatory Visit: Payer: Self-pay

## 2024-02-12 ENCOUNTER — Other Ambulatory Visit: Payer: Self-pay

## 2024-02-14 NOTE — Addendum Note (Signed)
 Addended by: DAYNE SHERRY RAMAN on: 02/14/2024 09:55 AM   Modules accepted: Level of Service

## 2024-02-19 NOTE — Progress Notes (Signed)
 Office Visit Note  Patient: Dennis Harris             Date of Birth: 04-11-1966           MRN: [id number]             PCP: Celestia Rosaline SQUIBB, NP Referring: Celestia Rosaline SQUIBB, NP Visit Date: 02/26/2024   Subjective:  Follow-up (Patient would like a refill on prednisone , says he is out but it helps a lot when he is having flares. Patient states that the Humira  is working but not 100%)   Discussed the use of AI scribe software for clinical note transcription with the patient, who gave verbal consent to proceed.  History of Present Illness   Dennis Harris is a 58 y.o. male here for follow up with seropositive rheumatoid arthritis on adalimumab  40 mg subcu q. 14 days, methotrexate  25 mg PO weekly, and folic acid  1 mg daily.    He has been experiencing joint swelling and pain despite being on Humira  for approximately three months now. Last week, his left hand and wrist swelled significantly, causing discomfort and difficulty with mobility. He is unsure if the medication is effective as he continues to experience flare-ups. This improved with taking 2 prednisone  tablets helped after about 8 or 9 hours.  His flare-ups are not daily but occur regularly, affecting his shoulders, wrists, and ankles. He describes difficulty moving his shoulders and pain in his foot and ankle, which limits his ability to walk.  No recent viral illnesses or antibiotic use.       Previous HPI 11/16/2023 Dennis Harris is a 58 y.o. male here for follow up with seropositive rheumatoid arthritis on methotrexate  25 mg PO weekly and folic acid  1 mg daily.   He experiences significant joint pain, particularly in the shoulder and hands, which affects his daily activities. He wakes up with pain and has difficulty moving due to shoulder pain. He also has pain and swelling in the hands, with difficulty bending fingers and performing tasks. He describes the pain as 'wicked' and is 'getting tired of waking  up every morning' with pain.   Prednisone  was helpful when taken for a couple of weeks. He has a history of ankle infection that required surgery, and there is a concern about residual infection. An MRI of the ankle was performed on the 8th of the month, but the results have not yet been read due to a shortage of radiologists in the area.   He has not been sick with anything else recently. He is not currently seeing a cardiologist, although he has been on heart medications in the past. He recalls a discussion about Sim but is unsure about current use.   No personal or strong family history of lymphoma or skin cancer. His sister-in-law had a 'crippling type' of arthritis that severely affected her hand function.         Previous HPI 10/19/2023 Dennis Harris is a 58 year old male with rheumatoid arthritis who presents with persistent joint pain and swelling despite currently taking methotrexate  15 mg p.o. weekly and folic acid  1 mg daily.  After our previous visit he took the prednisone  for 2 weeks with a good improvement in symptoms but started noticing pain and stiffness worsening again within about 1 week after completing this taper.   He experiences persistent joint pain and swelling, particularly in the wrists, shoulders, and hands. Despite being on methotrexate  for nearly two months, his  symptoms have not improved. He initially found relief with prednisone , but symptoms returned about a week after discontinuation.   He is currently taking methotrexate . Swelling is primarily in the wrists, with the right wrist being notably affected. He uses lidocaine  patches for pain management, which provide some relief. He also reports a cyst on his right index finger and nodules on his left elbow, which are not tender.   He describes significant discomfort, particularly in the mornings, stating it takes him a few minutes to move upon waking. He also experiences difficulty getting comfortable in  bed due to pain, especially in his wrists and shoulders.   He recently visited his foot doctor, who noted swelling in his foot, likely related to arthritis. An MRI is scheduled to rule out any lingering infection from a previous surgery. He experiences some soreness in his ankle and foot area. No significant swelling in the elbow. Extensive swelling in the wrist and some soreness in the ankle and foot area.         Previous HPI 08/21/23 Dennis Harris is a 58 y.o. male here for follow up for RA currently off treatment due to a long interval after needing surgery for left ankle septic joint and subsequently was without health insurance until establishing medicaid last November. He presents with worsening joint pain and swelling. They report that their symptoms have been progressively worsening, affecting their mobility and quality of life. The patient describes the pain as intense and affecting multiple joints, including the shoulders, hands, and ankles. They also report morning stiffness, requiring them to take small steps and lean against the wall for support upon waking.   The patient has a history of septic arthritis in the left ankle, which required two surgeries in 2023. Despite the surgeries, the patient reports persistent swelling in the ankle. They also report the development of nodules on their right index finger and elbow, which they initially believed to be cysts.   The patient has been off methotrexate , their previous RA medication, due to issues with Medicaid. They report a significant increase in symptoms since discontinuing the medication. They have been managing their symptoms with ibuprofen , but report that it is no longer effective. They also report a brief period of symptom relief after a short course of prednisone  prescribed by their primary care provider.   12/28/2021 Dennis Harris is a 58 y.o. male here for follow up for  history of RA after starting methotrexate  15 mg  PO weekly. He feels his joint pain and swelling are improved in most areas but the left ankle is not getting any better. He took prednisone  initially but even while on this his ankle did not decrease swelling noticeably. 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   11/22/2021 LATROY GAYMON is a 58 y.o. male here for joint pains he has history of RA treated with methotrexate  and recent ED visits due to flare ups treated with prednisone .  He was diagnosed sometime around 2007 to 2009 with rheumatoid arthritis due to joint inflammation in multiple areas.  He was on oral methotrexate  for years for treatment which had been effective.  Also treated episodically with steroids for active inflammation which was generally beneficial and no major side effects.  Most recent other rheumatology provider with Dr. Ishmael but has not seen her for more than a year and due to loss of insurance  when he lost his previous employment.  As result he has been off any maintenance treatment throughout last year.  He also has a history of degenerative arthritis and AVN and had anterior cervical spine fusion and right hip replacement. He developed skin rashes on his chest years ago saw dermatology a decade or more ago was not thought to represent any significant ongoing process.  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   Labs reviewed 11/2021 HBV neg HCV neg   05/2018 ANA neg RF 168 CCP >250   Review of Systems  Constitutional:  Negative for fatigue.  HENT:  Negative for mouth sores and mouth dryness.   Eyes:  Negative for dryness.  Respiratory:  Negative for shortness of breath.   Cardiovascular:  Negative for chest pain and palpitations.  Gastrointestinal:  Negative for blood in stool, constipation and diarrhea.  Endocrine: Negative for increased urination.  Genitourinary:   Negative for involuntary urination.  Musculoskeletal:  Positive for joint pain, joint pain, joint swelling, myalgias, morning stiffness and myalgias. Negative for gait problem, muscle weakness and muscle tenderness.  Skin:  Negative for color change, rash, hair loss and sensitivity to sunlight.  Allergic/Immunologic: Negative for susceptible to infections.  Neurological:  Negative for dizziness and headaches.  Hematological:  Negative for swollen glands.  Psychiatric/Behavioral:  Positive for sleep disturbance. Negative for depressed mood. The patient is not nervous/anxious.     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   Hyperlipidemia 11/29/2019   Tobacco abuse 11/29/2019   Avascular necrosis of hip, right (HCC) 12/13/2016   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   Avascular necrosis of bones of both hips (HCC) 12/07/2016   Pain of left hip joint 12/07/2016   Pain of right hip joint 12/07/2016   SIRS (systemic inflammatory response syndrome) (HCC) 08/07/2014   Cardiomyopathy- EF 40-45% presumed secondary to acute illness 08/07/2014   CAP (community acquired pneumonia)    Right upper quadrant pain    Acute pericarditis 08/04/2014   Acute hyponatremia 08/04/2014   Protein-calorie malnutrition, severe (HCC) 08/04/2014   Sepsis (HCC) 08/03/2014   Legionella pneumonia (HCC) 08/03/2014   Rheumatoid arthritis (HCC) 08/03/2014   Chest pain  08/03/2014   Elevated troponin- not fel to be secondary to MI 08/03/2014   Nausea vomiting and diarrhea 08/03/2014   Cervical spondylosis without myelopathy 11/06/2012    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   Exertional shortness of breath    at times (11/14/2012)   Hypercholesteremia    Hypertension    Pericarditis ~ 2009   Pneumonia    Rheumatoid arthritis(714.0)    Sepsis (HCC)    in the left ankle    Family History  Problem Relation Age of Onset   Cancer Mother    Hypertension Mother    Stroke Mother    Diabetes Mother  Cirrhosis Father    Lupus Sister    Lupus Sister    Breast cancer Sister    Past Surgical History:  Procedure Laterality Date   ANTERIOR CERVICAL DECOMP/DISCECTOMY FUSION N/A 11/14/2012   Procedure: ANTERIOR CERVICAL DECOMPRESSION/DISCECTOMY FUSION 1 LEVEL C5-6;  Surgeon: Donaciano Sprang, MD;  Location: [facility] OR;  Service: Orthopedics;  Laterality: N/A;   APPENDECTOMY  1970's   BACK SURGERY     CARDIAC CATHETERIZATION  May 2010   normal   I & D EXTREMITY Left 02/02/2022   Procedure: IRRIGATION AND DEBRIDEMENT LEFT FOOT/ANKLE;  Surgeon: Tobie Franky SQUIBB, DPM;  Location: [facility] OR;  Service: Podiatry;  Laterality: Left;   I & D EXTREMITY Left 03/23/2022   Procedure: IRRIGATION AND DEBRIDEMENT EXTREMITY;  Surgeon: Joya Stabs, DPM;  Location: [facility] OR;  Service: Podiatry;  Laterality: Left;   JOINT REPLACEMENT     TONSILLECTOMY  1970's?   TOTAL HIP ARTHROPLASTY Right 12/13/2016   TOTAL HIP ARTHROPLASTY Right 12/13/2016   Procedure: RIGHT TOTAL HIP ARTHROPLASTY ANTERIOR APPROACH;  Surgeon: Vernetta Lonni GRADE, MD;  Location: [facility] OR;  Service: Orthopedics;  Laterality: Right;   Social History   Social History Narrative   Not on file   Immunization History  Administered Date(s) Administered   Influenza,inj,Quad PF,6+ Mos 08/08/2014   PFIZER(Purple Top)SARS-COV-2 Vaccination 10/11/2019, 11/01/2019   PPD  Test 06/18/2018   Pneumococcal Polysaccharide-23 08/08/2014   Tdap 11/15/2016     Objective: Vital Signs: BP (!) 161/84 (BP Location: Right Arm, Patient Position: Sitting, Cuff Size: Normal)   Pulse 80   Resp 16   Ht 5' 10.5 (1.791 m)   Wt 157 lb (71.2 kg)   BMI 22.21 kg/m    Physical Exam Eyes:     Conjunctiva/sclera: Conjunctivae normal.  Cardiovascular:     Rate and Rhythm: Normal rate and regular rhythm.  Pulmonary:     Effort: Pulmonary effort is normal.     Breath sounds: Normal breath sounds.  Musculoskeletal:     Right lower leg: No edema.     Left lower leg: No edema.  Skin:    General: Skin is warm and dry.  Neurological:     Mental Status: He is alert.  Psychiatric:        Mood and Affect: Mood normal.      Musculoskeletal Exam:  Shoulders stiffness in active movement, passive ROM is intact, no focal tenderness to pressure or swelling Left elbow firm, mobile nontender nodule in olecranon bursa, smaller nodule on proximal forearm Wrists with no swelling or tenderness, left worse than right ulnar subluxation  Fingers full ROM no tenderness or swelling Knees full ROM no tenderness or swelling Left ankle no focal tenderness or palpable swelling   Investigation: No additional findings.  Imaging: No results found.  Recent Labs: Lab Results  Component Value Date   WBC 7.2 10/31/2023   HGB 10.7 (L) 10/31/2023   PLT 443 (H) 10/31/2023   NA 134 (L) 10/19/2023   K 4.8 10/19/2023   CL 102 10/19/2023   CO2 23 10/19/2023   GLUCOSE 82 10/19/2023   BUN 8 10/19/2023   CREATININE 0.85 10/19/2023   BILITOT 0.4 10/19/2023   ALKPHOS 142 (H) 07/11/2023   AST 22 10/19/2023   ALT 19 10/19/2023   PROT 8.4 (H) 10/19/2023   ALBUMIN 3.7 (L) 07/11/2023   CALCIUM  9.3 10/19/2023   GFRAA >60 11/18/2019   QFTBGOLDPLUS NEGATIVE 11/16/2023    Speciality Comments: No specialty comments available.  Procedures:  No procedures performed Allergies: Bee venom and  Shellfish allergy   Assessment / Plan:     Visit Diagnoses: Rheumatoid arthritis involving multiple sites with positive rheumatoid factor (HCC) - Plan: Sedimentation rate, predniSONE  (DELTASONE ) 5 MG tablet Rheumatoid arthritis with significant joint damage and recurrent flare-ups, particularly affecting the left wrist, shoulders, and ankles. Current treatment with Humira  and methotrexate  definitely an improvement and he has no active synovitis or dactylitis present on physical exam, but may be still limited response, as evidenced by persistent inflammation and steroid use. Ulnar subluxation in the left wrist poses risk for complications. High sedimentation rate will guide treatment adjustments. Consider Actemra  if sed rate remains elevated longer term although symptoms look good enough today would monitor longer. - Checking sed rate for disease activity monitoring - Provided information on Actemra  (tocilizumab ) for potential future use. - Continue Humira  40 mg subcu q. 14 days - Continue methotrexate  25 mg p.o. weekly folic acid  1 mg daily - New Rx for prednisone  5 mg tablets take up to 1 or 2 daily as needed for flares  High risk medication use - methotrexate  25 mg p.o. weekly and folic acid  1 mg daily, adalimumab  40 mg subcu q. 14 days - Plan: CBC with Differential/Platelet, Comprehensive metabolic panel with GFR, Lipid panel, Hemoglobin A1c Has been tolerating medication without particular injection reactions or other new symptoms.  No serious interval infections.  Discussed possibility if need to change biologic DMARD will also check lipid panel for baseline considering possible Actemra  if we do.  Will also recheck hemoglobin A1c for monitoring while continuing as needed oral prednisone .  Discussed goal of limiting this to just intermittent or getting rid of at some point especially with his history of AVN.    Orders: Orders Placed This Encounter  Procedures   Sedimentation rate   CBC with  Differential/Platelet   Comprehensive metabolic panel with GFR   Lipid panel   Hemoglobin A1c   Meds ordered this encounter  Medications   predniSONE  (DELTASONE ) 5 MG tablet    Sig: Take 1-2 tablets (5-10 mg total) by mouth daily as needed for arthritis flare ups.    Dispense:  30 tablet    Refill:  1     Follow-Up Instructions: Return in about 3 months (around 05/28/2024) for RA on ADA/MTX f/u 3mos.   Lonni LELON Ester, MD  Note - This record has been created using [employer].  Chart creation errors have been sought, but may not always  have been located. Such creation errors do not reflect on  the standard of medical care.

## 2024-02-22 ENCOUNTER — Other Ambulatory Visit: Payer: Self-pay | Admitting: *Deleted

## 2024-02-22 DIAGNOSIS — M0579 Rheumatoid arthritis with rheumatoid factor of multiple sites without organ or systems involvement: Secondary | ICD-10-CM

## 2024-02-22 DIAGNOSIS — M069 Rheumatoid arthritis, unspecified: Secondary | ICD-10-CM

## 2024-02-22 NOTE — Telephone Encounter (Signed)
 Patient requesting prednisone , patient does not think medication is helping.  Last Fill: 11/16/2023  Labs: 10/31/2023  CBC RBC 4.17, Hemoglobin 10.7, HCT 33.5, MCH 31.9, RDW 15.2, Platelets 443, CMP 10/19/2023 Sodium 134, Total Protein 8.4, Albumin 3.5, Globulin 4.9, AG Ratio 0.7  Next Visit: 02/26/2024  Last Visit: 11/16/2023  DX: Rheumatoid arthritis involving multiple sites with positive rheumatoid factor   Current Dose per office note 11/16/2023: methotrexate  25 mg p.o. weekly, Repeat prednisone  taper x 12 days for active disease   Okay to refill Methotrexate ?

## 2024-02-23 ENCOUNTER — Other Ambulatory Visit: Payer: Self-pay

## 2024-02-23 MED ORDER — METHOTREXATE SODIUM 2.5 MG PO TABS
25.0000 mg | ORAL_TABLET | ORAL | 0 refills | Status: DC
  Filled 2024-02-23: qty 40, 28d supply, fill #0

## 2024-02-26 ENCOUNTER — Other Ambulatory Visit (HOSPITAL_COMMUNITY): Payer: Self-pay

## 2024-02-26 ENCOUNTER — Encounter: Payer: Self-pay | Admitting: Internal Medicine

## 2024-02-26 ENCOUNTER — Ambulatory Visit: Attending: Internal Medicine | Admitting: Internal Medicine

## 2024-02-26 ENCOUNTER — Other Ambulatory Visit: Payer: Self-pay

## 2024-02-26 VITALS — BP 161/84 | HR 80 | Resp 16 | Ht 70.5 in | Wt 157.0 lb

## 2024-02-26 DIAGNOSIS — M87052 Idiopathic aseptic necrosis of left femur: Secondary | ICD-10-CM | POA: Diagnosis not present

## 2024-02-26 DIAGNOSIS — M87051 Idiopathic aseptic necrosis of right femur: Secondary | ICD-10-CM | POA: Diagnosis not present

## 2024-02-26 DIAGNOSIS — M0579 Rheumatoid arthritis with rheumatoid factor of multiple sites without organ or systems involvement: Secondary | ICD-10-CM | POA: Insufficient documentation

## 2024-02-26 DIAGNOSIS — I5020 Unspecified systolic (congestive) heart failure: Secondary | ICD-10-CM | POA: Diagnosis not present

## 2024-02-26 DIAGNOSIS — Z79899 Other long term (current) drug therapy: Secondary | ICD-10-CM | POA: Diagnosis not present

## 2024-02-26 DIAGNOSIS — M009 Pyogenic arthritis, unspecified: Secondary | ICD-10-CM | POA: Diagnosis not present

## 2024-02-26 MED ORDER — PREDNISONE 5 MG PO TABS
5.0000 mg | ORAL_TABLET | Freq: Every day | ORAL | 1 refills | Status: DC
  Filled 2024-02-26: qty 30, 15d supply, fill #0

## 2024-02-27 ENCOUNTER — Ambulatory Visit: Payer: Self-pay | Admitting: Internal Medicine

## 2024-02-27 LAB — COMPREHENSIVE METABOLIC PANEL WITH GFR
AG Ratio: 0.8 (calc) — ABNORMAL LOW (ref 1.0–2.5)
ALT: 36 U/L (ref 9–46)
AST: 40 U/L — ABNORMAL HIGH (ref 10–35)
Albumin: 4.1 g/dL (ref 3.6–5.1)
Alkaline phosphatase (APISO): 113 U/L (ref 35–144)
BUN: 8 mg/dL (ref 7–25)
CO2: 23 mmol/L (ref 20–32)
Calcium: 9.5 mg/dL (ref 8.6–10.3)
Chloride: 100 mmol/L (ref 98–110)
Creat: 0.9 mg/dL (ref 0.70–1.30)
Globulin: 5.1 g/dL — ABNORMAL HIGH (ref 1.9–3.7)
Glucose, Bld: 85 mg/dL (ref 65–99)
Potassium: 4.6 mmol/L (ref 3.5–5.3)
Sodium: 131 mmol/L — ABNORMAL LOW (ref 135–146)
Total Bilirubin: 0.4 mg/dL (ref 0.2–1.2)
Total Protein: 9.2 g/dL — ABNORMAL HIGH (ref 6.1–8.1)
eGFR: 98 mL/min/1.73m2 (ref >=60)

## 2024-02-27 LAB — CBC WITH DIFFERENTIAL/PLATELET
Absolute Lymphocytes: 1428 {cells}/uL (ref 850–3900)
Absolute Monocytes: 755 {cells}/uL (ref 200–950)
Basophils Absolute: 71 {cells}/uL (ref 0–200)
Basophils Relative: 1.2 %
Eosinophils Absolute: 18 {cells}/uL (ref 15–500)
Eosinophils Relative: 0.3 %
HCT: 39.5 % (ref 38.5–50.0)
Hemoglobin: 11.8 g/dL — ABNORMAL LOW (ref 13.2–17.1)
MCH: 25.4 pg — ABNORMAL LOW (ref 27.0–33.0)
MCHC: 29.9 g/dL — ABNORMAL LOW (ref 32.0–36.0)
MCV: 84.9 fL (ref 80.0–100.0)
MPV: 9.1 fL (ref 7.5–12.5)
Monocytes Relative: 12.8 %
Neutro Abs: 3629 {cells}/uL (ref 1500–7800)
Neutrophils Relative %: 61.5 %
Platelets: 366 Thousand/uL (ref 140–400)
RBC: 4.65 Million/uL (ref 4.20–5.80)
RDW: 15.1 % — ABNORMAL HIGH (ref 11.0–15.0)
Total Lymphocyte: 24.2 %
WBC: 5.9 Thousand/uL (ref 3.8–10.8)

## 2024-02-27 LAB — HEMOGLOBIN A1C
Hgb A1c MFr Bld: 5 % (ref <5.7)
Mean Plasma Glucose: 97 mg/dL
eAG (mmol/L): 5.4 mmol/L

## 2024-02-27 LAB — LIPID PANEL
Cholesterol: 210 mg/dL — ABNORMAL HIGH (ref <200)
HDL: 53 mg/dL (ref >=40)
LDL Cholesterol (Calc): 131 mg/dL — ABNORMAL HIGH
Non-HDL Cholesterol (Calc): 157 mg/dL — ABNORMAL HIGH (ref <130)
Total CHOL/HDL Ratio: 4 (calc) (ref <5.0)
Triglycerides: 145 mg/dL (ref <150)

## 2024-02-27 LAB — SEDIMENTATION RATE: Sed Rate: 124 mm/h — ABNORMAL HIGH (ref 0–20)

## 2024-02-27 NOTE — Progress Notes (Signed)
 Sedimentation rate remains very highly elevated at 124.  His anemia is slightly better now at 11.8 up from 10.7.  There is a slight increase in liver enzyme test with AST up to 40 which is slightly above the normal range, previously they were normal.  Total cholesterol is a little bit high at 210 with an LDL of 131.  This is not prohibitively high for her medications and he should continue taking the statin prescribed with his PCP.  Based on these results and at our visit I do not think the Humira  is working as well as we would want it to after 3 months.  I would recommend we try switching to the alternative medicine we discussed called Actemra .  If he is interested in pursuing that we can go ahead and start that process.

## 2024-02-29 ENCOUNTER — Telehealth: Payer: Self-pay

## 2024-02-29 DIAGNOSIS — Z79899 Other long term (current) drug therapy: Secondary | ICD-10-CM

## 2024-02-29 DIAGNOSIS — M0579 Rheumatoid arthritis with rheumatoid factor of multiple sites without organ or systems involvement: Secondary | ICD-10-CM

## 2024-02-29 NOTE — Telephone Encounter (Addendum)
 Submitted a Prior Authorization request to [facility] Medicaid for ACTEMRA  SQ via CoverMyMeds. Will update once we receive a response.  Key: [id number]  Sherry Pennant, PharmD, MPH, BCPS, CPP Clinical Pharmacist (Rheumatology and Pulmonology)   ----- Message from Nurse Alfonso DEL sent at 02/27/2024  9:02 AM EDT ----- Per Dr. Jeannetta, please apply for Actemra .

## 2024-03-01 ENCOUNTER — Other Ambulatory Visit (HOSPITAL_COMMUNITY): Payer: Self-pay

## 2024-03-01 MED ORDER — ACTEMRA ACTPEN 162 MG/0.9ML ~~LOC~~ SOAJ
162.0000 mg | SUBCUTANEOUS | 0 refills | Status: DC
  Filled 2024-03-05: qty 1.8, 28d supply, fill #0
  Filled 2024-03-29: qty 1.8, 28d supply, fill #1
  Filled 2024-04-24 – 2024-05-06 (×2): qty 1.8, 28d supply, fill #2

## 2024-03-01 NOTE — Telephone Encounter (Addendum)
 Received notification from [facility] Medicaid regarding a prior authorization for ACTEMRA  SQ. Authorization has been APPROVED from 02/29/2024 to 02/28/2025. Approval letter sent to scan center.  Per test claim, copay for 28 days supply is $4  Patient can fill through [facility]: [phone]   Authorization # [id number]  Rx sent to [facility] for onboarding. Patient able to start at home  Krayton Wortley, PharmD, MPH, BCPS, CPP Clinical Pharmacist (Rheumatology and Pulmonology)

## 2024-03-04 ENCOUNTER — Other Ambulatory Visit: Payer: Self-pay

## 2024-03-05 ENCOUNTER — Other Ambulatory Visit: Payer: Self-pay

## 2024-03-05 ENCOUNTER — Other Ambulatory Visit (HOSPITAL_COMMUNITY): Payer: Self-pay

## 2024-03-05 DIAGNOSIS — Z419 Encounter for procedure for purposes other than remedying health state, unspecified: Secondary | ICD-10-CM | POA: Diagnosis not present

## 2024-03-05 NOTE — Progress Notes (Signed)
 Specialty Pharmacy Initial Fill Coordination Note  Dennis Harris is a 58 y.o. male contacted today regarding initial fill of specialty medication(s) Tocilizumab  (Actemra  ACTPen)   Patient requested Delivery   Delivery date: 03/07/24   Verified address: [address] [place] [address]   Medication will be filled on 03/06/24.   Patient is aware of $4 copayment and would like to continue billing [facility] account.

## 2024-03-07 NOTE — Progress Notes (Signed)
 Patient switching from Humira  to Actemra  due to waning response  Start Actemra  162mg  subcut every 14 days. Continue methotrexate  25mg  po once weekly with folic acid  2mg  daily  Sherry Pennant, PharmD, MPH, BCPS, CPP Clinical Pharmacist (Rheumatology and Pulmonology)

## 2024-03-18 ENCOUNTER — Other Ambulatory Visit: Payer: Self-pay

## 2024-03-18 DIAGNOSIS — M069 Rheumatoid arthritis, unspecified: Secondary | ICD-10-CM

## 2024-03-18 DIAGNOSIS — M0579 Rheumatoid arthritis with rheumatoid factor of multiple sites without organ or systems involvement: Secondary | ICD-10-CM

## 2024-03-18 MED ORDER — METHOTREXATE SODIUM 2.5 MG PO TABS
25.0000 mg | ORAL_TABLET | ORAL | 0 refills | Status: DC
  Filled 2024-03-18: qty 120, 84d supply, fill #0

## 2024-03-18 NOTE — Telephone Encounter (Signed)
 Last Fill: 02/23/2024  Labs: 02/26/2024 Sedimentation rate remains very highly elevated at 124.  His anemia is slightly better now at 11.8 up from 10.7.  There is a slight increase in liver enzyme test with AST up to 40 which is slightly above the normal range, previously they were normal.  Total cholesterol is a little bit high at 210 with an LDL of 131.  This is not prohibitively high for her medications and he should continue taking the statin prescribed with his PCP.   Based on these results and at our visit I do not think the Humira  is working as well as we would want it to after 3 months.  I would recommend we try switching to the alternative medicine we discussed called Actemra .  If he is interested in pursuing that we can go ahead and start that process.   Next Visit: 05/30/2024  Last Visit: 02/26/2024  DX: Rheumatoid arthritis involving multiple sites with positive rheumatoid factor   Current Dose per office note 02/26/2024: methotrexate  25 mg p.o. weekly   Okay to refill Methotrexate ?

## 2024-03-19 ENCOUNTER — Ambulatory Visit: Attending: Cardiovascular Disease | Admitting: Cardiovascular Disease

## 2024-03-22 ENCOUNTER — Encounter: Payer: Self-pay | Admitting: Cardiovascular Disease

## 2024-03-26 ENCOUNTER — Other Ambulatory Visit: Payer: Self-pay

## 2024-03-28 ENCOUNTER — Other Ambulatory Visit: Payer: Self-pay

## 2024-03-29 ENCOUNTER — Other Ambulatory Visit: Payer: Self-pay

## 2024-03-29 NOTE — Progress Notes (Signed)
 Specialty Pharmacy Refill Coordination Note  Dennis Harris is a 58 y.o. male contacted today regarding refills of specialty medication(s) Tocilizumab  (Actemra  ACTPen)   Patient requested Delivery   Delivery date: 04/02/24   Verified address: [address] [place]  [place] [address]   Medication will be filled on 04/01/24.

## 2024-04-01 ENCOUNTER — Other Ambulatory Visit: Payer: Self-pay

## 2024-04-05 DIAGNOSIS — Z419 Encounter for procedure for purposes other than remedying health state, unspecified: Secondary | ICD-10-CM | POA: Diagnosis not present

## 2024-04-22 ENCOUNTER — Other Ambulatory Visit: Payer: Self-pay

## 2024-04-22 ENCOUNTER — Ambulatory Visit (INDEPENDENT_AMBULATORY_CARE_PROVIDER_SITE_OTHER): Admitting: Primary Care

## 2024-04-24 ENCOUNTER — Other Ambulatory Visit: Payer: Self-pay

## 2024-05-02 ENCOUNTER — Other Ambulatory Visit: Payer: Self-pay

## 2024-05-04 ENCOUNTER — Encounter (INDEPENDENT_AMBULATORY_CARE_PROVIDER_SITE_OTHER): Payer: Self-pay

## 2024-05-06 ENCOUNTER — Other Ambulatory Visit: Payer: Self-pay

## 2024-05-06 NOTE — Progress Notes (Signed)
 Specialty Pharmacy Refill Coordination Note  Dennis Harris is a 58 y.o. male contacted today regarding refills of specialty medication(s) Tocilizumab  (Actemra  ACTPen)   Patient requested Delivery   Delivery date: 05/08/24   Verified address: [address] [place]  [address] [place]  [address]   Medication will be filled on 05/07/24.

## 2024-05-09 ENCOUNTER — Ambulatory Visit (INDEPENDENT_AMBULATORY_CARE_PROVIDER_SITE_OTHER): Admitting: Primary Care

## 2024-05-16 NOTE — Progress Notes (Deleted)
 Office Visit Note  Patient: Dennis Harris             Date of Birth: [birth year]           MRN: [id number]             PCP: Celestia Rosaline SQUIBB, NP Referring: Celestia Rosaline SQUIBB, NP Visit Date: 05/30/2024   Subjective:  No chief complaint on file.   History of Present Illness: Dennis Harris is a 58 y.o. male here for follow up with seropositive rheumatoid arthritis on adalimumab  40 mg subcu q. 14 days, methotrexate  25 mg PO weekly, and folic acid  1 mg daily.     Previous HPI 02/26/2024 Dennis Harris is a 58 y.o. male here for follow up with seropositive rheumatoid arthritis on adalimumab  40 mg subcu q. 14 days, methotrexate  25 mg PO weekly, and folic acid  1 mg daily.     He has been experiencing joint swelling and pain despite being on Humira  for approximately three months now. Last week, his left hand and wrist swelled significantly, causing discomfort and difficulty with mobility. He is unsure if the medication is effective as he continues to experience flare-ups. This improved with taking 2 prednisone  tablets helped after about 8 or 9 hours.   His flare-ups are not daily but occur regularly, affecting his shoulders, wrists, and ankles. He describes difficulty moving his shoulders and pain in his foot and ankle, which limits his ability to walk.   No recent viral illnesses or antibiotic use.         Previous HPI 11/16/2023 Dennis Harris is a 58 y.o. male here for follow up with seropositive rheumatoid arthritis on methotrexate  25 mg PO weekly and folic acid  1 mg daily.   He experiences significant joint pain, particularly in the shoulder and hands, which affects his daily activities. He wakes up with pain and has difficulty moving due to shoulder pain. He also has pain and swelling in the hands, with difficulty bending fingers and performing tasks. He describes the pain as 'wicked' and is 'getting tired of waking up every morning' with pain.   Prednisone   was helpful when taken for a couple of weeks. He has a history of ankle infection that required surgery, and there is a concern about residual infection. An MRI of the ankle was performed on the 8th of the month, but the results have not yet been read due to a shortage of radiologists in the area.   He has not been sick with anything else recently. He is not currently seeing a cardiologist, although he has been on heart medications in the past. He recalls a discussion about Sim but is unsure about current use.   No personal or strong family history of lymphoma or skin cancer. His sister-in-law had a 'crippling type' of arthritis that severely affected her hand function.         Previous HPI 10/19/2023 Dennis Harris is a 58 year old male with rheumatoid arthritis who presents with persistent joint pain and swelling despite currently taking methotrexate  15 mg p.o. weekly and folic acid  1 mg daily.  After our previous visit he took the prednisone  for 2 weeks with a good improvement in symptoms but started noticing pain and stiffness worsening again within about 1 week after completing this taper.   He experiences persistent joint pain and swelling, particularly in the wrists, shoulders, and hands. Despite being on methotrexate  for nearly two months, his symptoms have not  improved. He initially found relief with prednisone , but symptoms returned about a week after discontinuation.   He is currently taking methotrexate . Swelling is primarily in the wrists, with the right wrist being notably affected. He uses lidocaine  patches for pain management, which provide some relief. He also reports a cyst on his right index finger and nodules on his left elbow, which are not tender.   He describes significant discomfort, particularly in the mornings, stating it takes him a few minutes to move upon waking. He also experiences difficulty getting comfortable in bed due to pain, especially in his wrists and  shoulders.   He recently visited his foot doctor, who noted swelling in his foot, likely related to arthritis. An MRI is scheduled to rule out any lingering infection from a previous surgery. He experiences some soreness in his ankle and foot area. No significant swelling in the elbow. Extensive swelling in the wrist and some soreness in the ankle and foot area.         Previous HPI 08/21/23 Dennis Harris is a 58 y.o. male here for follow up for RA currently off treatment due to a long interval after needing surgery for left ankle septic joint and subsequently was without health insurance until establishing medicaid last November. He presents with worsening joint pain and swelling. They report that their symptoms have been progressively worsening, affecting their mobility and quality of life. The patient describes the pain as intense and affecting multiple joints, including the shoulders, hands, and ankles. They also report morning stiffness, requiring them to take small steps and lean against the wall for support upon waking.   The patient has a history of septic arthritis in the left ankle, which required two surgeries in 2023. Despite the surgeries, the patient reports persistent swelling in the ankle. They also report the development of nodules on their right index finger and elbow, which they initially believed to be cysts.   The patient has been off methotrexate , their previous RA medication, due to issues with Medicaid. They report a significant increase in symptoms since discontinuing the medication. They have been managing their symptoms with ibuprofen , but report that it is no longer effective. They also report a brief period of symptom relief after a short course of prednisone  prescribed by their primary care provider.   12/28/2021 Dennis Harris is a 58 y.o. male here for follow up for  history of RA after starting methotrexate  15 mg PO weekly. He feels his joint pain and  swelling are improved in most areas but the left ankle is not getting any better. He took prednisone  initially but even while on this his ankle did not decrease swelling noticeably. 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   11/22/2021 Dennis Harris is a 58 y.o. male here for joint pains he has history of RA treated with methotrexate  and recent ED visits due to flare ups treated with prednisone .  He was diagnosed sometime around 2007 to 2009 with rheumatoid arthritis due to joint inflammation in multiple areas.  He was on oral methotrexate  for years for treatment which had been effective.  Also treated episodically with steroids for active inflammation which was generally beneficial and no major side effects.  Most recent other rheumatology provider with Dr. Ishmael but has not seen her for more than a year and due to loss of insurance when he lost  his previous employment.  As result he has been off any maintenance treatment throughout last year.  He also has a history of degenerative arthritis and AVN and had anterior cervical spine fusion and right hip replacement. He developed skin rashes on his chest years ago saw dermatology a decade or more ago was not thought to represent any significant ongoing process.  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   Labs reviewed 11/2021 HBV neg HCV neg   05/2018 ANA neg RF 168 CCP >250   No Rheumatology ROS completed.   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   Hyperlipidemia 11/29/2019   Tobacco abuse 11/29/2019   Avascular necrosis of hip, right (HCC) 12/13/2016   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   Avascular necrosis of bones of both hips (HCC) 12/07/2016   Pain of left hip joint 12/07/2016   Pain of right hip joint 12/07/2016   SIRS (systemic inflammatory response syndrome) (HCC) 08/07/2014   Cardiomyopathy- EF 40-45% presumed secondary to acute illness 08/07/2014   CAP (community acquired pneumonia)    Right upper quadrant pain    Acute pericarditis 08/04/2014   Acute hyponatremia 08/04/2014   Protein-calorie malnutrition, severe 08/04/2014   Sepsis (HCC) 08/03/2014   Legionella pneumonia (HCC) 08/03/2014   Rheumatoid arthritis (HCC) 08/03/2014   Chest pain 08/03/2014   Elevated troponin- not fel to be secondary to MI 08/03/2014   Nausea vomiting and diarrhea 08/03/2014   Cervical spondylosis without myelopathy 11/06/2012    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   Exertional shortness of breath    at times (11/14/2012)   Hypercholesteremia    Hypertension    Pericarditis ~ 2009   Pneumonia    Rheumatoid arthritis(714.0)    Sepsis (HCC)    in the left ankle    Family History  Problem Relation Age of Onset   Cancer Mother    Hypertension Mother    Stroke Mother    Diabetes Mother    Cirrhosis Father    Lupus Sister    Lupus Sister    Breast cancer Sister    Past Surgical History:  Procedure Laterality Date   ANTERIOR CERVICAL DECOMP/DISCECTOMY FUSION N/A 11/14/2012   Procedure: ANTERIOR CERVICAL DECOMPRESSION/DISCECTOMY FUSION 1 LEVEL C5-6;  Surgeon: Donaciano Sprang, MD;  Location: [facility] OR;  Service: Orthopedics;  Laterality: N/A;   APPENDECTOMY  1970's   BACK SURGERY      CARDIAC CATHETERIZATION  May 2010   normal   I & D EXTREMITY Left 02/02/2022   Procedure: IRRIGATION AND DEBRIDEMENT LEFT FOOT/ANKLE;  Surgeon: Tobie Franky SQUIBB, DPM;  Location: [facility] OR;  Service: Podiatry;  Laterality: Left;   I & D EXTREMITY Left 03/23/2022   Procedure: IRRIGATION AND DEBRIDEMENT EXTREMITY;  Surgeon: Joya Stabs, DPM;  Location: [facility] OR;  Service: Podiatry;  Laterality: Left;  JOINT REPLACEMENT     TONSILLECTOMY  1970's?   TOTAL HIP ARTHROPLASTY Right 12/13/2016   TOTAL HIP ARTHROPLASTY Right 12/13/2016   Procedure: RIGHT TOTAL HIP ARTHROPLASTY ANTERIOR APPROACH;  Surgeon: Vernetta Lonni GRADE, MD;  Location: [facility] OR;  Service: Orthopedics;  Laterality: Right;   Social History   Social History Narrative   Not on file   Immunization History  Administered Date(s) Administered   Influenza,inj,Quad PF,6+ Mos 08/08/2014   PFIZER(Purple Top)SARS-COV-2 Vaccination 10/11/2019, 11/01/2019   PPD Test 06/18/2018   Pneumococcal Polysaccharide-23 08/08/2014   Tdap 11/15/2016     Objective: Vital Signs: There were no vitals taken for this visit.   Physical Exam   Musculoskeletal Exam: ***  CDAI Exam: CDAI Score: -- Patient Global: --; Provider Global: -- Swollen: --; Tender: -- Joint Exam 05/30/2024   No joint exam has been documented for this visit   There is currently no information documented on the homunculus. Go to the Rheumatology activity and complete the homunculus joint exam.  Investigation: No additional findings.  Imaging: No results found.  Recent Labs: Lab Results  Component Value Date   WBC 5.9 02/26/2024   HGB 11.8 (L) 02/26/2024   PLT 366 02/26/2024   NA 131 (L) 02/26/2024   K 4.6 02/26/2024   CL 100 02/26/2024   CO2 23 02/26/2024   GLUCOSE 85 02/26/2024   BUN 8 02/26/2024   CREATININE 0.90 02/26/2024   BILITOT 0.4 02/26/2024   ALKPHOS 142 (H) 07/11/2023   AST 40 (H) 02/26/2024   ALT 36 02/26/2024   PROT 9.2 (H) 02/26/2024    ALBUMIN 3.7 (L) 07/11/2023   CALCIUM  9.5 02/26/2024   GFRAA >60 11/18/2019   QFTBGOLDPLUS NEGATIVE 11/16/2023    Speciality Comments: No specialty comments available.  Procedures:  No procedures performed Allergies: Bee venom and Shellfish allergy   Assessment / Plan:     Visit Diagnoses: No diagnosis found.  ***  Orders: No orders of the defined types were placed in this encounter.  No orders of the defined types were placed in this encounter.    Follow-Up Instructions: No follow-ups on file.   Fouad Taul M Katelen Luepke, CMA  Note - This record has been created using Animal nutritionist.  Chart creation errors have been sought, but may not always  have been located. Such creation errors do not reflect on  the standard of medical care.

## 2024-05-30 ENCOUNTER — Other Ambulatory Visit: Payer: Self-pay | Admitting: Internal Medicine

## 2024-05-30 ENCOUNTER — Other Ambulatory Visit: Payer: Self-pay

## 2024-05-30 ENCOUNTER — Ambulatory Visit: Admitting: Internal Medicine

## 2024-05-30 DIAGNOSIS — Z79899 Other long term (current) drug therapy: Secondary | ICD-10-CM

## 2024-05-30 DIAGNOSIS — M0579 Rheumatoid arthritis with rheumatoid factor of multiple sites without organ or systems involvement: Secondary | ICD-10-CM

## 2024-05-30 NOTE — Telephone Encounter (Signed)
 Last Fill: 03/01/2024  Labs: 02/26/2024 Sedimentation rate remains very highly elevated at 124.  His anemia is slightly better now at 11.8 up from 10.7.  There is a slight increase in liver enzyme test with AST up to 40 which is slightly above the normal range, previously they were normal.  Total cholesterol is a little bit high at 210 with an LDL of 131.  This is not prohibitively high for her medications and he should continue taking the statin prescribed with his PCP.   Based on these results and at our visit I do not think the Humira  is working as well as we would want it to after 3 months.  I would recommend we try switching to the alternative medicine we discussed called Actemra   TB Gold: 11/16/2023 Neg    Next Visit: 08/08/2024  Last Visit: 02/26/2024  IK:Myzlfjunpi arthritis involving multiple sites with positive rheumatoid factor   Current Dose per office note 02/26/2024: dose not discussed  Patient advised he is due to update his labs. Patient states he will come tomorrow or Monday morning for labs.   Okay to refill Actemra ?

## 2024-05-31 ENCOUNTER — Other Ambulatory Visit: Payer: Self-pay

## 2024-05-31 MED ORDER — ACTEMRA ACTPEN 162 MG/0.9ML ~~LOC~~ SOAJ
162.0000 mg | SUBCUTANEOUS | 0 refills | Status: DC
  Filled 2024-05-31 – 2024-06-04 (×2): qty 1.8, 28d supply, fill #0

## 2024-06-04 ENCOUNTER — Other Ambulatory Visit: Payer: Self-pay

## 2024-06-04 ENCOUNTER — Other Ambulatory Visit: Payer: Self-pay | Admitting: Pharmacy Technician

## 2024-06-04 NOTE — Progress Notes (Signed)
 Specialty Pharmacy Refill Coordination Note  Dennis Harris is a 58 y.o. male contacted today regarding refills of specialty medication(s) Tocilizumab  (Actemra  ACTPen)   Patient requested Delivery   Delivery date: 06/06/24   Verified address: [address] [place]   Medication will be filled on: 06/05/24

## 2024-06-05 ENCOUNTER — Ambulatory Visit (INDEPENDENT_AMBULATORY_CARE_PROVIDER_SITE_OTHER): Admitting: Primary Care

## 2024-06-05 DIAGNOSIS — Z419 Encounter for procedure for purposes other than remedying health state, unspecified: Secondary | ICD-10-CM | POA: Diagnosis not present

## 2024-06-12 ENCOUNTER — Other Ambulatory Visit: Payer: Self-pay

## 2024-06-17 ENCOUNTER — Ambulatory Visit (INDEPENDENT_AMBULATORY_CARE_PROVIDER_SITE_OTHER): Admitting: Primary Care

## 2024-06-19 ENCOUNTER — Other Ambulatory Visit (HOSPITAL_COMMUNITY): Payer: Self-pay

## 2024-06-26 ENCOUNTER — Other Ambulatory Visit: Payer: Self-pay | Admitting: Internal Medicine

## 2024-06-26 ENCOUNTER — Other Ambulatory Visit (HOSPITAL_COMMUNITY): Payer: Self-pay

## 2024-06-26 DIAGNOSIS — M0579 Rheumatoid arthritis with rheumatoid factor of multiple sites without organ or systems involvement: Secondary | ICD-10-CM

## 2024-06-26 DIAGNOSIS — Z79899 Other long term (current) drug therapy: Secondary | ICD-10-CM

## 2024-06-28 ENCOUNTER — Other Ambulatory Visit: Payer: Self-pay

## 2024-07-02 ENCOUNTER — Other Ambulatory Visit (HOSPITAL_COMMUNITY): Payer: Self-pay

## 2024-07-05 ENCOUNTER — Other Ambulatory Visit: Payer: Self-pay | Admitting: *Deleted

## 2024-07-05 ENCOUNTER — Other Ambulatory Visit: Payer: Self-pay

## 2024-07-05 ENCOUNTER — Other Ambulatory Visit: Payer: Self-pay | Admitting: Internal Medicine

## 2024-07-05 DIAGNOSIS — Z79899 Other long term (current) drug therapy: Secondary | ICD-10-CM

## 2024-07-05 DIAGNOSIS — Z419 Encounter for procedure for purposes other than remedying health state, unspecified: Secondary | ICD-10-CM | POA: Diagnosis not present

## 2024-07-05 DIAGNOSIS — M069 Rheumatoid arthritis, unspecified: Secondary | ICD-10-CM

## 2024-07-05 DIAGNOSIS — M0579 Rheumatoid arthritis with rheumatoid factor of multiple sites without organ or systems involvement: Secondary | ICD-10-CM

## 2024-07-05 MED ORDER — METHOTREXATE SODIUM 2.5 MG PO TABS
25.0000 mg | ORAL_TABLET | ORAL | 0 refills | Status: AC
  Filled 2024-07-05 – 2024-07-09 (×3): qty 120, 84d supply, fill #0

## 2024-07-05 MED ORDER — ACTEMRA ACTPEN 162 MG/0.9ML ~~LOC~~ SOAJ
162.0000 mg | SUBCUTANEOUS | 0 refills | Status: AC
  Filled 2024-07-05: qty 5.4, 84d supply, fill #0

## 2024-07-05 MED ORDER — PREDNISONE 5 MG PO TABS
5.0000 mg | ORAL_TABLET | Freq: Every day | ORAL | 1 refills | Status: AC
  Filled 2024-07-05 – 2024-07-09 (×3): qty 30, 15d supply, fill #0

## 2024-07-05 NOTE — Telephone Encounter (Signed)
 Patient contacted the office to request medication refills.     1. Name of Medication: Methotrexate    2. How are you currently taking this medication (dosage and times per day)? 25 mg once a week   3. What pharmacy would you like for that to be sent to? [facility]   1. Name of Medication: Prednisone   2. How are you currently taking this medication (dosage and times per day)? 1-2 tablets daily as needed   3. What pharmacy would you like for that to be sent to? [facility]

## 2024-07-05 NOTE — Telephone Encounter (Signed)
 Last Fill: 02/26/2024 (Prednisone ), 03/18/2024 (MTX)   Labs: 02/26/2024 Sedimentation rate remains very highly elevated at 124. His anemia is slightly better now at 11.8 up from 10.7. There is a slight increase in liver enzyme test with AST up to 40 which is slightly above the normal range, previously they were normal. Total cholesterol is a little bit high at 210 with an LDL of 131    TB Gold: 11/16/2023 Neg     Next Visit: 08/08/2024   Last Visit: 02/26/2024   IK:Myzlfjunpi arthritis involving multiple sites with positive rheumatoid factor   Patient updated labs today in office.    Current Dose per office note 02/26/2024: methotrexate  25 mg p.o. weekly folic acid  1 mg daily prednisone  5 mg tablets take up to 1 or 2 daily as needed for flares   Okay to refill Prednisone  and MTX?

## 2024-07-05 NOTE — Telephone Encounter (Signed)
 Patient contacted the office to request a 3 medication refills.     1. Name of Medication: Methotrexate   2. How are you currently taking this medication (dosage and times per day)? 25 mg once a week   3. What pharmacy would you like for that to be sent to? [facility]  1. Name of Medication: Prednisone   2. How are you currently taking this medication (dosage and times per day)? 1-2 tablets daily as needed   3. What pharmacy would you like for that to be sent to? [facility]    1. Name of Medication: Actemra  Actpen  2. How are you currently taking this medication (dosage and times per day)? 1 injection every 14 days   3. What pharmacy would you like for that to be sent to? [facility]   Patient states he is out of all three medications.

## 2024-07-05 NOTE — Telephone Encounter (Signed)
 Last Fill: 05/31/2024 (30 day supply)  Labs: 02/26/2024 Sedimentation rate remains very highly elevated at 124. His anemia is slightly better now at 11.8 up from 10.7. There is a slight increase in liver enzyme test with AST up to 40 which is slightly above the normal range, previously they were normal. Total cholesterol is a little bit high at 210 with an LDL of 131   TB Gold: 11/16/2023 Neg    Next Visit: 08/08/2024  Last Visit: 02/26/2024  IK:Myzlfjunpi arthritis involving multiple sites with positive rheumatoid factor   Current Dose per office note 02/26/2024: not discussed  Patient updated labs today.   Okay to refill Actemra ?

## 2024-07-05 NOTE — Progress Notes (Signed)
 Specialty Pharmacy Refill Coordination Note  Dennis Harris is a 58 y.o. male contacted today regarding refills of specialty medication(s) Tocilizumab  (Actemra  ACTPen)   Patient requested Delivery   Delivery date: 07/09/24   Verified address: [address] [place], [address]   Medication will be filled on: 07/08/24

## 2024-07-06 LAB — COMPREHENSIVE METABOLIC PANEL WITH GFR
AG Ratio: 0.9 (calc) — ABNORMAL LOW (ref 1.0–2.5)
ALT: 25 U/L (ref 9–46)
AST: 33 U/L (ref 10–35)
Albumin: 3.8 g/dL (ref 3.6–5.1)
Alkaline phosphatase (APISO): 96 U/L (ref 35–144)
BUN: 12 mg/dL (ref 7–25)
CO2: 21 mmol/L (ref 20–32)
Calcium: 9.2 mg/dL (ref 8.6–10.3)
Chloride: 102 mmol/L (ref 98–110)
Creat: 0.98 mg/dL (ref 0.70–1.30)
Globulin: 4.4 g/dL — ABNORMAL HIGH (ref 1.9–3.7)
Glucose, Bld: 78 mg/dL (ref 65–99)
Potassium: 4.6 mmol/L (ref 3.5–5.3)
Sodium: 133 mmol/L — ABNORMAL LOW (ref 135–146)
Total Bilirubin: 0.3 mg/dL (ref 0.2–1.2)
Total Protein: 8.2 g/dL — ABNORMAL HIGH (ref 6.1–8.1)
eGFR: 89 mL/min/1.73m2 (ref >=60)

## 2024-07-06 LAB — CBC WITH DIFFERENTIAL/PLATELET
Absolute Lymphocytes: 1518 {cells}/uL (ref 850–3900)
Absolute Monocytes: 498 {cells}/uL (ref 200–950)
Basophils Absolute: 38 {cells}/uL (ref 0–200)
Basophils Relative: 0.6 %
Eosinophils Absolute: 50 {cells}/uL (ref 15–500)
Eosinophils Relative: 0.8 %
HCT: 31.9 % — ABNORMAL LOW (ref 39.4–51.1)
Hemoglobin: 9.9 g/dL — ABNORMAL LOW (ref 13.2–17.1)
MCH: 26.1 pg — ABNORMAL LOW (ref 27.0–33.0)
MCHC: 31 g/dL — ABNORMAL LOW (ref 31.6–35.4)
MCV: 84.2 fL (ref 81.4–101.7)
MPV: 8.9 fL (ref 7.5–12.5)
Monocytes Relative: 7.9 %
Neutro Abs: 4196 {cells}/uL (ref 1500–7800)
Neutrophils Relative %: 66.6 %
Platelets: 442 Thousand/uL — ABNORMAL HIGH (ref 140–400)
RBC: 3.79 Million/uL — ABNORMAL LOW (ref 4.20–5.80)
RDW: 15.2 % — ABNORMAL HIGH (ref 11.0–15.0)
Total Lymphocyte: 24.1 %
WBC: 6.3 Thousand/uL (ref 3.8–10.8)

## 2024-07-08 ENCOUNTER — Other Ambulatory Visit: Payer: Self-pay

## 2024-07-09 ENCOUNTER — Other Ambulatory Visit: Payer: Self-pay

## 2024-07-29 NOTE — Progress Notes (Signed)
 "  Office Visit Note  Patient: Dennis Harris             Date of Birth: [birth year]           MRN: [id number]             PCP: Celestia Rosaline SQUIBB, NP Referring: Celestia Rosaline SQUIBB, NP Visit Date: 08/08/2024   Subjective:   Discussed the use of AI scribe software for clinical note transcription with the patient, who gave verbal consent to proceed.  History of Present Illness   Dennis Harris is a 59 y.o. male here for follow up with seropositive rheumatoid arthritis now on Actemra  162 mg subcu q. 14 days, methotrexate  25 mg PO weekly, and folic acid  1 mg daily.   He continues to take methotrexate  every Monday and Actemra  shots every other Monday. He estimates needing prednisone  5-10mg  about once a week or every couple of weeks, taking one or two tablets along with ibuprofen .  He experiences occasional swelling and difficulty curling his hands, but no other specific joints are mentioned as problematic. Nodules on his hand and elbow remain about the same as before but not painful. He has not been sick recently and denies any recent illnesses.      Previous HPI 02/26/2024 Dennis Harris is a 60 y.o. male here for follow up with seropositive rheumatoid arthritis on adalimumab  40 mg subcu q. 14 days, methotrexate  25 mg PO weekly, and folic acid  1 mg daily.     He has been experiencing joint swelling and pain despite being on Humira  for approximately three months now. Last week, his left hand and wrist swelled significantly, causing discomfort and difficulty with mobility. He is unsure if the medication is effective as he continues to experience flare-ups. This improved with taking 2 prednisone  tablets helped after about 8 or 9 hours.   His flare-ups are not daily but occur regularly, affecting his shoulders, wrists, and ankles. He describes difficulty moving his shoulders and pain in his foot and ankle, which limits his ability to walk.   No recent viral illnesses or antibiotic  use.      Previous HPI 11/16/2023 Dennis Harris is a 59 y.o. male here for follow up with seropositive rheumatoid arthritis on methotrexate  25 mg PO weekly and folic acid  1 mg daily.   He experiences significant joint pain, particularly in the shoulder and hands, which affects his daily activities. He wakes up with pain and has difficulty moving due to shoulder pain. He also has pain and swelling in the hands, with difficulty bending fingers and performing tasks. He describes the pain as 'wicked' and is 'getting tired of waking up every morning' with pain.   Prednisone  was helpful when taken for a couple of weeks. He has a history of ankle infection that required surgery, and there is a concern about residual infection. An MRI of the ankle was performed on the 8th of the month, but the results have not yet been read due to a shortage of radiologists in the area.   He has not been sick with anything else recently. He is not currently seeing a cardiologist, although he has been on heart medications in the past. He recalls a discussion about Sim but is unsure about current use.   No personal or strong family history of lymphoma or skin cancer. His sister-in-law had a 'crippling type' of arthritis that severely affected her hand function.  Previous HPI 10/19/2023 Dennis Harris is a 59 year old male with rheumatoid arthritis who presents with persistent joint pain and swelling despite currently taking methotrexate  15 mg p.o. weekly and folic acid  1 mg daily.  After our previous visit he took the prednisone  for 2 weeks with a good improvement in symptoms but started noticing pain and stiffness worsening again within about 1 week after completing this taper.   He experiences persistent joint pain and swelling, particularly in the wrists, shoulders, and hands. Despite being on methotrexate  for nearly two months, his symptoms have not improved. He initially found relief with  prednisone , but symptoms returned about a week after discontinuation.   He is currently taking methotrexate . Swelling is primarily in the wrists, with the right wrist being notably affected. He uses lidocaine  patches for pain management, which provide some relief. He also reports a cyst on his right index finger and nodules on his left elbow, which are not tender.   He describes significant discomfort, particularly in the mornings, stating it takes him a few minutes to move upon waking. He also experiences difficulty getting comfortable in bed due to pain, especially in his wrists and shoulders.   He recently visited his foot doctor, who noted swelling in his foot, likely related to arthritis. An MRI is scheduled to rule out any lingering infection from a previous surgery. He experiences some soreness in his ankle and foot area. No significant swelling in the elbow. Extensive swelling in the wrist and some soreness in the ankle and foot area.         Previous HPI 08/21/23 Dennis Harris is a 59 y.o. male here for follow up for RA currently off treatment due to a long interval after needing surgery for left ankle septic joint and subsequently was without health insurance until establishing medicaid last November. He presents with worsening joint pain and swelling. They report that their symptoms have been progressively worsening, affecting their mobility and quality of life. The patient describes the pain as intense and affecting multiple joints, including the shoulders, hands, and ankles. They also report morning stiffness, requiring them to take small steps and lean against the wall for support upon waking.   The patient has a history of septic arthritis in the left ankle, which required two surgeries in 2023. Despite the surgeries, the patient reports persistent swelling in the ankle. They also report the development of nodules on their right index finger and elbow, which they initially believed  to be cysts.   The patient has been off methotrexate , their previous RA medication, due to issues with Medicaid. They report a significant increase in symptoms since discontinuing the medication. They have been managing their symptoms with ibuprofen , but report that it is no longer effective. They also report a brief period of symptom relief after a short course of prednisone  prescribed by their primary care provider.   12/28/2021 KHAMARION BJELLAND is a 59 y.o. male here for follow up for  history of RA after starting methotrexate  15 mg PO weekly. He feels his joint pain and swelling are improved in most areas but the left ankle is not getting any better. He took prednisone  initially but even while on this his ankle did not decrease swelling noticeably. 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   11/22/2021 Evalene LITTIE Riley is a 59 y.o. male here for joint  pains he has history of RA treated with methotrexate  and recent ED visits due to flare ups treated with prednisone .  He was diagnosed sometime around 2007 to 2009 with rheumatoid arthritis due to joint inflammation in multiple areas.  He was on oral methotrexate  for years for treatment which had been effective.  Also treated episodically with steroids for active inflammation which was generally beneficial and no major side effects.  Most recent other rheumatology provider with Dr. Ishmael but has not seen her for more than a year and due to loss of insurance when he lost his previous employment.  As result he has been off any maintenance treatment throughout last year.  He also has a history of degenerative arthritis and AVN and had anterior cervical spine fusion and right hip replacement. He developed skin rashes on his chest years ago saw dermatology a decade or more ago was not thought to represent any significant ongoing process.  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   Labs reviewed 11/2021 HBV neg HCV neg   05/2018 ANA neg RF 168 CCP >250      Review of Systems  Constitutional:  Negative for fatigue.  HENT:  Negative for mouth sores and mouth dryness.   Eyes:  Negative for dryness.  Respiratory:  Negative for shortness of breath.   Cardiovascular:  Negative for chest pain and palpitations.  Gastrointestinal:  Negative for blood in stool, constipation and diarrhea.  Endocrine: Negative for increased urination.  Genitourinary:  Negative for involuntary urination.  Musculoskeletal:  Positive for morning stiffness. Negative for joint pain, gait problem, joint pain, joint swelling, myalgias, muscle weakness, muscle tenderness and myalgias.  Skin:  Negative for color change, rash and sensitivity to sunlight.  Allergic/Immunologic: Negative for susceptible to infections.  Neurological:  Negative for dizziness and headaches.  Hematological:  Negative for swollen glands.  Psychiatric/Behavioral:  Positive for sleep disturbance. Negative for depressed mood. The patient is not nervous/anxious.     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   Hyperlipidemia 11/29/2019   Tobacco abuse 11/29/2019   Avascular necrosis of hip, right (HCC) 12/13/2016   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   Avascular necrosis of bones of both hips (HCC) 12/07/2016    Pain of left hip joint 12/07/2016   Pain of right hip joint 12/07/2016   SIRS (systemic inflammatory response syndrome) (HCC) 08/07/2014   Cardiomyopathy- EF 40-45% presumed secondary to acute illness 08/07/2014   CAP (community acquired pneumonia)    Right upper quadrant pain    Acute pericarditis 08/04/2014   Acute hyponatremia 08/04/2014   Protein-calorie malnutrition, severe 08/04/2014   Sepsis (HCC) 08/03/2014   Legionella pneumonia (HCC) 08/03/2014   Rheumatoid arthritis (HCC) 08/03/2014   Chest pain 08/03/2014   Elevated troponin- not fel to be secondary to MI 08/03/2014   Nausea vomiting and diarrhea 08/03/2014   Cervical spondylosis without myelopathy 11/06/2012    Past  Medical History:  Diagnosis Date   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   Exertional shortness of breath    at times (11/14/2012)   Hypercholesteremia    Hypertension    Pericarditis ~ 2009   Pneumonia    Rheumatoid arthritis(714.0)    Sepsis (HCC)    in the left ankle    Family History  Problem Relation Age of Onset   Cancer Mother    Hypertension Mother    Stroke Mother    Diabetes Mother    Cirrhosis Father    Lupus Sister    Lupus Sister    Breast cancer Sister    Past Surgical History:  Procedure Laterality Date   ANTERIOR CERVICAL DECOMP/DISCECTOMY FUSION N/A 11/14/2012   Procedure: ANTERIOR CERVICAL DECOMPRESSION/DISCECTOMY FUSION 1 LEVEL C5-6;  Surgeon: Donaciano Sprang, MD;  Location: [facility] OR;  Service: Orthopedics;  Laterality: N/A;   APPENDECTOMY  1970's   BACK SURGERY     CARDIAC CATHETERIZATION  May 2010   normal   I & D EXTREMITY Left 02/02/2022   Procedure: IRRIGATION AND DEBRIDEMENT LEFT FOOT/ANKLE;  Surgeon: Tobie Franky SQUIBB, DPM;  Location: [facility] OR;  Service: Podiatry;  Laterality: Left;   I & D EXTREMITY Left 03/23/2022   Procedure: IRRIGATION AND DEBRIDEMENT EXTREMITY;  Surgeon: Joya Stabs, DPM;  Location: [facility] OR;  Service: Podiatry;  Laterality: Left;   JOINT  REPLACEMENT     TONSILLECTOMY  1970's?   TOTAL HIP ARTHROPLASTY Right 12/13/2016   TOTAL HIP ARTHROPLASTY Right 12/13/2016   Procedure: RIGHT TOTAL HIP ARTHROPLASTY ANTERIOR APPROACH;  Surgeon: Vernetta Lonni GRADE, MD;  Location: [facility] OR;  Service: Orthopedics;  Laterality: Right;   Social History   Social History Narrative   Not on file   Immunization History  Administered Date(s) Administered   Influenza,inj,Quad PF,6+ Mos 08/08/2014   PFIZER(Purple Top)SARS-COV-2 Vaccination 10/11/2019, 11/01/2019   PPD Test 06/18/2018   Pneumococcal Polysaccharide-23 08/08/2014   Tdap 11/15/2016     Objective: Vital Signs: BP (!) 138/90   Pulse 81   Temp 98.4 F (36.9 C)   Resp 16   Ht 5' 10.5 (1.791 m)   Wt 160 lb 9.6 oz (72.8 kg)   BMI 22.72 kg/m    Physical Exam Eyes:     Conjunctiva/sclera: Conjunctivae normal.  Cardiovascular:     Rate and Rhythm: Normal rate and regular rhythm.  Pulmonary:     Effort: Pulmonary effort is normal.     Breath sounds: Normal breath sounds.  Musculoskeletal:     Right lower leg: No edema.     Left lower leg: No edema.  Lymphadenopathy:     Cervical: No cervical adenopathy.  Skin:    General: Skin is warm and dry.     Findings: No rash.  Neurological:     Mental Status: He is alert.  Psychiatric:        Mood and Affect: Mood normal.      Musculoskeletal Exam:  Shoulders stiffness in active movement, passive ROM is intact, no focal tenderness to pressure or swelling Left elbow firm, mobile nontender nodule in olecranon bursa, smaller nodule on proximal forearm Wrists with no swelling or tenderness, left wrist ulnar subluxation  Fingers full ROM no tenderness or swelling Knees full ROM no tenderness or swelling Left ankle no focal tenderness or palpable swelling  Investigation: No additional findings.  Imaging: No results found.  Recent Labs: Lab Results  Component Value Date   WBC 6.3  07/05/2024   HGB 9.9 (L) 07/05/2024    PLT 442 (H) 07/05/2024   NA 133 (L) 07/05/2024   K 4.6 07/05/2024   CL 102 07/05/2024   CO2 21 07/05/2024   GLUCOSE 78 07/05/2024   BUN 12 07/05/2024   CREATININE 0.98 07/05/2024   BILITOT 0.3 07/05/2024   ALKPHOS 142 (H) 07/11/2023   AST 33 07/05/2024   ALT 25 07/05/2024   PROT 8.2 (H) 07/05/2024   ALBUMIN 3.7 (L) 07/11/2023   CALCIUM  9.2 07/05/2024   GFRAA >60 11/18/2019   QFTBGOLDPLUS NEGATIVE 11/16/2023    Speciality Comments: No specialty comments available.  Procedures:  No procedures performed Allergies: Bee venom and Shellfish allergy   Assessment / Plan:     Visit Diagnoses: Rheumatoid arthritis involving multiple sites with positive rheumatoid factor (HCC) Rheumatoid arthritis well-managed with Actemra , methotrexate , and occasional prednisone . Intermittent hand swelling and difficulty curling fingers reported. Lab work normal, indicating good medication tolerance. - Continue Actemra  162 mg Churd[place] q14days - Continue methotrexate  25 mg Ellis weekly and folic acid  1 mg daily - Prednisone  5-10 mg PRN - Recheck inflammatory markers along with monitoring labs in three months.  High risk medication use On high-risk medications Actemra  and methotrexate , well-tolerated with no adverse effects. No serious interval infections. Recent labs from December CBC and CMP reviewed appropriate for continued Actemra  and methotrexate .  Orders: No orders of the defined types were placed in this encounter.  Meds ordered this encounter  Medications   folic acid  (FOLVITE ) 1 MG tablet    Sig: Take 1 tablet (1 mg total) by mouth daily.    Dispense:  90 tablet    Refill:  3     Follow-Up Instructions: Return in about 3 months (around 11/06/2024) for RA on TOC/MTX/GC f/u 3mos.   Lonni LELON Ester, MD  Note - This record has been created using [employer].  Chart creation errors have been sought, but may not always  have been located. Such creation errors do not reflect on  the  standard of medical care. "

## 2024-08-08 ENCOUNTER — Ambulatory Visit: Attending: Internal Medicine | Admitting: Internal Medicine

## 2024-08-08 ENCOUNTER — Other Ambulatory Visit: Payer: Self-pay

## 2024-08-08 ENCOUNTER — Other Ambulatory Visit (HOSPITAL_COMMUNITY): Payer: Self-pay

## 2024-08-08 ENCOUNTER — Encounter: Payer: Self-pay | Admitting: Internal Medicine

## 2024-08-08 VITALS — BP 138/90 | HR 81 | Temp 98.4°F | Resp 16 | Ht 70.5 in | Wt 160.6 lb

## 2024-08-08 DIAGNOSIS — Z79899 Other long term (current) drug therapy: Secondary | ICD-10-CM | POA: Insufficient documentation

## 2024-08-08 DIAGNOSIS — M069 Rheumatoid arthritis, unspecified: Secondary | ICD-10-CM | POA: Insufficient documentation

## 2024-08-08 DIAGNOSIS — M0579 Rheumatoid arthritis with rheumatoid factor of multiple sites without organ or systems involvement: Secondary | ICD-10-CM | POA: Insufficient documentation

## 2024-08-08 MED ORDER — FOLIC ACID 1 MG PO TABS
1.0000 mg | ORAL_TABLET | Freq: Every day | ORAL | 3 refills | Status: AC
  Filled 2024-08-08 (×2): qty 90, 90d supply, fill #0

## 2024-08-14 ENCOUNTER — Ambulatory Visit: Admitting: Physician Assistant

## 2024-09-18 ENCOUNTER — Ambulatory Visit (INDEPENDENT_AMBULATORY_CARE_PROVIDER_SITE_OTHER): Payer: Self-pay | Admitting: Primary Care

## 2024-11-07 ENCOUNTER — Ambulatory Visit: Admitting: Internal Medicine

## 2025-04-28 ENCOUNTER — Ambulatory Visit: Admitting: Physician Assistant
